# Patient Record
Sex: Female | Born: 1940 | Race: White | Hispanic: No | Smoking: Never smoker
Health system: Southern US, Community
[De-identification: ages and names within clinical notes are randomized; demographics above are authoritative.]

## PROBLEM LIST (undated history)

## (undated) DIAGNOSIS — M129 Arthropathy, unspecified: Secondary | ICD-10-CM

## (undated) DIAGNOSIS — H9190 Unspecified hearing loss, unspecified ear: Secondary | ICD-10-CM

## (undated) DIAGNOSIS — M810 Age-related osteoporosis without current pathological fracture: Secondary | ICD-10-CM

## (undated) DIAGNOSIS — I10 Essential (primary) hypertension: Secondary | ICD-10-CM

## (undated) DIAGNOSIS — F0280 Dementia in other diseases classified elsewhere without behavioral disturbance: Secondary | ICD-10-CM

## (undated) DIAGNOSIS — F32A Depression, unspecified: Secondary | ICD-10-CM

## (undated) DIAGNOSIS — J45909 Unspecified asthma, uncomplicated: Secondary | ICD-10-CM

## (undated) DIAGNOSIS — G309 Alzheimer's disease, unspecified: Secondary | ICD-10-CM

## (undated) DIAGNOSIS — Z973 Presence of spectacles and contact lenses: Secondary | ICD-10-CM

## (undated) DIAGNOSIS — E079 Disorder of thyroid, unspecified: Secondary | ICD-10-CM

## (undated) DIAGNOSIS — I2510 Atherosclerotic heart disease of native coronary artery without angina pectoris: Secondary | ICD-10-CM

## (undated) DIAGNOSIS — E119 Type 2 diabetes mellitus without complications: Secondary | ICD-10-CM

## (undated) DIAGNOSIS — I639 Cerebral infarction, unspecified: Secondary | ICD-10-CM

## (undated) DIAGNOSIS — J449 Chronic obstructive pulmonary disease, unspecified: Secondary | ICD-10-CM

## (undated) DIAGNOSIS — C801 Malignant (primary) neoplasm, unspecified: Secondary | ICD-10-CM

## (undated) HISTORY — PX: WRIST SURGERY: SHX841

## (undated) HISTORY — PX: KIDNEY SURGERY: SHX687

## (undated) HISTORY — PX: HX BACK SURGERY: SHX140

## (undated) HISTORY — PX: HX HYSTERECTOMY: SHX81

## (undated) HISTORY — PX: HX CORONARY ARTERY BYPASS GRAFT: SHX141

## (undated) HISTORY — PX: HX GALL BLADDER SURGERY/CHOLE: SHX55

## (undated) HISTORY — PX: LIVER RESECTION: SHX1977

## (undated) HISTORY — PX: HX CATARACT REMOVAL: SHX102

---

## 1999-05-21 ENCOUNTER — Other Ambulatory Visit (HOSPITAL_COMMUNITY): Payer: Self-pay

## 2002-04-27 ENCOUNTER — Ambulatory Visit (INDEPENDENT_AMBULATORY_CARE_PROVIDER_SITE_OTHER): Payer: Self-pay | Admitting: Rheumatology

## 2002-04-27 DIAGNOSIS — M810 Age-related osteoporosis without current pathological fracture: Secondary | ICD-10-CM | POA: Insufficient documentation

## 2005-08-18 ENCOUNTER — Emergency Department (HOSPITAL_COMMUNITY): Admission: EM | Admit: 2005-08-18 | Discharge: 2005-08-18 | Payer: Self-pay | Admitting: Emergency Medicine

## 2015-08-05 ENCOUNTER — Encounter: Payer: Self-pay | Admitting: *Deleted

## 2015-08-05 ENCOUNTER — Emergency Department
Admission: EM | Admit: 2015-08-05 | Discharge: 2015-08-05 | Disposition: A | Discharge status: Home / Self Care | Payer: Medicare Other | Attending: Emergency Medicine | Admitting: Emergency Medicine

## 2015-08-05 ENCOUNTER — Emergency Department: Payer: Medicare Other

## 2015-08-05 DIAGNOSIS — S20211A Contusion of right front wall of thorax, initial encounter: Secondary | ICD-10-CM | POA: Diagnosis not present

## 2015-08-05 DIAGNOSIS — Y998 Other external cause status: Secondary | ICD-10-CM | POA: Diagnosis not present

## 2015-08-05 DIAGNOSIS — Y9289 Other specified places as the place of occurrence of the external cause: Secondary | ICD-10-CM | POA: Insufficient documentation

## 2015-08-05 DIAGNOSIS — E1165 Type 2 diabetes mellitus with hyperglycemia: Secondary | ICD-10-CM | POA: Insufficient documentation

## 2015-08-05 DIAGNOSIS — Z8661 Personal history of infections of the central nervous system: Secondary | ICD-10-CM | POA: Insufficient documentation

## 2015-08-05 DIAGNOSIS — W01198A Fall on same level from slipping, tripping and stumbling with subsequent striking against other object, initial encounter: Secondary | ICD-10-CM | POA: Insufficient documentation

## 2015-08-05 DIAGNOSIS — R739 Hyperglycemia, unspecified: Secondary | ICD-10-CM

## 2015-08-05 DIAGNOSIS — Y9389 Activity, other specified: Secondary | ICD-10-CM | POA: Insufficient documentation

## 2015-08-05 DIAGNOSIS — S29001A Unspecified injury of muscle and tendon of front wall of thorax, initial encounter: Secondary | ICD-10-CM | POA: Diagnosis present

## 2015-08-05 LAB — COMPREHENSIVE METABOLIC PANEL
ALBUMIN: 3.6 g/dL (ref 3.5–5.0)
ALK PHOS: 77 U/L (ref 38–126)
ALT: 13 U/L — AB (ref 14–54)
AST: 16 U/L (ref 15–41)
Anion gap: 6 (ref 5–15)
BUN: 18 mg/dL (ref 6–20)
CALCIUM: 8.9 mg/dL (ref 8.9–10.3)
CO2: 27 mmol/L (ref 22–32)
CREATININE: 0.89 mg/dL (ref 0.44–1.00)
Chloride: 103 mmol/L (ref 101–111)
GFR calc non Af Amer: >60 mL/min (ref >60)
GLUCOSE: 503 mg/dL — AB (ref 65–99)
Potassium: 4.6 mmol/L (ref 3.5–5.1)
SODIUM: 136 mmol/L (ref 135–145)
Total Bilirubin: 0.4 mg/dL (ref 0.3–1.2)
Total Protein: 6.6 g/dL (ref 6.5–8.1)

## 2015-08-05 LAB — CBC
HEMATOCRIT: 35 % (ref 35.0–47.0)
HEMOGLOBIN: 11.4 g/dL — AB (ref 12.0–16.0)
MCH: 29.4 pg (ref 26.0–34.0)
MCHC: 32.7 g/dL (ref 32.0–36.0)
MCV: 89.9 fL (ref 80.0–100.0)
Platelets: 256 10*3/uL (ref 150–440)
RBC: 3.89 MIL/uL (ref 3.80–5.20)
RDW: 16.3 % — ABNORMAL HIGH (ref 11.5–14.5)
WBC: 4.1 10*3/uL (ref 3.6–11.0)

## 2015-08-05 LAB — TROPONIN I: Troponin I: <0.03 ng/mL (ref <0.031)

## 2015-08-05 MED ORDER — SODIUM CHLORIDE 0.9 % IV BOLUS (SEPSIS)
1000.0000 mL | Freq: Once | INTRAVENOUS | Status: AC
  Administered 2015-08-05: 1000 mL via INTRAVENOUS

## 2015-08-05 NOTE — ED Provider Notes (Signed)
[facility] Emergency Department Provider Note  ____________________________________________  Time seen: 7:30 PM  I have reviewed the triage vital signs and the nursing notes.  History by:  Patient along with her daughter.  HISTORY  Chief Complaint Shortness of Breath and Chest Pain     HPI Jenna Joseph is a 74 y.o. female who presents to the emergency department with complaint of some shortness of breath and discomfort in her right chest when she coughs or sneezes.  The patient reports that her symptoms began earlier today. Her daughter helps clarify that there was some discomfort yesterday as well. The patient tripped recently, 3 or 4 days ago, over an item and landed on her right side onto hard floor. She hit her head as well as her torso. She denies any loss of consciousness. She did not develop a headache. She has not had any acute changes, other than the current discomfort in right chest, in the 4 days since this happened.  The patient is primarily complaining of pain in the right lower chest. This is tender on palpation. Her daughter has blood to the area as well. There is a very small bruise in one area. The patient is on Eliquis due to a DVT after a prolonged hospitalization. The hospitalization was due to encephalopathy secondary to mosquito bite this past summer.  The patient has diabetes. She reports her sugar was high this evening and she gave herself her insulin dose around 5:30.  Lab results, which are a back, shows a glucose of 503.   Past medical history: Encephalopathy this past summer DVT during prolonged hospitalization  There are no active problems to display for this patient.   Surgical history: Cholecystectomy  No current outpatient prescriptions on file.  Allergies Ceclor; Macrodantin; and Sulfa antibiotics  No family history on file.  Social History Social History  Substance Use Topics  . Smoking status: Never Smoker   .  Smokeless tobacco: None  . Alcohol Use: No    Review of Systems  Constitutional: Negative for fever/chills. ENT: Negative for congestion. Cardiovascular: Negative for chest pain. Chest wall: Right-sided chest wall pain. See history of present illness Respiratory: Mild shortness of breath with chest discomfort on right. See history of present illness Gastrointestinal: Negative for abdominal pain, vomiting and diarrhea. Genitourinary: Negative for dysuria. Musculoskeletal: No back pain. Skin: Negative for rash. Neurological: Negative for headache or focal weakness. History of encephalopathy this past summer.   10-point ROS otherwise negative.  ____________________________________________   PHYSICAL EXAM:  VITAL SIGNS: ED Triage Vitals  Enc Vitals Group     BP 08/05/15 1812 124/42 mmHg     Pulse Rate 08/05/15 1812 74     Resp 08/05/15 1812 22     Temp 08/05/15 1812 98.6 F (37 C)     Temp Source 08/05/15 1812 Oral     SpO2 08/05/15 1812 99 %     Weight 08/05/15 1812 122 lb (55.339 kg)     Height 08/05/15 1812 5\' 4"  (1.626 m)     Head Cir --      Peak Flow --      Pain Score 08/05/15 1812 7     Pain Loc --      Pain Edu? --      Excl. in GC? --     Constitutional: Alert and oriented. Well appearing and in no distress. Communicative, but at times slightly tangential. ENT   Head: Normocephalic and atraumatic.   Nose: No congestion/rhinnorhea.  Mouth: No erythema, no swelling   Cardiovascular: Normal rate, regular rhythm, no murmur noted Chest wall: Notable point tenderness in the right lower ribs, in the lateral area. She also has some discomfort in the right anterior chest on palpation. Respiratory:  Normal respiratory effort, no tachypnea.    Breath sounds are clear and equal bilaterally.  Gastrointestinal: Soft, no distention. Nontender Back: No muscle spasm, no tenderness, no CVA tenderness. Musculoskeletal: No deformity noted. Nontender with normal  range of motion in all extremities.  No noted edema. Neurologic:  Communicative. Normal appearing spontaneous movement in all 4 extremities. No gross focal neurologic deficits are appreciated.  Skin:  Skin is warm, dry. No rash noted. Psychiatric: Mood and affect are normal. Speech and behavior are normal.  ____________________________________________    LABS (pertinent positives/negatives)  Labs Reviewed  CBC - Abnormal; Notable for the following:    Hemoglobin 11.4 (*)    RDW 16.3 (*)    All other components within normal limits  COMPREHENSIVE METABOLIC PANEL - Abnormal; Notable for the following:    Glucose, Bld 503 (*)    ALT 13 (*)    All other components within normal limits  TROPONIN I     ____________________________________________   EKG  ED ECG REPORT I, Sanjuan Sawa W, the attending physician, personally viewed and interpreted this ECG.   Date: 08/05/2015  EKG Time: 1826   Rate: 78  Rhythm:  Normal sinus rhythm  Axis: Normal  Intervals: Normal  ST&T Change: None noted   ____________________________________________    RADIOLOGY  Chest x-ray: FINDINGS: Median sternotomy. Heart size is normal. Negative for heart failure. Lungs are clear without infiltrate or effusion. No displaced rib fracture.  IMPRESSION: No active cardiopulmonary disease.   ____________________________________________  ____________________________________________   INITIAL IMPRESSION / ASSESSMENT AND PLAN / ED COURSE  Pertinent labs & imaging results that were available during my care of the patient were reviewed by me and considered in my medical decision making (see chart for details).  Pleasant alert 74 year old female in no acute distress. She complains of some discomfort when she coughs or sneezes. She also is tenderness on palpation of her right chest. I suspect this is due to the fall that occurred 4 days ago. Her chest x-ray is unremarkable with no rib fractures  noted and no pulmonary lesions noted. Her option saturation level and vital signs are excellent. Her lungs are clear to auscultation bilaterally. She is in no acute distress.  She does have an elevated glucose at 503. She gave herself her insulin at home prior to arrival. Her sugars apparently go up and down fairly rapidly. I do not think it's appropriate for rest to chase this further, except to give her 1 L of normal saline to help dilute down the sugar. I counseled her to continue with her insulin and to call her primary physician for further guidance in the next 1-2 days, depending on office hours.  The daughter with the patient is very pleasant, knowledgeable, and appreciative of the care provided and they both, the daughter and patient, agree with her current plan. We did discuss the pros and cons of getting a CT scan of her head. Given the fact that she had no loss of consciousness and no symptoms, 4 days later, I do not see an indication for a CT scan that would change the clinical outcome. I've also counseled the patient further on her use of Eliquis. I've asked her to follow with her regular doctor to determine  when is inappropriate stopping point given that the treatment was for a DVT that was experienced during a prolonged hospitalization.  ____________________________________________   FINAL CLINICAL IMPRESSION(S) / ED DIAGNOSES  Final diagnoses:  Chest wall contusion, right, initial encounter  Hyperglycemia      Darien Ramus, MD 08/05/15 2008

## 2015-08-05 NOTE — ED Notes (Addendum)
Pt ambulatory to triage.  Pt has sob and chest pain.  Sx for 1 day.  No n/v/d. No diaphoresis.  Pt fell 4 days ago after tripping over a bag onto the floor. No loc.  Pt has a bruise to right side of face. Pt is on a blood thinner.  Pt also reports pain in right ribs .  No bruising   States it hurts to take a deep breath.  Pt alert.  Speech clear.

## 2015-08-05 NOTE — Discharge Instructions (Signed)
Chest Contusion A chest contusion is a deep bruise on your chest area. Contusions are the result of an injury that caused bleeding under the skin. A chest contusion may involve bruising of the skin, muscles, or ribs. The contusion may turn blue, purple, or yellow. Minor injuries will give you a painless contusion, but more severe contusions may stay painful and swollen for a few weeks. CAUSES  A contusion is usually caused by a blow, trauma, or direct force to an area of the body. SYMPTOMS   Swelling and redness of the injured area.  Discoloration of the injured area.  Tenderness and soreness of the injured area.  Pain. DIAGNOSIS  The diagnosis can be made by taking a history and performing a physical exam. An X-ray, CT scan, or MRI may be needed to determine if there were any associated injuries, such as broken bones (fractures) or internal injuries. TREATMENT  Often, the best treatment for a chest contusion is resting, icing, and applying cold compresses to the injured area. Deep breathing exercises may be recommended to reduce the risk of pneumonia. Over-the-counter medicines may also be recommended for pain control. HOME CARE INSTRUCTIONS   Put ice on the injured area.  Put ice in a plastic bag.  Place a towel between your skin and the bag.  Leave the ice on for 15-20 minutes, 03-04 times a day.  Only take over-the-counter or prescription medicines as directed by your caregiver. Your caregiver may recommend avoiding anti-inflammatory medicines (aspirin, ibuprofen, and naproxen) for 48 hours because these medicines may increase bruising.  Rest the injured area.  Perform deep-breathing exercises as directed by your caregiver.  Stop smoking if you smoke.  Do not lift objects over 5 pounds (2.3 kg) for 3 days or longer if recommended by your caregiver. SEEK IMMEDIATE MEDICAL CARE IF:   You have increased bruising or swelling.  You have pain that is getting worse.  You have  difficulty breathing.  You have dizziness, weakness, or fainting.  You have blood in your urine or stool.  You cough up or vomit blood.  Your swelling or pain is not relieved with medicines. MAKE SURE YOU:   Understand these instructions.  Will watch your condition.  Will get help right away if you are not doing well or get worse.   This information is not intended to replace advice given to you by your health care provider. Make sure you discuss any questions you have with your health care provider.   Document Released: 04/22/2001 Document Revised: 04/21/2012 Document Reviewed: 01/19/2012 Elsevier Interactive Patient Education 2016 [employer]  

## 2015-08-05 NOTE — ED Notes (Signed)
Per dr Carollee Massedkaminski, no ct scan at this time.

## 2017-04-14 ENCOUNTER — Ambulatory Visit (HOSPITAL_COMMUNITY): Payer: Self-pay | Admitting: Gastroenterology

## 2017-07-17 IMAGING — CR DG CHEST 2V
2 series · 2 of 2 positions shown · non-contrast
Comparison: None.

CLINICAL DATA: Fall 07/23/2015.  Chest pain

EXAM:
CHEST  2 VIEW

[chest pa]
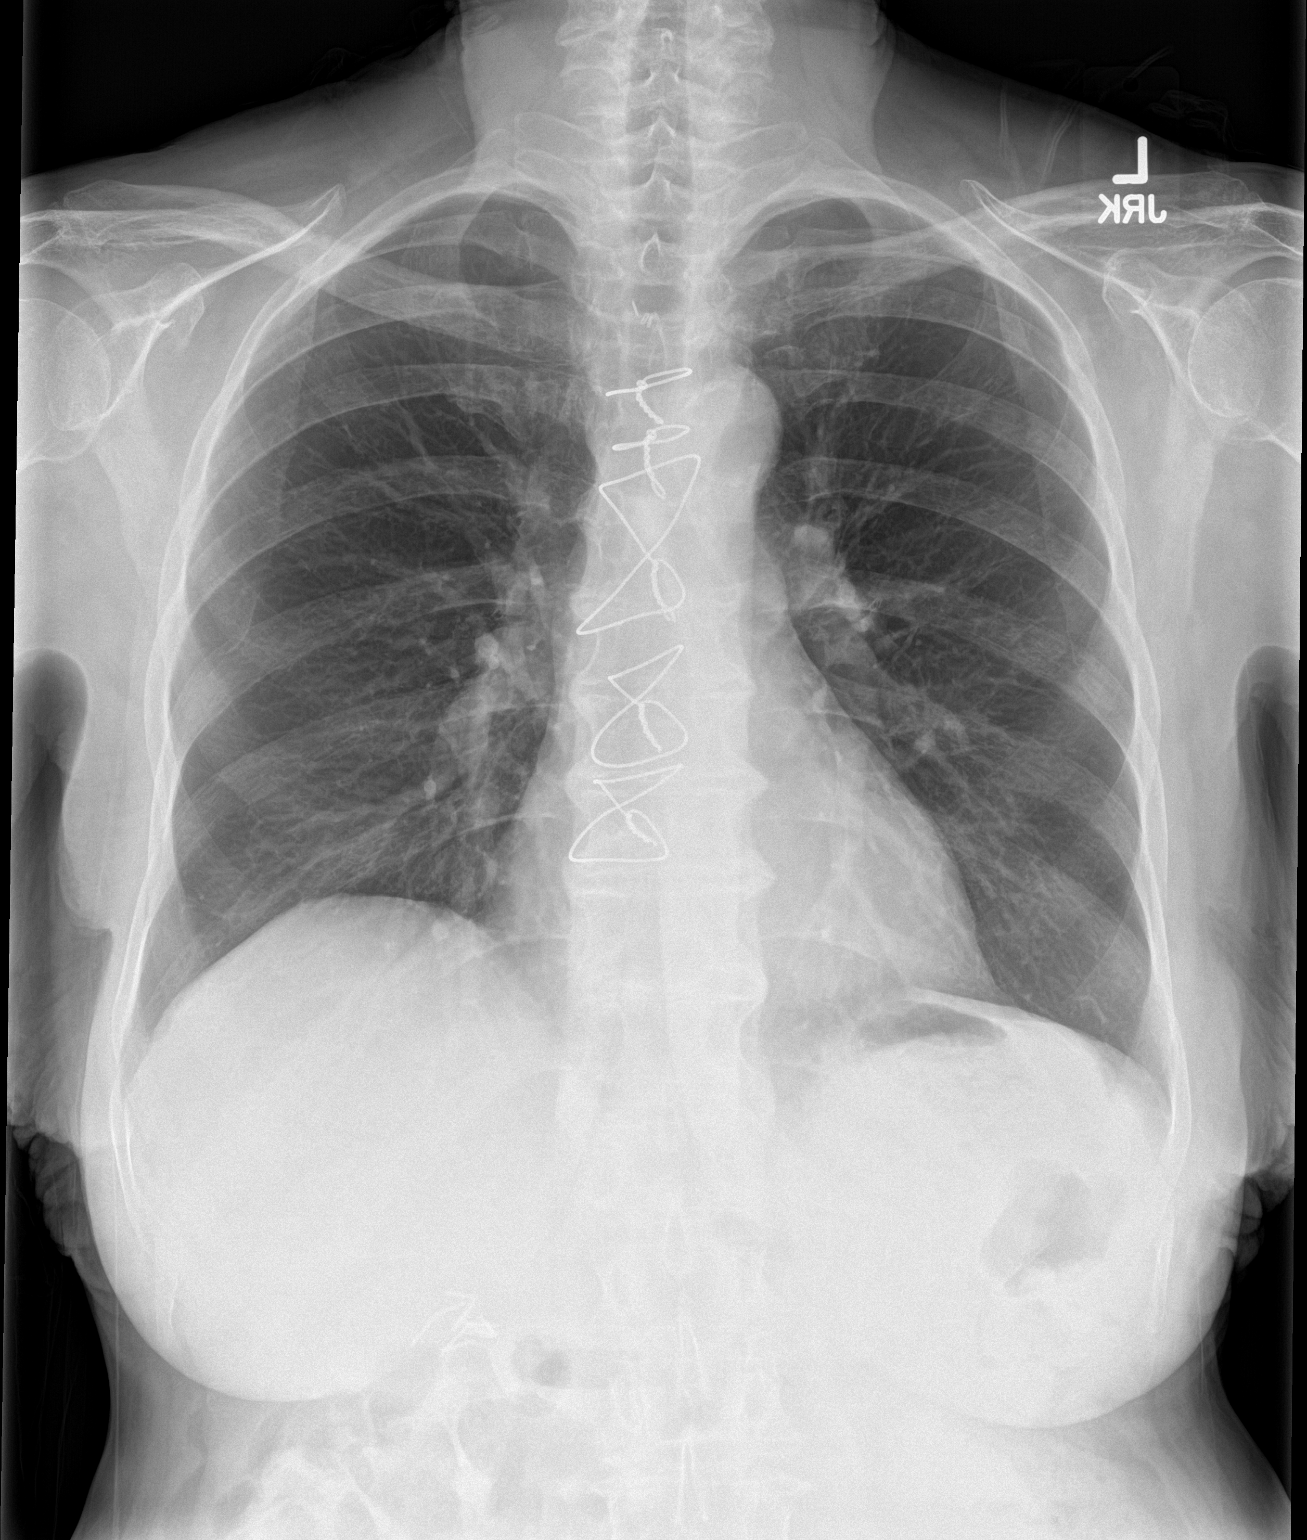

[chest lat]
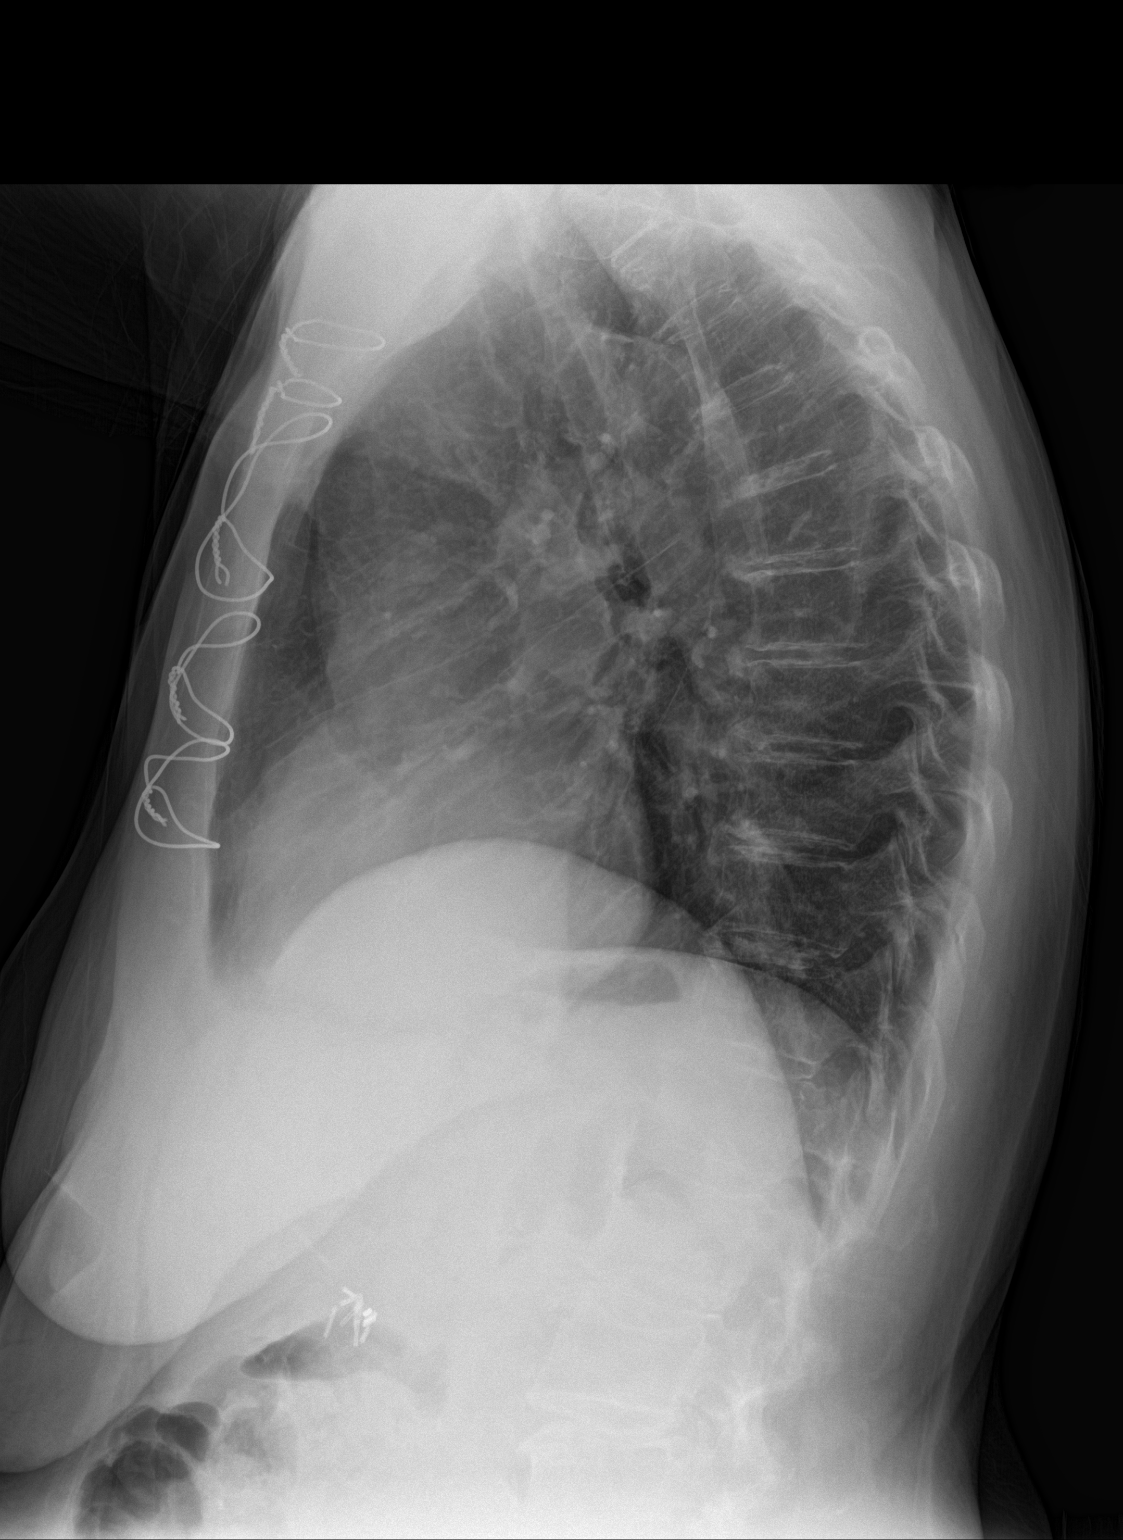

[2 of 2 positions shown; findings below may reference images not displayed]

FINDINGS: Median sternotomy. Heart size is normal. Negative for heart failure.
Lungs are clear without infiltrate or effusion. No displaced rib
fracture.
IMPRESSION: No active cardiopulmonary disease.

## 2020-10-31 DIAGNOSIS — E039 Hypothyroidism, unspecified: Secondary | ICD-10-CM | POA: Insufficient documentation

## 2020-10-31 DIAGNOSIS — E559 Vitamin D deficiency, unspecified: Secondary | ICD-10-CM | POA: Insufficient documentation

## 2020-10-31 DIAGNOSIS — E119 Type 2 diabetes mellitus without complications: Secondary | ICD-10-CM | POA: Insufficient documentation

## 2020-10-31 DIAGNOSIS — E1165 Type 2 diabetes mellitus with hyperglycemia: Secondary | ICD-10-CM | POA: Insufficient documentation

## 2021-10-29 ENCOUNTER — Encounter (HOSPITAL_BASED_OUTPATIENT_CLINIC_OR_DEPARTMENT_OTHER): Payer: Self-pay

## 2021-10-29 ENCOUNTER — Emergency Department (EMERGENCY_DEPARTMENT_HOSPITAL): Payer: Medicare Other

## 2021-10-29 ENCOUNTER — Emergency Department
Admission: EM | Admit: 2021-10-29 | Discharge: 2021-10-29 | Disposition: A | Discharge status: Home / Self Care | Payer: Medicare Other | Attending: Emergency Medicine | Admitting: Emergency Medicine

## 2021-10-29 ENCOUNTER — Other Ambulatory Visit: Payer: Self-pay

## 2021-10-29 DIAGNOSIS — M461 Sacroiliitis, not elsewhere classified: Secondary | ICD-10-CM | POA: Insufficient documentation

## 2021-10-29 DIAGNOSIS — M160 Bilateral primary osteoarthritis of hip: Secondary | ICD-10-CM

## 2021-10-29 DIAGNOSIS — M7918 Myalgia, other site: Secondary | ICD-10-CM

## 2021-10-29 DIAGNOSIS — M25552 Pain in left hip: Secondary | ICD-10-CM

## 2021-10-29 DIAGNOSIS — M8580 Other specified disorders of bone density and structure, unspecified site: Secondary | ICD-10-CM | POA: Insufficient documentation

## 2021-10-29 DIAGNOSIS — M169 Osteoarthritis of hip, unspecified: Secondary | ICD-10-CM

## 2021-10-29 HISTORY — DX: Alzheimer's disease, unspecified (CMS HCC): G30.9

## 2021-10-29 HISTORY — DX: Cerebral infarction, unspecified: I63.9

## 2021-10-29 HISTORY — DX: Dementia in other diseases classified elsewhere, unspecified severity, without behavioral disturbance, psychotic disturbance, mood disturbance, and anxiety: F02.80

## 2021-10-29 HISTORY — DX: Unspecified hearing loss, unspecified ear: H91.90

## 2021-10-29 HISTORY — DX: Atherosclerotic heart disease of native coronary artery without angina pectoris: I25.10

## 2021-10-29 HISTORY — DX: Type 2 diabetes mellitus without complications: E11.9

## 2021-10-29 LAB — URINALYSIS, MACRO/MICRO
BILIRUBIN: NEGATIVE mg/dL
BLOOD: NEGATIVE mg/dL
GLUCOSE: NEGATIVE mg/dL
KETONES: NEGATIVE mg/dL
LEUKOCYTES: NEGATIVE WBCs/uL
NITRITE: NEGATIVE
PH: 7.5 (ref 4.6–8.0)
PROTEIN: NEGATIVE mg/dL
SPECIFIC GRAVITY: 1.01 (ref 1.003–1.035)
UROBILINOGEN: 0.2 mg/dL (ref 0.2–1.0)

## 2021-10-29 MED ORDER — LIDOCAINE 4 % TOPICAL PATCH
1.0000 | MEDICATED_PATCH | CUTANEOUS | Status: DC
Start: 2021-10-29 — End: 2021-10-29
  Administered 2021-10-29: 1 via TRANSDERMAL

## 2021-10-29 MED ORDER — NAPROXEN 500 MG TABLET
500.0000 mg | ORAL_TABLET | Freq: Two times a day (BID) | ORAL | 0 refills | Status: DC
Start: 2021-10-29 — End: 2021-12-16

## 2021-10-29 MED ORDER — MORPHINE 4 MG/ML INJECTION WRAPPER
INJECTION | INTRAMUSCULAR | Status: AC
Start: 2021-10-29 — End: 2021-10-29
  Filled 2021-10-29: qty 1

## 2021-10-29 MED ORDER — LIDOCAINE 4 % TOPICAL PATCH
MEDICATED_PATCH | CUTANEOUS | Status: AC
Start: 2021-10-29 — End: 2021-10-29
  Filled 2021-10-29: qty 1

## 2021-10-29 MED ORDER — PREDNISONE 50 MG TABLET
50.0000 mg | ORAL_TABLET | Freq: Every day | ORAL | 0 refills | Status: DC
Start: 2021-10-29 — End: 2021-11-27

## 2021-10-29 MED ORDER — MORPHINE 4 MG/ML INJECTION WRAPPER
4.0000 mg | INJECTION | INTRAMUSCULAR | Status: AC
Start: 2021-10-29 — End: 2021-10-29
  Administered 2021-10-29: 4 mg via INTRAMUSCULAR

## 2021-10-29 NOTE — ED Nurses Note (Signed)
Pt reports and points to left buttocks when describing pain. 10/10. States she did not fall. Reports she had x-ray at [facility] a "few" days ago and it showed arthritis.

## 2021-10-29 NOTE — ED Attending Note (Signed)
[place] emergency department         HISTORY OF PRESENT ILLNESS     Date:  10/29/2021  Patient's Name:  Jordan Buckley  Date of Birth:  09-14-1940    Patient is a 81 year old presenting with left buttock left hip pain onset over the last week.  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  Patient denies any recent fall or injury.  Patient with severe pain involving the left buttock left hip area radiating down her left leg.  No fever no chills.  There has been no nausea or vomiting no dysuria          Review of Systems     Review of Systems   Constitutional: Negative.    HENT: Negative.    Eyes: Negative.    Respiratory: Negative.    Cardiovascular: Negative.    Musculoskeletal: Positive for back pain.   Skin: Negative.    Neurological: Negative.    Hematological: Negative.    Psychiatric/Behavioral: Negative.        Previous History     Past Medical History:  Past Medical History:   Diagnosis Date   . Alzheimer's dementia (CMS [place])    . Coronary artery disease    . CVA (cerebrovascular accident) (CMS [place])    . Diabetes mellitus, type 2 (CMS HCC)    . Hard of hearing        Past Surgical History:  No past surgical history on file.    Social History:     Social History     Substance and Sexual Activity   Drug Use Not on file       Family History:  No family history on file.    Medication History:  Current Outpatient Medications   Medication Sig   . naproxen (NAPROSYN) 500 mg Oral Tablet Take 1 Tablet (500 mg total) by mouth Twice daily with food   . predniSONE (DELTASONE) 50 mg Oral Tablet Take 1 Tablet (50 mg total) by mouth Once a day for 5 days       Allergies:  Allergies   Allergen Reactions   . Cefaclor Rash   . Nitrofurantoin Rash   . Quinine Rash   . Sulfa (Sulfonamides) Rash       Physical Exam     Vitals:    BP 139/89   Pulse 63   Temp 36.6 C (97.9 F)   Resp 17   Ht 1.626 m ('5\' 4"'$ )   Wt 61.2 kg (135 lb)   SpO2 93%   BMI 23.17  kg/m           Physical Exam  Vitals and nursing note reviewed.   Constitutional:       General: She is not in acute distress.     Appearance: She is well-developed.   HENT:      Head: Normocephalic and atraumatic.   Eyes:      Conjunctiva/sclera: Conjunctivae normal.   Cardiovascular:      Rate and Rhythm: Normal rate and regular rhythm.      Heart sounds: No murmur heard.  Pulmonary:      Effort: Pulmonary effort is normal. No respiratory distress.      Breath sounds: Normal breath sounds.   Abdominal:      Palpations: Abdomen is soft.      Tenderness: There is no abdominal tenderness.   Musculoskeletal:         General:  No swelling. Normal range of motion.      Cervical back: Neck supple.      Comments: Tenderness left buttock with full range of motion left hip   Skin:     General: Skin is warm and dry.      Capillary Refill: Capillary refill takes less than 2 seconds.   Neurological:      Mental Status: She is alert.   Psychiatric:         Mood and Affect: Mood normal.         Diagnostic Studies/Treatment     Medications:  Medications Administered in the ED   lidocaine 4% patch (1 Patch Transdermal Patch Applied 10/29/21 0429)   morphine 4 mg/mL injection (4 mg IntraMUSCULAR Given 10/29/21 0430)       New Prescriptions    NAPROXEN (NAPROSYN) 500 MG ORAL TABLET    Take 1 Tablet (500 mg total) by mouth Twice daily with food    PREDNISONE (DELTASONE) 50 MG ORAL TABLET    Take 1 Tablet (50 mg total) by mouth Once a day for 5 days       Labs:    Results for orders placed or performed during the hospital encounter of 10/29/21 (from the past 12 hour(s))   URINALYSIS, MACRO/MICRO   Result Value Ref Range    COLOR Light Yellow Yellow, Colorless, Light Yellow, Dark Yellow    APPEARANCE Clear Clear    SPECIFIC GRAVITY 1.010 1.003 - 1.035    PH 7.5 4.6 - 8.0    LEUKOCYTES Negative Negative WBCs/uL    NITRITE Negative Negative    PROTEIN Negative Negative mg/dL    GLUCOSE Negative Negative mg/dL    KETONES Negative Negative  mg/dL    BILIRUBIN Negative Negative mg/dL    BLOOD Negative Negative mg/dL    UROBILINOGEN 0.2 0.2 - 1.0 mg/dL        Radiology:  CT PELVIS WO IV CONTRAST  No fracture dislocation degenerative changes bilateral hips  CT PELVIS WO IV CONTRAST   Final Result          ECG:  NONE            Differential diagnosis  Left hip bursitis, contusion left hip, degenerative arthritis pelvis    Course/Disposition/Plan     Course:     patient pain much improved after IM morphine.  Patient will be discharged home she will need follow-up with orthopedics if discomfort continues    Disposition:    Discharged    Condition at Disposition:      Stable  Follow up:   Lerry Paterson, [address]  [place] [id number]  [phone]    Schedule an appointment as soon as possible for a visit in 2 days  If symptoms worsen      Clinical Impression:     Clinical Impression   Sacroiliitis (CMS [place]) (Primary)   Degenerative arthritis of hip         Winfred Burn, MD

## 2021-10-29 NOTE — ED Nurses Note (Signed)
Pt ambulated to restroom with assistance, unsteady gait noted, pt reports increased left buttock/hip pain with ambulation

## 2021-10-29 NOTE — ED Nurses Note (Signed)
Rx called into [facility] per pt wrong pharmacy choose daughter informed also pt wears O2 at 2 prn, her sat was good enough on room air, to go home without O2.

## 2021-10-29 NOTE — ED Triage Notes (Signed)
Per EMS, patient c/o left hip pain for the past 2 days. Patient denies injury. Patient reports that she was unable to bear weight on the leg this morning.

## 2021-11-12 ENCOUNTER — Other Ambulatory Visit (HOSPITAL_COMMUNITY): Payer: Self-pay | Admitting: NURSE PRACTITIONER

## 2021-11-12 DIAGNOSIS — M545 Low back pain, unspecified: Secondary | ICD-10-CM

## 2021-11-19 ENCOUNTER — Inpatient Hospital Stay
Admission: RE | Admit: 2021-11-19 | Discharge: 2021-11-19 | Disposition: A | Discharge status: Home / Self Care | Payer: Medicare Other | Source: Ambulatory Visit | Attending: NURSE PRACTITIONER | Admitting: NURSE PRACTITIONER

## 2021-11-19 ENCOUNTER — Other Ambulatory Visit: Payer: Self-pay

## 2021-11-19 DIAGNOSIS — M545 Low back pain, unspecified: Secondary | ICD-10-CM | POA: Insufficient documentation

## 2021-11-21 ENCOUNTER — Other Ambulatory Visit (HOSPITAL_COMMUNITY): Payer: Self-pay | Admitting: NURSE PRACTITIONER

## 2021-11-21 DIAGNOSIS — S22080A Wedge compression fracture of T11-T12 vertebra, initial encounter for closed fracture: Secondary | ICD-10-CM

## 2021-11-26 ENCOUNTER — Other Ambulatory Visit (HOSPITAL_COMMUNITY): Payer: Self-pay | Admitting: NURSE PRACTITIONER

## 2021-11-26 DIAGNOSIS — M8008XA Age-related osteoporosis with current pathological fracture, vertebra(e), initial encounter for fracture: Secondary | ICD-10-CM

## 2021-11-26 DIAGNOSIS — S22080A Wedge compression fracture of T11-T12 vertebra, initial encounter for closed fracture: Secondary | ICD-10-CM

## 2021-11-27 ENCOUNTER — Other Ambulatory Visit: Payer: Self-pay

## 2021-11-27 ENCOUNTER — Encounter (HOSPITAL_COMMUNITY): Payer: Self-pay

## 2021-11-27 ENCOUNTER — Inpatient Hospital Stay
Admission: RE | Admit: 2021-11-27 | Discharge: 2021-11-27 | Disposition: A | Discharge status: Home / Self Care | Payer: Medicare Other | Source: Ambulatory Visit | Attending: NURSE PRACTITIONER | Admitting: NURSE PRACTITIONER

## 2021-11-27 DIAGNOSIS — M8008XA Age-related osteoporosis with current pathological fracture, vertebra(e), initial encounter for fracture: Secondary | ICD-10-CM | POA: Insufficient documentation

## 2021-11-27 DIAGNOSIS — S22080A Wedge compression fracture of T11-T12 vertebra, initial encounter for closed fracture: Secondary | ICD-10-CM | POA: Insufficient documentation

## 2021-11-27 HISTORY — PX: FIXATION KYPHOPLASTY: SHX860

## 2021-11-27 HISTORY — DX: Age-related osteoporosis without current pathological fracture: M81.0

## 2021-11-27 MED ORDER — LEVOFLOXACIN 500 MG/100 ML IN 5 % DEXTROSE INTRAVENOUS PIGGYBACK
INJECTION | INTRAVENOUS | Status: AC
Start: 2021-11-27 — End: 2021-11-27
  Filled 2021-11-27: qty 100

## 2021-11-27 MED ORDER — IOHEXOL 300 MG IODINE/ML INTRAVENOUS SOLUTION
50.0000 mL | INTRAVENOUS | Status: AC
Start: 2021-11-27 — End: 2021-11-27
  Administered 2021-11-27: 50 mL via INTRAMUSCULAR

## 2021-11-27 MED ORDER — HYDROCODONE 7.5 MG-ACETAMINOPHEN 325 MG TABLET
1.0000 | ORAL_TABLET | Freq: Four times a day (QID) | ORAL | Status: DC | PRN
Start: 2021-11-27 — End: 2021-11-28
  Administered 2021-11-27: 1 via ORAL
  Filled 2021-11-27: qty 1

## 2021-11-27 MED ORDER — MIDAZOLAM 5 MG/ML INJECTION WRAPPER
Freq: Once | INTRAMUSCULAR | Status: AC | PRN
Start: 2021-11-27 — End: 2021-11-27
  Administered 2021-11-27 (×2): 1 mg via INTRAVENOUS

## 2021-11-27 MED ORDER — MIDAZOLAM 5 MG/ML INJECTION WRAPPER
INTRAMUSCULAR | Status: AC
Start: 2021-11-27 — End: 2021-11-27
  Filled 2021-11-27: qty 2

## 2021-11-27 MED ORDER — LIDOCAINE (PF) 20 MG/ML (2 %) INJECTION SOLUTION
INTRAMUSCULAR | Status: AC
Start: 2021-11-27 — End: 2021-11-27
  Filled 2021-11-27: qty 10

## 2021-11-27 MED ORDER — FENTANYL (PF) 50 MCG/ML INJECTION WRAPPER
INJECTION | Freq: Once | INTRAMUSCULAR | Status: AC | PRN
Start: 2021-11-27 — End: 2021-11-27
  Administered 2021-11-27 (×4): 20 ug via INTRAVENOUS

## 2021-11-27 MED ORDER — LEVOFLOXACIN 500 MG/100 ML IN 5 % DEXTROSE INTRAVENOUS PIGGYBACK
500.0000 mg | INJECTION | INTRAVENOUS | Status: DC
Start: 2021-11-27 — End: 2021-11-28
  Administered 2021-11-27: 500 mg via INTRAVENOUS

## 2021-11-27 MED ORDER — FENTANYL (PF) 50 MCG/ML INJECTION SOLUTION
INTRAMUSCULAR | Status: AC
Start: 2021-11-27 — End: 2021-11-27
  Filled 2021-11-27: qty 4

## 2021-11-27 MED ORDER — DEXTROSE 5 % IN WATER (D5W) INTRAVENOUS SOLUTION
INTRAVENOUS | Status: DC
Start: 2021-11-27 — End: 2021-11-28

## 2021-11-27 MED ORDER — SODIUM CHLORIDE 0.9 % INTRAVENOUS SOLUTION
INTRAVENOUS | Status: DC
Start: 2021-11-27 — End: 2021-11-27

## 2021-11-27 MED ORDER — LIDOCAINE HCL 20 MG/ML (2 %) INJECTION SOLUTION
Freq: Once | INTRAMUSCULAR | Status: AC | PRN
Start: 2021-11-27 — End: 2021-11-27
  Administered 2021-11-27: 10 mL via INTRADERMAL

## 2021-11-27 MED ORDER — MIDAZOLAM 5 MG/ML INJECTION WRAPPER
Freq: Once | INTRAMUSCULAR | Status: AC | PRN
Start: 2021-11-27 — End: 2021-11-27
  Administered 2021-11-27: 1 mg via NASAL

## 2021-11-27 NOTE — Nurses Notes (Signed)
1400 Dr Harolyn Rutherford came to the floor. The pt got up with her walker and  ambulated down the hallway. She ambulated without any difficulty. He stated that she could be discharged to home and that she can resume all of her home medicine. States she has no pain in her back. States all of her pain is in her right shoulder. States nothing helps it. States she is supposed to have surgery.

## 2021-11-27 NOTE — Brief Op Note (Signed)
11/27/21      Radiologist: Dr. 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

## 2021-11-27 NOTE — OR PostOp (Signed)
1135 Denies any numbness or tingilng. Feet cool to touch. Pt and daughters state that she is always cold. Warm blankets placed on pt. Strong pedal pulses palpated. States her pain is in her shoulder. States she has a torn rotator cuff and her pain is from it. States nothing helps.Radial pulse palpated.  1345 When questioned states the pain pill did not help the shoulder pain at all.

## 2021-11-27 NOTE — Discharge Instructions (Signed)
Follow up with your medical dr    If any questions or concerns call your medical Dr    Resume home medicines as before surgery    Watch for signs of infection, temp greater than 100.4, any foul smell, any unusual color drainage    Do not do any heavy lifting    Do not bend

## 2021-11-28 LAB — SURGICAL PATHOLOGY SPECIMEN

## 2021-12-03 ENCOUNTER — Other Ambulatory Visit: Payer: Self-pay

## 2021-12-03 ENCOUNTER — Emergency Department
Admission: EM | Admit: 2021-12-03 | Discharge: 2021-12-03 | Disposition: A | Discharge status: Home / Self Care | Payer: Medicare Other | Attending: Physician Assistant | Admitting: Physician Assistant

## 2021-12-03 ENCOUNTER — Emergency Department (HOSPITAL_COMMUNITY): Payer: Medicare Other

## 2021-12-03 ENCOUNTER — Encounter (HOSPITAL_COMMUNITY): Payer: Self-pay

## 2021-12-03 DIAGNOSIS — R1084 Generalized abdominal pain: Secondary | ICD-10-CM | POA: Insufficient documentation

## 2021-12-03 DIAGNOSIS — J449 Chronic obstructive pulmonary disease, unspecified: Secondary | ICD-10-CM | POA: Insufficient documentation

## 2021-12-03 DIAGNOSIS — Z8673 Personal history of transient ischemic attack (TIA), and cerebral infarction without residual deficits: Secondary | ICD-10-CM | POA: Insufficient documentation

## 2021-12-03 DIAGNOSIS — K529 Noninfective gastroenteritis and colitis, unspecified: Secondary | ICD-10-CM | POA: Insufficient documentation

## 2021-12-03 DIAGNOSIS — Z9981 Dependence on supplemental oxygen: Secondary | ICD-10-CM | POA: Insufficient documentation

## 2021-12-03 DIAGNOSIS — G309 Alzheimer's disease, unspecified: Secondary | ICD-10-CM | POA: Insufficient documentation

## 2021-12-03 DIAGNOSIS — R110 Nausea: Secondary | ICD-10-CM | POA: Insufficient documentation

## 2021-12-03 DIAGNOSIS — E119 Type 2 diabetes mellitus without complications: Secondary | ICD-10-CM | POA: Insufficient documentation

## 2021-12-03 DIAGNOSIS — R109 Unspecified abdominal pain: Secondary | ICD-10-CM

## 2021-12-03 DIAGNOSIS — F0280 Dementia in other diseases classified elsewhere without behavioral disturbance: Secondary | ICD-10-CM | POA: Insufficient documentation

## 2021-12-03 DIAGNOSIS — I729 Aneurysm of unspecified site: Secondary | ICD-10-CM | POA: Insufficient documentation

## 2021-12-03 HISTORY — DX: Presence of spectacles and contact lenses: Z97.3

## 2021-12-03 HISTORY — DX: Malignant (primary) neoplasm, unspecified: C80.1

## 2021-12-03 HISTORY — DX: Disorder of thyroid, unspecified: E07.9

## 2021-12-03 HISTORY — DX: Essential (primary) hypertension: I10

## 2021-12-03 HISTORY — DX: Arthropathy, unspecified: M12.9

## 2021-12-03 LAB — COMPREHENSIVE METABOLIC PANEL, NON-FASTING
ALBUMIN/GLOBULIN RATIO: 1.4 (ref 0.8–1.4)
ALBUMIN: 3.8 g/dL (ref 3.5–5.7)
ALKALINE PHOSPHATASE: 56 U/L (ref 34–104)
ALT (SGPT): 7 U/L (ref 7–52)
ANION GAP: 7 mmol/L — ABNORMAL LOW (ref 10–20)
AST (SGOT): 11 U/L — ABNORMAL LOW (ref 13–39)
BILIRUBIN TOTAL: 0.5 mg/dL (ref 0.3–1.2)
BUN/CREA RATIO: 25 — ABNORMAL HIGH (ref 6–22)
BUN: 22 mg/dL (ref 7–25)
CALCIUM, CORRECTED: 9.3 mg/dL (ref 8.9–10.8)
CALCIUM: 9.1 mg/dL (ref 8.6–10.3)
CHLORIDE: 100 mmol/L (ref 98–107)
CO2 TOTAL: 29 mmol/L (ref 21–31)
CREATININE: 0.87 mg/dL (ref 0.60–1.30)
ESTIMATED GFR: 67 mL/min/{1.73_m2} (ref >59)
GLOBULIN: 2.8 — ABNORMAL LOW (ref 2.9–5.4)
GLUCOSE: 263 mg/dL — ABNORMAL HIGH (ref 74–109)
OSMOLALITY, CALCULATED: 285 mOsm/kg (ref 270–290)
POTASSIUM: 4.2 mmol/L (ref 3.5–5.1)
PROTEIN TOTAL: 6.6 g/dL (ref 6.4–8.9)
SODIUM: 136 mmol/L (ref 136–145)

## 2021-12-03 LAB — URINALYSIS, MACROSCOPIC
BILIRUBIN: NEGATIVE mg/dL
BLOOD: NEGATIVE mg/dL
GLUCOSE: 500 mg/dL — AB
KETONES: NEGATIVE mg/dL
LEUKOCYTES: NEGATIVE WBCs/uL
NITRITE: NEGATIVE
PH: 7.5 (ref 5.0–9.0)
PROTEIN: NEGATIVE mg/dL
SPECIFIC GRAVITY: 1.013 (ref 1.002–1.030)
UROBILINOGEN: NORMAL mg/dL

## 2021-12-03 LAB — CBC WITH DIFF
BASOPHIL #: 0.1 10*3/uL (ref 0.00–0.30)
BASOPHIL %: 1 % (ref 0–3)
EOSINOPHIL #: 0.2 10*3/uL (ref 0.00–0.80)
EOSINOPHIL %: 3 % (ref 0–7)
HCT: 33.5 % — ABNORMAL LOW (ref 37.0–47.0)
HGB: 11.4 g/dL — ABNORMAL LOW (ref 12.5–16.0)
LYMPHOCYTE #: 1.1 10*3/uL (ref 1.10–5.00)
LYMPHOCYTE %: 18 % — ABNORMAL LOW (ref 25–45)
MCH: 30.8 pg (ref 27.0–32.0)
MCHC: 34.1 g/dL (ref 32.0–36.0)
MCV: 90.5 fL (ref 78.0–99.0)
MONOCYTE #: 0.3 10*3/uL (ref 0.00–1.30)
MONOCYTE %: 5 % (ref 0–12)
MPV: 6.4 fL — ABNORMAL LOW (ref 7.4–10.4)
NEUTROPHIL #: 4.3 10*3/uL (ref 1.80–8.40)
NEUTROPHIL %: 73 % (ref 40–76)
PLATELETS: 363 10*3/uL (ref 140–440)
RBC: 3.71 10*6/uL — ABNORMAL LOW (ref 4.20–5.40)
RDW: 13.9 % (ref 11.6–14.8)
WBC: 5.8 10*3/uL (ref 4.0–10.5)
WBCS UNCORRECTED: 5.8 10*3/uL

## 2021-12-03 LAB — URINALYSIS, MICROSCOPIC
RBCS: 5 /hpf — ABNORMAL HIGH (ref <4)
SQUAMOUS EPITHELIAL: 1 /hpf (ref <28)
WBCS: 1 /hpf (ref <6)

## 2021-12-03 LAB — C-REACTIVE PROTEIN (CRP): C-REACTIVE PROTEIN (CRP): 0.5 mg/dL (ref 0.1–0.5)

## 2021-12-03 LAB — LACTIC ACID LEVEL W/ REFLEX FOR LEVEL >2.0: LACTIC ACID: 1.1 mmol/L (ref 0.5–2.2)

## 2021-12-03 MED ORDER — ONDANSETRON 4 MG DISINTEGRATING TABLET
4.0000 mg | ORAL_TABLET | ORAL | Status: AC
Start: 2021-12-03 — End: 2021-12-03
  Administered 2021-12-03: 4 mg via ORAL

## 2021-12-03 MED ORDER — KETOROLAC 30 MG/ML (1 ML) INJECTION SOLUTION
INTRAMUSCULAR | Status: AC
Start: 2021-12-03 — End: 2021-12-03
  Filled 2021-12-03: qty 1

## 2021-12-03 MED ORDER — ONDANSETRON 4 MG DISINTEGRATING TABLET
ORAL_TABLET | ORAL | Status: AC
Start: 2021-12-03 — End: 2021-12-03
  Filled 2021-12-03: qty 1

## 2021-12-03 MED ORDER — ONDANSETRON 4 MG DISINTEGRATING TABLET
4.0000 mg | ORAL_TABLET | Freq: Three times a day (TID) | ORAL | 0 refills | Status: AC | PRN
Start: 2021-12-03 — End: ?

## 2021-12-03 MED ORDER — AMOXICILLIN 875 MG-POTASSIUM CLAVULANATE 125 MG TABLET
1.0000 | ORAL_TABLET | Freq: Two times a day (BID) | ORAL | 0 refills | Status: DC
Start: 2021-12-03 — End: 2021-12-16

## 2021-12-03 MED ORDER — KETOROLAC 30 MG/ML (1 ML) INJECTION SOLUTION
15.0000 mg | INTRAMUSCULAR | Status: AC
Start: 2021-12-03 — End: 2021-12-03
  Administered 2021-12-03: 15 mg via INTRAMUSCULAR

## 2021-12-03 NOTE — ED APP Handoff Note (Signed)
[facility]  Emergency Department  Course Note    Care/report received from [facility] at  06:10.  Per report:  Jordan Buckley is a 81 y.o. female who had concerns including Abdominal Pain, Back Pain, and Urinary Pain.  Patient presented to the emergency room for evaluation of abdominal pain radiating to the back.     Patient is an 81 year old female past medical history of CVA Alzheimer's type 2 diabetes mellitus presents to the ED with complaints of lower abdominal pain.  Jordan Buckley is accompanied by daughter who contributes to the history.  States he has been having nausea and diarrhea since Saturday eating at the [employer].  Jordan Buckley denies fevers or chills Jordan Buckley does endorse dysuria.  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      Pending labs/imaging/consults:   Results for orders placed or performed during the hospital encounter of 12/03/21 (from the past 12 hour(s))   COMPREHENSIVE METABOLIC PANEL, NON-FASTING   Result Value Ref Range    SODIUM 136 136 - 145 mmol/L    POTASSIUM 4.2 3.5 - 5.1 mmol/L    CHLORIDE 100 98 - 107 mmol/L    CO2 TOTAL 29 21 - 31 mmol/L    ANION GAP 7 (L) 10 - 20 mmol/L    BUN 22 7 - 25 mg/dL    CREATININE 0.87 0.60 - 1.30 mg/dL    BUN/CREA RATIO 25 (H) 6 - 22    ESTIMATED GFR 67 >59 mL/min/1.56m2    ALBUMIN 3.8 3.5 - 5.7 g/dL    CALCIUM 9.1 8.6 - 10.3 mg/dL    GLUCOSE 263 (H) 74 - 109 mg/dL    ALKALINE PHOSPHATASE 56 34 - 104 U/L    ALT (SGPT) 7 7 - 52 U/L    AST (SGOT) 11 (L) 13 - 39 U/L    BILIRUBIN TOTAL 0.5 0.3 - 1.2 mg/dL    PROTEIN TOTAL 6.6 6.4 - 8.9 g/dL    ALBUMIN/GLOBULIN RATIO 1.4 0.8 - 1.4    OSMOLALITY, CALCULATED 285 270 - 290 mOsm/kg    CALCIUM, CORRECTED 9.3 8.9 - 10.8 mg/dL    GLOBULIN 2.8 (L) 2.9 - 5.4   C-REACTIVE PROTEIN (CRP)   Result Value Ref Range    C-REACTIVE PROTEIN (CRP) 0.5 0.1 - 0.5 mg/dL   LACTIC ACID LEVEL W/ REFLEX FOR LEVEL >2.0   Result Value Ref Range    LACTIC ACID 1.1 0.5 - 2.2  mmol/L   CBC WITH DIFF   Result Value Ref Range    WBCS UNCORRECTED 5.8 x10^3/uL    WBC 5.8 4.0 - 10.5 x10^3/uL    RBC 3.71 (L) 4.20 - 5.40 x10^6/uL    HGB 11.4 (L) 12.5 - 16.0 g/dL    HCT 33.5 (L) 37.0 - 47.0 %    MCV 90.5 78.0 - 99.0 fL    MCH 30.8 27.0 - 32.0 pg    MCHC 34.1 32.0 - 36.0 g/dL    RDW 13.9 11.6 - 14.8 %    PLATELETS 363 140 - 440 x10^3/uL    MPV 6.4 (L) 7.4 - 10.4 fL    NEUTROPHIL % 73 40 - 76 %    LYMPHOCYTE % 18 (L) 25 - 45 %    MONOCYTE % 5 0 - 12 %    EOSINOPHIL % 3 0 - 7 %    BASOPHIL % 1 0 - 3 %    NEUTROPHIL # 4.30 1.80 -  8.40 x10^3/uL    LYMPHOCYTE # 1.10 1.10 - 5.00 x10^3/uL    MONOCYTE # 0.30 0.00 - 1.30 x10^3/uL    EOSINOPHIL # 0.20 0.00 - 0.80 x10^3/uL    BASOPHIL # 0.10 0.00 - 0.30 x10^3/uL   URINALYSIS, MACROSCOPIC   Result Value Ref Range    COLOR Yellow Colorless, Light Yellow, Yellow    APPEARANCE Clear Clear    SPECIFIC GRAVITY 1.013 1.002 - 1.030    PH 7.5 5.0 - 9.0    LEUKOCYTES Negative Negative, 100  WBCs/uL    NITRITE Negative Negative    PROTEIN Negative Negative, 10 , 20  mg/dL    GLUCOSE 500 (A) Negative, 30  mg/dL    KETONES Negative Negative, Trace mg/dL    BILIRUBIN Negative Negative, 0.5 mg/dL    BLOOD Negative Negative mg/dL    UROBILINOGEN Normal Normal mg/dL   URINALYSIS, MICROSCOPIC   Result Value Ref Range    BUDDING YEAST Rare (A) (none) /hpf    RBCS 5 (H) <4 /hpf    WBCS 1 <6 /hpf    SQUAMOUS EPITHELIAL 1 <28 /hpf    CT of the abdomen/pelvis without contrast shows no acute findings.  Plan:  Will discharge home due to no leukocytosis, vital signs are stable, Jordan Buckley is afebrile, and CT shows no acute findings.  Patient does have urinary related signs and symptoms per the daughter at bedside.  Patient does complain of generalized abdominal pain.  Her bowel sounds are present in all 4 quadrants.  Jordan Buckley does have some diffuse tenderness noted.  Medications were ordered for discomfort.  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  Will discharge patient home due to no significant/urgent findings requiring admission, CT has no acute findings.  Jordan Buckley has no leukocytosis.  Patient does complain of urinary tract signs and symptoms, we will place her on Augmentin as well as Zofran.  Patient to continue her home pain medication management.  Jordan Buckley appears to have taken Augmentin in the past and tolerated without difficulty.  Jordan Buckley did respond well to ketorolac and Zofran in the emergency room and states her signs and symptoms have improved.  Patient to be discharged with daughter.  Daughter was instructed on CT results as well as aneurysm and need to monitor those outpatient through her primary care provider.  Jordan Buckley was instructed to return to the ER for worsening of pain, vomiting, fever.  Jordan Buckley did verbalize understanding.  I am  Course:   ED Course as of 12/03/21 0904   Tue Dec 03, 2021   0614 CT ABDOMEN PELVIS WO IV CONTRAST     Following the history, physical exam, and ED workup, the patient was deemed stable and suitable for discharge. The patient/caregiver was advised to return to the ED for any new or worsening symptoms. Discharge medications, and follow-up instructions were discussed with the patient/caregiver in detail, who verbalizes understanding. The patient/caregiver is in agreement and is comfortable with the plan of care.    Disposition: Discharged         Current Discharge Medication List      START taking these medications.      Details   amoxicillin-pot clavulanate 875-125 mg Tablet  Commonly known as: AUGMENTIN   1 Tablet, Oral, 2 TIMES DAILY  Qty: 20 Tablet  Refills: 0     ondansetron 4 mg Tablet,  Rapid Dissolve  Commonly known as: ZOFRAN ODT   4 mg, Oral, EVERY 8 HOURS PRN  Qty: 12 Tablet  Refills: 0        CONTINUE these medications - NO CHANGES were made during your visit.      Details   apixaban 2.5 mg Tablet  Commonly known as: ELIQUIS   5 mg, Oral, 2 TIMES  DAILY  Refills: 0     atorvastatin 20 mg Tablet  Commonly known as: LIPITOR   20 mg, Oral, EVERY EVENING  Refills: 0     Basaglar KwikPen U-100 Insulin 100 unit/mL Insulin Pen  Generic drug: insulin glargine   No dose, route, or frequency recorded.  Refills: 0     ergocalciferol (vitamin D2) 1,250 mcg (50,000 unit) Capsule  Commonly known as: DRISDOL   Vitamin D2 1,250 mcg (50,000 unit) capsule   Take by oral route.  Refills: 0     FeroSuL 325 mg (65 mg iron) Tablet  Generic drug: ferrous sulfate   325 mg, Oral, 2 TIMES DAILY WITH FOOD  Refills: 0     HumaLOG KwikPen Insulin 200 unit/mL (3 mL) Insulin Pen  Generic drug: insulin lispro   10 Units, DAILY  Refills: 0     levothyroxine 75 mcg Tablet  Commonly known as: SYNTHROID   75 mcg, Oral, EVERY MORNING  Refills: 0     magnesium oxide 400 mg Tablet  Commonly known as: MAG-OX   400 mg, Oral, 2 TIMES DAILY  Refills: 0     MetFORMIN 1,000 mg Tablet  Commonly known as: GLUCOPHAGE   1,000 mg, Oral, 2 TIMES DAILY WITH FOOD  Refills: 0     naproxen 500 mg Tablet  Commonly known as: NAPROSYN   500 mg, Oral, 2 TIMES DAILY WITH FOOD  Qty: 30 Tablet  Refills: 0     NovoLOG FlexPen U-100 Insulin 100 unit/mL (3 mL) Insulin Pen  Generic drug: insulin aspart U-100   No dose, route, or frequency recorded.  Refills: 0     pramipexole 0.125 mg Tablet  Commonly known as: MIRAPEX   0.125 mg, Oral, 3 TIMES DAILY  Refills: 0     traMADoL 50 mg Tablet  Commonly known as: ULTRAM   Oral, EVERY 6 HOURS PRN  Refills: 0          Follow up:   [facility]ina Shih [facility] [address]  [place] [address]  [phone]            Clinical Impression   Gastroenteritis (Primary)   Generalized abdominal discomfort         Benard Rink, APRN  12/03/2021, 06:57

## 2021-12-03 NOTE — Discharge Instructions (Signed)
FOLLOW UP WITH THE PRIMARY CARE PROVIDER AS SOON AS POSSIBLE BUT NO LATER THAN 3 DAYS NOW.  IF NO PRIMARY CARE PROVIDER EXISTS, THEN THE PATIENT IS INSTRUCTED TO ESTABLISH CARE WITH A PRIMARY CARE PROVIDER. FOLLOW-UP WITH ANY SPECIALIST PROVIDER AS INDICATED AS SOON AS POSSIBLE BUT NO LATER THAN 3 DAYS.  NOTIFY THE PRIMARY CARE PROVIDER THAT YOU WERE IN THE EMERGENCY DEPARTMENT WITHIN 24 HOURS OF DISCHARGE TO FOLLOW-UP ON YOUR RESULTS AND/OR TREATMENTS.  RETURN TO THE EMERGENCY DEPARTMENT IMMEDIATELY IF NEEDED, NO BETTER, WORSE, NEW SYMPTOMS ARISE, OR YOU CANNOT FOLLOW-UP WITH YOUR PRIMARY CARE PROVIDER IN THE PRESCRIBED TIMEFRAME.

## 2021-12-03 NOTE — ED Provider Notes (Signed)
[facility]  ED Primary Provider Note  History of Present Illness   Chief Complaint   Patient presents with   . Abdominal Pain   . Back Pain   . Urinary Pain     Jordan Buckley is a 81 y.o. female who had concerns including Abdominal Pain, Back Pain, and Urinary Pain.  Arrival: The patient arrived by Ambulance    Patient is an 81 year old female past medical history of CVA Alzheimer's type 2 diabetes mellitus presents to the ED with complaints of lower abdominal pain.  She is accompanied by daughter who contributes to the history.  States he has been having nausea and diarrhea since Saturday eating at the [employer].  She denies fevers or chills she does endorse dysuria.        Review of Systems   Pertinent positive and negative ROS as per HPI.  Historical Data   History Reviewed This Encounter: Medical History  Surgical History  Family History  Social History      Physical Exam   ED Triage Vitals [12/03/21 0337]   BP (Non-Invasive) (!) 182/75   Heart Rate 76   Respiratory Rate 18   Temperature 37.1 C (98.8 F)   SpO2 94 %   Weight 61.2 kg (135 lb)   Height 1.575 m (_0 )     Physical Exam  Constitutional:       Appearance: Normal appearance.   HENT:      Head: Normocephalic.      Mouth/Throat:      Mouth: Mucous membranes are moist.   Eyes:      Extraocular Movements: Extraocular movements intact.      Pupils: Pupils are equal, round, and reactive to light.   Cardiovascular:      Rate and Rhythm: Normal rate and regular rhythm.      Pulses: Normal pulses.      Heart sounds: Normal heart sounds.   Abdominal:      General: Bowel sounds are normal.      Comments: Mild right lower quadrant tenderness palpation no CVA tenderness normal bowel sounds no rebound or guarding   Neurological:      Mental Status: She is alert.       Patient Data     Labs Ordered/Reviewed   COMPREHENSIVE METABOLIC PANEL, NON-FASTING - Abnormal; Notable for the following components:        Result Value    ANION GAP 7 (*)     BUN/CREA RATIO 25 (*)     GLUCOSE 263 (*)     AST (SGOT) 11 (*)     GLOBULIN 2.8 (*)     All other components within normal limits    Narrative:     Estimated Glomerular Filtration Rate (eGFR) is calculated using the CKD-EPI (2021) equation, intended for patients 45 years of age and older. If gender is not documented or "unknown", there will be no eGFR calculation.   CBC WITH DIFF - Abnormal; Notable for the following components:    RBC 3.71 (*)     HGB 11.4 (*)     HCT 33.5 (*)     MPV 6.4 (*)     LYMPHOCYTE % 18 (*)     All other components within normal limits   URINALYSIS, MACROSCOPIC - Abnormal; Notable for the following components:    GLUCOSE 500 (*)     All other components within normal limits   URINALYSIS, MICROSCOPIC - Abnormal; Notable for the  following components:    BUDDING YEAST Rare (*)     RBCS 5 (*)     All other components within normal limits   C-REACTIVE PROTEIN (CRP) - Normal   LACTIC ACID LEVEL W/ REFLEX FOR LEVEL >2.0 - Normal   CBC/DIFF    Narrative:     The following orders were created for panel order CBC/DIFF.  Procedure                               Abnormality         Status                     ---------                               -----------         ------                     CBC WITH DIFF[[id number]]                Abnormal            Final result                 Please view results for these tests on the individual orders.   URINALYSIS, MACROSCOPIC AND MICROSCOPIC W/CULTURE REFLEX    Narrative:     The following orders were created for panel order URINALYSIS, MACROSCOPIC AND MICROSCOPIC W/CULTURE REFLEX [PRN ONLY].  Procedure                               Abnormality         Status                     ---------                               -----------         ------                     URINALYSIS, MACROSCOPIC[[id number]]      Abnormal            Final result               URINALYSIS, MICROSCOPIC[[id number]]      Abnormal            Final result                  Please view results for these tests on the individual orders.     CT ABDOMEN PELVIS WO IV CONTRAST   Final Result by Edi, [facility] In (04/25 0734)   1.NO ACUTE FINDINGS AT THE ABDOMEN OR PELVIS ON NONCONTRAST CT.          One or more dose reduction techniques were used (e.g., Automated exposure control, adjustment of the mA and/or kV according to patient size, use of iterative reconstruction technique).         Radiologist location ID: [email or web address]        Medical Decision Making  Patient 82 year old female presented the ED with complaints of suprapubic discomfort  and nausea and diarrhea.  Cbc metabolic panel unremarkable urinalysis contains glucose but no signs of urinary tract infection.  CT scan without contrast of abdomen pelvis pending.  Patient signed out    Amount and/or Complexity of Data Reviewed  Labs: ordered.  Radiology: ordered.               Medications Administered in the ED   ketorolac (TORADOL) 30 mg/mL injection (15 mg IntraMUSCULAR Given 12/03/21 0735)   ondansetron (ZOFRAN ODT) rapid dissolve tablet (4 mg Oral Given 12/03/21 0737)     Clinical Impression   Gastroenteritis (Primary)   Generalized abdominal discomfort       Disposition: Discharged

## 2021-12-03 NOTE — ED Triage Notes (Signed)
EMS called for abd pain radiating into the back. Upon EMS arrival pt c/o abd pain radiating into the back. Pain with urination. Recent back sx. Zofran '4mg'$  ODT. BS 232.

## 2021-12-03 NOTE — ED Nurses Note (Signed)
Pt into ER via EMS with c/o RLQ pain. Tender upon touch. Pt is alert w/ confusion. Daughter w/ pt states Alzheimer's. Hx of back surgery, kidney cancer and frequent UTI's. Pt able to stand w/ minimal assistance and use bedside toilet for urine specimen. C/O (R) rotator cuff tear w/ pain upon movement. Recent back surgery.

## 2021-12-03 NOTE — ED Nurses Note (Signed)
Patient discharged home with family. AVS reviewed with patient. A written copy of the AVS and discharge instructions were given to the patient. Questions sufficiently answered as needed. Patient encouraged to follow up with PCP as indicated. In the event of an emergency, patient instructed to call 911 or go to the nearest emergency room.

## 2021-12-08 LAB — ADULT ROUTINE BLOOD CULTURE, SET OF 2 BOTTLES (BACTERIA AND YEAST)
BLOOD CULTURE, ROUTINE: NO GROWTH
BLOOD CULTURE, ROUTINE: NO GROWTH

## 2021-12-16 ENCOUNTER — Encounter (HOSPITAL_COMMUNITY): Payer: Self-pay

## 2021-12-16 ENCOUNTER — Inpatient Hospital Stay
Admission: EM | Admit: 2021-12-16 | Discharge: 2021-12-20 | DRG: 543 | Disposition: A | Discharge status: Skilled Nursing Facility | Payer: Medicare Other | Attending: Internal Medicine | Admitting: Internal Medicine

## 2021-12-16 ENCOUNTER — Other Ambulatory Visit: Payer: Self-pay

## 2021-12-16 ENCOUNTER — Emergency Department (HOSPITAL_COMMUNITY): Payer: Medicare Other

## 2021-12-16 ENCOUNTER — Emergency Department (EMERGENCY_DEPARTMENT_HOSPITAL): Payer: Medicare Other

## 2021-12-16 DIAGNOSIS — Z7901 Long term (current) use of anticoagulants: Secondary | ICD-10-CM

## 2021-12-16 DIAGNOSIS — E871 Hypo-osmolality and hyponatremia: Secondary | ICD-10-CM | POA: Diagnosis not present

## 2021-12-16 DIAGNOSIS — Z951 Presence of aortocoronary bypass graft: Secondary | ICD-10-CM

## 2021-12-16 DIAGNOSIS — M545 Low back pain, unspecified: Secondary | ICD-10-CM

## 2021-12-16 DIAGNOSIS — M8008XA Age-related osteoporosis with current pathological fracture, vertebra(e), initial encounter for fracture: Secondary | ICD-10-CM

## 2021-12-16 DIAGNOSIS — M810 Age-related osteoporosis without current pathological fracture: Secondary | ICD-10-CM | POA: Diagnosis present

## 2021-12-16 DIAGNOSIS — Z7984 Long term (current) use of oral hypoglycemic drugs: Secondary | ICD-10-CM

## 2021-12-16 DIAGNOSIS — I10 Essential (primary) hypertension: Secondary | ICD-10-CM | POA: Diagnosis present

## 2021-12-16 DIAGNOSIS — Z794 Long term (current) use of insulin: Secondary | ICD-10-CM

## 2021-12-16 DIAGNOSIS — M4856XA Collapsed vertebra, not elsewhere classified, lumbar region, initial encounter for fracture: Principal | ICD-10-CM | POA: Diagnosis present

## 2021-12-16 DIAGNOSIS — I2510 Atherosclerotic heart disease of native coronary artery without angina pectoris: Secondary | ICD-10-CM | POA: Diagnosis present

## 2021-12-16 DIAGNOSIS — M549 Dorsalgia, unspecified: Secondary | ICD-10-CM | POA: Diagnosis present

## 2021-12-16 DIAGNOSIS — Z8673 Personal history of transient ischemic attack (TIA), and cerebral infarction without residual deficits: Secondary | ICD-10-CM

## 2021-12-16 DIAGNOSIS — R079 Chest pain, unspecified: Secondary | ICD-10-CM

## 2021-12-16 DIAGNOSIS — E11649 Type 2 diabetes mellitus with hypoglycemia without coma: Secondary | ICD-10-CM | POA: Diagnosis present

## 2021-12-16 DIAGNOSIS — Z79899 Other long term (current) drug therapy: Secondary | ICD-10-CM

## 2021-12-16 DIAGNOSIS — S32010A Wedge compression fracture of first lumbar vertebra, initial encounter for closed fracture: Secondary | ICD-10-CM | POA: Diagnosis present

## 2021-12-16 DIAGNOSIS — Z86718 Personal history of other venous thrombosis and embolism: Secondary | ICD-10-CM

## 2021-12-16 DIAGNOSIS — E079 Disorder of thyroid, unspecified: Secondary | ICD-10-CM | POA: Diagnosis present

## 2021-12-16 DIAGNOSIS — Z79890 Hormone replacement therapy (postmenopausal): Secondary | ICD-10-CM

## 2021-12-16 DIAGNOSIS — F0390 Unspecified dementia without behavioral disturbance: Secondary | ICD-10-CM

## 2021-12-16 DIAGNOSIS — E785 Hyperlipidemia, unspecified: Secondary | ICD-10-CM | POA: Diagnosis present

## 2021-12-16 DIAGNOSIS — G309 Alzheimer's disease, unspecified: Secondary | ICD-10-CM | POA: Diagnosis present

## 2021-12-16 DIAGNOSIS — E119 Type 2 diabetes mellitus without complications: Secondary | ICD-10-CM | POA: Diagnosis present

## 2021-12-16 DIAGNOSIS — R531 Weakness: Secondary | ICD-10-CM

## 2021-12-16 DIAGNOSIS — F0280 Dementia in other diseases classified elsewhere without behavioral disturbance: Secondary | ICD-10-CM | POA: Diagnosis present

## 2021-12-16 LAB — COMPREHENSIVE METABOLIC PANEL, NON-FASTING
ALBUMIN/GLOBULIN RATIO: 1.4 (ref 0.8–1.4)
ALBUMIN: 3.9 g/dL (ref 3.5–5.7)
ALKALINE PHOSPHATASE: 83 U/L (ref 34–104)
ALT (SGPT): <7 U/L — ABNORMAL LOW (ref 7–52)
ANION GAP: 5 mmol/L — ABNORMAL LOW (ref 10–20)
AST (SGOT): 11 U/L — ABNORMAL LOW (ref 13–39)
BILIRUBIN TOTAL: 0.7 mg/dL (ref 0.3–1.2)
BUN/CREA RATIO: 17 (ref 6–22)
BUN: 13 mg/dL (ref 7–25)
CALCIUM, CORRECTED: 9.1 mg/dL (ref 8.9–10.8)
CALCIUM: 9 mg/dL (ref 8.6–10.3)
CHLORIDE: 93 mmol/L — ABNORMAL LOW (ref 98–107)
CO2 TOTAL: 31 mmol/L (ref 21–31)
CREATININE: 0.76 mg/dL (ref 0.60–1.30)
ESTIMATED GFR: 79 mL/min/{1.73_m2} (ref >59)
GLOBULIN: 2.7 — ABNORMAL LOW (ref 2.9–5.4)
GLUCOSE: 377 mg/dL — ABNORMAL HIGH (ref 74–109)
OSMOLALITY, CALCULATED: 275 mOsm/kg (ref 270–290)
POTASSIUM: 4.7 mmol/L (ref 3.5–5.1)
PROTEIN TOTAL: 6.6 g/dL (ref 6.4–8.9)
SODIUM: 129 mmol/L — ABNORMAL LOW (ref 136–145)

## 2021-12-16 LAB — ECG 12 LEAD
Atrial Rate: 74 {beats}/min
Calculated P Axis: -20 degrees
Calculated R Axis: 76 degrees
Calculated T Axis: 92 degrees
PR Interval: 156 ms
QRS Duration: 82 ms
QT Interval: 368 ms
QTC Calculation: 408 ms
Ventricular rate: 74 {beats}/min

## 2021-12-16 LAB — CBC WITH DIFF
BASOPHIL #: 0 10*3/uL (ref 0.00–0.30)
BASOPHIL %: 1 % (ref 0–3)
EOSINOPHIL #: 0 10*3/uL (ref 0.00–0.80)
EOSINOPHIL %: 1 % (ref 0–7)
HCT: 38.8 % (ref 37.0–47.0)
HGB: 13.2 g/dL (ref 12.5–16.0)
LYMPHOCYTE #: 0.9 10*3/uL — ABNORMAL LOW (ref 1.10–5.00)
LYMPHOCYTE %: 16 % — ABNORMAL LOW (ref 25–45)
MCH: 30.4 pg (ref 27.0–32.0)
MCHC: 33.9 g/dL (ref 32.0–36.0)
MCV: 89.5 fL (ref 78.0–99.0)
MONOCYTE #: 0.4 10*3/uL (ref 0.00–1.30)
MONOCYTE %: 6 % (ref 0–12)
MPV: 6.6 fL — ABNORMAL LOW (ref 7.4–10.4)
NEUTROPHIL #: 4.7 10*3/uL (ref 1.80–8.40)
NEUTROPHIL %: 77 % — ABNORMAL HIGH (ref 40–76)
PLATELETS: 310 10*3/uL (ref 140–440)
RBC: 4.33 10*6/uL (ref 4.20–5.40)
RDW: 13.7 % (ref 11.6–14.8)
WBC: 6 10*3/uL (ref 4.0–10.5)
WBCS UNCORRECTED: 6 10*3/uL

## 2021-12-16 LAB — PT/INR
INR: 1.01 (ref <=5.00)
PROTHROMBIN TIME: 11.7 seconds (ref 9.8–12.7)

## 2021-12-16 LAB — TROPONIN-I
TROPONIN I: 8 ng/L (ref <15)
TROPONIN I: 7 ng/L (ref <15)

## 2021-12-16 LAB — POC BLOOD GLUCOSE (RESULTS)
GLUCOSE, POC: 499 mg/dl (ref 50–500)
GLUCOSE, POC: 513 mg/dl (ref 50–500)
GLUCOSE, POC: 519 mg/dl (ref 50–500)

## 2021-12-16 LAB — MAGNESIUM: MAGNESIUM: 1.5 mg/dL — ABNORMAL LOW (ref 1.9–2.7)

## 2021-12-16 LAB — B-TYPE NATRIURETIC PEPTIDE: BNP: 96 pg/mL (ref 5–100)

## 2021-12-16 LAB — PTT (PARTIAL THROMBOPLASTIN TIME): APTT: 34.1 seconds (ref 26.0–36.0)

## 2021-12-16 MED ORDER — MAGNESIUM OXIDE 400 MG (241.3 MG MAGNESIUM) TABLET
400.0000 mg | ORAL_TABLET | Freq: Two times a day (BID) | ORAL | Status: DC
Start: 2021-12-17 — End: 2021-12-20
  Administered 2021-12-17 – 2021-12-20 (×8): 400 mg via ORAL
  Filled 2021-12-16 (×7): qty 1

## 2021-12-16 MED ORDER — INSULIN U-100 REGULAR HUMAN 100 UNIT/ML INJECTION SOLUTION
INTRAMUSCULAR | Status: AC
Start: 2021-12-16 — End: 2021-12-16
  Filled 2021-12-16: qty 30

## 2021-12-16 MED ORDER — INSULIN REGULAR HUMAN 100 UNIT/ML INJECTION SSIP
0.0000 [IU] | INJECTION | Freq: Four times a day (QID) | SUBCUTANEOUS | Status: DC | PRN
Start: 2021-12-16 — End: 2021-12-20
  Administered 2021-12-17 (×2): 12 [IU] via SUBCUTANEOUS
  Administered 2021-12-18: 6 [IU] via SUBCUTANEOUS
  Administered 2021-12-18: 12 [IU] via SUBCUTANEOUS
  Administered 2021-12-19: 6 [IU] via SUBCUTANEOUS
  Administered 2021-12-19 (×2): 3 [IU] via SUBCUTANEOUS
  Administered 2021-12-19: 12 [IU] via SUBCUTANEOUS
  Administered 2021-12-19: 3 [IU] via SUBCUTANEOUS
  Administered 2021-12-20: 12 [IU] via SUBCUTANEOUS
  Administered 2021-12-20: 8 [IU] via SUBCUTANEOUS
  Filled 2021-12-16: qty 36
  Filled 2021-12-16: qty 24
  Filled 2021-12-16: qty 9
  Filled 2021-12-16 (×3): qty 36
  Filled 2021-12-16: qty 18
  Filled 2021-12-16 (×2): qty 9
  Filled 2021-12-16: qty 18
  Filled 2021-12-16: qty 36

## 2021-12-16 MED ORDER — DEXTROSE 50 % IN WATER (D50W) INTRAVENOUS SYRINGE
12.5000 g | INJECTION | INTRAVENOUS | Status: DC | PRN
Start: 2021-12-16 — End: 2021-12-20

## 2021-12-16 MED ORDER — TRAMADOL 50 MG TABLET
100.0000 mg | ORAL_TABLET | ORAL | Status: AC
Start: 2021-12-16 — End: 2021-12-16
  Administered 2021-12-16: 100 mg via ORAL

## 2021-12-16 MED ORDER — MAGNESIUM SULFATE 1 GRAM/100 ML IN DEXTROSE 5 % INTRAVENOUS PIGGYBACK
INJECTION | INTRAVENOUS | Status: AC
Start: 2021-12-16 — End: 2021-12-16
  Filled 2021-12-16: qty 100

## 2021-12-16 MED ORDER — DIPHENHYDRAMINE 25 MG CAPSULE
25.0000 mg | ORAL_CAPSULE | Freq: Every evening | ORAL | Status: DC | PRN
Start: 2021-12-16 — End: 2021-12-20

## 2021-12-16 MED ORDER — LEVOTHYROXINE 50 MCG TABLET
75.0000 ug | ORAL_TABLET | Freq: Every morning | ORAL | Status: DC
Start: 2021-12-17 — End: 2021-12-20
  Administered 2021-12-17 – 2021-12-20 (×4): 75 ug via ORAL
  Filled 2021-12-16 (×5): qty 2

## 2021-12-16 MED ORDER — GLUCAGON 1 MG/ML SOLUTION FOR INJECTION
1.0000 mg | INTRAMUSCULAR | Status: DC | PRN
Start: 2021-12-16 — End: 2021-12-20

## 2021-12-16 MED ORDER — FERROUS SULFATE 324 MG (65 MG IRON) TABLET,DELAYED RELEASE
324.0000 mg | DELAYED_RELEASE_TABLET | Freq: Every morning | ORAL | Status: DC
Start: 2021-12-17 — End: 2021-12-20
  Administered 2021-12-17 – 2021-12-20 (×4): 324 mg via ORAL
  Filled 2021-12-16 (×5): qty 1

## 2021-12-16 MED ORDER — ACETAMINOPHEN 325 MG TABLET
650.0000 mg | ORAL_TABLET | ORAL | Status: DC | PRN
Start: 2021-12-16 — End: 2021-12-20

## 2021-12-16 MED ORDER — HYDROCODONE 5 MG-ACETAMINOPHEN 325 MG TABLET
1.0000 | ORAL_TABLET | Freq: Four times a day (QID) | ORAL | Status: DC | PRN
Start: 2021-12-16 — End: 2021-12-18
  Administered 2021-12-17 (×3): 1 via ORAL
  Filled 2021-12-16 (×3): qty 1

## 2021-12-16 MED ORDER — MAGNESIUM SULFATE 1 GRAM/100 ML IN DEXTROSE 5 % INTRAVENOUS PIGGYBACK
1.0000 g | INJECTION | INTRAVENOUS | Status: AC
Start: 2021-12-16 — End: 2021-12-16
  Administered 2021-12-16: 1 g via INTRAVENOUS
  Administered 2021-12-16: 0 g via INTRAVENOUS

## 2021-12-16 MED ORDER — SODIUM CHLORIDE 0.9 % IV BOLUS
1000.0000 mL | INJECTION | Status: AC
Start: 2021-12-16 — End: 2021-12-16
  Administered 2021-12-16: 1000 mL via INTRAVENOUS
  Administered 2021-12-16: 0 mL via INTRAVENOUS

## 2021-12-16 MED ORDER — KETOROLAC 30 MG/ML (1 ML) INJECTION SOLUTION
INTRAMUSCULAR | Status: AC
Start: 2021-12-16 — End: 2021-12-16
  Filled 2021-12-16: qty 1

## 2021-12-16 MED ORDER — TRAMADOL 50 MG TABLET
ORAL_TABLET | ORAL | Status: AC
Start: 2021-12-16 — End: 2021-12-16
  Filled 2021-12-16: qty 2

## 2021-12-16 MED ORDER — IPRATROPIUM 0.5 MG-ALBUTEROL 3 MG (2.5 MG BASE)/3 ML NEBULIZATION SOLN
3.0000 mL | INHALATION_SOLUTION | RESPIRATORY_TRACT | Status: DC | PRN
Start: 2021-12-16 — End: 2021-12-20

## 2021-12-16 MED ORDER — INSULIN REGULAR HUMAN 100 UNIT/ML INJECTION - CHARGE BY DOSE
10.0000 [IU] | Freq: Once | INTRAMUSCULAR | Status: AC
Start: 2021-12-16 — End: 2021-12-16
  Administered 2021-12-16: 10 [IU] via SUBCUTANEOUS

## 2021-12-16 MED ORDER — PRAMIPEXOLE 0.25 MG TABLET
0.1250 mg | ORAL_TABLET | Freq: Three times a day (TID) | ORAL | Status: DC
Start: 2021-12-17 — End: 2021-12-20
  Administered 2021-12-17 – 2021-12-19 (×10): 0.125 mg via ORAL
  Administered 2021-12-20: 0 mg via ORAL
  Administered 2021-12-20: 0.125 mg via ORAL
  Filled 2021-12-16 (×2): qty 1
  Filled 2021-12-16 (×2): qty 0.5
  Filled 2021-12-16 (×2): qty 1
  Filled 2021-12-16: qty 0.5
  Filled 2021-12-16: qty 1
  Filled 2021-12-16 (×2): qty 0.5
  Filled 2021-12-16: qty 1
  Filled 2021-12-16: qty 0.5

## 2021-12-16 MED ORDER — KETOROLAC 30 MG/ML (1 ML) INJECTION SOLUTION
15.0000 mg | INTRAMUSCULAR | Status: AC
Start: 2021-12-16 — End: 2021-12-16
  Administered 2021-12-16: 15 mg via INTRAVENOUS

## 2021-12-16 MED ORDER — APIXABAN 5 MG TABLET
5.0000 mg | ORAL_TABLET | Freq: Two times a day (BID) | ORAL | Status: DC
Start: 2021-12-17 — End: 2021-12-20
  Administered 2021-12-17 – 2021-12-20 (×8): 5 mg via ORAL
  Filled 2021-12-16 (×7): qty 1

## 2021-12-16 MED ORDER — ATORVASTATIN 10 MG TABLET
20.0000 mg | ORAL_TABLET | Freq: Every evening | ORAL | Status: DC
Start: 2021-12-17 — End: 2021-12-20
  Administered 2021-12-17 – 2021-12-19 (×4): 20 mg via ORAL
  Filled 2021-12-16 (×3): qty 2

## 2021-12-16 MED ORDER — DEXTROSE 50 % IN WATER (D50W) INTRAVENOUS SYRINGE
25.0000 g | INJECTION | INTRAVENOUS | Status: DC | PRN
Start: 2021-12-16 — End: 2021-12-20
  Filled 2021-12-16: qty 50

## 2021-12-16 MED ORDER — INSULIN REGULAR HUMAN 100 UNIT/ML INJECTION - CHARGE BY DOSE
20.0000 [IU] | INTRAMUSCULAR | Status: AC
Start: 2021-12-17 — End: 2021-12-16
  Administered 2021-12-16: 20 [IU] via SUBCUTANEOUS
  Administered 2021-12-18: 0 [IU] via SUBCUTANEOUS

## 2021-12-16 MED ORDER — ONDANSETRON 4 MG DISINTEGRATING TABLET
4.0000 mg | ORAL_TABLET | Freq: Three times a day (TID) | ORAL | Status: DC | PRN
Start: 2021-12-16 — End: 2021-12-20
  Administered 2021-12-17: 4 mg via ORAL
  Filled 2021-12-16: qty 1

## 2021-12-16 MED ORDER — INSULIN U-100 REGULAR HUMAN 100 UNIT/ML INJECTION SOLUTION
INTRAMUSCULAR | Status: AC
Start: 2021-12-16 — End: 2021-12-16
  Filled 2021-12-16: qty 60

## 2021-12-16 NOTE — ED Triage Notes (Signed)
Complaining of pain to anterior chest that radiates to left arm and left leg.  Chest pain began this am.  Daughter reports increased pain all over this weekend.  20 gauge left wrist, BS 455, 2 lpm nc, ns kvo

## 2021-12-16 NOTE — ED Provider Notes (Signed)
[facility]  ED Primary Provider Note  History of Present Illness   Chief Complaint   Patient presents with   . Chest Pain      Jordan Buckley is a 81 y.o. female who had concerns including Chest Pain .  Arrival: The patient arrived by Ambulance    Patient 81 year old female presents the emergency room with chest pain, weakness, and low back pain that radiates into bilateral hips causing difficulty with ambulation for 3 days.  Patient accompanied by daughter who states patient had kyphoplasty 2 weeks prior to arrival for her low back pain with immediate relief after compression fracture.  Patient since has developed severe localized low back pain to lumbar spine that radiates to bilateral hips with decreased ambulation.  Patient denies radicular symptoms.  Daughter states she is worried that patient has injured her lower back as well.  Patient also complaining of substernal chest pain that she describes as pressure x3 days.  Patient denies radiation or medication prior to arrival.  Patient has a history coronary artery disease, diabetes type 2, hyperlipidemia, osteoporosis and Alzheimer's disease.        Review of Systems   Pertinent positive and negative ROS as per HPI.  Historical Data   History Reviewed This Encounter: Medical History  Surgical History  Family History  Social History      Physical Exam   ED Triage Vitals [12/16/21 1326]   BP (Non-Invasive) (!) 167/90   Heart Rate 86   Respiratory Rate 18   Temperature 36.3 C (97.4 F)   SpO2 97 %   Weight 63.5 kg (140 lb)   Height 1.575 m ('5\' 2"' )     Physical Exam  Vitals and nursing note reviewed.   Constitutional:       General: She is not in acute distress.     Appearance: She is well-developed.   HENT:      Head: Normocephalic and atraumatic.   Eyes:      Conjunctiva/sclera: Conjunctivae normal.   Cardiovascular:      Rate and Rhythm: Normal rate and regular rhythm.      Heart sounds: Normal heart sounds. No murmur  heard.  Pulmonary:      Effort: Pulmonary effort is normal. No respiratory distress.      Breath sounds: Normal breath sounds.   Abdominal:      Palpations: Abdomen is soft.      Tenderness: There is no abdominal tenderness.   Musculoskeletal:         General: No swelling.      Cervical back: Neck supple.      Thoracic back: Tenderness present.      Lumbar back: Tenderness present.        Back:       Right lower leg: No edema.      Left lower leg: No edema.   Skin:     General: Skin is warm and dry.      Capillary Refill: Capillary refill takes less than 2 seconds.   Neurological:      Mental Status: She is alert.   Psychiatric:         Mood and Affect: Mood normal.       Patient Data     Labs Ordered/Reviewed   COMPREHENSIVE METABOLIC PANEL, NON-FASTING - Abnormal; Notable for the following components:       Result Value    SODIUM 129 (*)     CHLORIDE 93 (*)  ANION GAP 5 (*)     GLUCOSE 377 (*)     ALT (SGPT) <7 (*)     AST (SGOT) 11 (*)     GLOBULIN 2.7 (*)     All other components within normal limits    Narrative:     Estimated Glomerular Filtration Rate (eGFR) is calculated using the CKD-EPI (2021) equation, intended for patients 45 years of age and older. If gender is not documented or "unknown", there will be no eGFR calculation.   MAGNESIUM - Abnormal; Notable for the following components:    MAGNESIUM 1.5 (*)     All other components within normal limits   CBC WITH DIFF - Abnormal; Notable for the following components:    MPV 6.6 (*)     NEUTROPHIL % 77 (*)     LYMPHOCYTE % 16 (*)     LYMPHOCYTE # 0.90 (*)     All other components within normal limits   TROPONIN-I - Normal   TROPONIN-I - Normal   B-TYPE NATRIURETIC PEPTIDE - Normal    Narrative:                                 Class 1: 101-250 pg/mL                              Class 2: 251-550 pg/mL                              Class 3: 551-900 pg/mL                              Class 4: >901 pg/mL     The [employer] has developed a  four-stage functional classification system for CHF that is based on a subjective interpretation of the severity of a patient's clinical signs and symptoms.    Class 1 - Patients have no limitations on physical activity and have no symptoms with ordinary physical activity.    Class 2 - Patients have a slight limitation of physical activity and have symptoms with ordinary physical activity.    Class 3 - Patients have a marked limitation of physical activity and have symptoms with less than ordinary physical activity, but not at rest.    Class 4 - Patients are unable to perform any physical activity without discomfort.   PT/INR - Normal    Narrative:     INR OF 2.0-3.0  RECOMMENDED FOR: PROPHYLAXIS/TREATMENT OF VENEOUS THROMBOSIS, PULMONARY EMBOLISM, PREVENTION OF SYSTEMIC EMBOLISM FROM ATRIAL FIBRILATION, MYOCARDIAL INFARCTION.    INR OF 2.5-3.5  RECOMMENDED FOR MECHANICAL PROSTHETIC HEART VALVES, RECURRENT SYSTEMIC EMBOLISM, RECURRENT MYOCARDIAL INFARCTION.     PTT (PARTIAL THROMBOPLASTIN TIME) - Normal   POC BLOOD GLUCOSE (RESULTS) - Normal   CBC/DIFF    Narrative:     The following orders were created for panel order CBC/DIFF.  Procedure                               Abnormality         Status                     ---------                               -----------         ------  CBC WITH DIFF[[id number]]                Abnormal            Final result                 Please view results for these tests on the individual orders.   URINALYSIS, MACROSCOPIC AND MICROSCOPIC W/CULTURE REFLEX    Narrative:     The following orders were created for panel order URINALYSIS, MACROSCOPIC AND MICROSCOPIC W/CULTURE REFLEX [PRN ONLY].  Procedure                               Abnormality         Status                     ---------                               -----------         ------                     URINALYSIS, MACROSCOPIC[[id number]]                                                     URINALYSIS,  MICROSCOPIC[[id number]]                                                       Please view results for these tests on the individual orders.   URINALYSIS, MACROSCOPIC   URINALYSIS, MICROSCOPIC     CT LUMBAR SPINE WO IV CONTRAST   Final Result by Edi, Radresults In (05/08 1648)   There are T12 and L4 compression fractures as described on the MRI scan from 11/19/2021.   Marland Kitchen No other new fracture is identified. Evaluation is limited by artifact from hardware. Please note that the patient has transitional anatomy with sacralization of L5. The numbering on this exam is the same as on the MRI scan from 11/19/2021.      One or more dose reduction techniques were used (e.g., Automated exposure control, adjustment of the mA and/or kV according to patient size, use of iterative reconstruction technique).         Radiologist location ID: [id number]         CT THORACIC SPINE WO IV CONTRAST   Final Result by Edi, Radresults In (05/08 1636)   No acute fracture or traumatic malalignment      Nonacute T12 compression deformity status post, kyphoplasty      Age-indeterminate T9-10 central disc protrusion with at least mild central canal narrowing.         One or more dose reduction techniques were used (e.g., Automated exposure control, adjustment of the mA and/or kV according to patient size, use of iterative reconstruction technique).         Radiologist location ID: [id number]         XR CHEST PA AND LATERAL   Final Result by Edi, Radresults In (05/08 1423)   Stable  postsurgical borderline cardiomegaly with suggestion of mild central vascular congestion when compared to 04/25/2020.         Radiologist location ID: [place] Decision Making        Medical Decision Making  Patient 81 year old female presents the emergency room with chest pain, weakness, and low back pain that radiates into bilateral hips causing difficulty with ambulation for 3 days.  Patient accompanied by daughter who states patient had  kyphoplasty 2 weeks prior to arrival for her low back pain with immediate relief after compression fracture.  Patient since has developed severe localized low back pain to lumbar spine that radiates to bilateral hips with decreased ambulation.  Patient denies radicular symptoms.  Daughter states she is worried that patient has injured her lower back as well.  Patient also complaining of substernal chest pain that she describes as pressure x3 days.  Patient denies radiation or medication prior to arrival.  Patient has a history coronary artery disease, diabetes type 2, hyperlipidemia, osteoporosis and Alzheimer's disease.  See physical examination.  Upon review of CT scan, labs, EKG and chest x-ray no acute abnormalities noted.  Upon re-evaluation with patient patient still in pain after Toradol and tramadol shot.  Discussed with patient and family that patient feels weak and is unable to control pain at home.  Hospitalist paged for admission for observation due to weakness and pain control.  Patient    Amount and/or Complexity of Data Reviewed  Labs: ordered.  Radiology: ordered.  ECG/medicine tests: ordered.      Risk  Prescription drug management.  Decision regarding hospitalization.        ED Course as of 12/16/21 2157   Mon Dec 16, 2021   1952 BP (Non-Invasive)(!): 137/103         Medications Administered in the ED   ketorolac (TORADOL) 30 mg/mL injection (15 mg Intravenous Given 12/16/21 1438)   NS bolus infusion 1,000 mL (0 mL Intravenous Stopped 12/16/21 1715)   magnesium sulfate 1 G in D5W 100 mL premix IVPB (0 g Intravenous Stopped 12/16/21 1645)   traMADol (ULTRAM) tablet (has no administration in time range)     Clinical Impression   Weakness (Primary)   Low back pain   Alzheimer disease (CMS HCC)       Disposition: Admitted

## 2021-12-16 NOTE — H&P (Addendum)
[facility]  History and Physical    Date of Service:  12/16/21   Jordan, Buckley, 81 y.o. female  Encounter Start Date:  12/16/2021  Inpatient Admission Date: 12/16/2021  Date of Birth:  Aug 03, 1941  PCP: Jordan Paterson, PA-C    Chief Complaint:  Chest pain/back pain    HPI: Jordan Buckley is a 81 y.o., White female who presents chest pain and mid back pain.  Patient also complains of increasing weakness and some low back pain that radiates into bilateral hips calling difficulty with ambulation for the last 3 days.  Patient did have kyphoplasty last month her lumbar area.  Patient states that she had immediate relief after the kyphoplasty 2 weeks ago.  Since then she is developed some localized low back pain to the lumbar spine and radiates into the hips with some decreased ambulation.  Denies any radicular symptoms.  Patient had negative troches in the ER.  Denies any radiation of the chest pain.  Patient does have history of coronary artery disease, type 2 diabetes, hyperlipidemia and according to the patient's severe osteoporosis and Alzheimer disease.  Patient is agreeable to be admitted to be treated.  Will get physical therapy to work with patient.  P.r.n. pain medications been ordered.    Past Medical History:    Past Medical History:   Diagnosis Date   . Alzheimer's dementia (CMS [place])    . Arthropathy    . Cancer (CMS [place])    . Coronary artery disease    . CVA (cerebrovascular accident) (CMS [place])    . Diabetes mellitus, type 2 (CMS [facility])    . Hard of hearing    . HTN (hypertension)    . Osteoporosis    . Thyroid disease    . Wears glasses              Medications Prior to Admission     Prescriptions    apixaban (ELIQUIS) 2.5 mg Oral Tablet    Take 2 Tablets (5 mg total) by mouth Twice daily    atorvastatin (LIPITOR) 20 mg Oral Tablet    Take 1 Tablet (20 mg total) by mouth Every evening    BASAGLAR KWIKPEN U-100 INSULIN 100 unit/mL (3 mL) Subcutaneous injection (KwikPen)    ergocalciferol, vitamin  D2, (DRISDOL) 1,250 mcg (50,000 unit) Oral Capsule    Vitamin D2 1,250 mcg (50,000 unit) capsule   Take by oral route.    ferrous sulfate (FEROSUL) 325 mg (65 mg iron) Oral Tablet    Take 1 Tablet (325 mg total) by mouth Twice daily with food    insulin lispro (HUMALOG KWIKPEN INSULIN) 200 unit/mL (3 mL) Subcutaneous Insulin Pen    10 Units Once a day    levothyroxine (SYNTHROID) 75 mcg Oral Tablet    Take 1 Tablet (75 mcg total) by mouth Every morning    magnesium oxide (MAG-OX) 400 mg Oral Tablet    Take 1 Tablet (400 mg total) by mouth Twice daily    MetFORMIN (GLUCOPHAGE) 1,000 mg Oral Tablet    Take 1 Tablet (1,000 mg total) by mouth Twice daily with food    NOVOLOG FLEXPEN U-100 INSULIN 100 unit/mL (3 mL) Subcutaneous Insulin Pen    ondansetron (ZOFRAN ODT) 4 mg Oral Tablet, Rapid Dissolve    Take 1 Tablet (4 mg total) by mouth Every 8 hours as needed for Nausea/Vomiting    pramipexole (MIRAPEX) 0.125 mg Oral Tablet    Take 1 Tablet (0.125 mg total) by  mouth Three times a day    traMADoL (ULTRAM) 50 mg Oral Tablet    Take by mouth Every 6 hours as needed for Pain        Allergies   Allergen Reactions   . Cefaclor Rash   . Nitrofurantoin Rash   . Quinine Rash   . Sulfa (Sulfonamides) Rash   . Ciprofloxacin    . Dulaglutide    . Fluconazole    . Nitrofurantoin Macrocrystal    . Quinidine-Quinine Analogues (Cinchona Alkaloids)        Past Surgical History:  Past Surgical History:   Procedure Laterality Date   . FIXATION KYPHOPLASTY Right 11/27/2021   . HX BACK SURGERY     . HX CATARACT REMOVAL     . HX CESAREAN SECTION     . HX CHOLECYSTECTOMY     . HX CORONARY ARTERY BYPASS GRAFT     . HX HYSTERECTOMY     . KIDNEY SURGERY Left     cancerous tumor removed   . LIVER RESECTION     . WRIST SURGERY Left            Family History:  Family Medical History:    None            Social History:  Social History     Tobacco Use   . Smoking status: Never   . Smokeless tobacco: Never   Vaping Use   . Vaping Use: Never used    Substance Use Topics   . Alcohol use: Never   . Drug use: Never        Review of Systems:  General: No fever or chills. No weight changes. Positive fatigue, weakness.   HEENT: No headaches, dizziness, changes in vision, changes in hearing, or difficulty swallowing.    Skin:  No rashes, erythema or bruises.   Cardiac: No chest pain, palpitations, or arrhythmia.    Respiratory: No shortness of breath, cough, or wheezing.  GI: No nausea or vomiting. No abdominal pain.   Urinary: No dysuria, hematuria, or change in frequency.    Vascular: No edema.     Musculoskeletal:  Also weakness, difficulty ambulating  Neurologic: No loss of sensation, numbness or tingling.   Endocrine: No heat or cold intolerance or polydipsia.   Psychiatric: No insomnia, depression or anxiety.  Examination:  BP (!) 143/57   Pulse 83   Temp 36.3 C (97.4 F)   Resp 17   Ht 1.575 m (_0 )   Wt 63.5 kg (140 lb)   SpO2 100%   BMI 25.61 kg/m       Filed Vitals:    12/16/21 1830 12/16/21 1900 12/16/21 2000 12/16/21 2100   BP: (!) 151/87 (!) 151/95 (!) 156/61 (!) 143/57   Pulse: 79 73 77 83   Resp: _1 Temp:       SpO2: 100% 100% 100% 100%      General: Patient is alert and oriented to person, place, and time. No acute distress. Communicates appropriately.   Head: Normocephalic and atraumatic.    Eyes: Pupils equally round and react to light and accommodate. Extraocular movements intact.  Conjunctiva normal. Sclerae are normal.    Nose: Nasal passages clear. Mucosa moist.    Throat: Moist oral mucosa. No erythema or exudate of the pharynx. Clear oropharynx.    Neck: Supple. No cervical lymphadenopathy or supraclavicular nodes detected. Trachea midline   Heart: Regular rate and rhythm. S1 &  S2 present. No S3 or S4. No rubs, gallops, or murmurs appreciated.  Radial and dorsalis pedis pulses +2/4 bilaterally.  Brisk capillary refill.    Lungs: Clear to auscultation bilaterally with no wheezes or rales. Equal chest excursion.  No  conversational dyspnea. No respiratory distress noted.   Abdomen: Soft, nontender, nondistended belly. Bowel sounds are present in all four quadrants. No rigidity.  No guarding.  No ascites.   Extremities: No edema, cyanosis, or clubbing. Grossly moves all extremities.    Skin: Warm and dry without lesions. No ecchymosis noted.    Neurologic: Cranial nerves II through XII are grossly intact. Sensation to light touch is intact. Strength 5/5 in upper extremities and lower extremities bilaterally.    Genitourinary:  No urinary incontinence or Foley catheter   Psychiatric: Judgment and insight are intact. Mood and affect are appropriate for the situation.       Labs:    Results for orders placed or performed during the hospital encounter of 12/16/21 (from the past 24 hour(s))   ECG 12 LEAD   Result Value Ref Range    Ventricular rate 74 BPM    Atrial Rate 74 BPM    PR Interval 156 ms    QRS Duration 82 ms    QT Interval 368 ms    QTC Calculation 408 ms    Calculated P Axis -20 degrees    Calculated R Axis 76 degrees    Calculated T Axis 92 degrees   COMPREHENSIVE METABOLIC PANEL, NON-FASTING   Result Value Ref Range    SODIUM 129 (L) 136 - 145 mmol/L    POTASSIUM 4.7 3.5 - 5.1 mmol/L    CHLORIDE 93 (L) 98 - 107 mmol/L    CO2 TOTAL 31 21 - 31 mmol/L    ANION GAP 5 (L) 10 - 20 mmol/L    BUN 13 7 - 25 mg/dL    CREATININE 0.76 0.60 - 1.30 mg/dL    BUN/CREA RATIO 17 6 - 22    ESTIMATED GFR 79 >59 mL/min/1.24m2    ALBUMIN 3.9 3.5 - 5.7 g/dL    CALCIUM 9.0 8.6 - 10.3 mg/dL    GLUCOSE 377 (H) 74 - 109 mg/dL    ALKALINE PHOSPHATASE 83 34 - 104 U/L    ALT (SGPT) <7 (L) 7 - 52 U/L    AST (SGOT) 11 (L) 13 - 39 U/L    BILIRUBIN TOTAL 0.7 0.3 - 1.2 mg/dL    PROTEIN TOTAL 6.6 6.4 - 8.9 g/dL    ALBUMIN/GLOBULIN RATIO 1.4 0.8 - 1.4    OSMOLALITY, CALCULATED 275 270 - 290 mOsm/kg    CALCIUM, CORRECTED 9.1 8.9 - 10.8 mg/dL    GLOBULIN 2.7 (L) 2.9 - 5.4   TROPONIN-I NOW   Result Value Ref Range    TROPONIN I 8 <15 ng/L   B-TYPE  NATRIURETIC PEPTIDE   Result Value Ref Range    BNP 96 5 - 100 pg/mL   MAGNESIUM   Result Value Ref Range    MAGNESIUM 1.5 (L) 1.9 - 2.7 mg/dL   PT/INR   Result Value Ref Range    PROTHROMBIN TIME 11.7 9.8 - 12.7 seconds    INR 1.01 <=5.00   PTT (PARTIAL THROMBOPLASTIN TIME)   Result Value Ref Range    APTT 34.1 26.0 - 36.0 seconds   CBC WITH DIFF   Result Value Ref Range    WBCS UNCORRECTED 6.0 x10^3/uL    WBC 6.0 4.0 - 10.5 x10^3/uL  RBC 4.33 4.20 - 5.40 x10^6/uL    HGB 13.2 12.5 - 16.0 g/dL    HCT 38.8 37.0 - 47.0 %    MCV 89.5 78.0 - 99.0 fL    MCH 30.4 27.0 - 32.0 pg    MCHC 33.9 32.0 - 36.0 g/dL    RDW 13.7 11.6 - 14.8 %    PLATELETS 310 140 - 440 x10^3/uL    MPV 6.6 (L) 7.4 - 10.4 fL    NEUTROPHIL % 77 (H) 40 - 76 %    LYMPHOCYTE % 16 (L) 25 - 45 %    MONOCYTE % 6 0 - 12 %    EOSINOPHIL % 1 0 - 7 %    BASOPHIL % 1 0 - 3 %    NEUTROPHIL # 4.70 1.80 - 8.40 x10^3/uL    LYMPHOCYTE # 0.90 (L) 1.10 - 5.00 x10^3/uL    MONOCYTE # 0.40 0.00 - 1.30 x10^3/uL    EOSINOPHIL # 0.00 0.00 - 0.80 x10^3/uL    BASOPHIL # 0.00 0.00 - 0.30 x10^3/uL   TROPONIN-I IN ONE HOUR   Result Value Ref Range    TROPONIN I 7 <15 ng/L   POC BLOOD GLUCOSE (RESULTS)   Result Value Ref Range    GLUCOSE, POC 499 50 - 500 mg/dl   POC BLOOD GLUCOSE (RESULTS)   Result Value Ref Range    GLUCOSE, POC 513 (HH) 50 - 500 mg/dl   POC BLOOD GLUCOSE (RESULTS)   Result Value Ref Range    GLUCOSE, POC 519 (HH) 50 - 500 mg/dl       Imaging Studies:   ECG 12 LEAD  Normal sinus rhythm with sinus arrhythmia  Low voltage QRS  Septal infarct , age undetermined  Abnormal ECG  No previous ECGs available  Confirmed by Anderson Malta ([id number]) on 12/16/2021 6:24:51 PM  CT LUMBAR SPINE WO IV CONTRAST  Narrative: Adalynne Gavia    RADIOLOGIST: Berton Bon, MD    CT LUMBAR SPINE WO IV CONTRAST performed on 12/16/2021 4:17 PM    CLINICAL HISTORY: post surgical pain.  back surg april now severe pains back hips ? new fx    TECHNIQUE:  Lumbar spine CT without contrast    COMPARISON: MRI  scan 11/19/2021  # of known CTs in the past 12 months: 10   # of known Cardiac Nuclear Medicine Studies in the past 12 months: 0    FINDINGS:  Vertebrae:  There is less than 50% compression of the super endplate of L4 similar to the prior scan. There is up to 50% compression of the T12 vertebra with cement present.. There is artifact related to posterior hardware at the L3-L5 levels.     Alignment:  There is grade 1 L4-5 anterolisthesis    T11-T12:: There is broad-based left paracentral disc herniation resulting in some moderate to severe left recess stenosis  T12-L1:: There is broad-based central disc herniation resulting in moderate to severe bilateral recess stenosis and mild-to-moderate central stenosis  L1-L2:: Broad-based central disc herniation with moderate to severe bilateral recess stenosis and mild-to-moderate central stenosis  L2-L3:: Evaluation of this level is limited by significant artifact from hardware. No obvious stenosis  L3-L4:: Evaluation of this level is limited by artifact from hardware. No obvious stenosis  L4-L5:: There is some bilateral recess stenosis related to annular bulge and spurring. This is moderate to severe on the right and mild-to-moderate on the left  L5-S1: Negative for stenosis.    Sacrum:  Visualized  upper sacrum and SI joints are unremarkable.    There are posttreatment changes to the left kidney. There are vascular calcifications.  Impression: There are T12 and L4 compression fractures as described on the MRI scan from 11/19/2021.  Marland Kitchen No other new fracture is identified. Evaluation is limited by artifact from hardware. Please note that the patient has transitional anatomy with sacralization of L5. The numbering on this exam is the same as on the MRI scan from 11/19/2021.    One or more dose reduction techniques were used (e.g., Automated exposure control, adjustment of the mA and/or kV according to patient size, use of iterative reconstruction technique).    Radiologist  location ID: [id number]  CT THORACIC SPINE WO IV CONTRAST  Narrative: Alea Christenson    RADIOLOGIST: Ileene Hutchinson    CT THORACIC SPINE WO IV CONTRAST performed on 12/16/2021 4:15 PM    CLINICAL HISTORY: post surgery pain.  back surg april now severe pains back hips ? new fx    TECHNIQUE:  Thoracic spine CT without contrast.    COMPARISON: Thoracic spine radiographs dated 11/07/2018  # of known CTs in the past 12 months: 9   # of known Cardiac Nuclear Medicine Studies in the past 12 months: 0    FINDINGS:  Alignment: Normal.    Bones: There is increased sclerosis of the T3 superior endplate without frank depression. There is a nonacute mild compression deformity at T12 status post vertebroplasty.    At T9-T10 there is a central disc protrusion resulting in at least mild central canal stenosis.    Soft Tissues:   Unremarkable paraspinal tissues.    Other: Visualized portions of the lungs are unremarkable.  Impression: No acute fracture or traumatic malalignment    Nonacute T12 compression deformity status post, kyphoplasty    Age-indeterminate T9-10 central disc protrusion with at least mild central canal narrowing.    One or more dose reduction techniques were used (e.g., Automated exposure control, adjustment of the mA and/or kV according to patient size, use of iterative reconstruction technique).    Radiologist location ID: [id number]  XR CHEST PA AND LATERAL  Narrative: Kindal Mucci    RADIOLOGIST: Karna Dupes    XR CHEST PA AND LATERAL performed on 12/16/2021 2:17 PM    CLINICAL HISTORY: Chest Pain.  chest pain today     TECHNIQUE: Frontal and lateral views of the chest.    COMPARISON:  04/25/2020    FINDINGS:  Lower lung volumes are present on today's study and may in part be related to phase of inspiration.     The cardiac silhouette remains borderline enlarged with postsurgical changes related to sternotomy again seen.  Trachea remains near midline. Mild central vascular congestion is suggested but may  be exaggerated by lower lung volumes on today's study with crowding.  No fluid in the fissures is seen. No airspace consolidation, pleural effusion or pneumothorax is identified.   Impression: Stable postsurgical borderline cardiomegaly with suggestion of mild central vascular congestion when compared to 04/25/2020.    Radiologist location ID: [id number]       Assessment/Plan:   Active Hospital Problems    Diagnosis   . Primary Problem: Chest pain   . Back arthralgia   . Weakness   . Osteoporosis     Patient to be placed on telemetry.  Will get physical therapy to work patient.  Patient placed on hospitalist sliding scale for diabetes.  Repeat labs ordered for the a.m.Marland Kitchen  Patient may benefit  from MRI of the back if pain does not reduce.  Daughter states she is having issues taking care of her at home.  This may be reason to bring case management on board for possible placement.  Patient be followed by hospitalist.    DVT/PE Prophylaxis:  Eliquis    Marlaine Hind, FNP-BC  This note was partially generated using MModal Fluency Direct system, and there may be some incorrect words, spellings, and punctuation that were not noted in checking the note before saving.

## 2021-12-17 ENCOUNTER — Inpatient Hospital Stay (HOSPITAL_COMMUNITY): Payer: Medicare Other

## 2021-12-17 ENCOUNTER — Encounter (HOSPITAL_COMMUNITY): Payer: Self-pay | Admitting: Internal Medicine

## 2021-12-17 DIAGNOSIS — M549 Dorsalgia, unspecified: Secondary | ICD-10-CM

## 2021-12-17 DIAGNOSIS — Z7901 Long term (current) use of anticoagulants: Secondary | ICD-10-CM | POA: Diagnosis present

## 2021-12-17 DIAGNOSIS — Z48811 Encounter for surgical aftercare following surgery on the nervous system: Secondary | ICD-10-CM

## 2021-12-17 DIAGNOSIS — Z86718 Personal history of other venous thrombosis and embolism: Secondary | ICD-10-CM

## 2021-12-17 DIAGNOSIS — F0390 Unspecified dementia without behavioral disturbance: Secondary | ICD-10-CM

## 2021-12-17 LAB — CBC
HCT: 36.1 % — ABNORMAL LOW (ref 37.0–47.0)
HGB: 12.3 g/dL — ABNORMAL LOW (ref 12.5–16.0)
MCH: 30.5 pg (ref 27.0–32.0)
MCHC: 34 g/dL (ref 32.0–36.0)
MCV: 89.7 fL (ref 78.0–99.0)
MPV: 6.5 fL — ABNORMAL LOW (ref 7.4–10.4)
PLATELETS: 286 10*3/uL (ref 140–440)
RBC: 4.02 10*6/uL — ABNORMAL LOW (ref 4.20–5.40)
RDW: 13.6 % (ref 11.6–14.8)
WBC: 7.7 10*3/uL (ref 4.0–10.5)
WBCS UNCORRECTED: 7.7 10*3/uL

## 2021-12-17 LAB — COMPREHENSIVE METABOLIC PANEL, NON-FASTING
ALBUMIN/GLOBULIN RATIO: 1.5 — ABNORMAL HIGH (ref 0.8–1.4)
ALBUMIN: 3.7 g/dL (ref 3.5–5.7)
ALKALINE PHOSPHATASE: 92 U/L (ref 34–104)
ALT (SGPT): 7 U/L (ref 7–52)
ANION GAP: 5 mmol/L — ABNORMAL LOW (ref 10–20)
AST (SGOT): 14 U/L (ref 13–39)
BILIRUBIN TOTAL: 0.5 mg/dL (ref 0.3–1.2)
BUN/CREA RATIO: 23 — ABNORMAL HIGH (ref 6–22)
BUN: 17 mg/dL (ref 7–25)
CALCIUM, CORRECTED: 9 mg/dL (ref 8.9–10.8)
CALCIUM: 8.7 mg/dL (ref 8.6–10.3)
CHLORIDE: 95 mmol/L — ABNORMAL LOW (ref 98–107)
CO2 TOTAL: 30 mmol/L (ref 21–31)
CREATININE: 0.73 mg/dL (ref 0.60–1.30)
ESTIMATED GFR: 83 mL/min/{1.73_m2} (ref >59)
GLOBULIN: 2.5 — ABNORMAL LOW (ref 2.9–5.4)
GLUCOSE: 221 mg/dL — ABNORMAL HIGH (ref 74–109)
OSMOLALITY, CALCULATED: 269 mOsm/kg — ABNORMAL LOW (ref 270–290)
POTASSIUM: 4.8 mmol/L (ref 3.5–5.1)
PROTEIN TOTAL: 6.2 g/dL — ABNORMAL LOW (ref 6.4–8.9)
SODIUM: 130 mmol/L — ABNORMAL LOW (ref 136–145)

## 2021-12-17 LAB — MAGNESIUM
MAGNESIUM: 1.5 mg/dL — ABNORMAL LOW (ref 1.9–2.7)
MAGNESIUM: 1.9 mg/dL (ref 1.9–2.7)

## 2021-12-17 LAB — POC BLOOD GLUCOSE (RESULTS)
GLUCOSE, POC: 204 mg/dl (ref 50–500)
GLUCOSE, POC: 243 mg/dl (ref 50–500)
GLUCOSE, POC: 284 mg/dl (ref 50–500)
GLUCOSE, POC: 315 mg/dl (ref 50–500)
GLUCOSE, POC: 410 mg/dl (ref 50–500)

## 2021-12-17 LAB — TROPONIN-I: TROPONIN I: 8 ng/L (ref <15)

## 2021-12-17 LAB — BLUE TOP TUBE

## 2021-12-17 MED ORDER — INSULIN GLARGINE 100 UNITS/ML SUBQ - CHARGE BY DOSE
5.0000 [IU] | SUBCUTANEOUS | Status: DC
Start: 2021-12-19 — End: 2021-12-17

## 2021-12-17 MED ORDER — ATORVASTATIN 10 MG TABLET
ORAL_TABLET | ORAL | Status: AC
Start: 2021-12-17 — End: 2021-12-17
  Filled 2021-12-17: qty 2

## 2021-12-17 MED ORDER — DOCUSATE SODIUM 100 MG CAPSULE
100.0000 mg | ORAL_CAPSULE | Freq: Two times a day (BID) | ORAL | Status: DC
Start: 2021-12-17 — End: 2021-12-20
  Administered 2021-12-17 – 2021-12-20 (×7): 100 mg via ORAL
  Filled 2021-12-17 (×7): qty 1

## 2021-12-17 MED ORDER — MAGNESIUM SULFATE 1 GRAM/100 ML IN DEXTROSE 5 % INTRAVENOUS PIGGYBACK
1.0000 g | INJECTION | Freq: Once | INTRAVENOUS | Status: DC
Start: 2021-12-17 — End: 2021-12-17

## 2021-12-17 MED ORDER — INSULIN GLARGINE 100 UNITS/ML SUBQ - CHARGE BY DOSE
5.0000 [IU] | SUBCUTANEOUS | Status: AC
Start: 2021-12-18 — End: 2021-12-17
  Administered 2021-12-17: 5 [IU] via SUBCUTANEOUS

## 2021-12-17 MED ORDER — APIXABAN 5 MG TABLET
ORAL_TABLET | ORAL | Status: AC
Start: 2021-12-17 — End: 2021-12-17
  Filled 2021-12-17: qty 1

## 2021-12-17 MED ORDER — MAGNESIUM OXIDE 400 MG (241.3 MG MAGNESIUM) TABLET
ORAL_TABLET | ORAL | Status: AC
Start: 2021-12-17 — End: 2021-12-17
  Filled 2021-12-17: qty 1

## 2021-12-17 MED ORDER — MORPHINE 2 MG/ML INJECTION WRAPPER
1.0000 mg | INJECTION | Freq: Once | INTRAMUSCULAR | Status: AC
Start: 2021-12-17 — End: 2021-12-17
  Administered 2021-12-17: 1 mg via INTRAVENOUS
  Filled 2021-12-17: qty 1

## 2021-12-17 NOTE — ED Nurses Note (Signed)
REPORT CALLED TO MELISSA, RN ON 3 EAST AT THIS TIME.

## 2021-12-17 NOTE — PT Evaluation (Signed)
[facility]  [place], [address]  [phone]  [phone]  Rehabilitation Services  Physical Therapy Inpatient Initial Evaluation    Patient Name: Jordan Buckley  Date of Birth: May 01, 1941  Height: Height: 157.5 cm ('5\' 2"'$ )  Weight: Weight: 63.5 kg (140 lb)  Room/Bed: 329/A  Payor: MEDICARE / Plan: MEDICARE PART A AND B / Product Type: Medicare /       PMH:  Past Medical History:   Diagnosis Date    Alzheimer's dementia (CMS [place])     Arthropathy     Cancer (CMS [place])     Coronary artery disease     CVA (cerebrovascular accident) (CMS [place])     Diabetes mellitus, type 2 (CMS [facility])     Hard of hearing     HTN (hypertension)     Osteoporosis     Thyroid disease     Wears glasses            Assessment:      (P) Patient participated in PT evaluation and tolerated fair but had an increase in back and hip pain with mobility, transfers and movement. She will need pain better managed before she is able to fully participate in therapy, however, it is important that she participate to prevent loss of abilities. Will progress as tolerated and begin ambulation when she can tolerate.    Discharge Needs:    Equipment Recommendation: (P) none anticipated      The patient presents with mobility limitations due to impaired functional activity tolerance and severe pain in back and hips  that significantly impair/prevent patient's ability to participate in mobility-related activities of daily living (MRADLs) including  ambulation and transfers in order to safely complete, toileting, bathing, safely entering/exiting the home. This functional mobility deficit can be sufficiently resolved with the use of a (P) none anticipated  in order to decrease the risk of falls, morbidity, and mortality in performance of these MRADLs.  Patient is able to safely use this assistive device.    Discharge Disposition: (P) TBD (possibility of placement or return to home with assist of  daughter)    [place]   Based on current diagnosis, functional performance prior to admission, and current functional performance, this patient requires continued PT services in (P) TBD (possibility of placement or return to home with assist of daughter) in order to achieve significant functional improvements in these deficit areas: (P) arousal, attention, and cognition, gait, locomotion, and balance, other (see comments) (significant pain).        Plan:   Current Intervention: (P) bed mobility training, gait training, patient/family education, transfer training  To provide physical therapy services (P) other (see comments) (1-2x daily at least one day weekly)  for duration of (P) until discharge.    The risks/benefits of therapy have been discussed with the patient/caregiver and he/she is in agreement with the established plan of care.       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[place])     Arthropathy     Cancer (CMS [place])     Coronary artery disease     CVA (cerebrovascular accident) (CMS [place])     Diabetes mellitus, type 2 (CMS HCC)     Hard of hearing     HTN (hypertension)     Osteoporosis     Thyroid disease  Wears glasses             Past Surgical History:   Procedure Laterality Date    FIXATION KYPHOPLASTY Right 11/27/2021    HX BACK SURGERY      HX CATARACT REMOVAL      HX CESAREAN SECTION      HX CHOLECYSTECTOMY      HX CORONARY ARTERY BYPASS GRAFT      HX HYSTERECTOMY      KIDNEY SURGERY Left     cancerous tumor removed    LIVER RESECTION      WRIST SURGERY Left         12/17/21 1005   Rehab Session   Document Type evaluation   Total PT Minutes: 25   Patient Effort good   Symptoms Noted During/After Treatment increased pain   Symptoms Noted Comment in right hip area   General Information   Patient Profile Reviewed yes   Pertinent History of Current Functional Problem Patient came to the ED on 12/16/21 qith chest pain  and mid/lower back pain which extends into hips and affects her ability to walk x3 days. She had a kyphoplasty of a lumbar vertebra 1 month ago. Has hx of severe osteoporosis and dementia. She was admitted with chest pain, back arthralgia, osteoporosis and weakness. Order for PT evaluation was received.   Medical Lines PIV Line;Telemetry   Respiratory Status nasal cannula   Existing Precautions/Restrictions hard of hearing;fall precautions;other (see comments)  (significant back pain into hips.)   Mutuality/Individual Preferences   Anxieties, Fears or Concerns Pain and feeling that she can not stand any more pain   Individualized Care Needs Pain Management, Physical Therapy   Living Environment   Lives With alone   Living Arrangements house   Living Environment Comment Lives alone in a single level home with ramp. Her daughter has been staying with her for the last week since her increased pain and physical decline.  Normally the daughter checks in multiple times throughout the day to assist with meal preparation or assist with higher level activities but does not stay all the time.   Functional Level Prior   Ambulation 1 - assistive equipment   Transferring 1 - assistive equipment   Toileting 1 - assistive equipment   Bathing 3 - assistive equipment and person   Dressing 0 - independent   Eating 0 - independent   Communication 0 - understands/communicates without difficulty   Prior Functional Level Comment Prior to decline, she was independent walking with rollator walker and completed own toileting in the bathroom, dressing and could heat up food in the microwave that daughter had provided. She used BSC at night for safety. Since decline she has needed assistance with all activities and has been using the [facility] all the time.   Pre Treatment Status   Pre Treatment Patient Status Patient supine in bed   Support Present Pre Treatment  Family present  (daughter)   Communication Pre Treatment  Nurse   Communication Pre  Treatment Comment Cleared patient to participate in PT evaluation   Cognitive Assessment/Interventions   Behavior/Mood Observations confused;other (see comments);cooperative  (drowsy)   Orientation Status oriented to;person;place   Attention distractible  (due to pain)   Follows Commands follows one step commands   Vital Signs   Pre-Treatment Heart Rate (beats/min) 76   Post-treatment Heart Rate (beats/min) 80   Pre SpO2 (%) 95   O2 Delivery Pre Treatment supplemental O2   Post SpO2 (%) 96  O2 Delivery Post Treatment supplemental O2   Vitals Comment O2 at 2 L/min via NC   Pain Assessment   Pre/Posttreatment Pain Comment Unable to rate pain on numerical scale but from signs of pain from verbal expressions, facial grimace and patient crying, therapist rates it a 10/10 on Face Scale at it's worse. It did fluctuate throughout evaluation but was worse when sitting at EOB.   RUE Assessment   RUE Assessment WFL- Within Functional Limits   LUE Assessment   LUE Assessment WFL- Within Functional Limits   RLE Assessment   RLE Assessment WFL- Within Functional Limits   LLE Assessment   LLE Assessment WFL- Within Functional Limits   Trunk Assessment   Trunk Assessment WFL-Within Functional Limits   Bed Mobility Assessment/Treatment   Bed Mobility, Assistive Device bed rails   Supine-Sit [place] minimum assist (75% patient effort)   Sit to Supine, [place] minimum assist (75% patient effort)   Roll Left [place] independent   Roll Right Independence independent   Scoot/Bridge Independence independent   Transfer Assessment/Treatment   Sit-Stand [place] contact guard assist   Stand-Sit [place] contact guard assist   Sit-Stand-Sit, Assist Device walker, front wheeled   Bed-Chair [place] minimum assist (75% patient effort)   Chair-Bed Independence minimum assist (75% patient effort)   Bed-Chair-Bed Assist Device walker, front wheeled   Toilet Transfer Independence contact guard assist  (from [facility])   Toilet  Transfer Assist Device walker, front wheeled   Gait Assessment/Treatment   Independence  not tested   Comment Patient was in too great of pain after transferring and using BSC, that she was unable to ambulate. She has the physical capability, however, she is limited by pain.   Post Treatment Status   Post Treatment Patient Status Patient supine in bed   Support Present Post Treatment  None   Communication Post Treatement Nurse   Communication Post Treatment Comment Updated nurse on patient response to evaluation and high level of pain she experienced with movement.   Physical Therapy Clinical Impression   Assessment Patient participated in PT evaluation and tolerated fair but had an increase in back and hip pain with mobility, transfers and movement. She will need pain better managed before she is able to fully participate in therapy, however, it is important that she participate to prevent loss of abilities. Will progress as tolerated and begin ambulation when she can tolerate.   Criteria for Skilled Therapeutic meets criteria   Impairments Found (describe specific impairments) arousal, attention, and cognition;gait, locomotion, and balance;other (see comments)  (significant pain)   Rehab Potential good  (with pain control and management)   Therapy Frequency other (see comments)  (1-2x daily at least one day weekly)   Predicted Duration of Therapy Intervention (days/wks) until discharge   Anticipated Equipment Needs at Discharge (PT) none anticipated   Anticipated Discharge Disposition TBD  (possibility of placement or return to home with assist of daughter)   Evaluation Complexity Justification   Patient History: Co-morbidity/factors that impact Plan of Care 1-2 that impact Plan of Care   Examination Components 1-2 Exam elements addressed   Presentation Evolving: Symptoms, complaints, characteristics of condition changing &/or cognitive deficits present   Clinical Decision Making Moderate complexity   Evaluation  Complexity Moderate complexity   Care Plan Goals   PT Rehab Goals Bed Mobility Goal;Gait Training Goal;Transfer Training Goal   Bed Mobility Goal   Bed Mobility Goal, Date Established 12/17/21   Bed Mobility Goal, Time to Achieve by discharge  Bed Mobility Goal, Activity Type all bed mobility activities   Bed Mobility Goal, Independence Level supervision required   Bed Mobility Goal, Assistive Device   (no AD)   Gait Training  Goal, Distance to Achieve   Gait Training  Goal, Date Established 12/17/21   Gait Training  Goal, Time to Achieve by discharge   Gait Training  Goal, Independence Level stand-by assistance   Gait Training  Goal, Assist Device walker, rolling   Gait Training  Goal, Distance to Achieve 150 feet   Transfer Training Goal   Transfer Training Goal, Date Established 12/17/21   Transfer Training Goal, Time to Achieve by discharge   Transfer Training Goal, Activity Type sit-to-stand/stand-to-sit;bed-to-chair/chair-to-bed;toilet   Transfer Training Goal, [place] Level stand-by assistance   Transfer Training Goal, Assist Device walker, rolling   Planned Therapy Interventions, PT Eval   Planned Therapy Interventions (PT) bed mobility training;gait training;patient/family education;transfer training               INTERVENTION MINUTES: EVALUATION 15 minutes and THERAPEUTIC ACTIVITY 10 minutes    EVALUATION COMPLEXITY : CLINICAL DECISION MAKING OF MODERATE COMPLEXITY AS INDICATED BY PMHX, PHYSICAL THERAPY ASSESSMENT OF MUSCULOSKELETAL AND NEUROLOGICAL SYSTEMS AND ACTIVITY LIMITATIONS. CLINICAL PRESENTATION HAS CHANGING CHARACTERISTICS.    Therapist:     Geronimo Boot, PT  12/17/2021, 18:17

## 2021-12-17 NOTE — Progress Notes (Signed)
[facility]  IP PROGRESS NOTE      Jordan Buckley, Jordan Buckley  Date of Admission:  12/16/2021  Date of Birth:  [birth year]  Date of Service:  12/17/2021    Hospital Day:  LOS: 1 day       Subjective:   Pt seen and examined. She appears to be resting comfortably. She awakens to stimuli but is confused due to hx of dementia and is thus a poor historian. Daughter (primary caretaker) is at bedside and has no new concerns. States pain has been adequately controlled and pt has not endorsed cp. No overnight events per nursing.  Vital Signs:  Temp (24hrs) Max:36.8 C (98.3 F)      Temperature: 36.3 C (97.3 F)  BP (Non-Invasive): (!) 147/87  MAP (Non-Invasive): 96 mmHG  Heart Rate: 74  Respiratory Rate: 18  SpO2: 91 %    Current Medications:  acetaminophen (TYLENOL) tablet, 650 mg, Oral, Q4H PRN  apixaban (ELIQUIS) tablet, 5 mg, Oral, 2x/day  atorvastatin (LIPITOR) tablet, 20 mg, Oral, QPM  dextrose 50% (0.5 g/mL) injection - syringe, 25 g, Intravenous, Q15 Min PRN   Or  dextrose 50% (0.5 g/mL) injection - syringe, 12.5 g, Intravenous, Q15 Min PRN   Or  glucagon (GLUCAGEN DIAGNOSTIC KIT) injection 1 mg, 1 mg, Subcutaneous, Q15 Min PRN   Or  glucagon (GLUCAGEN DIAGNOSTIC KIT) injection 1 mg, 1 mg, IntraMUSCULAR, Q15 Min PRN  diphenhydrAMINE (BENADRYL) capsule, 25 mg, Oral, HS PRN  docusate sodium (COLACE) capsule, 100 mg, Oral, 2x/day  ferrous sulfate 324 mg (65 mg elemental IRON) tablet, 324 mg, Oral, Daily before Breakfast  HYDROcodone-acetaminophen (NORCO) 5-325 mg per tablet, 1 Tablet, Oral, Q6H PRN  ipratropium-albuterol 0.5 mg-3 mg(2.5 mg base)/3 mL Solution for Nebulization, 3 mL, Nebulization, Q4H PRN  levothyroxine (SYNTHROID) tablet, 75 mcg, Oral, QAM  magnesium oxide (MAG-OX) 460m (2457melemental magnesium) tablet, 400 mg, Oral, 2x/day  ondansetron (ZOFRAN ODT) rapid dissolve tablet, 4 mg, Oral, Q8H PRN  pramipexole (MIRAPEX) tablet, 0.125 mg, Oral, 3x/day  SSIP insulin R human (HUMULIN R) 100 units/mL  injection, 0-12 Units, Subcutaneous, 4x/day PRN        Current Orders:  Active Orders   Lab    CBC     Frequency: ONE TIME     Number of Occurrences: 1 Occurrences    CBC     Frequency: ONE TIME     Number of Occurrences: 1 Occurrences    COMPREHENSIVE METABOLIC PANEL, NON-FASTING     Frequency: ONE TIME     Number of Occurrences: 1 Occurrences    COMPREHENSIVE METABOLIC PANEL, NON-FASTING     Frequency: ONE TIME     Number of Occurrences: 1 Occurrences    MAGNESIUM     Frequency: ONE TIME     Number of Occurrences: 1 Occurrences    MAGNESIUM     Frequency: ONE TIME     Number of Occurrences: 1 Occurrences    MAGNESIUM     Frequency: ONE TIME     Number of Occurrences: 1 Occurrences    URINALYSIS, MACROSCOPIC     Frequency: Once     Number of Occurrences: 1 Occurrences    URINALYSIS, MACROSCOPIC AND MICROSCOPIC W/CULTURE REFLEX [PRN ONLY]     Frequency: ONE TIME     Number of Occurrences: 1 Occurrences    URINALYSIS, MICROSCOPIC     Frequency: Once     Number of Occurrences: 1 Occurrences   Diet    DIET CARDIAC (2G NA,  LOWFAT, LOW CHOL) Do you want to initiate MNT Protocol? Yes; Calorie amount: CC 2000     Frequency: All Meals     Number of Occurrences: 1 Occurrences   Nursing    ACTIVITY Activity: OOB WITH ASSISTANCE     Frequency: UNTIL DISCONTINUED     Number of Occurrences: Until Specified    ACTIVITY Activity: UP IN CHAIR     Frequency: UNTIL DISCONTINUED     Number of Occurrences: Until Specified    APIXABAN NURSING ORDER     Frequency: UNTIL DISCONTINUED     Number of Occurrences: Until Specified     Order Comments: Nursing Instructions:    1.  Print apixaban (Eliquis) guide using the link in this order.   2.  Nurse to provide apixaban patient guide to all patients and be sure that all new starts have received education by the trained anticoagulation educator.  If additional questions, contact pharmacy.           APPLY SEQUENTIAL COMPRESSION DEVICE     Frequency: ONE TIME     Number of Occurrences: 1  Occurrences    CONTINUOUS CARDIAC MONITORING (ED USE ONLY)     Frequency: ONE TIME     Number of Occurrences: 1 Occurrences    EXTERNAL CATHETER     Frequency: PRN     Number of Occurrences: Until Specified    INTAKE AND OUTPUT QSHIFT     Frequency: QSHIFT     Number of Occurrences: Until Specified    MAINTAIN SEQUENTIAL COMPRESSION DEVICE     Frequency: CONTINUOUS X 72 HRS     Number of Occurrences: 60 Hours    NOTIFY MD     Frequency: PRN     Number of Occurrences: Until Specified    Notify MD Vital Signs     Frequency: PRN     Number of Occurrences: Until Specified    PT IS MEDIUM RISK FOR VENOUS THROMBOEMBOLISM     Frequency: CONTINUOUS     Number of Occurrences: Until Specified    PULSE OXIMETRY Q4H     Frequency: Q4H     Number of Occurrences: Until Specified    TELEMETRY MONITORING - Continuous     Frequency: CONTINUOUS     Number of Occurrences: Until Specified    VITAL SIGNS Q4H     Frequency: Q4H     Number of Occurrences: Until Specified    WAS PATIENT ON APIXABAN PRIOR TO ADMISSION?     Frequency: UNTIL DISCONTINUED     Number of Occurrences: Until Specified   Code Status    FULL CODE     Frequency: CONTINUOUS     Number of Occurrences: Until Specified   Consult    IP CONSULT TO CARE MANAGEMENT     Frequency: ONE TIME     Number of Occurrences: 1 Occurrences   PT    PT EVALUATE AND TREAT     Frequency: Per Therapist Discretion     Number of Occurrences: 1 Occurrences     Order Comments: If patient's O2 level drops, PT may increase O2 until sats are > 93%.       Scheduling Instructions:               Point of Care Testing    PERFORM POC WHOLE BLOOD GLUCOSE     Frequency: TID AC & HS     Number of Occurrences: Until Specified   ECHO    TRANSTHORACIC ECHOCARDIOGRAM -  ADULT     Frequency: ONE TIME     Number of Occurrences: 1 Occurrences     Scheduling Instructions:               Medications    acetaminophen (TYLENOL) tablet     Frequency: Q4H PRN     Dose: 650 mg     Route: Oral    apixaban (ELIQUIS) tablet      Frequency: 2x/day     Dose: 5 mg     Route: Oral    atorvastatin (LIPITOR) tablet     Frequency: QPM     Dose: 20 mg     Route: Oral    dextrose 50% (0.5 g/mL) injection - syringe     Linked Order: Or     Frequency: Q15 Min PRN     Dose: 25 g     Route: Intravenous    dextrose 50% (0.5 g/mL) injection - syringe     Linked Order: Or     Frequency: Q15 Min PRN     Dose: 12.5 g     Route: Intravenous    diphenhydrAMINE (BENADRYL) capsule     Frequency: HS PRN     Dose: 25 mg     Route: Oral    docusate sodium (COLACE) capsule     Frequency: 2x/day     Dose: 100 mg     Route: Oral    ferrous sulfate 324 mg (65 mg elemental IRON) tablet     Frequency: Daily before Breakfast     Dose: 324 mg     Route: Oral    glucagon (GLUCAGEN DIAGNOSTIC KIT) injection 1 mg     Linked Order: Or     Frequency: Q15 Min PRN     Dose: 1 mg     Route: Subcutaneous    glucagon (GLUCAGEN DIAGNOSTIC KIT) injection 1 mg     Linked Order: Or     Frequency: Q15 Min PRN     Dose: 1 mg     Route: IntraMUSCULAR    HYDROcodone-acetaminophen (NORCO) 5-325 mg per tablet     Frequency: Q6H PRN     Dose: 1 Tablet     Route: Oral    ipratropium-albuterol 0.5 mg-3 mg(2.5 mg base)/3 mL Solution for Nebulization     Frequency: Q4H PRN     Dose: 3 mL     Route: Nebulization    levothyroxine (SYNTHROID) tablet     Frequency: QAM     Dose: 75 mcg     Route: Oral    magnesium oxide (MAG-OX) 460m (2474melemental magnesium) tablet     Frequency: 2x/day     Dose: 400 mg     Route: Oral    ondansetron (ZOFRAN ODT) rapid dissolve tablet     Frequency: Q8H PRN     Dose: 4 mg     Route: Oral    pramipexole (MIRAPEX) tablet     Frequency: 3x/day     Dose: 0.125 mg     Route: Oral    SSIP insulin R human (HUMULIN R) 100 units/mL injection     Frequency: 4x/day PRN     Dose: 0-12 Units     Route: Subcutaneous        Review of Systems:  ROS unavailable due to dementia     Today's Physical Exam:  Physical Exam  Constitutional:       General: She is not in acute  distress.  Appearance: She is not ill-appearing.   Cardiovascular:      Rate and Rhythm: Normal rate and regular rhythm.   Pulmonary:      Effort: Pulmonary effort is normal. No respiratory distress.      Breath sounds: Normal breath sounds. No wheezing.   Abdominal:      General: Abdomen is flat. Bowel sounds are normal. There is no distension.      Palpations: Abdomen is soft.      Tenderness: There is no abdominal tenderness.   Musculoskeletal:         General: No swelling.   Skin:     General: Skin is warm and dry.   Neurological:      General: No focal deficit present.      Mental Status: She is alert. She is disoriented.              I/O:  I/O last 24 hours:      Intake/Output Summary (Last 24 hours) at 12/17/2021 1200  Last data filed at 12/17/2021 0600  Gross per 24 hour   Intake 1540 ml   Output --   Net 1540 ml     I/O current shift:  No intake/output data recorded.      Labs  Please indicate ordered or reviewed)  Reviewed: I have reviewed all lab results.  Lab Results Today:    Results for orders placed or performed during the hospital encounter of 12/16/21 (from the past 24 hour(s))   ECG 12 LEAD   Result Value Ref Range    Ventricular rate 74 BPM    Atrial Rate 74 BPM    PR Interval 156 ms    QRS Duration 82 ms    QT Interval 368 ms    QTC Calculation 408 ms    Calculated P Axis -20 degrees    Calculated R Axis 76 degrees    Calculated T Axis 92 degrees   COMPREHENSIVE METABOLIC PANEL, NON-FASTING   Result Value Ref Range    SODIUM 129 (L) 136 - 145 mmol/L    POTASSIUM 4.7 3.5 - 5.1 mmol/L    CHLORIDE 93 (L) 98 - 107 mmol/L    CO2 TOTAL 31 21 - 31 mmol/L    ANION GAP 5 (L) 10 - 20 mmol/L    BUN 13 7 - 25 mg/dL    CREATININE 0.76 0.60 - 1.30 mg/dL    BUN/CREA RATIO 17 6 - 22    ESTIMATED GFR 79 >59 mL/min/1.52m2    ALBUMIN 3.9 3.5 - 5.7 g/dL    CALCIUM 9.0 8.6 - 10.3 mg/dL    GLUCOSE 377 (H) 74 - 109 mg/dL    ALKALINE PHOSPHATASE 83 34 - 104 U/L    ALT (SGPT) <7 (L) 7 - 52 U/L    AST (SGOT) 11 (L) 13 -  39 U/L    BILIRUBIN TOTAL 0.7 0.3 - 1.2 mg/dL    PROTEIN TOTAL 6.6 6.4 - 8.9 g/dL    ALBUMIN/GLOBULIN RATIO 1.4 0.8 - 1.4    OSMOLALITY, CALCULATED 275 270 - 290 mOsm/kg    CALCIUM, CORRECTED 9.1 8.9 - 10.8 mg/dL    GLOBULIN 2.7 (L) 2.9 - 5.4   TROPONIN-I NOW   Result Value Ref Range    TROPONIN I 8 <15 ng/L   B-TYPE NATRIURETIC PEPTIDE   Result Value Ref Range    BNP 96 5 - 100 pg/mL   MAGNESIUM   Result Value Ref Range    MAGNESIUM  1.5 (L) 1.9 - 2.7 mg/dL   PT/INR   Result Value Ref Range    PROTHROMBIN TIME 11.7 9.8 - 12.7 seconds    INR 1.01 <=5.00   PTT (PARTIAL THROMBOPLASTIN TIME)   Result Value Ref Range    APTT 34.1 26.0 - 36.0 seconds   CBC WITH DIFF   Result Value Ref Range    WBCS UNCORRECTED 6.0 x10^3/uL    WBC 6.0 4.0 - 10.5 x10^3/uL    RBC 4.33 4.20 - 5.40 x10^6/uL    HGB 13.2 12.5 - 16.0 g/dL    HCT 38.8 37.0 - 47.0 %    MCV 89.5 78.0 - 99.0 fL    MCH 30.4 27.0 - 32.0 pg    MCHC 33.9 32.0 - 36.0 g/dL    RDW 13.7 11.6 - 14.8 %    PLATELETS 310 140 - 440 x10^3/uL    MPV 6.6 (L) 7.4 - 10.4 fL    NEUTROPHIL % 77 (H) 40 - 76 %    LYMPHOCYTE % 16 (L) 25 - 45 %    MONOCYTE % 6 0 - 12 %    EOSINOPHIL % 1 0 - 7 %    BASOPHIL % 1 0 - 3 %    NEUTROPHIL # 4.70 1.80 - 8.40 x10^3/uL    LYMPHOCYTE # 0.90 (L) 1.10 - 5.00 x10^3/uL    MONOCYTE # 0.40 0.00 - 1.30 x10^3/uL    EOSINOPHIL # 0.00 0.00 - 0.80 x10^3/uL    BASOPHIL # 0.00 0.00 - 0.30 x10^3/uL   TROPONIN-I IN ONE HOUR   Result Value Ref Range    TROPONIN I 7 <15 ng/L   MAGNESIUM   Result Value Ref Range    MAGNESIUM 1.5 (L) 1.9 - 2.7 mg/dL   POC BLOOD GLUCOSE (RESULTS)   Result Value Ref Range    GLUCOSE, POC 499 50 - 500 mg/dl   POC BLOOD GLUCOSE (RESULTS)   Result Value Ref Range    GLUCOSE, POC 513 (HH) 50 - 500 mg/dl   POC BLOOD GLUCOSE (RESULTS)   Result Value Ref Range    GLUCOSE, POC 519 (HH) 50 - 500 mg/dl   POC BLOOD GLUCOSE (RESULTS)   Result Value Ref Range    GLUCOSE, POC 243 50 - 500 mg/dl   COMPREHENSIVE METABOLIC PANEL, NON-FASTING   Result  Value Ref Range    SODIUM 130 (L) 136 - 145 mmol/L    POTASSIUM 4.8 3.5 - 5.1 mmol/L    CHLORIDE 95 (L) 98 - 107 mmol/L    CO2 TOTAL 30 21 - 31 mmol/L    ANION GAP 5 (L) 10 - 20 mmol/L    BUN 17 7 - 25 mg/dL    CREATININE 0.73 0.60 - 1.30 mg/dL    BUN/CREA RATIO 23 (H) 6 - 22    ESTIMATED GFR 83 >59 mL/min/1.36m2    ALBUMIN 3.7 3.5 - 5.7 g/dL    CALCIUM 8.7 8.6 - 10.3 mg/dL    GLUCOSE 221 (H) 74 - 109 mg/dL    ALKALINE PHOSPHATASE 92 34 - 104 U/L    ALT (SGPT) 7 7 - 52 U/L    AST (SGOT) 14 13 - 39 U/L    BILIRUBIN TOTAL 0.5 0.3 - 1.2 mg/dL    PROTEIN TOTAL 6.2 (L) 6.4 - 8.9 g/dL    ALBUMIN/GLOBULIN RATIO 1.5 (H) 0.8 - 1.4    OSMOLALITY, CALCULATED 269 (L) 270 - 290 mOsm/kg    CALCIUM, CORRECTED 9.0 8.9 -  10.8 mg/dL    GLOBULIN 2.5 (L) 2.9 - 5.4   TROPONIN-I   Result Value Ref Range    TROPONIN I 8 <15 ng/L   CBC   Result Value Ref Range    WBCS UNCORRECTED 7.7 x10^3/uL    WBC 7.7 4.0 - 10.5 x10^3/uL    RBC 4.02 (L) 4.20 - 5.40 x10^6/uL    HGB 12.3 (L) 12.5 - 16.0 g/dL    HCT 36.1 (L) 37.0 - 47.0 %    MCV 89.7 78.0 - 99.0 fL    MCH 30.5 27.0 - 32.0 pg    MCHC 34.0 32.0 - 36.0 g/dL    RDW 13.6 11.6 - 14.8 %    PLATELETS 286 140 - 440 x10^3/uL    MPV 6.5 (L) 7.4 - 10.4 fL   BLUE TOP TUBE   Result Value Ref Range    RAINBOW/EXTRA TUBE AUTO RESULT Yes    POC BLOOD GLUCOSE (RESULTS)   Result Value Ref Range    GLUCOSE, POC 284 50 - 500 mg/dl   POC BLOOD GLUCOSE (RESULTS)   Result Value Ref Range    GLUCOSE, POC 204 50 - 500 mg/dl           Problem List:  Active Hospital Problems   (*Primary Problem)    Diagnosis   . *Chest pain   . Dementia (CMS [place])   . Anticoagulant long-term use   . Back arthralgia   . Weakness   . Osteoporosis       Assessment/ Plan:   1) back pain-pt is s/p kyphoplasty of t12 about 2 weeks ago which helped initially but pain with radiculopathy into bilateral posterior legs has returned per daughter. Pt has had gait failure as a result and falls have not been witnessed by family but are suspected. Ct  scan shows t12 compression fx s/p kyphoplasty and L2 compression fx, not acute. Will obtain lumbar spine mri for further evaluation. Pt will need pt consult and snf placement at [place] as daughter is no longer able to care for her. Pt appears to be tolerating Norco for pain relief.     2) dementia-chronic-daughter states has gotten worse since compression fx. Pt was living at home alone with daughter next door until recently. Daughter cannot manage pt at home any longer and requests snf placement. Case management c/s placed.    3)cp-apparently resolved. Trop neg x 3. 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 Will order echo, however, further ischemic workup will be deferred to outpt unless echo is abnormal or cp is recurrent. Daughter agreeable to this plan.    4) long-term anticoagulation-hx DVT/pe    5)gait failure-pt eval pending. Family cannot completely exclude hx of falls as pt was living alone until recently. Will also obtain head ct for complete evaluation given pt is on Eliquis. snf pending at [place] catheter placed to avoid skin breakdown while pt has limited mobility.    Further recommendations depending on clinical course/test results. Care plan discussed in detail with pt's daughter at bedside until all questions were answered.   ?  The Hospitalist personally evaluated and examined the patient in conjunction with the MLP and agree with the assessments, treatment plan and disposition of the patient as recorded by the [facility].       Lovenia Kim, PA-C    DVT/PE Prophylaxis: Apixaban      Plan of care discussed as documented above.  CT head pending.    [place], [place] HOSPITALIST

## 2021-12-18 DIAGNOSIS — Z794 Long term (current) use of insulin: Secondary | ICD-10-CM

## 2021-12-18 DIAGNOSIS — M2550 Pain in unspecified joint: Secondary | ICD-10-CM

## 2021-12-18 LAB — COMPREHENSIVE METABOLIC PANEL, NON-FASTING
ALBUMIN/GLOBULIN RATIO: 1.3 (ref 0.8–1.4)
ALBUMIN: 3.8 g/dL (ref 3.5–5.7)
ALKALINE PHOSPHATASE: 86 U/L (ref 34–104)
ALT (SGPT): 8 U/L (ref 7–52)
ANION GAP: 5 mmol/L — ABNORMAL LOW (ref 10–20)
AST (SGOT): 14 U/L (ref 13–39)
BILIRUBIN TOTAL: 0.8 mg/dL (ref 0.3–1.2)
BUN/CREA RATIO: 23 — ABNORMAL HIGH (ref 6–22)
BUN: 17 mg/dL (ref 7–25)
CALCIUM, CORRECTED: 9.6 mg/dL (ref 8.9–10.8)
CALCIUM: 9.4 mg/dL (ref 8.6–10.3)
CHLORIDE: 93 mmol/L — ABNORMAL LOW (ref 98–107)
CO2 TOTAL: 32 mmol/L — ABNORMAL HIGH (ref 21–31)
CREATININE: 0.73 mg/dL (ref 0.60–1.30)
ESTIMATED GFR: 83 mL/min/{1.73_m2} (ref >59)
GLOBULIN: 2.9 (ref 2.9–5.4)
GLUCOSE: 135 mg/dL — ABNORMAL HIGH (ref 74–109)
OSMOLALITY, CALCULATED: 264 mOsm/kg — ABNORMAL LOW (ref 270–290)
POTASSIUM: 4.6 mmol/L (ref 3.5–5.1)
PROTEIN TOTAL: 6.7 g/dL (ref 6.4–8.9)
SODIUM: 130 mmol/L — ABNORMAL LOW (ref 136–145)

## 2021-12-18 LAB — CBC
HCT: 39.5 % (ref 37.0–47.0)
HGB: 13.3 g/dL (ref 12.5–16.0)
MCH: 30.1 pg (ref 27.0–32.0)
MCHC: 33.6 g/dL (ref 32.0–36.0)
MCV: 89.7 fL (ref 78.0–99.0)
MPV: 6.8 fL — ABNORMAL LOW (ref 7.4–10.4)
PLATELETS: 281 10*3/uL (ref 140–440)
RBC: 4.41 10*6/uL (ref 4.20–5.40)
RDW: 13.8 % (ref 11.6–14.8)
WBC: 6.5 10*3/uL (ref 4.0–10.5)
WBCS UNCORRECTED: 6.5 10*3/uL

## 2021-12-18 LAB — POC BLOOD GLUCOSE (RESULTS)
GLUCOSE, POC: 85 mg/dl (ref 50–500)
GLUCOSE, POC: 124 mg/dl (ref 50–500)
GLUCOSE, POC: 154 mg/dl (ref 50–500)
GLUCOSE, POC: 398 mg/dl (ref 50–500)

## 2021-12-18 LAB — MAGNESIUM: MAGNESIUM: 1.9 mg/dL (ref 1.9–2.7)

## 2021-12-18 MED ORDER — HYDROCODONE 5 MG-ACETAMINOPHEN 325 MG TABLET
1.0000 | ORAL_TABLET | ORAL | Status: DC | PRN
Start: 2021-12-18 — End: 2021-12-20
  Administered 2021-12-18 – 2021-12-20 (×13): 1 via ORAL
  Filled 2021-12-18 (×13): qty 1

## 2021-12-18 NOTE — Care Plan (Signed)
Problem: Adult Inpatient Plan of Care  Goal: Patient-Specific Goal (Individualized)  Outcome: Ongoing (see interventions/notes)  Flowsheets (Taken 12/18/2021 0327)  Plan of Care Reviewed With: patient     Problem: Fall Injury Risk  Goal: Absence of Fall and Fall-Related Injury  Outcome: Ongoing (see interventions/notes)     Problem: Skin Injury Risk Increased  Goal: Skin Health and Integrity  Outcome: Ongoing (see interventions/notes)     Problem: Adult Inpatient Plan of Care  Goal: Absence of Hospital-Acquired Illness or Injury  Intervention: Prevent Skin Injury  Recent Flowsheet Documentation  Taken 12/18/2021 0200 by Aneta Mins, RN  Body Position:   turned q 2 hours   supine, head elevated  Taken 12/18/2021 0000 by Aneta Mins, RN  Body Position:   turned q 2 hours   side lying, left  Taken 12/17/2021 2200 by Aneta Mins, RN  Body Position:   turned q 2 hours   supine, head elevated  Taken 12/17/2021 2000 by Aneta Mins, RN  Body Position:   turned q 2 hours   side lying, right   low-fowlers (15-30 degrees)  Pt c/o right hip pain and is receiving Norco with relief-frequency of pain medication changed by provider this shift due to breakthrough pain.  Repositioned frequently for comfort. Purewick in place to protect skin integrity.

## 2021-12-18 NOTE — Care Management Notes (Signed)
Jordan Buckley Initial Evaluation    Patient Name: Jordan Buckley  Date of Birth: [birth year]  Sex: female  Date/Time of Admission: 12/16/2021  1:28 PM  Room/Bed: 329/A  Payor: Jordan Buckley / Plan: Jordan Buckley PART A AND B / Product Type: Jordan Buckley /   Primary Care Providers:  Jordan Kehr, PA-C (General)    Pharmacy Info:   [id number] - [place], [place] - [place] Jordan Buckley    [address] Jordan Buckley [address]    Phone: [phone] Fax: [phone]    Hours: Not open 24 hours          Emergency Contact Info:   Extended Emergency Contact Information  Primary Emergency Contact: Jordan Buckley  Mobile Phone: [phone]  Relation: Daughter  Interpreter needed? No    History:   Jordan Buckley is a 81 y.o., female, admitted     Height/Weight: 157.5 cm ('5\' 2"'$ ) / 63.5 kg (140 lb)     LOS: 2 days   Admitting Diagnosis: Chest pain [R07.9]    Assessment:      12/18/21 1820   Assessment Details   Assessment Type Admission   Date of Care Buckley Update 12/18/21   Readmission   Is this a readmission? No   Insurance Information/Type   Insurance type Jordan Buckley   Employment/Financial   Financial Concerns none   Living Environment   Select an age group to open "lives with" row.  Adult   Lives With alone   Living Arrangements house   Able to Return to Prior Arrangements no   Custody and Legal Status   Do you have a court appointed guardian/conservator? No   Are you an emancipated minor? No   Custody Issues? No   Paternity Affidavit Requested? No   Care Buckley Plan   Discharge Planning Status initial meeting   Discharge plan discussed with: Patient;MPOA   Facility or [place]   Discharge Needs Assessment   Outpatient/Agency/Support Group Needs skilled nursing facility   Equipment Currently Used at Home Jordan, rolling;commode   Equipment Needed After Discharge none   Discharge Facility/Level of Care Needs SNF Placement  (Jordan Buckley certified)(code 3)   Transportation Available family or friend will provide   Referral Information   Admission Type inpatient   Address Verified verified-no changes   Arrived From home or self-care   ADVANCE DIRECTIVES   Does the Patient have an Advance Directive? Yes, Patient Does Have Advance Directive for Healthcare Treatment   Type of Advance Directive Completed Medical Power of Attorney   Copy of Advance Directives in Chart? 2   Name of MPOA or Jordan Buckley   Phone Number of Jordan Buckley or Healthcare Surrogate [phone]   Patient Requests Assistance in Having Advance Directive Notarized. N/A         Discharge Plan:  SNF Placement (Jordan Buckley certified) (code 3)  CM SPOKE WITH PT AND DAUGHTER/MPOA AT BEDSIDE. PT HAD BEEN LIVING AT HOME AND FAIRLY INDEPENDENT WITH DAUGHTER ASSISTING WITH MEALS AND OTHER ADLS. PT HAS BEEN WEAKER AND IS UNABLE TO STAY AT HOME ALONE AND DAUGHTER IS NOT AB;E TO PROPERLY CARE FOR HER. PT STATES THAT She WILL GO TO SNF FOR REHAB. PT IS HOPEFUL THAT SHE'LL BE ABLE TO RETURN HOME BUT DAUGHTER THINKS IT'S UNLIKELY DUE TO DEMENTIA AND OTHER HEALTH ISSUES. DAUGHTER STATES THAT SHE'LL BRING A COPY OF MPOA AND SIGNED PAS. FAMILY WOULD PREFER  [facility] BUT FAMILY UNDERSTANDS THAT THEY MAY NOT HAVE A BED WHEN PT IS MEDICALLY READY FOR DISCHARGE. DAUGHTER STATES THAT She AND HER SISTER HAVE BEEN SPEAKING WITH Jordan Buckley [employer] REGARDING MEDICAID APPLICATION. CM GAVE SIGNED PAS TO Jordan Buckley, CM.     The patient will continue to be evaluated for developing discharge needs.     Case Manager: Jordan Buckley, Jordan Buckley  Phone: [phone]

## 2021-12-18 NOTE — Nurses Notes (Signed)
Informed Christain [place] FNP of pt's blood sugar of 85, that patient received a snack of cheese and graham crackers with Lantus administration earlier in shift, and that patient drank a full cup of orange juice prior to this notification.

## 2021-12-18 NOTE — Nurses Notes (Signed)
Informed E Smith FNP of pt's elevated blood sugar of 410. Order received to cover with ssc of 12 units of regular insulin and recheck one hour after coverage.

## 2021-12-18 NOTE — Progress Notes (Signed)
[facility]  IP PROGRESS NOTE      Parveen, Freehling  Date of Admission:  12/16/2021  Date of Birth:  10/11/1940  Date of Service:  12/18/2021    Hospital Day:  LOS: 2 days       Subjective:   Pt seen and examined. She is sleeping and appears comfortable during rounds. She is easily rearousable and has no new complaints. She has dementia and is a questionable historian, however, she answers appropriately this am and is thought to be reliable today. No cp or sob. No gi/gu complaints. Only eating bites from her tray. Glucose has been elevated overnight per nursing, otherwise, no major overnight events reported.   Vital Signs:  Temp (24hrs) Max:36.8 C (98.3 F)      Temperature: 36.7 C (98.1 F)  BP (Non-Invasive): (!) 155/88  MAP (Non-Invasive): 96 mmHG  Heart Rate: 79  Respiratory Rate: 17  SpO2: 93 %    Current Medications:  acetaminophen (TYLENOL) tablet, 650 mg, Oral, Q4H PRN  apixaban (ELIQUIS) tablet, 5 mg, Oral, 2x/day  atorvastatin (LIPITOR) tablet, 20 mg, Oral, QPM  dextrose 50% (0.5 g/mL) injection - syringe, 25 g, Intravenous, Q15 Min PRN   Or  dextrose 50% (0.5 g/mL) injection - syringe, 12.5 g, Intravenous, Q15 Min PRN   Or  glucagon (GLUCAGEN DIAGNOSTIC KIT) injection 1 mg, 1 mg, Subcutaneous, Q15 Min PRN   Or  glucagon (GLUCAGEN DIAGNOSTIC KIT) injection 1 mg, 1 mg, IntraMUSCULAR, Q15 Min PRN  diphenhydrAMINE (BENADRYL) capsule, 25 mg, Oral, HS PRN  docusate sodium (COLACE) capsule, 100 mg, Oral, 2x/day  ferrous sulfate 324 mg (65 mg elemental IRON) tablet, 324 mg, Oral, Daily before Breakfast  HYDROcodone-acetaminophen (NORCO) 5-325 mg per tablet, 1 Tablet, Oral, Q4H PRN  ipratropium-albuterol 0.5 mg-3 mg(2.5 mg base)/3 mL Solution for Nebulization, 3 mL, Nebulization, Q4H PRN  levothyroxine (SYNTHROID) tablet, 75 mcg, Oral, QAM  magnesium oxide (MAG-OX) 420m (2476melemental magnesium) tablet, 400 mg, Oral, 2x/day  ondansetron (ZOFRAN ODT) rapid dissolve tablet, 4 mg, Oral, Q8H  PRN  pramipexole (MIRAPEX) tablet, 0.125 mg, Oral, 3x/day  SSIP insulin R human (HUMULIN R) 100 units/mL injection, 0-12 Units, Subcutaneous, 4x/day PRN        Current Orders:  Active Orders   Lab    CBC     Frequency: ONE TIME     Number of Occurrences: 1 Occurrences    COMPREHENSIVE METABOLIC PANEL, NON-FASTING     Frequency: ONE TIME     Number of Occurrences: 1 Occurrences    MAGNESIUM     Frequency: ONE TIME     Number of Occurrences: 1 Occurrences    URINALYSIS, MACROSCOPIC     Frequency: Once     Number of Occurrences: 1 Occurrences    URINALYSIS, MACROSCOPIC AND MICROSCOPIC W/CULTURE REFLEX [PRN ONLY]     Frequency: ONE TIME     Number of Occurrences: 1 Occurrences    URINALYSIS, MICROSCOPIC     Frequency: Once     Number of Occurrences: 1 Occurrences   Diet    DIET CARDIAC (2G NA, LOWFAT, LOW CHOL) Do you want to initiate MNT Protocol? Yes; Calorie amount: CC 2000     Frequency: All Meals     Number of Occurrences: 1 Occurrences   Nursing    ACTIVITY Activity: OOB WITH ASSISTANCE     Frequency: UNTIL DISCONTINUED     Number of Occurrences: Until Specified    ACTIVITY Activity: UP IN CHAIR  Frequency: UNTIL DISCONTINUED     Number of Occurrences: Until Specified    APIXABAN NURSING ORDER     Frequency: UNTIL DISCONTINUED     Number of Occurrences: Until Specified     Order Comments: Nursing Instructions:    1.  Print apixaban (Eliquis) guide using the link in this order.   2.  Nurse to provide apixaban patient guide to all patients and be sure that all new starts have received education by the trained anticoagulation educator.  If additional questions, contact pharmacy.           APPLY SEQUENTIAL COMPRESSION DEVICE     Frequency: ONE TIME     Number of Occurrences: 1 Occurrences    CONTINUOUS CARDIAC MONITORING (ED USE ONLY)     Frequency: ONE TIME     Number of Occurrences: 1 Occurrences    EXTERNAL CATHETER     Frequency: PRN     Number of Occurrences: Until Specified    INTAKE AND OUTPUT QSHIFT      Frequency: QSHIFT     Number of Occurrences: Until Specified    MAINTAIN SEQUENTIAL COMPRESSION DEVICE     Frequency: CONTINUOUS X 72 HRS     Number of Occurrences: 57 Hours    NOTIFY MD     Frequency: PRN     Number of Occurrences: Until Specified    Notify MD Vital Signs     Frequency: PRN     Number of Occurrences: Until Specified    PT IS MEDIUM RISK FOR VENOUS THROMBOEMBOLISM     Frequency: CONTINUOUS     Number of Occurrences: Until Specified    PULSE OXIMETRY Q4H     Frequency: Q4H     Number of Occurrences: Until Specified    TELEMETRY MONITORING - Continuous     Frequency: CONTINUOUS     Number of Occurrences: Until Specified    VITAL SIGNS Q4H     Frequency: Q4H     Number of Occurrences: Until Specified    WAS PATIENT ON APIXABAN PRIOR TO ADMISSION?     Frequency: UNTIL DISCONTINUED     Number of Occurrences: Until Specified   Code Status    FULL CODE     Frequency: CONTINUOUS     Number of Occurrences: Until Specified   Consult    IP CONSULT TO CARE MANAGEMENT     Frequency: ONE TIME     Number of Occurrences: 1 Occurrences   PT    PT EVALUATE AND TREAT     Frequency: Per Therapist Discretion     Number of Occurrences: 1 Occurrences     Order Comments: If patient's O2 level drops, PT may increase O2 until sats are > 93%.       Scheduling Instructions:               Point of Care Testing    PERFORM POC WHOLE BLOOD GLUCOSE     Frequency: TID AC & HS     Number of Occurrences: Until Specified   Medications    acetaminophen (TYLENOL) tablet     Frequency: Q4H PRN     Dose: 650 mg     Route: Oral    apixaban (ELIQUIS) tablet     Frequency: 2x/day     Dose: 5 mg     Route: Oral    atorvastatin (LIPITOR) tablet     Frequency: QPM     Dose: 20 mg     Route: Oral  dextrose 50% (0.5 g/mL) injection - syringe     Linked Order: Or     Frequency: Q15 Min PRN     Dose: 25 g     Route: Intravenous    dextrose 50% (0.5 g/mL) injection - syringe     Linked Order: Or     Frequency: Q15 Min PRN     Dose: 12.5 g      Route: Intravenous    diphenhydrAMINE (BENADRYL) capsule     Frequency: HS PRN     Dose: 25 mg     Route: Oral    docusate sodium (COLACE) capsule     Frequency: 2x/day     Dose: 100 mg     Route: Oral    ferrous sulfate 324 mg (65 mg elemental IRON) tablet     Frequency: Daily before Breakfast     Dose: 324 mg     Route: Oral    glucagon (GLUCAGEN DIAGNOSTIC KIT) injection 1 mg     Linked Order: Or     Frequency: Q15 Min PRN     Dose: 1 mg     Route: Subcutaneous    glucagon (GLUCAGEN DIAGNOSTIC KIT) injection 1 mg     Linked Order: Or     Frequency: Q15 Min PRN     Dose: 1 mg     Route: IntraMUSCULAR    HYDROcodone-acetaminophen (NORCO) 5-325 mg per tablet     Frequency: Q4H PRN     Dose: 1 Tablet     Route: Oral    ipratropium-albuterol 0.5 mg-3 mg(2.5 mg base)/3 mL Solution for Nebulization     Frequency: Q4H PRN     Dose: 3 mL     Route: Nebulization    levothyroxine (SYNTHROID) tablet     Frequency: QAM     Dose: 75 mcg     Route: Oral    magnesium oxide (MAG-OX) 425m (2449melemental magnesium) tablet     Frequency: 2x/day     Dose: 400 mg     Route: Oral    ondansetron (ZOFRAN ODT) rapid dissolve tablet     Frequency: Q8H PRN     Dose: 4 mg     Route: Oral    pramipexole (MIRAPEX) tablet     Frequency: 3x/day     Dose: 0.125 mg     Route: Oral    SSIP insulin R human (HUMULIN R) 100 units/mL injection     Frequency: 4x/day PRN     Dose: 0-12 Units     Route: Subcutaneous        Review of Systems:  Focused review of system was completed. Refer to the HPI for ROS details.     Today's Physical Exam:  Physical Exam  Constitutional:       General: She is not in acute distress.     Appearance: She is not ill-appearing.   Cardiovascular:      Rate and Rhythm: Regular rhythm. Tachycardia present.   Pulmonary:      Effort: Pulmonary effort is normal. No respiratory distress.      Breath sounds: Normal breath sounds. No wheezing or rales.   Abdominal:      General: Abdomen is flat. Bowel sounds are normal. There is  no distension.      Palpations: Abdomen is soft.      Tenderness: There is no abdominal tenderness.   Musculoskeletal:         General: No swelling.   Skin:  General: Skin is warm and dry.   Neurological:      General: No focal deficit present.      Mental Status: She is alert and oriented to person, place, and time.              I/O:  I/O last 24 hours:  No intake or output data in the 24 hours ending 12/18/21 1120  I/O current shift:  No intake/output data recorded.      Labs  Please indicate ordered or reviewed)  Reviewed: I have reviewed all lab results.  Lab Results Today:    Results for orders placed or performed during the hospital encounter of 12/16/21 (from the past 24 hour(s))   POC BLOOD GLUCOSE (RESULTS)   Result Value Ref Range    GLUCOSE, POC 204 50 - 500 mg/dl   POC BLOOD GLUCOSE (RESULTS)   Result Value Ref Range    GLUCOSE, POC 410 50 - 500 mg/dl   POC BLOOD GLUCOSE (RESULTS)   Result Value Ref Range    GLUCOSE, POC 315 50 - 500 mg/dl   POC BLOOD GLUCOSE (RESULTS)   Result Value Ref Range    GLUCOSE, POC 85 50 - 500 mg/dl   POC BLOOD GLUCOSE (RESULTS)   Result Value Ref Range    GLUCOSE, POC 124 50 - 500 mg/dl   COMPREHENSIVE METABOLIC PANEL, NON-FASTING   Result Value Ref Range    SODIUM 130 (L) 136 - 145 mmol/L    POTASSIUM 4.6 3.5 - 5.1 mmol/L    CHLORIDE 93 (L) 98 - 107 mmol/L    CO2 TOTAL 32 (H) 21 - 31 mmol/L    ANION GAP 5 (L) 10 - 20 mmol/L    BUN 17 7 - 25 mg/dL    CREATININE 0.73 0.60 - 1.30 mg/dL    BUN/CREA RATIO 23 (H) 6 - 22    ESTIMATED GFR 83 >59 mL/min/1.59m2    ALBUMIN 3.8 3.5 - 5.7 g/dL    CALCIUM 9.4 8.6 - 10.3 mg/dL    GLUCOSE 135 (H) 74 - 109 mg/dL    ALKALINE PHOSPHATASE 86 34 - 104 U/L    ALT (SGPT) 8 7 - 52 U/L    AST (SGOT) 14 13 - 39 U/L    BILIRUBIN TOTAL 0.8 0.3 - 1.2 mg/dL    PROTEIN TOTAL 6.7 6.4 - 8.9 g/dL    ALBUMIN/GLOBULIN RATIO 1.3 0.8 - 1.4    OSMOLALITY, CALCULATED 264 (L) 270 - 290 mOsm/kg    CALCIUM, CORRECTED 9.6 8.9 - 10.8 mg/dL    GLOBULIN 2.9 2.9 - 5.4    MAGNESIUM   Result Value Ref Range    MAGNESIUM 1.9 1.9 - 2.7 mg/dL   CBC   Result Value Ref Range    WBCS UNCORRECTED 6.5 x10^3/uL    WBC 6.5 4.0 - 10.5 x10^3/uL    RBC 4.41 4.20 - 5.40 x10^6/uL    HGB 13.3 12.5 - 16.0 g/dL    HCT 39.5 37.0 - 47.0 %    MCV 89.7 78.0 - 99.0 fL    MCH 30.1 27.0 - 32.0 pg    MCHC 33.6 32.0 - 36.0 g/dL    RDW 13.8 11.6 - 14.8 %    PLATELETS 281 140 - 440 x10^3/uL    MPV 6.8 (L) 7.4 - 10.4 fL   POC BLOOD GLUCOSE (RESULTS)   Result Value Ref Range    GLUCOSE, POC 154 50 - 500 mg/dl  Problem List:  Active Hospital Problems   (*Primary Problem)    Diagnosis   . *Chest pain   . Dementia (CMS [place])   . Anticoagulant long-term use   . Back arthralgia   . Weakness   . Osteoporosis       Assessment/ Plan:   1) cp-resolved. Echo shows preserved ef 30-94% with diastolic dysfunction and apical hypokinesis. Will defer ischemic workup to outpt unless cp is recurrent.   2) back arthralgia- known recent t12 compression fx s/p kyphoplasty. Mri shows new L1 fracture compared to previous study. Will discuss with IR. Continue pain control and physical therapy. snf at DC pending.  3)dementia-chronic-appears stable today. Head ct was neg for acute findings  4)diabetes-resume metformin and conservative ssi coverage as family reports pt has had episodic hypoglycemia  Further recommendations depending on clinical course/test results. The Hospitalist personally evaluated and examined the patient in conjunction with the MLP and agree with the assessments, treatment plan and disposition of the patient as recorded by the [facility].    Lovenia Kim, PA-C    DVT/PE Prophylaxis: Apixaban    Patient seen and evaluated.  Plan of care discussed as documented above.    [place], [place] HOSPITALIST

## 2021-12-18 NOTE — Nurses Notes (Signed)
Informed E Smith FNP of pt's c/o right hip pain and last dose of pain medication/ordered frequency.

## 2021-12-18 NOTE — Nurses Notes (Signed)
Patients blood sugar at 1630 noted to be 124.

## 2021-12-18 NOTE — Nurses Notes (Signed)
Informed Jordan Smith FNP of pt's blood sugar of 315.

## 2021-12-18 NOTE — Care Management Notes (Signed)
[facility] PAS APPLICATION  RECORD ID #:  [id number]    LEVEL 1 APPROVED  LEVEL 2 NOT REQUIRED

## 2021-12-19 DIAGNOSIS — Z7984 Long term (current) use of oral hypoglycemic drugs: Secondary | ICD-10-CM

## 2021-12-19 LAB — COMPREHENSIVE METABOLIC PANEL, NON-FASTING
ALBUMIN/GLOBULIN RATIO: 1.4 (ref 0.8–1.4)
ALBUMIN: 3.6 g/dL (ref 3.5–5.7)
ALKALINE PHOSPHATASE: 80 U/L (ref 34–104)
ALT (SGPT): 8 U/L (ref 7–52)
ANION GAP: 7 mmol/L — ABNORMAL LOW (ref 10–20)
AST (SGOT): 13 U/L (ref 13–39)
BILIRUBIN TOTAL: 0.9 mg/dL (ref 0.3–1.2)
BUN/CREA RATIO: 23 — ABNORMAL HIGH (ref 6–22)
BUN: 16 mg/dL (ref 7–25)
CALCIUM, CORRECTED: 9.5 mg/dL (ref 8.9–10.8)
CALCIUM: 9.1 mg/dL (ref 8.6–10.3)
CHLORIDE: 90 mmol/L — ABNORMAL LOW (ref 98–107)
CO2 TOTAL: 30 mmol/L (ref 21–31)
CREATININE: 0.69 mg/dL (ref 0.60–1.30)
ESTIMATED GFR: 87 mL/min/{1.73_m2} (ref >59)
GLOBULIN: 2.6 — ABNORMAL LOW (ref 2.9–5.4)
GLUCOSE: 307 mg/dL — ABNORMAL HIGH (ref 74–109)
OSMOLALITY, CALCULATED: 268 mOsm/kg — ABNORMAL LOW (ref 270–290)
POTASSIUM: 4.8 mmol/L (ref 3.5–5.1)
PROTEIN TOTAL: 6.2 g/dL — ABNORMAL LOW (ref 6.4–8.9)
SODIUM: 127 mmol/L — ABNORMAL LOW (ref 136–145)

## 2021-12-19 LAB — CBC
HCT: 36.5 % — ABNORMAL LOW (ref 37.0–47.0)
HGB: 12.4 g/dL — ABNORMAL LOW (ref 12.5–16.0)
MCH: 30.6 pg (ref 27.0–32.0)
MCHC: 34.1 g/dL (ref 32.0–36.0)
MCV: 89.9 fL (ref 78.0–99.0)
MPV: 6.7 fL — ABNORMAL LOW (ref 7.4–10.4)
PLATELETS: 260 10*3/uL (ref 140–440)
RBC: 4.06 10*6/uL — ABNORMAL LOW (ref 4.20–5.40)
RDW: 13.8 % (ref 11.6–14.8)
WBC: 5.6 10*3/uL (ref 4.0–10.5)
WBCS UNCORRECTED: 5.6 10*3/uL

## 2021-12-19 LAB — POC BLOOD GLUCOSE (RESULTS)
GLUCOSE, POC: 401 mg/dl (ref 50–500)
GLUCOSE, POC: 481 mg/dl (ref 50–500)
GLUCOSE, POC: 431 mg/dl (ref 50–500)

## 2021-12-19 LAB — MAGNESIUM: MAGNESIUM: 1.9 mg/dL (ref 1.9–2.7)

## 2021-12-19 MED ORDER — METFORMIN 500 MG TABLET
1000.0000 mg | ORAL_TABLET | Freq: Two times a day (BID) | ORAL | Status: DC
Start: 2021-12-19 — End: 2021-12-20
  Administered 2021-12-19 – 2021-12-20 (×3): 1000 mg via ORAL
  Filled 2021-12-19 (×3): qty 2

## 2021-12-19 NOTE — Care Management Notes (Signed)
[place] Management Note    Patient Name: Jordan Buckley  Date of Birth: [birth year]  Sex: female  Date/Time of Admission: 12/16/2021  1:28 PM  Room/Bed: 329/A  Payor: MEDICARE / Plan: MEDICARE PART A AND B / Product Type: Medicare /    LOS: 3 days   Primary Care Providers:  Lerry Paterson, PA-C, PA-C (General)    Admitting Diagnosis:  Chest pain [R07.9]    Assessment:       Discharge Plan:  SNF Placement (Medicare certified) (code 3)  CM SENT [place] CM LEFT VM FOR 1ST CHOICE [facility].    The patient will continue to be evaluated for developing discharge needs.     Case Manager: Lindell Noe, [place]  Phone: [phone]

## 2021-12-19 NOTE — PT Treatment (Signed)
[facility]  [place], [address]  [phone]  [phone]  Rehabilitation Department  Physical Therapy Daily Inpatient Note    Date: 12/19/2021  Patient's Name: Jordan Buckley  Date of Birth: 1941/06/29  Height: Height: 157.5 cm ('5\' 2"'$ )  Weight: Weight: 63.5 kg (140 lb)      Plan: Will continue under current POC.         Subjective/Objective/Assessment:  Flowsheet    12/19/21 1505   Rehab Session   Document Type therapy progress note (daily note)   Total PT Minutes: 24   Patient Effort poor   General Information   Patient Profile Reviewed yes   Medical Lines Telemetry   Respiratory Status nasal cannula  (2 L nc)   Pre Treatment Status   Pre Treatment Patient Status Nurse approved session;Patient supine in bed   Cognitive Assessment/Interventions   Behavior/Mood Observations confused;anxious   Orientation Status person   Attention mild impairment   Follows Commands follows one step commands   Vital Signs   Pre-Treatment Heart Rate (beats/min) 74   Post-treatment Heart Rate (beats/min) 89   Pre SpO2 (%) 93   O2 Delivery Pre Treatment supplemental O2   Post SpO2 (%) 97   O2 Delivery Post Treatment supplemental O2   Bed Mobility Assessment/Treatment   Bed Mobility, Assistive Device draw sheet   Supine-Sit [place] moderate assist (50% patient effort)   Sit to Supine, Independence moderate assist (50% patient effort)   Transfer Assessment/Treatment   Sit-Stand [place] moderate assist (50% patient effort)   Stand-Sit [place] moderate assist (50% patient effort)   Sit-Stand-Sit, Assist Device walker, front wheeled   Gait Assessment/Treatment   Total Distance Ambulated 20   Independence  moderate assist (50% patient effort)   Assistive Device  walker, front wheeled   Distance in Feet 10 ft x 2   Balance Skill Training   Sitting Balance: Static fair balance   Sitting, Dynamic (Balance) fair balance   Therapeutic Exercise/Activity   Comment supine pt  performed B LE therx x 10 reps x 2 sets with mod assist exs to include SLR HS SAQ and AP   Post Treatment Status   Post Treatment Patient Status Patient supine in bed;Call light within reach;Telephone within reach;Sitter select activated   Physical Therapy Clinical Impression   Assessment pt very confused difficult getting to follow commands at times making slow progress toward goals will need rehab following acute care stay not safe to be alone if not rehab pt goes home will need direct 24/7 supervision                 Intervention minutes: THERAPEUTIC EXERCISE 12 minutes and GAIT TRAINING [place], PTA  12/19/2021, 15:46

## 2021-12-19 NOTE — Care Plan (Signed)
Problem: Adult Inpatient Plan of Care  Goal: Plan of Care Review  12/19/2021 0235 by Sheron Nightingale, RN  Outcome: Ongoing (see interventions/notes)  12/19/2021 0231 by Sheron Nightingale, RN  Outcome: Ongoing (see interventions/notes)  Goal: Patient-Specific Goal (Individualized)  12/19/2021 0235 by Sheron Nightingale, RN  Outcome: Ongoing (see interventions/notes)  12/19/2021 0231 by Sheron Nightingale, RN  Outcome: Ongoing (see interventions/notes)  Goal: Absence of Hospital-Acquired Illness or Injury  12/19/2021 0235 by Sheron Nightingale, RN  Outcome: Ongoing (see interventions/notes)  12/19/2021 0231 by Sheron Nightingale, RN  Outcome: Ongoing (see interventions/notes)  Goal: Optimal Comfort and Wellbeing  12/19/2021 0235 by Sheron Nightingale, RN  Outcome: Ongoing (see interventions/notes)  12/19/2021 0231 by Sheron Nightingale, RN  Outcome: Ongoing (see interventions/notes)  Goal: Rounds/Family Conference  12/19/2021 0235 by Sheron Nightingale, RN  Outcome: Ongoing (see interventions/notes)  12/19/2021 0231 by Sheron Nightingale, RN  Outcome: Ongoing (see interventions/notes)     Problem: Health Knowledge, Opportunity to Enhance (Adult,Obstetrics,Pediatric)  Goal: Knowledgeable about Health Subject/Topic  12/19/2021 0235 by Sheron Nightingale, RN  Outcome: Ongoing (see interventions/notes)  12/19/2021 0231 by Sheron Nightingale, RN  Outcome: Ongoing (see interventions/notes)     Problem: Fall Injury Risk  Goal: Absence of Fall and Fall-Related Injury  12/19/2021 0235 by Sheron Nightingale, RN  Outcome: Ongoing (see interventions/notes)  12/19/2021 0231 by Sheron Nightingale, RN  Outcome: Ongoing (see interventions/notes)     Problem: Skin Injury Risk Increased  Goal: Skin Health and Integrity  12/19/2021 0235 by Sheron Nightingale, RN  Outcome: Ongoing (see interventions/notes)  12/19/2021 0231 by Sheron Nightingale, RN  Outcome: Ongoing (see interventions/notes)       Pt receiving prn pain medication for complaints of back/hip pain. Pt has history of dementia,  confused at times. Pt has purewick catheter in place. Pt is diabetic, accuchecks ordered. Fall and skin precautions in place. Discharge planning ongoing.

## 2021-12-19 NOTE — Progress Notes (Signed)
[facility]  IP PROGRESS NOTE      Jordan Buckley, Jordan Buckley  Date of Admission:  12/16/2021  Date of Birth:  November 01, 1940  Date of Service:  12/19/2021    Hospital Day:  LOS: 3 days       Subjective:   Pt seen and examined. Appears to be asleep and resting comfortably on room air this am. Pt is easily awakened and immediately endorses low back pain. Pt appears more confused today compared to yesterday, but has a hx of dementia. Appears that she ate around 50% of breakfast. No major overnight events reported by nursing staff.   Vital Signs:  Temp (24hrs) Max:36.7 C (98.1 F)      Temperature: 36.7 C (98.1 F)  BP (Non-Invasive): (!) 146/84  MAP (Non-Invasive): 103 mmHG  Heart Rate: 97  Respiratory Rate: 20  SpO2: 96 %    Current Medications:  acetaminophen (TYLENOL) tablet, 650 mg, Oral, Q4H PRN  apixaban (ELIQUIS) tablet, 5 mg, Oral, 2x/day  atorvastatin (LIPITOR) tablet, 20 mg, Oral, QPM  dextrose 50% (0.5 g/mL) injection - syringe, 25 g, Intravenous, Q15 Min PRN   Or  dextrose 50% (0.5 g/mL) injection - syringe, 12.5 g, Intravenous, Q15 Min PRN   Or  glucagon (GLUCAGEN DIAGNOSTIC KIT) injection 1 mg, 1 mg, Subcutaneous, Q15 Min PRN   Or  glucagon (GLUCAGEN DIAGNOSTIC KIT) injection 1 mg, 1 mg, IntraMUSCULAR, Q15 Min PRN  diphenhydrAMINE (BENADRYL) capsule, 25 mg, Oral, HS PRN  docusate sodium (COLACE) capsule, 100 mg, Oral, 2x/day  ferrous sulfate 324 mg (65 mg elemental IRON) tablet, 324 mg, Oral, Daily before Breakfast  HYDROcodone-acetaminophen (NORCO) 5-325 mg per tablet, 1 Tablet, Oral, Q4H PRN  ipratropium-albuterol 0.5 mg-3 mg(2.5 mg base)/3 mL Solution for Nebulization, 3 mL, Nebulization, Q4H PRN  levothyroxine (SYNTHROID) tablet, 75 mcg, Oral, QAM  magnesium oxide (MAG-OX) 462m (2435melemental magnesium) tablet, 400 mg, Oral, 2x/day  ondansetron (ZOFRAN ODT) rapid dissolve tablet, 4 mg, Oral, Q8H PRN  pramipexole (MIRAPEX) tablet, 0.125 mg, Oral, 3x/day  SSIP insulin R human (HUMULIN R) 100  units/mL injection, 0-12 Units, Subcutaneous, 4x/day PRN        Current Orders:  Active Orders   Lab    CBC     Frequency: ONE TIME     Number of Occurrences: 1 Occurrences    CBC     Frequency: ONE TIME     Number of Occurrences: 1 Occurrences    CBC     Frequency: ONE TIME     Number of Occurrences: 1 Occurrences    COMPREHENSIVE METABOLIC PANEL, NON-FASTING     Frequency: ONE TIME     Number of Occurrences: 1 Occurrences    COMPREHENSIVE METABOLIC PANEL, NON-FASTING     Frequency: ONE TIME     Number of Occurrences: 1 Occurrences    COMPREHENSIVE METABOLIC PANEL, NON-FASTING     Frequency: ONE TIME     Number of Occurrences: 1 Occurrences    MAGNESIUM     Frequency: ONE TIME     Number of Occurrences: 1 Occurrences    MAGNESIUM     Frequency: ONE TIME     Number of Occurrences: 1 Occurrences    MAGNESIUM     Frequency: ONE TIME     Number of Occurrences: 1 Occurrences    URINALYSIS, MACROSCOPIC     Frequency: Once     Number of Occurrences: 1 Occurrences    URINALYSIS, MACROSCOPIC AND MICROSCOPIC W/CULTURE REFLEX [PRN ONLY]  Frequency: ONE TIME     Number of Occurrences: 1 Occurrences    URINALYSIS, MICROSCOPIC     Frequency: Once     Number of Occurrences: 1 Occurrences   Diet    DIET CARDIAC (2G NA, LOWFAT, LOW CHOL) Do you want to initiate MNT Protocol? Yes; Calorie amount: CC 2000     Frequency: All Meals     Number of Occurrences: 1 Occurrences   Nursing    ACTIVITY Activity: OOB WITH ASSISTANCE     Frequency: UNTIL DISCONTINUED     Number of Occurrences: Until Specified    ACTIVITY Activity: UP IN CHAIR     Frequency: UNTIL DISCONTINUED     Number of Occurrences: Until Specified    APIXABAN NURSING ORDER     Frequency: UNTIL DISCONTINUED     Number of Occurrences: Until Specified     Order Comments: Nursing Instructions:    1.  Print apixaban (Eliquis) guide using the link in this order.   2.  Nurse to provide apixaban patient guide to all patients and be sure that all new starts have received education  by the trained anticoagulation educator.  If additional questions, contact pharmacy.           APPLY SEQUENTIAL COMPRESSION DEVICE     Frequency: ONE TIME     Number of Occurrences: 1 Occurrences    CONTINUOUS CARDIAC MONITORING (ED USE ONLY)     Frequency: ONE TIME     Number of Occurrences: 1 Occurrences    EXTERNAL CATHETER     Frequency: PRN     Number of Occurrences: Until Specified    INTAKE AND OUTPUT QSHIFT     Frequency: QSHIFT     Number of Occurrences: Until Specified    MAINTAIN SEQUENTIAL COMPRESSION DEVICE     Frequency: CONTINUOUS X 72 HRS     Number of Occurrences: 55 Hours    NOTIFY MD     Frequency: PRN     Number of Occurrences: Until Specified    Notify MD Vital Signs     Frequency: PRN     Number of Occurrences: Until Specified    PT IS MEDIUM RISK FOR VENOUS THROMBOEMBOLISM     Frequency: CONTINUOUS     Number of Occurrences: Until Specified    PULSE OXIMETRY Q4H     Frequency: Q4H     Number of Occurrences: Until Specified    TELEMETRY MONITORING - Continuous     Frequency: CONTINUOUS     Number of Occurrences: Until Specified    VITAL SIGNS Q4H     Frequency: Q4H     Number of Occurrences: Until Specified    WAS PATIENT ON APIXABAN PRIOR TO ADMISSION?     Frequency: UNTIL DISCONTINUED     Number of Occurrences: Until Specified   Code Status    FULL CODE     Frequency: CONTINUOUS     Number of Occurrences: Until Specified   Consult    IP CONSULT TO CARE MANAGEMENT     Frequency: ONE TIME     Number of Occurrences: 1 Occurrences   PT    PT EVALUATE AND TREAT     Frequency: Per Therapist Discretion     Number of Occurrences: 1 Occurrences     Order Comments: If patient's O2 level drops, PT may increase O2 until sats are > 93%.       Scheduling Instructions:  Point of Care Testing    PERFORM POC WHOLE BLOOD GLUCOSE     Frequency: TID AC & HS     Number of Occurrences: Until Specified   Medications    acetaminophen (TYLENOL) tablet     Frequency: Q4H PRN     Dose: 650 mg     Route:  Oral    apixaban (ELIQUIS) tablet     Frequency: 2x/day     Dose: 5 mg     Route: Oral    atorvastatin (LIPITOR) tablet     Frequency: QPM     Dose: 20 mg     Route: Oral    dextrose 50% (0.5 g/mL) injection - syringe     Linked Order: Or     Frequency: Q15 Min PRN     Dose: 25 g     Route: Intravenous    dextrose 50% (0.5 g/mL) injection - syringe     Linked Order: Or     Frequency: Q15 Min PRN     Dose: 12.5 g     Route: Intravenous    diphenhydrAMINE (BENADRYL) capsule     Frequency: HS PRN     Dose: 25 mg     Route: Oral    docusate sodium (COLACE) capsule     Frequency: 2x/day     Dose: 100 mg     Route: Oral    ferrous sulfate 324 mg (65 mg elemental IRON) tablet     Frequency: Daily before Breakfast     Dose: 324 mg     Route: Oral    glucagon (GLUCAGEN DIAGNOSTIC KIT) injection 1 mg     Linked Order: Or     Frequency: Q15 Min PRN     Dose: 1 mg     Route: Subcutaneous    glucagon (GLUCAGEN DIAGNOSTIC KIT) injection 1 mg     Linked Order: Or     Frequency: Q15 Min PRN     Dose: 1 mg     Route: IntraMUSCULAR    HYDROcodone-acetaminophen (NORCO) 5-325 mg per tablet     Frequency: Q4H PRN     Dose: 1 Tablet     Route: Oral    ipratropium-albuterol 0.5 mg-3 mg(2.5 mg base)/3 mL Solution for Nebulization     Frequency: Q4H PRN     Dose: 3 mL     Route: Nebulization    levothyroxine (SYNTHROID) tablet     Frequency: QAM     Dose: 75 mcg     Route: Oral    magnesium oxide (MAG-OX) 461m (2419melemental magnesium) tablet     Frequency: 2x/day     Dose: 400 mg     Route: Oral    ondansetron (ZOFRAN ODT) rapid dissolve tablet     Frequency: Q8H PRN     Dose: 4 mg     Route: Oral    pramipexole (MIRAPEX) tablet     Frequency: 3x/day     Dose: 0.125 mg     Route: Oral    SSIP insulin R human (HUMULIN R) 100 units/mL injection     Frequency: 4x/day PRN     Dose: 0-12 Units     Route: Subcutaneous        Review of Systems:  Not available today due to confusion    Today's Physical Exam:  Physical Exam  Constitutional:        General: She is not in acute distress.  Cardiovascular:      Rate  and Rhythm: Normal rate and regular rhythm.   Pulmonary:      Effort: Pulmonary effort is normal. No respiratory distress.      Breath sounds: Normal breath sounds. No wheezing or rales.   Abdominal:      General: Abdomen is flat. Bowel sounds are normal. There is no distension.      Palpations: Abdomen is soft.      Tenderness: There is no abdominal tenderness.   Musculoskeletal:         General: No swelling.   Skin:     General: Skin is warm and dry.   Neurological:      General: No focal deficit present.      Mental Status: She is alert. She is disoriented.              I/O:  I/O last 24 hours:      Intake/Output Summary (Last 24 hours) at 12/19/2021 1221  Last data filed at 12/19/2021 0526  Gross per 24 hour   Intake 360 ml   Output --   Net 360 ml     I/O current shift:  No intake/output data recorded.      Labs  Please indicate ordered or reviewed)  Reviewed: I have reviewed all lab results.  Lab Results Today:    Results for orders placed or performed during the hospital encounter of 12/16/21 (from the past 24 hour(s))   POC BLOOD GLUCOSE (RESULTS)   Result Value Ref Range    GLUCOSE, POC 398 50 - 500 mg/dl   COMPREHENSIVE METABOLIC PANEL, NON-FASTING   Result Value Ref Range    SODIUM 127 (L) 136 - 145 mmol/L    POTASSIUM 4.8 3.5 - 5.1 mmol/L    CHLORIDE 90 (L) 98 - 107 mmol/L    CO2 TOTAL 30 21 - 31 mmol/L    ANION GAP 7 (L) 10 - 20 mmol/L    BUN 16 7 - 25 mg/dL    CREATININE 0.69 0.60 - 1.30 mg/dL    BUN/CREA RATIO 23 (H) 6 - 22    ESTIMATED GFR 87 >59 mL/min/1.88m2    ALBUMIN 3.6 3.5 - 5.7 g/dL    CALCIUM 9.1 8.6 - 10.3 mg/dL    GLUCOSE 307 (H) 74 - 109 mg/dL    ALKALINE PHOSPHATASE 80 34 - 104 U/L    ALT (SGPT) 8 7 - 52 U/L    AST (SGOT) 13 13 - 39 U/L    BILIRUBIN TOTAL 0.9 0.3 - 1.2 mg/dL    PROTEIN TOTAL 6.2 (L) 6.4 - 8.9 g/dL    ALBUMIN/GLOBULIN RATIO 1.4 0.8 - 1.4    OSMOLALITY, CALCULATED 268 (L) 270 - 290 mOsm/kg    CALCIUM,  CORRECTED 9.5 8.9 - 10.8 mg/dL    GLOBULIN 2.6 (L) 2.9 - 5.4   MAGNESIUM   Result Value Ref Range    MAGNESIUM 1.9 1.9 - 2.7 mg/dL   CBC   Result Value Ref Range    WBCS UNCORRECTED 5.6 x10^3/uL    WBC 5.6 4.0 - 10.5 x10^3/uL    RBC 4.06 (L) 4.20 - 5.40 x10^6/uL    HGB 12.4 (L) 12.5 - 16.0 g/dL    HCT 36.5 (L) 37.0 - 47.0 %    MCV 89.9 78.0 - 99.0 fL    MCH 30.6 27.0 - 32.0 pg    MCHC 34.1 32.0 - 36.0 g/dL    RDW 13.8 11.6 - 14.8 %    PLATELETS 260 140 - 440  x10^3/uL    MPV 6.7 (L) 7.4 - 10.4 fL           Problem List:  Active Hospital Problems   (*Primary Problem)    Diagnosis   . *Chest pain   . Dementia (CMS [place])   . Anticoagulant long-term use   . Back arthralgia   . Weakness   . Osteoporosis       Assessment/ Plan:   1) back pain-new L1 compression fx on mri. Spoke with Dr. Harolyn Rutherford who thinks she may benefit from kyphoplasty as she had good results from procedure done on T12 recently. Will see if this can be done as inpatient as her pain appears to be a barrier to full participation in physical therapy. Otherwise, will need to arrange as outpt.    2)cp-appears resolved. Echo reviewed. Will defer further workup at this time unless new problems arise.    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 Continue metformin and conservative ssi coverage.    4)dementia-chronic    Pt pas was submitted yesterday and snf placement is pending. Further recommendations depending on clinical course. The Hospitalist personally evaluated and examined the patient in conjunction with the MLP and agree with the assessments, treatment plan and disposition of the patient as recorded by the [facility].    Lovenia Kim, PA-C    DVT/PE Prophylaxis: Apixaban      Plan of care discussed with the daughter at bedside.  Further management discussed as documented above.    [place], [place] HOSPITALIST

## 2021-12-19 NOTE — Care Management Notes (Signed)
[facility] Management Note    Patient Name: Jordan Buckley  Date of Birth: 18-Dec-1940  Sex: female  Date/Time of Admission: 12/16/2021  1:28 PM  Room/Bed: 329/A  Payor: MEDICARE / Plan: MEDICARE PART A AND B / Product Type: Medicare /    LOS: 3 days   Primary Care Providers:  Lerry Paterson, PA-C, PA-C (General)    Admitting Diagnosis:  Chest pain [R07.9]    Assessment:       Discharge Plan:  SNF Placement (Medicare certified) (code 3)  NISSA, STATES [facility] CAN'T TAKE THE PT UNLESS HER MEDICAID IS ALREADY APPROVED. CM EXPLAINED THAT THE FAMILY IS WORKING ON THE APPLICATION AND THE OLDER DAUGHTER IS BRINGING PAPERWORK IN THIS WEEKEND TO WORK ON THE MEDICAID APPLICATION. NISSA STATES THAT IF THE PT NEEDS PLACED BEFORE HER MEDICAID IS APPROVED THEN CM SHOULD LOOK FOR A DIFFERENT FACILITY. TOM, [employer], STATES THAT He CAN OFFER THE PATIENT A BED TOMORROW AT [facility] BUT THAT THE FAMILY NEEDS TO GET THE MEDICAID COMPLETED ASAP. CM SPOKE WITH CHRISTY REGARDING MEDICAID APPLICATION. CHRISTY SAID IF THE FAMILY BRINGS HER THE LONG APPLICATION AND 3 MONTHS OF BANK STATEMENTS THAT She WILL MAKE HER CASE A PRIORITY TOMORROW. CM SPOKE WITH THE DAUGHTER WHO ASKED IF PT CAN TRANSFER TO [facility] LATER, CM STATES THAT She CAN CERTAINLY ASK TO DO THAT IF THE PT IS STILL WANTING TO MOVE BY THEN. CM STRESSED THAT THE MEDICAID APP NEEDS COMPLETED ASAP AND ASKED IF She CAN BRING THE APPLICATION AND 3 MONTHS OF BANK STATEMENTS TO [place] IN THE MORNING AND GIVE THE INFO TO [place]. DAUGHTER STATES THAT SHE'LL WORK ON THE APPLICATION THIS EVENING AND PLAN TO DO THAT TOMORROW. CM NOTIFIED TOM THAT [facility] TOMORROW IS GOOD AND PT IS WORKING ON MEDICAID APPLICATION AND HOPES TO BRING CHRISTY INFO TOMORROW. CM NOTIFIED HOSPITALIST AND CHARGE NURSE OF PLACEMENT TOMORROW AT [place].     The patient will continue to be evaluated for developing discharge needs.     Case Manager: Lindell Noe, [place]  Phone:  [phone]

## 2021-12-20 DIAGNOSIS — M6281 Muscle weakness (generalized): Secondary | ICD-10-CM | POA: Insufficient documentation

## 2021-12-20 DIAGNOSIS — F0390 Unspecified dementia without behavioral disturbance: Secondary | ICD-10-CM | POA: Insufficient documentation

## 2021-12-20 DIAGNOSIS — E119 Type 2 diabetes mellitus without complications: Secondary | ICD-10-CM | POA: Diagnosis present

## 2021-12-20 DIAGNOSIS — R1311 Dysphagia, oral phase: Secondary | ICD-10-CM | POA: Insufficient documentation

## 2021-12-20 DIAGNOSIS — E039 Hypothyroidism, unspecified: Secondary | ICD-10-CM | POA: Insufficient documentation

## 2021-12-20 DIAGNOSIS — S32010A Wedge compression fracture of first lumbar vertebra, initial encounter for closed fracture: Secondary | ICD-10-CM | POA: Diagnosis present

## 2021-12-20 DIAGNOSIS — M810 Age-related osteoporosis without current pathological fracture: Secondary | ICD-10-CM | POA: Insufficient documentation

## 2021-12-20 DIAGNOSIS — E11649 Type 2 diabetes mellitus with hypoglycemia without coma: Secondary | ICD-10-CM

## 2021-12-20 DIAGNOSIS — S32010D Wedge compression fracture of first lumbar vertebra, subsequent encounter for fracture with routine healing: Secondary | ICD-10-CM | POA: Insufficient documentation

## 2021-12-20 DIAGNOSIS — I2510 Atherosclerotic heart disease of native coronary artery without angina pectoris: Secondary | ICD-10-CM | POA: Insufficient documentation

## 2021-12-20 DIAGNOSIS — G309 Alzheimer's disease, unspecified: Secondary | ICD-10-CM | POA: Insufficient documentation

## 2021-12-20 DIAGNOSIS — M5451 Vertebrogenic low back pain: Secondary | ICD-10-CM | POA: Insufficient documentation

## 2021-12-20 DIAGNOSIS — E785 Hyperlipidemia, unspecified: Secondary | ICD-10-CM | POA: Insufficient documentation

## 2021-12-20 LAB — COMPREHENSIVE METABOLIC PANEL, NON-FASTING
ALBUMIN/GLOBULIN RATIO: 1.6 — ABNORMAL HIGH (ref 0.8–1.4)
ALBUMIN: 3.7 g/dL (ref 3.5–5.7)
ALKALINE PHOSPHATASE: 88 U/L (ref 34–104)
ALT (SGPT): 8 U/L (ref 7–52)
ANION GAP: 8 mmol/L — ABNORMAL LOW (ref 10–20)
AST (SGOT): 12 U/L — ABNORMAL LOW (ref 13–39)
BILIRUBIN TOTAL: 0.9 mg/dL (ref 0.3–1.2)
BUN/CREA RATIO: 26 — ABNORMAL HIGH (ref 6–22)
BUN: 19 mg/dL (ref 7–25)
CALCIUM, CORRECTED: 9.4 mg/dL (ref 8.9–10.8)
CALCIUM: 9.1 mg/dL (ref 8.6–10.3)
CHLORIDE: 91 mmol/L — ABNORMAL LOW (ref 98–107)
CO2 TOTAL: 31 mmol/L (ref 21–31)
CREATININE: 0.73 mg/dL (ref 0.60–1.30)
ESTIMATED GFR: 83 mL/min/{1.73_m2} (ref >59)
GLOBULIN: 2.3 — ABNORMAL LOW (ref 2.9–5.4)
GLUCOSE: 340 mg/dL — ABNORMAL HIGH (ref 74–109)
OSMOLALITY, CALCULATED: 277 mOsm/kg (ref 270–290)
POTASSIUM: 4.7 mmol/L (ref 3.5–5.1)
PROTEIN TOTAL: 6 g/dL — ABNORMAL LOW (ref 6.4–8.9)
SODIUM: 130 mmol/L — ABNORMAL LOW (ref 136–145)

## 2021-12-20 LAB — CBC
HCT: 36 % — ABNORMAL LOW (ref 37.0–47.0)
HGB: 12.3 g/dL — ABNORMAL LOW (ref 12.5–16.0)
MCH: 30.6 pg (ref 27.0–32.0)
MCHC: 34.3 g/dL (ref 32.0–36.0)
MCV: 89.3 fL (ref 78.0–99.0)
MPV: 6.8 fL — ABNORMAL LOW (ref 7.4–10.4)
PLATELETS: 246 10*3/uL (ref 140–440)
RBC: 4.03 10*6/uL — ABNORMAL LOW (ref 4.20–5.40)
RDW: 14 % (ref 11.6–14.8)
WBC: 4.9 10*3/uL (ref 4.0–10.5)
WBCS UNCORRECTED: 4.9 10*3/uL

## 2021-12-20 LAB — POC BLOOD GLUCOSE (RESULTS)
GLUCOSE, POC: 230 mg/dl (ref 50–500)
GLUCOSE, POC: 77 mg/dl (ref 50–500)
GLUCOSE, POC: 223 mg/dl (ref 50–500)

## 2021-12-20 LAB — MAGNESIUM: MAGNESIUM: 1.7 mg/dL — ABNORMAL LOW (ref 1.9–2.7)

## 2021-12-20 MED ORDER — DOCUSATE SODIUM 100 MG CAPSULE
100.0000 mg | ORAL_CAPSULE | Freq: Every day | ORAL | 0 refills | Status: AC
Start: 2021-12-20 — End: ?

## 2021-12-20 MED ORDER — HYDROCODONE 5 MG-ACETAMINOPHEN 325 MG TABLET
1.0000 | ORAL_TABLET | ORAL | 0 refills | Status: DC | PRN
Start: 2021-12-20 — End: 2022-01-08

## 2021-12-20 MED ORDER — INSULIN REGULAR HUMAN 100 UNIT/ML INJECTION SSIP
0.0000 [IU] | INJECTION | Freq: Four times a day (QID) | SUBCUTANEOUS | 0 refills | Status: DC | PRN
Start: 2021-12-20 — End: 2022-01-08

## 2021-12-20 NOTE — Nurses Notes (Signed)
Patient is being discharged to [facility] in [place]. She is being transported via squad. Patient and family all aware of the transport expresses understanding.

## 2021-12-20 NOTE — PT Treatment (Signed)
[facility]  [place], [address]  [phone]  [phone]  Rehabilitation Department  Physical Therapy Daily Inpatient Note    Date: 12/20/2021  Patient's Name: Jordan Buckley  Date of Birth: January 12, 1941  Height: Height: 157.5 cm ('5\' 2"'$ )  Weight: Weight: 63.5 kg (140 lb)      Plan: Will continue under current POC.         Subjective/Objective/Assessment:     12/20/21 0938   Rehab Session   Document Type therapy progress note (daily note)   Total PT Minutes: 13   Patient Effort poor   Symptoms Noted During/After Treatment increased pain   Symptoms Noted Comment low back into LLE   General Information   Patient Profile Reviewed yes   Respiratory Status nasal cannula   Pre Treatment Status   Pre Treatment Patient Status Nurse approved session;Patient supine in bed   Cognitive Assessment/Interventions   Behavior/Mood Observations anxious;restless   Orientation Status oriented to;person;place   Vital Signs   Pre SpO2 (%) 94   O2 Delivery Pre Treatment supplemental O2   Post SpO2 (%) 96   Pain Assessment   Pain Intervention  PRN Medication   Pre/Posttreatment Pain Comment 10/10   Bed Mobility Assessment/Treatment   Bed Mobility, Assistive Device draw sheet   Supine-Sit [place] minimum assist (75% patient effort)   Sit to Supine, [place] contact guard assist   Roll Left [place] independent   Roll Right Independence independent   Scoot/Bridge Independence independent   Transfer Assessment/Treatment   Sit-Stand [place] moderate assist (50% patient effort);2 person assist required   Stand-Sit [place] moderate assist (50% patient effort)   Sit-Stand-Sit, Assist Device walker, front wheeled   Post Treatment Status   Post Treatment Patient Status Patient supine in bed   Physical Therapy Clinical Impression   Assessment Pt expressed much pain when trying to stand and stated she "just couldn't do it." Encouragement provided but pt declined  trying. Offered sitting in recliner chair and pt refused. She was left with needs in reach and chair alarmed                 Intervention minutes: THERAPEUTIC ACTIVITY 13 minutes    THERAPIST  Brena Windsor, PTA  12/20/2021, 16:28

## 2021-12-20 NOTE — Care Management Notes (Signed)
[facility] Management Note    Patient Name: Jordan Buckley  Date of Birth: 1940/09/28  Sex: female  Date/Time of Admission: 12/16/2021  1:28 PM  Room/Bed: 329/A  Payor: MEDICARE / Plan: MEDICARE PART A AND B / Product Type: Medicare /    LOS: 4 days   Primary Care Providers:  Lerry Paterson, PA-C, PA-C (General)    Admitting Diagnosis:  Chest pain [R07.9]    Assessment:       Discharge Plan:  SNF Placement (Medicare certified) (code 3)  TOM, [employer], STATES [facility] BED [place]. CM TOLD TOM THAT DR WANTS PT'S BLOOD SUGAR RESULTS 2 HOURS AFTER LUNCH BEFORE PT CAN D/C. CM UPDATED TOM REGARDING SOME BEHAVIORS FROM PT, TOM CHECKED AND SAID [facility] IS STILL ABLE TO ACCEPT THE PT. CM SPOKE WITH THE PT'S DAUGHTER AMANDA WHO SAID She PROVIDED MEDICAID  PAPERWORK TO CHRISTY, [employer], THIS MORNING AND NEEDS TO GIVE HER A COUPLE MORE ITEMS. LATER, CM LET TOM KNOW THAT D/C SUMMARY IS AVAILABLE.     The patient will continue to be evaluated for developing discharge needs.     Case Manager: Lindell Noe, [place]  Phone: [phone]

## 2021-12-20 NOTE — Discharge Summary (Addendum)
[facility]  DISCHARGE SUMMARY    PATIENT NAME:  Jordan Buckley, Jordan Buckley  MRN:  [id number]  DOB:  January 26, 1941    ENCOUNTER DATE:  12/16/2021  INPATIENT ADMISSION DATE: 12/16/2021  DISCHARGE DATE:  12/20/2021    ATTENDING PHYSICIAN: Norman Herrlich, MD  SERVICE: [facility] HOSPITALIST 3  PRIMARY CARE PHYSICIAN: Lerry Paterson, PA-C       No lay caregiver identified.      PRIMARY DISCHARGE DIAGNOSIS: Chest pain  Active Hospital Problems    Diagnosis Date Noted   . Principal Problem: Chest pain [R07.9] 12/16/2021   . Closed compression fracture of L1 vertebra (CMS [place]) [S32.010A] 12/20/2021   . Diabetes mellitus, type 2 (CMS [place]) [E11.9] 12/20/2021   . Dementia (CMS [place]) [F03.90] 12/17/2021   . Anticoagulant long-term use [Z79.01] 12/17/2021   . Back arthralgia [M54.9] 12/16/2021   . Weakness [R53.1] 12/16/2021   . Osteoporosis [M81.0] 04/27/2002         Hyponatremia, clinically insignificant      Resolved Hospital Problems   No resolved problems to display.     Active Non-Hospital Problems    Diagnosis Date Noted   . Osteoarthrosis 04/27/2002           Current Discharge Medication List      START taking these medications.      Details   docusate sodium 100 mg Capsule  Commonly known as: COLACE   100 mg, Oral, DAILY  Qty: 1 Capsule  Refills: 0     HYDROcodone-acetaminophen 5-325 mg Tablet  Commonly known as: NORCO   1 Tablet, Oral, EVERY 4 HOURS PRN  Qty: 10 Tablet  Refills: 0     insulin R human 100 units/mL Injectable  Commonly known as: HUMULIN R   0-12 Units, Subcutaneous, 4 TIMES DAILY PRN, CAUTION:  "High Alert" Medication.  Qty: 1 Each  Refills: 0        CONTINUE these medications - NO CHANGES were made during your visit.      Details   apixaban 2.5 mg Tablet  Commonly known as: ELIQUIS   5 mg, Oral, 2 TIMES DAILY  Refills: 0     atorvastatin 20 mg Tablet  Commonly known as: LIPITOR   20 mg, Oral, EVERY EVENING  Refills: 0     ergocalciferol (vitamin D2) 1,250 mcg (50,000 unit) Capsule  Commonly known as: DRISDOL   Vitamin  D2 1,250 mcg (50,000 unit) capsule   Take by oral route.  Refills: 0     ferrous sulfate 325 mg (65 mg iron) Tablet  Commonly known as: FEOSOL   325 mg, Oral, 2 TIMES DAILY WITH FOOD  Refills: 0     levothyroxine 75 mcg Tablet  Commonly known as: SYNTHROID   75 mcg, Oral, EVERY MORNING  Refills: 0     magnesium oxide 400 mg Tablet  Commonly known as: MAG-OX   400 mg, Oral, 2 TIMES DAILY  Refills: 0     MetFORMIN 1,000 mg Tablet  Commonly known as: GLUCOPHAGE   1,000 mg, Oral, 2 TIMES DAILY WITH FOOD  Refills: 0     ondansetron 4 mg Tablet, Rapid Dissolve  Commonly known as: ZOFRAN ODT   4 mg, Oral, EVERY 8 HOURS PRN  Qty: 12 Tablet  Refills: 0     pramipexole 0.125 mg Tablet  Commonly known as: MIRAPEX   0.125 mg, Oral, 3 TIMES DAILY  Refills: 0        STOP taking these medications.  Basaglar KwikPen U-100 Insulin 100 unit/mL Insulin Pen  Generic drug: insulin glargine     HumaLOG KwikPen Insulin 200 unit/mL (3 mL) Insulin Pen  Generic drug: insulin lispro     NovoLOG FlexPen U-100 Insulin 100 unit/mL (3 mL) Insulin Pen  Generic drug: insulin aspart U-100     traMADoL 50 mg Tablet  Commonly known as: ULTRAM          Discharge med list refreshed?  YES     Allergies   Allergen Reactions   . Cefaclor Rash   . Nitrofurantoin Rash   . Quinine Rash   . Sulfa (Sulfonamides) Rash   . Ciprofloxacin    . Dulaglutide    . Fluconazole    . Nitrofurantoin Macrocrystal    . Quinidine-Quinine Analogues ([place] Alkaloids)      HOSPITAL PROCEDURE(S):   No orders of the defined types were placed in this encounter.      [place] COURSE   This patient is an 81 year old female with a history of osteoporosis who presented to the hospital on 12/16/2021 complaining of chest pain and mid back pain associated with increasing weakness and date failure.  She had a kyphoplasty several weeks ago of T12, after which her daughter states that she did well until 3 days prior to admission when she began to have  worsening pain and difficulty moving around.  She had some complaints of chest pain on admission, however, her troponin was only minimally elevated and her chest pain resolved.  She was admitted to the hospitalist service and underwent an MRI of the lumbosacral spine which showed a new L1 compression fracture.  Interventional Radiology is willing to do a kyphoplasty on L1 since she had good results with T12, however, we were informed by case management this would have to be done on an outpatient basis.  Her pain meds were adjusted and she participated with physical therapy was able to walk about 20 ft.  She has been accepted at [facility] for physical therapy.  She has a history of dementia which her daughter states has gotten worse since her mobility has decreased, however, on this admission she has been calm and cooperative.  She does have intermittent confusion which is to be expected.  She had no recurrence of chest pain and we did an echo which showed a preserved EF.  Treatment options for chest pain were discussed with her daughter and she was agreeable to outpatient eval of chest pain if symptoms are recurrent.  Patient has diabetes and takes insulin and metformin at home, H A1c is pending, however, her glucose levels have been in the 300s at times.  We have resumed her home metformin and recommend sliding scale coverage at discharge.  Family states that she tends to be hypoglycemic with aggressive insulin so we will not resume her home insulin regimen at this time but it may be resumed in the outpatient setting if her PCP agrees.  She will be discharged to [facility] today with outpatient arrangements for kyphoplasty of L1 and follow-up with her PCP in 1-2 weeks.The Hospitalist personally evaluated and examined the patient in conjunction with the MLP and agree with the assessments, treatment plan and disposition of the patient as recorded by the [facility].    Physical exam:   Alert, confused, no acute distress  Heart  regular rate and rhythm  Lungs clear to auscultation bilaterally, normal effort  Abdomen soft and active x4, no guarding or rebound tenderness  No leg edema  Skin warm and dry  No focal deficit      LINES/DRAINS/WOUNDS AT DISCHARGE:   Patient Lines/Drains/Airways Status     Active Line / Dialysis Catheter / Dialysis Graft / Drain / Airway / Wound     None                DISCHARGE DISPOSITION:  Skilled Nursing Unit  DISCHARGE INSTRUCTIONS:  Post-Discharge Follow Up Appointments     Thursday Aug 28, 2022    Return Patient Visit with Pia Mau, APRN,NP-C at 10:30 AM    Cardiology [place], [facility], [place] [address]  [phone]        No discharge procedures on file.       Sherol Dade, PA-C    Copies sent to Care Team       Relationship Specialty Notifications Start End    Lerry Paterson, [place] PCP - General PHYSICIAN ASSISTANT  10/29/21     Phone: [phone] Fax: [phone]         [place] [id number]        ADDENDUM: pt was hypoglycemic earlier in the day prior to DC and recovered with po intake. Glucose was checked 2 hours later and was in 200's. Pt has a known hx of labile glucose levels per family. Will proceed with DC and recommended continuation of conservative ssi coverage with metformin at snf. Nursing updated on DC plan prior to DC. The Hospitalist personally evaluated and examined the patient in conjunction with the MLP and agree with the assessments, treatment plan and disposition of the patient as recorded by the [facility].  Referring providers can utilize [email or web address] to access their referred [place] patient's information.       Discharge and follow-up plan discussed as documented above.       The Hospitalist personally evaluated and examined the patient in conjunction with the MLP and agree with the assessments, treatment plan and  disposition of the patient as recorded by the [facility].    Time spent, 35 minutes    [place], [place] HOSPITALIST

## 2021-12-20 NOTE — Care Plan (Signed)
Problem: Adult Inpatient Plan of Care  Goal: Plan of Care Review  Outcome: Ongoing (see interventions/notes)  Goal: Patient-Specific Goal (Individualized)  Outcome: Ongoing (see interventions/notes)  Goal: Absence of Hospital-Acquired Illness or Injury  Outcome: Ongoing (see interventions/notes)  Goal: Optimal Comfort and Wellbeing  Outcome: Ongoing (see interventions/notes)  Goal: Rounds/Family Conference  Outcome: Ongoing (see interventions/notes)     Problem: Health Knowledge, Opportunity to Enhance (Adult,Obstetrics,Pediatric)  Goal: Knowledgeable about Health Subject/Topic  Description: Patient will demonstrate the desired outcomes by discharge/transition of care.  Outcome: Ongoing (see interventions/notes)     Problem: Fall Injury Risk  Goal: Absence of Fall and Fall-Related Injury  Outcome: Ongoing (see interventions/notes)     Problem: Skin Injury Risk Increased  Goal: Skin Health and Integrity  Outcome: Ongoing (see interventions/notes)    Patient denying CP.  BS fluctuating between 100's-400s.  Only receiving coverage if above 200 and gets 3 Units. Fall and skin precautions in place.  Plan is to D/C to [facility] tomorrow.

## 2021-12-21 DIAGNOSIS — R278 Other lack of coordination: Secondary | ICD-10-CM | POA: Insufficient documentation

## 2021-12-21 DIAGNOSIS — R293 Abnormal posture: Secondary | ICD-10-CM | POA: Insufficient documentation

## 2021-12-21 DIAGNOSIS — R2689 Other abnormalities of gait and mobility: Secondary | ICD-10-CM | POA: Insufficient documentation

## 2021-12-21 LAB — HGA1C (HEMOGLOBIN A1C WITH EST AVG GLUCOSE): HEMOGLOBIN A1C: 9.4 % — ABNORMAL HIGH (ref 4.0–6.0)

## 2021-12-23 ENCOUNTER — Other Ambulatory Visit: Payer: Self-pay

## 2021-12-23 ENCOUNTER — Inpatient Hospital Stay
Admission: EM | Admit: 2021-12-23 | Discharge: 2022-01-08 | DRG: 637 | Disposition: A | Discharge status: Skilled Nursing Facility | Payer: Medicare Other | Source: Ambulatory Visit | Attending: Internal Medicine | Admitting: Internal Medicine

## 2021-12-23 ENCOUNTER — Encounter (HOSPITAL_COMMUNITY): Payer: Self-pay | Admitting: Family

## 2021-12-23 ENCOUNTER — Emergency Department (HOSPITAL_COMMUNITY): Payer: Medicare Other

## 2021-12-23 ENCOUNTER — Other Ambulatory Visit (HOSPITAL_COMMUNITY): Payer: Medicare Other

## 2021-12-23 DIAGNOSIS — E039 Hypothyroidism, unspecified: Secondary | ICD-10-CM

## 2021-12-23 DIAGNOSIS — Z8673 Personal history of transient ischemic attack (TIA), and cerebral infarction without residual deficits: Secondary | ICD-10-CM

## 2021-12-23 DIAGNOSIS — G9341 Metabolic encephalopathy: Secondary | ICD-10-CM | POA: Diagnosis present

## 2021-12-23 DIAGNOSIS — S32019A Unspecified fracture of first lumbar vertebra, initial encounter for closed fracture: Secondary | ICD-10-CM | POA: Diagnosis present

## 2021-12-23 DIAGNOSIS — E119 Type 2 diabetes mellitus without complications: Secondary | ICD-10-CM

## 2021-12-23 DIAGNOSIS — E11649 Type 2 diabetes mellitus with hypoglycemia without coma: Secondary | ICD-10-CM | POA: Diagnosis not present

## 2021-12-23 DIAGNOSIS — E1110 Type 2 diabetes mellitus with ketoacidosis without coma: Principal | ICD-10-CM | POA: Diagnosis present

## 2021-12-23 DIAGNOSIS — Z7984 Long term (current) use of oral hypoglycemic drugs: Secondary | ICD-10-CM

## 2021-12-23 DIAGNOSIS — E871 Hypo-osmolality and hyponatremia: Secondary | ICD-10-CM

## 2021-12-23 DIAGNOSIS — Z515 Encounter for palliative care: Secondary | ICD-10-CM

## 2021-12-23 DIAGNOSIS — E875 Hyperkalemia: Secondary | ICD-10-CM | POA: Diagnosis present

## 2021-12-23 DIAGNOSIS — Z7901 Long term (current) use of anticoagulants: Secondary | ICD-10-CM

## 2021-12-23 DIAGNOSIS — I10 Essential (primary) hypertension: Secondary | ICD-10-CM | POA: Diagnosis present

## 2021-12-23 DIAGNOSIS — Z79890 Hormone replacement therapy (postmenopausal): Secondary | ICD-10-CM

## 2021-12-23 DIAGNOSIS — Z741 Need for assistance with personal care: Secondary | ICD-10-CM | POA: Insufficient documentation

## 2021-12-23 DIAGNOSIS — Z20822 Contact with and (suspected) exposure to covid-19: Secondary | ICD-10-CM | POA: Diagnosis not present

## 2021-12-23 DIAGNOSIS — Z66 Do not resuscitate: Secondary | ICD-10-CM | POA: Diagnosis not present

## 2021-12-23 DIAGNOSIS — S22089A Unspecified fracture of T11-T12 vertebra, initial encounter for closed fracture: Secondary | ICD-10-CM | POA: Diagnosis present

## 2021-12-23 DIAGNOSIS — Z79899 Other long term (current) drug therapy: Secondary | ICD-10-CM

## 2021-12-23 DIAGNOSIS — H9190 Unspecified hearing loss, unspecified ear: Secondary | ICD-10-CM

## 2021-12-23 DIAGNOSIS — Z951 Presence of aortocoronary bypass graft: Secondary | ICD-10-CM

## 2021-12-23 DIAGNOSIS — R4182 Altered mental status, unspecified: Secondary | ICD-10-CM

## 2021-12-23 DIAGNOSIS — N179 Acute kidney failure, unspecified: Secondary | ICD-10-CM | POA: Diagnosis present

## 2021-12-23 DIAGNOSIS — R29810 Facial weakness: Secondary | ICD-10-CM

## 2021-12-23 DIAGNOSIS — D649 Anemia, unspecified: Secondary | ICD-10-CM | POA: Insufficient documentation

## 2021-12-23 DIAGNOSIS — F0280 Dementia in other diseases classified elsewhere without behavioral disturbance: Secondary | ICD-10-CM | POA: Diagnosis present

## 2021-12-23 DIAGNOSIS — Z794 Long term (current) use of insulin: Secondary | ICD-10-CM

## 2021-12-23 DIAGNOSIS — I2510 Atherosclerotic heart disease of native coronary artery without angina pectoris: Secondary | ICD-10-CM

## 2021-12-23 DIAGNOSIS — G309 Alzheimer's disease, unspecified: Secondary | ICD-10-CM | POA: Diagnosis present

## 2021-12-23 DIAGNOSIS — M810 Age-related osteoporosis without current pathological fracture: Secondary | ICD-10-CM | POA: Diagnosis present

## 2021-12-23 LAB — CBC WITH DIFF
BASOPHIL #: 0 10*3/uL (ref 0.00–0.30)
BASOPHIL #: 0 10*3/uL (ref 0.00–0.30)
BASOPHIL %: 1 % (ref 0–3)
BASOPHIL %: 0 % (ref 0–3)
EOSINOPHIL #: 0.1 10*3/uL (ref 0.00–0.80)
EOSINOPHIL #: 0 10*3/uL (ref 0.00–0.80)
EOSINOPHIL %: 1 % (ref 0–7)
EOSINOPHIL %: 0 % (ref 0–7)
HCT: 37.6 % (ref 37.0–47.0)
HCT: 40.4 % (ref 37.0–47.0)
HGB: 12.6 g/dL (ref 12.5–16.0)
HGB: 12.2 g/dL — ABNORMAL LOW (ref 12.5–16.0)
LYMPHOCYTE #: 1.1 10*3/uL (ref 1.10–5.00)
LYMPHOCYTE #: 0.2 10*3/uL — ABNORMAL LOW (ref 1.10–5.00)
LYMPHOCYTE %: 12 % — ABNORMAL LOW (ref 25–45)
LYMPHOCYTE %: 2 % — ABNORMAL LOW (ref 25–45)
MCH: 30.4 pg (ref 27.0–32.0)
MCH: 30.4 pg (ref 27.0–32.0)
MCHC: 33.6 g/dL (ref 32.0–36.0)
MCHC: 30.1 g/dL — ABNORMAL LOW (ref 32.0–36.0)
MCV: 90.4 fL (ref 78.0–99.0)
MCV: 101.1 fL — ABNORMAL HIGH (ref 78.0–99.0)
MONOCYTE #: 0.6 10*3/uL (ref 0.00–1.30)
MONOCYTE #: 0.2 10*3/uL (ref 0.00–1.30)
MONOCYTE %: 6 % (ref 0–12)
MONOCYTE %: 2 % (ref 0–12)
MPV: 7.3 fL — ABNORMAL LOW (ref 7.4–10.4)
MPV: 7 fL — ABNORMAL LOW (ref 7.4–10.4)
NEUTROPHIL #: 7.4 10*3/uL (ref 1.80–8.40)
NEUTROPHIL #: 10.6 10*3/uL — ABNORMAL HIGH (ref 1.80–8.40)
NEUTROPHIL %: 80 % — ABNORMAL HIGH (ref 40–76)
NEUTROPHIL %: 96 % — ABNORMAL HIGH (ref 40–76)
PLATELET COMMENT: NORMAL
PLATELETS: 276 10*3/uL (ref 140–440)
PLATELETS: 326 10*3/uL (ref 140–440)
RBC: 4.15 10*6/uL — ABNORMAL LOW (ref 4.20–5.40)
RBC: 3.99 10*6/uL — ABNORMAL LOW (ref 4.20–5.40)
RDW: 14.4 % (ref 11.6–14.8)
RDW: 15.2 % — ABNORMAL HIGH (ref 11.6–14.8)
WBC: 9.3 10*3/uL (ref 4.0–10.5)
WBC: 11 10*3/uL — ABNORMAL HIGH (ref 4.0–10.5)
WBCS UNCORRECTED: 9.3 10*3/uL
WBCS UNCORRECTED: 11 10*3/uL

## 2021-12-23 LAB — COMPREHENSIVE METABOLIC PANEL, NON-FASTING
ALBUMIN/GLOBULIN RATIO: 1.2 (ref 0.8–1.4)
ALBUMIN/GLOBULIN RATIO: 1.3 (ref 0.8–1.4)
ALBUMIN: 3.7 g/dL (ref 3.5–5.7)
ALBUMIN: 4 g/dL (ref 3.5–5.7)
ALKALINE PHOSPHATASE: 96 U/L (ref 34–104)
ALKALINE PHOSPHATASE: 118 U/L — ABNORMAL HIGH (ref 34–104)
ALT (SGPT): 11 U/L (ref 7–52)
ALT (SGPT): 11 U/L (ref 7–52)
ANION GAP: 13 mmol/L (ref 10–20)
ANION GAP: 37 mmol/L — ABNORMAL HIGH (ref 10–20)
AST (SGOT): 14 U/L (ref 13–39)
AST (SGOT): 11 U/L — ABNORMAL LOW (ref 13–39)
BILIRUBIN TOTAL: 1 mg/dL (ref 0.3–1.2)
BILIRUBIN TOTAL: 0.5 mg/dL (ref 0.3–1.2)
BUN/CREA RATIO: 40 — ABNORMAL HIGH (ref 6–22)
BUN/CREA RATIO: 33 — ABNORMAL HIGH (ref 6–22)
BUN: 45 mg/dL — ABNORMAL HIGH (ref 7–25)
BUN: 54 mg/dL — ABNORMAL HIGH (ref 7–25)
CALCIUM, CORRECTED: 10 mg/dL (ref 8.9–10.8)
CALCIUM, CORRECTED: 9.7 mg/dL (ref 8.9–10.8)
CALCIUM: 9.7 mg/dL (ref 8.6–10.3)
CALCIUM: 9.7 mg/dL (ref 8.6–10.3)
CHLORIDE: 92 mmol/L — ABNORMAL LOW (ref 98–107)
CHLORIDE: 86 mmol/L — ABNORMAL LOW (ref 98–107)
CO2 TOTAL: 24 mmol/L (ref 21–31)
CO2 TOTAL: 6 mmol/L — CL (ref 21–31)
CREATININE: 1.13 mg/dL (ref 0.60–1.30)
CREATININE: 1.66 mg/dL — ABNORMAL HIGH (ref 0.60–1.30)
ESTIMATED GFR: 49 mL/min/{1.73_m2} — ABNORMAL LOW (ref >59)
ESTIMATED GFR: 31 mL/min/{1.73_m2} — ABNORMAL LOW (ref >59)
GLOBULIN: 3.1 (ref 2.9–5.4)
GLOBULIN: 3.1 (ref 2.9–5.4)
GLUCOSE: 348 mg/dL — ABNORMAL HIGH (ref 74–109)
GLUCOSE: 886 mg/dL (ref 74–109)
OSMOLALITY, CALCULATED: 285 mOsm/kg (ref 270–290)
OSMOLALITY, CALCULATED: 318 mOsm/kg — ABNORMAL HIGH (ref 270–290)
POTASSIUM: 5.2 mmol/L — ABNORMAL HIGH (ref 3.5–5.1)
POTASSIUM: 6.2 mmol/L (ref 3.5–5.1)
PROTEIN TOTAL: 6.8 g/dL (ref 6.4–8.9)
PROTEIN TOTAL: 7.1 g/dL (ref 6.4–8.9)
SODIUM: 129 mmol/L — ABNORMAL LOW (ref 136–145)
SODIUM: 129 mmol/L — ABNORMAL LOW (ref 136–145)

## 2021-12-23 LAB — BASIC METABOLIC PANEL
ANION GAP: 35 mmol/L — ABNORMAL HIGH (ref 10–20)
BUN/CREA RATIO: 31 — ABNORMAL HIGH (ref 6–22)
BUN: 54 mg/dL — ABNORMAL HIGH (ref 7–25)
CALCIUM: 9.2 mg/dL (ref 8.6–10.3)
CHLORIDE: 91 mmol/L — ABNORMAL LOW (ref 98–107)
CO2 TOTAL: 6 mmol/L — CL (ref 21–31)
CREATININE: 1.72 mg/dL — ABNORMAL HIGH (ref 0.60–1.30)
ESTIMATED GFR: 30 mL/min/{1.73_m2} — ABNORMAL LOW (ref >59)
GLUCOSE: 840 mg/dL (ref 74–109)
OSMOLALITY, CALCULATED: 321 mOsm/kg — ABNORMAL HIGH (ref 270–290)
POTASSIUM: 5.7 mmol/L — ABNORMAL HIGH (ref 3.5–5.1)
SODIUM: 132 mmol/L — ABNORMAL LOW (ref 136–145)

## 2021-12-23 LAB — POC BLOOD GLUCOSE (RESULTS)
GLUCOSE, POC: >600 mg/dl (ref 50–500)
GLUCOSE, POC: >600 mg/dl (ref 50–500)
GLUCOSE, POC: >600 mg/dl (ref 50–500)

## 2021-12-23 LAB — URINALYSIS, MACROSCOPIC
BILIRUBIN: NEGATIVE mg/dL
GLUCOSE: >1000 mg/dL — AB
KETONES: 100 mg/dL — AB
LEUKOCYTES: NEGATIVE WBCs/uL
NITRITE: NEGATIVE
PH: 5 (ref 5.0–9.0)
PROTEIN: NEGATIVE mg/dL
SPECIFIC GRAVITY: 1.02 (ref 1.002–1.030)
UROBILINOGEN: NORMAL mg/dL

## 2021-12-23 LAB — URINALYSIS, MICROSCOPIC
RBCS: <1 /hpf (ref <4)
WBCS: 2 /hpf (ref <6)

## 2021-12-23 LAB — ECG 12 LEAD
Calculated P Axis: 100 degrees
Calculated T Axis: -26 degrees
PR Interval: 142 ms
QRS Duration: 96 ms
QTC Calculation: 449 ms

## 2021-12-23 LAB — VENOUS BLOOD GAS/LACTATE
BASE EXCESS: -23.4 mmol/L — ABNORMAL LOW (ref <=3.0)
BASE EXCESS: -23.9 mmol/L — ABNORMAL LOW (ref <=3.0)
BICARBONATE (VENOUS): 8.6 mmol/L — ABNORMAL LOW (ref 22.0–26.0)
BICARBONATE (VENOUS): 8.8 mmol/L — ABNORMAL LOW (ref 22.0–26.0)
LACTATE: 3 mmol/L (ref <=4.0)
LACTATE: 3.4 mmol/L (ref <=4.0)
PCO2 (VENOUS): 25 mm/Hg — ABNORMAL LOW (ref 41–51)
PCO2 (VENOUS): 30 mm/Hg — ABNORMAL LOW (ref 41–51)
PH (VENOUS): 7.01 — CL (ref 7.31–7.41)
PH (VENOUS): 7.04 — CL (ref 7.31–7.41)
PO2 (VENOUS): 55 mm/Hg — ABNORMAL HIGH (ref 35–50)
PO2 (VENOUS): 74 mm/Hg — ABNORMAL HIGH (ref 35–50)

## 2021-12-23 LAB — LACTIC ACID TIMED: LACTIC ACID: 2.5 mmol/L — ABNORMAL HIGH (ref 0.5–2.2)

## 2021-12-23 LAB — LIPID PANEL
CHOL/HDL RATIO: 1.9
CHOLESTEROL: 160 mg/dL (ref <200)
HDL CHOL: 86 mg/dL (ref 23–92)
LDL CALC: 58 mg/dL (ref 0–100)
TRIGLYCERIDES: 80 mg/dL (ref <=150)
VLDL CALC: 16 mg/dL (ref 0–50)

## 2021-12-23 LAB — PT/INR
INR: 1.1 (ref <=5.00)
PROTHROMBIN TIME: 12.7 seconds (ref 9.8–12.7)

## 2021-12-23 LAB — MAGNESIUM
MAGNESIUM: 2.1 mg/dL (ref 1.9–2.7)
MAGNESIUM: 2.5 mg/dL (ref 1.9–2.7)

## 2021-12-23 LAB — TROPONIN-I: TROPONIN I: 18 ng/L — ABNORMAL HIGH (ref <15)

## 2021-12-23 LAB — PREALBUMIN: PREALBUMIN: 13.2 mg/dL — ABNORMAL LOW (ref 17.0–34.0)

## 2021-12-23 LAB — PTT (PARTIAL THROMBOPLASTIN TIME): APTT: 28.1 seconds (ref 26.0–36.0)

## 2021-12-23 LAB — BETA-HYDROXYBUTYRATE, PLASMA OR SERUM: BETA-HYDROXYBUTYRATE: >8 mmol/L — ABNORMAL HIGH (ref 0.02–0.29)

## 2021-12-23 LAB — PHOSPHORUS: PHOSPHORUS: 7.4 mg/dL — ABNORMAL HIGH (ref 3.7–7.2)

## 2021-12-23 LAB — THYROID STIMULATING HORMONE (SENSITIVE TSH): TSH: 1.043 u[IU]/mL (ref 0.450–5.330)

## 2021-12-23 LAB — HGA1C (HEMOGLOBIN A1C WITH EST AVG GLUCOSE): HEMOGLOBIN A1C: 9.6 % — ABNORMAL HIGH (ref 4.0–6.0)

## 2021-12-23 MED ORDER — ETHYL ALCOHOL 62 % (NOZIN NASAL SANITIZER) NASAL SOLUTION - BULK BOTTLE
1.0000 | Freq: Two times a day (BID) | NASAL | Status: DC
Start: 2021-12-24 — End: 2022-01-08
  Administered 2021-12-24 – 2021-12-27 (×9): 1 via NASAL
  Administered 2021-12-28: 0 via NASAL
  Administered 2021-12-28 – 2022-01-08 (×22): 1 via NASAL

## 2021-12-23 MED ORDER — MICONAZOLE NITRATE 2 % VAGINAL CREAM
1.0000 | TOPICAL_CREAM | Freq: Two times a day (BID) | VAGINAL | Status: DC
Start: 2021-12-24 — End: 2022-01-08
  Administered 2021-12-24 – 2021-12-28 (×10): 1 via TOPICAL
  Administered 2021-12-28: 0 via TOPICAL
  Administered 2021-12-29 – 2022-01-08 (×21): 1 via TOPICAL
  Filled 2021-12-23 (×5): qty 45

## 2021-12-23 MED ORDER — SODIUM CHLORIDE 0.9 % INTRAVENOUS SOLUTION
INTRAVENOUS | Status: DC
Start: 2021-12-23 — End: 2021-12-24
  Administered 2021-12-24: 0 mL via INTRAVENOUS

## 2021-12-23 MED ORDER — SODIUM CHLORIDE 0.9 % INTRAVENOUS SOLUTION
0.1000 [IU]/kg/h | INTRAVENOUS | Status: DC
Start: 2021-12-23 — End: 2021-12-24
  Administered 2021-12-23: 15 [IU]/kg/h via INTRAVENOUS
  Administered 2021-12-24: 0.016 [IU]/kg/h via INTRAVENOUS
  Administered 2021-12-24: 0.031 [IU]/kg/h via INTRAVENOUS
  Administered 2021-12-24: 0.047 [IU]/kg/h via INTRAVENOUS
  Administered 2021-12-24: 0.016 [IU]/kg/h via INTRAVENOUS
  Administered 2021-12-24 (×2): 0 [IU]/kg/h via INTRAVENOUS
  Administered 2021-12-24: 0.016 [IU]/kg/h via INTRAVENOUS
  Administered 2021-12-24: 0.047 [IU]/kg/h via INTRAVENOUS
  Filled 2021-12-23 (×3): qty 100

## 2021-12-23 MED ORDER — SODIUM CHLORIDE 0.9 % IV BOLUS
1000.0000 mL | INJECTION | Status: AC
Start: 2021-12-23 — End: 2021-12-23
  Administered 2021-12-23: 1000 mL via INTRAVENOUS

## 2021-12-23 MED ORDER — HEPARIN (PORCINE) 5,000 UNIT/ML INJECTION SOLUTION
5000.0000 [IU] | Freq: Three times a day (TID) | INTRAMUSCULAR | Status: DC
Start: 2021-12-24 — End: 2021-12-29
  Administered 2021-12-24 – 2021-12-28 (×15): 5000 [IU] via SUBCUTANEOUS
  Administered 2021-12-28: 0 [IU] via SUBCUTANEOUS
  Administered 2021-12-29 (×3): 5000 [IU] via SUBCUTANEOUS
  Filled 2021-12-23 (×19): qty 1

## 2021-12-23 MED ORDER — SODIUM CHLORIDE 0.9 % INTRAVENOUS SOLUTION
INTRAVENOUS | Status: DC
Start: 2021-12-24 — End: 2021-12-24
  Administered 2021-12-24: 0 mL via INTRAVENOUS

## 2021-12-23 MED ORDER — DEXTROSE 50 % IN WATER (D50W) INTRAVENOUS SYRINGE
12.5000 g | INJECTION | INTRAVENOUS | Status: DC | PRN
Start: 2021-12-23 — End: 2021-12-24

## 2021-12-23 NOTE — ED Nurses Note (Signed)
TO CT @ THIS TIME VIA EMS.

## 2021-12-23 NOTE — ED Triage Notes (Signed)
LAST KNOWN WELL 1800, EMS REPORTS RIGHT SIDE FACIAL DROOP, ALTERED MENTAL STATUS.     22G LAC.

## 2021-12-23 NOTE — H&P (Signed)
[place]    HOSPITALIST H&P    Jordan Buckley 81 y.o. female ED07/ED07   Date of Service: 12/23/2021    Date of Admission:  12/23/2021   PCP: Lerry Paterson, PA-C Code Status:Prior       Chief Complaint:  Altered mental status    HPI:   Jordan Buckley is an 81 year old female who presented to the ER on 12/23/21 from nursing home with reports of right facial droop, right arm weakness, and unintelligible speech.  Last known normal was at 1800 pm.  At baseline, she is alert and awake, able to converse.  She does have dementia.  Labs upon arrival to ER were as follows:  WBC 11.0, Hgb 12.2, sodium 129, potassium 6.2, CO2 6, BUN 54, creatinine 1.66, glucose 886, anion gap 37, trop 18, B-hydroxybutyrate > 8.  ABG showing ph 7.04, pCO2 25, pO2 74, HCO3 8.8, BE -23.9.  CT head negative.  UA showing red, glucose > 1000, 100 ketones, small blood.  The patient was seen examined for hospitalist admission while in ER.  Patient opening, only verbalization was unintelligible.  Insulin drip at 15 units/hour.  Oxygen saturation 100% on 4 L, so oxygen cut down to 3.  Review of systems limited due to patient's mental status.      ED medications:   Medications Administered in the ED   insulin regular human 100 Units in NS 100 mL infusion (has no administration in time range)   dextrose 50% (0.5 g/mL) injection - syringe (has no administration in time range)   NS premix infusion (has no administration in time range)   NS bolus infusion 1,000 mL (1,000 mL Intravenous New Bag/New Syringe 12/23/21 2047)         PMHx:    Past Medical History:   Diagnosis Date   . Alzheimer's dementia (CMS [place])    . Arthropathy    . Cancer (CMS [place])    . Coronary artery disease    . CVA (cerebrovascular accident) (CMS [place])    . Diabetes mellitus, type 2 (CMS [facility])    . Hard of hearing    . HTN (hypertension)    . Osteoporosis    . Thyroid disease    . Wears glasses         PSHx:   Past Surgical History:   Procedure  Laterality Date   . FIXATION KYPHOPLASTY Right 11/27/2021   . HX BACK SURGERY     . HX CATARACT REMOVAL     . HX CESAREAN SECTION     . HX CHOLECYSTECTOMY     . HX CORONARY ARTERY BYPASS GRAFT     . HX HYSTERECTOMY     . KIDNEY SURGERY Left     cancerous tumor removed   . LIVER RESECTION     . WRIST SURGERY Left           Allergies:    Allergies   Allergen Reactions   . Cefaclor Rash   . Nitrofurantoin Rash   . Quinine Rash   . Sulfa (Sulfonamides) Rash   . Ciprofloxacin    . Dulaglutide    . Fluconazole    . Nitrofurantoin Macrocrystal    . Quinidine-Quinine Analogues (Cinchona Alkaloids)     Social History  Social History     Tobacco Use   . Smoking status: Never   . Smokeless tobacco: Never   Vaping Use   . Vaping Use: Never used  Substance Use Topics   . Alcohol use: Never   . Drug use: Never       Family History  Family Medical History:     Problem Relation (Age of Onset)    No Known Problems Mother, Father             Home Meds:      Prior to Admission medications    Medication Sig Start Date End Date Taking? Authorizing Provider   apixaban (ELIQUIS) 2.5 mg Oral Tablet Take 2 Tablets (5 mg total) by mouth Twice daily    Provider, Historical   atorvastatin (LIPITOR) 20 mg Oral Tablet Take 1 Tablet (20 mg total) by mouth Every evening    Provider, Historical   docusate sodium (COLACE) 100 mg Oral Capsule Take 1 Capsule (100 mg total) by mouth Once a day 12/20/21   Sherol Dade, PA-C   ergocalciferol, vitamin D2, (DRISDOL) 1,250 mcg (50,000 unit) Oral Capsule Vitamin D2 1,250 mcg (50,000 unit) capsule   Take by oral route.    Provider, Historical   ferrous sulfate (FEOSOL) 325 mg (65 mg iron) Oral Tablet Take 1 Tablet (325 mg total) by mouth Twice daily with food    Provider, Historical   HYDROcodone-acetaminophen (NORCO) 5-325 mg Oral Tablet Take 1 Tablet by mouth Every 4 hours as needed 12/20/21   Sherol Dade, PA-C   insulin R human (HUMULIN R) 100 units/mL Subcutaneous Injectable Inject 0-12 Units  under the skin Four times a day as needed for Other CAUTION:  "High Alert" Medication. 12/20/21   Sherol Dade, PA-C   levothyroxine (SYNTHROID) 75 mcg Oral Tablet Take 1 Tablet (75 mcg total) by mouth Every morning    Provider, Historical   magnesium oxide (MAG-OX) 400 mg Oral Tablet Take 1 Tablet (400 mg total) by mouth Twice daily    Provider, Historical   MetFORMIN (GLUCOPHAGE) 1,000 mg Oral Tablet Take 1 Tablet (1,000 mg total) by mouth Twice daily with food    Provider, Historical   ondansetron (ZOFRAN ODT) 4 mg Oral Tablet, Rapid Dissolve Take 1 Tablet (4 mg total) by mouth Every 8 hours as needed for Nausea/Vomiting 12/03/21   Benard Rink, APRN   pramipexole (MIRAPEX) 0.125 mg Oral Tablet Take 1 Tablet (0.125 mg total) by mouth Three times a day    Provider, Historical   amLODIPine (NORVASC) 5 mg Oral Tablet Take 1 Tablet (5 mg total) by mouth Once a day  12/03/21  Provider, Historical   amoxicillin-pot clavulanate (AUGMENTIN) 875-125 mg Oral Tablet Take 1 Tablet by mouth Twice daily for 10 days 12/03/21 12/16/21  Benard Rink, APRN   BASAGLAR KWIKPEN U-100 INSULIN 100 unit/mL (3 mL) Subcutaneous injection (KwikPen)  09/10/21 12/20/21  Provider, Historical   carvediloL (COREG) 3.125 mg Oral Tablet Take 1 Tablet (3.125 mg total) by mouth Twice daily with food  12/03/21  Provider, Historical   insulin lispro (HUMALOG KWIKPEN INSULIN) 200 unit/mL (3 mL) Subcutaneous Insulin Pen 10 Units Once a day 12/17/17 12/20/21  Provider, Historical   loratadine (CLARITIN) 10 mg Oral Tablet Take 1 Tablet (10 mg total) by mouth Once a day  12/03/21  Provider, Historical   naproxen (NAPROSYN) 500 mg Oral Tablet Take 1 Tablet (500 mg total) by mouth Twice daily with food 10/29/21 12/16/21  Elder, Gildardo Cranker, MD   NOVOLOG FLEXPEN U-100 INSULIN 100 unit/mL (3 mL) Subcutaneous Insulin Pen  09/10/21 12/20/21  Provider, Historical   predniSONE (DELTASONE) 50 mg Oral Tablet Take  1 Tablet (50 mg total) by mouth Once a day for 5 days  10/29/21 11/27/21  Winfred Burn, MD   sertraline (ZOLOFT) 100 mg Oral Tablet Take 1 Tablet (100 mg total) by mouth Once a day  12/03/21  Provider, Historical   traMADoL (ULTRAM) 50 mg Oral Tablet Take by mouth Every 6 hours as needed for Pain  12/20/21  Provider, Historical          ROS:   Limited due to altered mental status.  Nonverbal.      Results for orders placed or performed during the hospital encounter of 12/23/21 (from the past 24 hour(s))   ECG 12 LEAD   Result Value Ref Range    Ventricular rate 117 BPM    Atrial Rate 117 BPM    PR Interval 142 ms    QRS Duration 96 ms    QT Interval 322 ms    QTC Calculation 449 ms    Calculated P Axis 100 degrees    Calculated R Axis 91 degrees    Calculated T Axis -26 degrees   COMPREHENSIVE METABOLIC PANEL, NON-FASTING   Result Value Ref Range    SODIUM 129 (L) 136 - 145 mmol/L    POTASSIUM 6.2 (HH) 3.5 - 5.1 mmol/L    CHLORIDE 86 (L) 98 - 107 mmol/L    CO2 TOTAL 6 (LL) 21 - 31 mmol/L    ANION GAP 37 (H) 10 - 20 mmol/L    BUN 54 (H) 7 - 25 mg/dL    CREATININE 1.66 (H) 0.60 - 1.30 mg/dL    BUN/CREA RATIO 33 (H) 6 - 22    ESTIMATED GFR 31 (L) >59 mL/min/1.63m2    ALBUMIN 4.0 3.5 - 5.7 g/dL    CALCIUM 9.7 8.6 - 10.3 mg/dL    GLUCOSE 886 (HH) 74 - 109 mg/dL    ALKALINE PHOSPHATASE 118 (H) 34 - 104 U/L    ALT (SGPT) 11 7 - 52 U/L    AST (SGOT) 11 (L) 13 - 39 U/L    BILIRUBIN TOTAL 0.5 0.3 - 1.2 mg/dL    PROTEIN TOTAL 7.1 6.4 - 8.9 g/dL    ALBUMIN/GLOBULIN RATIO 1.3 0.8 - 1.4    OSMOLALITY, CALCULATED 318 (H) 270 - 290 mOsm/kg    CALCIUM, CORRECTED 9.7 8.9 - 10.8 mg/dL    GLOBULIN 3.1 2.9 - 5.4   PT/INR   Result Value Ref Range    PROTHROMBIN TIME 12.7 9.8 - 12.7 seconds    INR 1.10 <=5.00   PTT (PARTIAL THROMBOPLASTIN TIME)   Result Value Ref Range    APTT 28.1 26.0 - 36.0 seconds   TROPONIN-I   Result Value Ref Range    TROPONIN I 18 (H) <15 ng/L   CBC WITH DIFF   Result Value Ref Range    WBCS UNCORRECTED 11.0 x10^3/uL    WBC 11.0 (H) 4.0 - 10.5 x10^3/uL    RBC 3.99 (L)  4.20 - 5.40 x10^6/uL    HGB 12.2 (L) 12.5 - 16.0 g/dL    HCT 40.4 37.0 - 47.0 %    MCV 101.1 (H) 78.0 - 99.0 fL    MCH 30.4 27.0 - 32.0 pg    MCHC 30.1 (L) 32.0 - 36.0 g/dL    RDW 15.2 (H) 11.6 - 14.8 %    PLATELETS 326 140 - 440 x10^3/uL    MPV 7.0 (L) 7.4 - 10.4 fL    NEUTROPHIL % 96 (H) 40 - 76 %  LYMPHOCYTE % 2 (L) 25 - 45 %    MONOCYTE % 2 0 - 12 %    EOSINOPHIL % 0 0 - 7 %    BASOPHIL % 0 0 - 3 %    NEUTROPHIL # 10.60 (H) 1.80 - 8.40 x10^3/uL    LYMPHOCYTE # 0.20 (L) 1.10 - 5.00 x10^3/uL    MONOCYTE # 0.20 0.00 - 1.30 x10^3/uL    EOSINOPHIL # 0.00 0.00 - 0.80 x10^3/uL    BASOPHIL # 0.00 0.00 - 0.30 x10^3/uL    RBC COMMENT slight aniso     PLATELET COMMENT plt appear normal    BETA-HYDROXYBUTYRATE, PLASMA OR SERUM   Result Value Ref Range    BETA-HYDROXYBUTYRATE >8.00 (H) 0.02 - 0.29 mmol/L   MAGNESIUM - STAT   Result Value Ref Range    MAGNESIUM 2.5 1.9 - 2.7 mg/dL   PHOSPHORUS - STAT   Result Value Ref Range    PHOSPHORUS 7.4 (H) 3.7 - 7.2 mg/dL   VENOUS BLOOD GAS/LACTATE   Result Value Ref Range    PH (VENOUS) 7.04 (LL) 7.31 - 7.41    PCO2 (VENOUS) 25 (L) 41 - 51 mm/Hg    PO2 (VENOUS) 74 (H) 35 - 50 mm/Hg    BICARBONATE (VENOUS) 8.8 (L) 22.0 - 26.0 mmol/L    BASE EXCESS -23.9 (L) -3.0 - 3.0 mmol/L    LACTATE 3.0 <=4.0 mmol/L   POC BLOOD GLUCOSE (RESULTS)   Result Value Ref Range    GLUCOSE, POC >600 (HH) 50 - 500 mg/dl   POC BLOOD GLUCOSE (RESULTS)   Result Value Ref Range    GLUCOSE, POC >600 (HH) 50 - 500 mg/dl   URINALYSIS, MACROSCOPIC   Result Value Ref Range    COLOR Red (A) Colorless, Light Yellow, Yellow    APPEARANCE Clear Clear    SPECIFIC GRAVITY 1.020 1.002 - 1.030    PH 5.0 5.0 - 9.0    LEUKOCYTES Negative Negative, 100  WBCs/uL    NITRITE Negative Negative    PROTEIN Negative Negative, 10 , 20  mg/dL    GLUCOSE >1000 (A) Negative, 30  mg/dL    KETONES 100 (A) Negative, Trace mg/dL    BILIRUBIN Negative Negative, 0.5 mg/dL    BLOOD Small (A) Negative, Trace mg/dL    UROBILINOGEN Normal Normal  mg/dL   URINALYSIS, MICROSCOPIC   Result Value Ref Range    MUCOUS Rare (A) (none) /hpf    BUDDING YEAST Rare (A) (none) /hpf    RBCS <1 <4 /hpf    WBCS 2 <6 /hpf   LACTIC ACID TIMED   Result Value Ref Range    LACTIC ACID 2.5 (H) 0.5 - 2.2 mmol/L   BASIC METABOLIC PANEL   Result Value Ref Range    SODIUM 132 (L) 136 - 145 mmol/L    POTASSIUM 5.7 (H) 3.5 - 5.1 mmol/L    CHLORIDE 91 (L) 98 - 107 mmol/L    CO2 TOTAL 6 (LL) 21 - 31 mmol/L    ANION GAP 35 (H) 10 - 20 mmol/L    CALCIUM 9.2 8.6 - 10.3 mg/dL    GLUCOSE 840 (HH) 74 - 109 mg/dL    BUN 54 (H) 7 - 25 mg/dL    CREATININE 1.72 (H) 0.60 - 1.30 mg/dL    BUN/CREA RATIO 31 (H) 6 - 22    ESTIMATED GFR 30 (L) >59 mL/min/1.40m2    OSMOLALITY, CALCULATED 321 (H) 270 - 290 mOsm/kg   POC BLOOD  GLUCOSE (RESULTS)   Result Value Ref Range    GLUCOSE, POC >600 (HH) 50 - 500 mg/dl   VENOUS BLOOD GAS/LACTATE   Result Value Ref Range    PH (VENOUS) 7.01 (LL) 7.31 - 7.41    PCO2 (VENOUS) 30 (L) 41 - 51 mm/Hg    PO2 (VENOUS) 55 (H) 35 - 50 mm/Hg    BICARBONATE (VENOUS) 8.6 (L) 22.0 - 26.0 mmol/L    BASE EXCESS -23.4 (L) -3.0 - 3.0 mmol/L    LACTATE 3.4 <=4.0 mmol/L   Results for orders placed or performed in visit on 12/23/21 (from the past 24 hour(s))   COMPREHENSIVE METABOLIC PANEL, NON-FASTING   Result Value Ref Range    SODIUM 129 (L) 136 - 145 mmol/L    POTASSIUM 5.2 (H) 3.5 - 5.1 mmol/L    CHLORIDE 92 (L) 98 - 107 mmol/L    CO2 TOTAL 24 21 - 31 mmol/L    ANION GAP 13 10 - 20 mmol/L    BUN 45 (H) 7 - 25 mg/dL    CREATININE 1.13 0.60 - 1.30 mg/dL    BUN/CREA RATIO 40 (H) 6 - 22    ESTIMATED GFR 49 (L) >59 mL/min/1.72m2    ALBUMIN 3.7 3.5 - 5.7 g/dL    CALCIUM 9.7 8.6 - 10.3 mg/dL    GLUCOSE 348 (H) 74 - 109 mg/dL    ALKALINE PHOSPHATASE 96 34 - 104 U/L    ALT (SGPT) 11 7 - 52 U/L    AST (SGOT) 14 13 - 39 U/L    BILIRUBIN TOTAL 1.0 0.3 - 1.2 mg/dL    PROTEIN TOTAL 6.8 6.4 - 8.9 g/dL    ALBUMIN/GLOBULIN RATIO 1.2 0.8 - 1.4    OSMOLALITY, CALCULATED 285 270 - 290 mOsm/kg     CALCIUM, CORRECTED 10.0 8.9 - 10.8 mg/dL    GLOBULIN 3.1 2.9 - 5.4   LIPID PANEL   Result Value Ref Range    CHOLESTEROL 160 <200 mg/dL    TRIGLYCERIDES 80 <=150 mg/dL    HDL CHOL 86 23 - 92 mg/dL    LDL CALC 58 0 - 100 mg/dL    VLDL CALC 16 0 - 50 mg/dL    CHOL/HDL RATIO 1.9    HGA1C (HEMOGLOBIN A1C WITH EST AVG GLUCOSE)   Result Value Ref Range    HEMOGLOBIN A1C 9.6 (H) 4.0 - 6.0 %   MAGNESIUM   Result Value Ref Range    MAGNESIUM 2.1 1.9 - 2.7 mg/dL   PREALBUMIN   Result Value Ref Range    PREALBUMIN 13.2 (L) 17.0 - 34.0 mg/dL   THYROID STIMULATING HORMONE (SENSITIVE TSH)   Result Value Ref Range    TSH 1.043 0.450 - 5.330 uIU/mL   CBC WITH DIFF   Result Value Ref Range    WBCS UNCORRECTED 9.3 x10^3/uL    WBC 9.3 4.0 - 10.5 x10^3/uL    RBC 4.15 (L) 4.20 - 5.40 x10^6/uL    HGB 12.6 12.5 - 16.0 g/dL    HCT 37.6 37.0 - 47.0 %    MCV 90.4 78.0 - 99.0 fL    MCH 30.4 27.0 - 32.0 pg    MCHC 33.6 32.0 - 36.0 g/dL    RDW 14.4 11.6 - 14.8 %    PLATELETS 276 140 - 440 x10^3/uL    MPV 7.3 (L) 7.4 - 10.4 fL    NEUTROPHIL % 80 (H) 40 - 76 %    LYMPHOCYTE % 12 (L) 25 -  45 %    MONOCYTE % 6 0 - 12 %    EOSINOPHIL % 1 0 - 7 %    BASOPHIL % 1 0 - 3 %    NEUTROPHIL # 7.40 1.80 - 8.40 x10^3/uL    LYMPHOCYTE # 1.10 1.10 - 5.00 x10^3/uL    MONOCYTE # 0.60 0.00 - 1.30 x10^3/uL    EOSINOPHIL # 0.10 0.00 - 0.80 x10^3/uL    BASOPHIL # 0.00 0.00 - 0.30 x10^3/uL          Physical:  Filed Vitals:    12/23/21 2045 12/23/21 2130 12/23/21 2230 12/23/21 2315   BP: (!) 112/29 (!) 103/45 (!) 88/54 (!) 81/36   Pulse: (!) 115 (!) 116 (!) 117 (!) 112   Resp: '17 18 19 17   '$ Temp:       SpO2:    99%      General: Patient is alert, lethargic, does not follow commands, moves all extremities, only verbalization is unintelligible.  No acute distress.   Head: Normocephalic and atraumatic.    Eyes: Pupils equally round and react to light and accommodate. Extraocular movements intact.  Conjunctiva normal. Sclerae are normal.    Nose: Nasal passages clear.  Mucosa moist.    Throat: Moist oral mucosa. No erythema or exudate of the pharynx. Clear oropharynx.    Neck: Supple. No cervical lymphadenopathy or supraclavicular nodes detected. Trachea midline   Heart: Regular rate and rhythm. S1 & S2 present. No S3 or S4. No rubs, gallops, or murmurs appreciated.  Radial and dorsalis pedis pulses +2/4 bilaterally.  Brisk capillary refill.    Lungs: Clear to auscultation bilaterally with no wheezes or rales. Equal chest excursion.  No conversational dyspnea. No respiratory distress noted.   Abdomen: Soft, nontender, nondistended belly. Bowel sounds are present in all four quadrants. No rigidity.  No guarding.  No ascites.   Extremities: No edema, cyanosis, or clubbing. Grossly moves all extremities.    Skin: Warm and dry without lesions. No ecchymosis noted.    Neurologic:  Limited exam due to altered mental status and lack of cooperation   Genitourinary:  Wearing diaper, incontinence, Foley ordered.  Psychiatric:  Limited exam.            Assessments:  Active Hospital Problems   (*Primary Problem)    Diagnosis   . *DKA (diabetic ketoacidosis) (CMS [facility])   . Hyperkalemia   . Hyponatremia   . AKI (acute kidney injury) (CMS [facility])   . Acute metabolic encephalopathy   . DKA, type 2 (CMS [place])     1.  DKA   The patient will be admitted to CCU with insulin drip per protocol.  She will have hourly Accu-Cheks.  She will have BMPLs q2 hours.  The patient will be kept NPO at this time.    2.  Hyperkalemia   Potassium was 6.2 on arrival to ER, but now down to 5.7.  BNP 0 ordered q.2 hours.    3.  Hyponatremia   Due to elevated blood sugar.  Normal saline infusing at 125 mL/hour.    4.  AKI   IV fluids continue at 125 mL/hour.  Labs will continued to be monitored.    5.  Acute metabolic encephalopathy   Treatment of underlying cause of hyperglycemia.      6.  DVT prophylaxis   Heparin      Plan:  Patient will be admitted to ICU/CCU for the above problems.  Further orders will depend  upon  clinical course.  The Hospitalist personally evaluated and examined the patient in conjunction with the MLP and agree with the assessments, treatment plan and disposition of the patient as recorded by the [facility].      Code status: Prior  DVT prophylaxis:  Heparin  Diet: DIET NPO - NOW    Disposition:  The patient is currently acutely ill requiring treatment in CCU for inpatient admission. Patient will be closely evaluated monitor and remove be adjusted accordingly.  Estimated length of stay greater than 48 hr to obtain full medical treatment.      Evette Doffing, FNP-BC    [place] HOSPITALIST

## 2021-12-23 NOTE — ED Provider Notes (Signed)
Emergency Medicine    Name: Jordan Buckley  Age and Gender: 81 y.o. female  Date of Birth: [birth year]  MRN: [id number]  PCP: Lerry Paterson, PA-C    CC:  Chief Complaint   Patient presents with   . Stroke Alert       HPI:  Jordan Buckley is a 81 y.o. White female with history of right facial droop, right arm weakness and unintelligible speech.  Last known normal 1800.  She has Alzheimer's disease but per medics is normally alert and converses.  She had no known trauma.  She is on Eliquis.    Recently moved to nursing home.    She was noted by EMS to be hyperglycemic.    She began to move her right arm on arrival but still isn't speaking.          Below pertinent information reviewed with patient:  Past Medical History:   Diagnosis Date   . Alzheimer's dementia (CMS [place])    . Arthropathy    . Cancer (CMS [place])    . Coronary artery disease    . CVA (cerebrovascular accident) (CMS [place])    . Diabetes mellitus, type 2 (CMS [facility])    . Hard of hearing    . HTN (hypertension)    . Osteoporosis    . Thyroid disease    . Wears glasses            Allergies   Allergen Reactions   . Cefaclor Rash   . Nitrofurantoin Rash   . Quinine Rash   . Sulfa (Sulfonamides) Rash   . Ciprofloxacin    . Dulaglutide    . Fluconazole    . Nitrofurantoin Macrocrystal    . Quinidine-Quinine Analogues ([place] Alkaloids)        Past Surgical History:   Procedure Laterality Date   . FIXATION KYPHOPLASTY Right 11/27/2021   . HX BACK SURGERY     . HX CATARACT REMOVAL     . HX CESAREAN SECTION     . HX CHOLECYSTECTOMY     . HX CORONARY ARTERY BYPASS GRAFT     . HX HYSTERECTOMY     . KIDNEY SURGERY Left     cancerous tumor removed   . LIVER RESECTION     . WRIST SURGERY Left            Social History        Objective:    ED Triage Vitals   BP (Non-Invasive) 12/23/21 1945 103/65   Heart Rate 12/23/21 1945 (!) 113   Respiratory Rate 12/23/21 1945 20   Temperature 12/23/21 1945 35.3 C (95.6 F)   SpO2 12/23/21 1945 92 %   Weight 12/23/21 2106 63.5  kg (140 lb)   Height --      Filed Vitals:    12/23/21 1945 12/23/21 2015 12/23/21 2030 12/23/21 2045   BP: 103/65 (!) 116/37 111/82 (!) 112/29   Pulse: (!) 113 (!) 113 (!) 117 (!) 115   Resp: '20 20 20 17   '$ Temp: 35.3 C (95.6 F)      SpO2: 92%  98%        Nursing notes and vital signs reviewed.    Constitutional - No acute distress.  Alert and Active.  HEENT - Normocephalic. Atraumatic. PERRL. EOMI. Conjunctiva clear. Oropharynx with no erythema, lesions, or exudates. Moist mucous membranes.   Neck - Trachea midline. No stridor. No hoarseness.  Cardiac - Regular rate and rhythm. No  murmurs, rubs, or gallops.  Respiratory - Clear to auscultation bilaterally. No rales, wheezes or rhonchi.  Abdomen - Non-tender, soft, non-distended. No rebound or guarding.   Musculoskeletal - Good AROM. No muscle or joint tenderness appreciated. No clubbing, cyanosis or edema.  Skin - Warm and dry, without any rashes or other lesions.  Neuro - Cranial nerves II-XII are grossly intact.  Moving all extremities symmetrically.    Any pertinent labs and imaging obtained during this encounter reviewed below in MDM.    MDM/ED Course:    MDM      EKG shows a sinus tachycardia, rate of 117. Normal PR and QRS.  Inferior T wave inversions.  No STEMI>    Orders Placed This Encounter   . CT BRAIN WO IV CONTRAST   . XR AP MOBILE CHEST   . CBC/DIFF   . COMPREHENSIVE METABOLIC PANEL, NON-FASTING   . PT/INR   . PTT (PARTIAL THROMBOPLASTIN TIME)   . TROPONIN-I   . URINALYSIS, MACROSCOPIC AND MICROSCOPIC W/CULTURE REFLEX   . CBC WITH DIFF   . URINALYSIS, MACROSCOPIC   . URINALYSIS, MICROSCOPIC   . VENOUS BLOOD GAS/LACTATE   . LACTIC ACID TIMED   . BETA-HYDROXYBUTYRATE, PLASMA OR SERUM   . MAGNESIUM - STAT   . PHOSPHORUS - STAT   . BASIC METABOLIC PANEL   . VENOUS BLOOD GAS/LACTATE   . PHOSPHORUS - TO BE DRAWN 7 HOURS AFTER INITIATION OF INSULIN INFUSION   . ECG 12 LEAD   . PERFORM POC WHOLE BLOOD GLUCOSE   . POCT WHOLE BLOOD GLUCOSE EVERY HOUR   .  INSERT & MAINTAIN PERIPHERAL IV ACCESS   . 2nd Site - INSERT & MAINTAIN PERIPHERAL IV ACCESS   . NS bolus infusion 1,000 mL   . insulin regular human 100 Units in NS 100 mL infusion   . dextrose 50% (0.5 g/mL) injection - syringe   . NS premix infusion       There was no indication for administration of aspirin in this patient today.    Any procedures:  Procedures    Impression:   Clinical Impression   DKA (diabetic ketoacidosis) (CMS HCC) (Primary)       Disposition: Admitted          Portions of this note may have been dictated using voice recognition software.     Cruzita Lederer, MD  [facility] ED    -----------------------  Results for orders placed or performed during the hospital encounter of 12/23/21 (from the past 12 hour(s))   COMPREHENSIVE METABOLIC PANEL, NON-FASTING   Result Value Ref Range    SODIUM 129 (L) 136 - 145 mmol/L    POTASSIUM 6.2 (HH) 3.5 - 5.1 mmol/L    CHLORIDE 86 (L) 98 - 107 mmol/L    CO2 TOTAL 6 (LL) 21 - 31 mmol/L    ANION GAP 37 (H) 10 - 20 mmol/L    BUN 54 (H) 7 - 25 mg/dL    CREATININE 1.66 (H) 0.60 - 1.30 mg/dL    BUN/CREA RATIO 33 (H) 6 - 22    ESTIMATED GFR 31 (L) >59 mL/min/1.24m2    ALBUMIN 4.0 3.5 - 5.7 g/dL    CALCIUM 9.7 8.6 - 10.3 mg/dL    GLUCOSE 886 (HH) 74 - 109 mg/dL    ALKALINE PHOSPHATASE 118 (H) 34 - 104 U/L    ALT (SGPT) 11 7 - 52 U/L    AST (SGOT) 11 (L) 13 - 39 U/L  BILIRUBIN TOTAL 0.5 0.3 - 1.2 mg/dL    PROTEIN TOTAL 7.1 6.4 - 8.9 g/dL    ALBUMIN/GLOBULIN RATIO 1.3 0.8 - 1.4    OSMOLALITY, CALCULATED 318 (H) 270 - 290 mOsm/kg    CALCIUM, CORRECTED 9.7 8.9 - 10.8 mg/dL    GLOBULIN 3.1 2.9 - 5.4   PT/INR   Result Value Ref Range    PROTHROMBIN TIME 12.7 9.8 - 12.7 seconds    INR 1.10 <=5.00   PTT (PARTIAL THROMBOPLASTIN TIME)   Result Value Ref Range    APTT 28.1 26.0 - 36.0 seconds   TROPONIN-I   Result Value Ref Range    TROPONIN I 18 (H) <15 ng/L   CBC WITH DIFF   Result Value Ref Range    WBCS UNCORRECTED 11.0 x10^3/uL    WBC 11.0 (H) 4.0 - 10.5  x10^3/uL    RBC 3.99 (L) 4.20 - 5.40 x10^6/uL    HGB 12.2 (L) 12.5 - 16.0 g/dL    HCT 40.4 37.0 - 47.0 %    MCV 101.1 (H) 78.0 - 99.0 fL    MCH 30.4 27.0 - 32.0 pg    MCHC 30.1 (L) 32.0 - 36.0 g/dL    RDW 15.2 (H) 11.6 - 14.8 %    PLATELETS 326 140 - 440 x10^3/uL    MPV 7.0 (L) 7.4 - 10.4 fL    NEUTROPHIL % 96 (H) 40 - 76 %    LYMPHOCYTE % 2 (L) 25 - 45 %    MONOCYTE % 2 0 - 12 %    EOSINOPHIL % 0 0 - 7 %    BASOPHIL % 0 0 - 3 %    NEUTROPHIL # 10.60 (H) 1.80 - 8.40 x10^3/uL    LYMPHOCYTE # 0.20 (L) 1.10 - 5.00 x10^3/uL    MONOCYTE # 0.20 0.00 - 1.30 x10^3/uL    EOSINOPHIL # 0.00 0.00 - 0.80 x10^3/uL    BASOPHIL # 0.00 0.00 - 0.30 x10^3/uL    RBC COMMENT slight aniso     PLATELET COMMENT plt appear normal    BETA-HYDROXYBUTYRATE, PLASMA OR SERUM   Result Value Ref Range    BETA-HYDROXYBUTYRATE >8.00 (H) 0.02 - 0.29 mmol/L   MAGNESIUM - STAT   Result Value Ref Range    MAGNESIUM 2.5 1.9 - 2.7 mg/dL   PHOSPHORUS - STAT   Result Value Ref Range    PHOSPHORUS 7.4 (H) 3.7 - 7.2 mg/dL   VENOUS BLOOD GAS/LACTATE   Result Value Ref Range    PH (VENOUS) 7.04 (LL) 7.31 - 7.41    PCO2 (VENOUS) 25 (L) 41 - 51 mm/Hg    PO2 (VENOUS) 74 (H) 35 - 50 mm/Hg    BICARBONATE (VENOUS) 8.8 (L) 22.0 - 26.0 mmol/L    BASE EXCESS -23.9 (L) -3.0 - 3.0 mmol/L    LACTATE 3.0 <=4.0 mmol/L   POC BLOOD GLUCOSE (RESULTS)   Result Value Ref Range    GLUCOSE, POC >600 (HH) 50 - 500 mg/dl   POC BLOOD GLUCOSE (RESULTS)   Result Value Ref Range    GLUCOSE, POC >600 (HH) 50 - 500 mg/dl     CT BRAIN WO IV CONTRAST   Final Result   CHRONIC CHANGES.  NO ACUTE FINDINGS.          One or more dose reduction techniques were used (e.g., Automated exposure control, adjustment of the mA and/or kV according to patient size, use of iterative reconstruction technique).  Radiologist location ID: [id number]         XR AP MOBILE CHEST    (Results Pending)

## 2021-12-24 LAB — BASIC METABOLIC PANEL
ANION GAP: 15 mmol/L (ref 10–20)
ANION GAP: 21 mmol/L — ABNORMAL HIGH (ref 10–20)
ANION GAP: 30 mmol/L — ABNORMAL HIGH (ref 10–20)
ANION GAP: 34 mmol/L — ABNORMAL HIGH (ref 10–20)
ANION GAP: 5 mmol/L — ABNORMAL LOW (ref 10–20)
ANION GAP: 8 mmol/L — ABNORMAL LOW (ref 10–20)
BUN/CREA RATIO: 29 — ABNORMAL HIGH (ref 6–22)
BUN/CREA RATIO: 30 — ABNORMAL HIGH (ref 6–22)
BUN/CREA RATIO: 31 — ABNORMAL HIGH (ref 6–22)
BUN/CREA RATIO: 34 — ABNORMAL HIGH (ref 6–22)
BUN/CREA RATIO: 37 — ABNORMAL HIGH (ref 6–22)
BUN/CREA RATIO: 38 — ABNORMAL HIGH (ref 6–22)
BUN: 39 mg/dL — ABNORMAL HIGH (ref 7–25)
BUN: 43 mg/dL — ABNORMAL HIGH (ref 7–25)
BUN: 51 mg/dL — ABNORMAL HIGH (ref 7–25)
BUN: 53 mg/dL — ABNORMAL HIGH (ref 7–25)
BUN: 55 mg/dL — ABNORMAL HIGH (ref 7–25)
BUN: 56 mg/dL — ABNORMAL HIGH (ref 7–25)
CALCIUM: 8.8 mg/dL (ref 8.6–10.3)
CALCIUM: 9.1 mg/dL (ref 8.6–10.3)
CALCIUM: 9.1 mg/dL (ref 8.6–10.3)
CALCIUM: 9.2 mg/dL (ref 8.6–10.3)
CALCIUM: 9.3 mg/dL (ref 8.6–10.3)
CALCIUM: 9.3 mg/dL (ref 8.6–10.3)
CHLORIDE: 104 mmol/L (ref 98–107)
CHLORIDE: 108 mmol/L — ABNORMAL HIGH (ref 98–107)
CHLORIDE: 108 mmol/L — ABNORMAL HIGH (ref 98–107)
CHLORIDE: 110 mmol/L — ABNORMAL HIGH (ref 98–107)
CHLORIDE: 94 mmol/L — ABNORMAL LOW (ref 98–107)
CHLORIDE: 97 mmol/L — ABNORMAL LOW (ref 98–107)
CO2 TOTAL: 10 mmol/L — ABNORMAL LOW (ref 21–31)
CO2 TOTAL: 12 mmol/L — ABNORMAL LOW (ref 21–31)
CO2 TOTAL: 15 mmol/L — ABNORMAL LOW (ref 21–31)
CO2 TOTAL: 21 mmol/L (ref 21–31)
CO2 TOTAL: 25 mmol/L (ref 21–31)
CO2 TOTAL: 7 mmol/L — CL (ref 21–31)
CREATININE: 1.04 mg/dL (ref 0.60–1.30)
CREATININE: 1.16 mg/dL (ref 0.60–1.30)
CREATININE: 1.5 mg/dL — ABNORMAL HIGH (ref 0.60–1.30)
CREATININE: 1.72 mg/dL — ABNORMAL HIGH (ref 0.60–1.30)
CREATININE: 1.82 mg/dL — ABNORMAL HIGH (ref 0.60–1.30)
CREATININE: 1.91 mg/dL — ABNORMAL HIGH (ref 0.60–1.30)
ESTIMATED GFR: 26 mL/min/{1.73_m2} — ABNORMAL LOW (ref >59)
ESTIMATED GFR: 28 mL/min/{1.73_m2} — ABNORMAL LOW (ref >59)
ESTIMATED GFR: 30 mL/min/{1.73_m2} — ABNORMAL LOW (ref >59)
ESTIMATED GFR: 35 mL/min/{1.73_m2} — ABNORMAL LOW (ref >59)
ESTIMATED GFR: 47 mL/min/{1.73_m2} — ABNORMAL LOW (ref >59)
ESTIMATED GFR: 54 mL/min/{1.73_m2} — ABNORMAL LOW (ref >59)
GLUCOSE: 105 mg/dL (ref 74–109)
GLUCOSE: 129 mg/dL — ABNORMAL HIGH (ref 74–109)
GLUCOSE: 314 mg/dL — ABNORMAL HIGH (ref 74–109)
GLUCOSE: 394 mg/dL — ABNORMAL HIGH (ref 74–109)
GLUCOSE: 580 mg/dL (ref 74–109)
GLUCOSE: 726 mg/dL (ref 74–109)
OSMOLALITY, CALCULATED: 290 mOsm/kg (ref 270–290)
OSMOLALITY, CALCULATED: 292 mOsm/kg — ABNORMAL HIGH (ref 270–290)
OSMOLALITY, CALCULATED: 300 mOsm/kg — ABNORMAL HIGH (ref 270–290)
OSMOLALITY, CALCULATED: 300 mOsm/kg — ABNORMAL HIGH (ref 270–290)
OSMOLALITY, CALCULATED: 316 mOsm/kg — ABNORMAL HIGH (ref 270–290)
OSMOLALITY, CALCULATED: 321 mOsm/kg — ABNORMAL HIGH (ref 270–290)
POTASSIUM: 4.2 mmol/L (ref 3.5–5.1)
POTASSIUM: 4.2 mmol/L (ref 3.5–5.1)
POTASSIUM: 4.2 mmol/L (ref 3.5–5.1)
POTASSIUM: 4.3 mmol/L (ref 3.5–5.1)
POTASSIUM: 4.4 mmol/L (ref 3.5–5.1)
POTASSIUM: 4.8 mmol/L (ref 3.5–5.1)
SODIUM: 135 mmol/L — ABNORMAL LOW (ref 136–145)
SODIUM: 137 mmol/L (ref 136–145)
SODIUM: 137 mmol/L (ref 136–145)
SODIUM: 137 mmol/L (ref 136–145)
SODIUM: 138 mmol/L (ref 136–145)
SODIUM: 140 mmol/L (ref 136–145)

## 2021-12-24 LAB — MAGNESIUM: MAGNESIUM: 2.4 mg/dL (ref 1.9–2.7)

## 2021-12-24 LAB — COMPREHENSIVE METABOLIC PANEL, NON-FASTING
ALBUMIN/GLOBULIN RATIO: 1.3 (ref 0.8–1.4)
ALBUMIN: 3.6 g/dL (ref 3.5–5.7)
ALKALINE PHOSPHATASE: 89 U/L (ref 34–104)
ALT (SGPT): 8 U/L (ref 7–52)
ANION GAP: 18 mmol/L (ref 10–20)
AST (SGOT): 11 U/L — ABNORMAL LOW (ref 13–39)
BILIRUBIN TOTAL: 0.5 mg/dL (ref 0.3–1.2)
BUN/CREA RATIO: 33 — ABNORMAL HIGH (ref 6–22)
BUN: 53 mg/dL — ABNORMAL HIGH (ref 7–25)
CALCIUM, CORRECTED: 9.7 mg/dL (ref 8.9–10.8)
CALCIUM: 9.3 mg/dL (ref 8.6–10.3)
CHLORIDE: 106 mmol/L (ref 98–107)
CO2 TOTAL: 15 mmol/L — ABNORMAL LOW (ref 21–31)
CREATININE: 1.61 mg/dL — ABNORMAL HIGH (ref 0.60–1.30)
ESTIMATED GFR: 32 mL/min/{1.73_m2} — ABNORMAL LOW (ref >59)
GLOBULIN: 2.7 — ABNORMAL LOW (ref 2.9–5.4)
GLUCOSE: 196 mg/dL — ABNORMAL HIGH (ref 74–109)
OSMOLALITY, CALCULATED: 298 mOsm/kg — ABNORMAL HIGH (ref 270–290)
POTASSIUM: 4.4 mmol/L (ref 3.5–5.1)
PROTEIN TOTAL: 6.3 g/dL — ABNORMAL LOW (ref 6.4–8.9)
SODIUM: 139 mmol/L (ref 136–145)

## 2021-12-24 LAB — VENOUS BLOOD GAS/LACTATE
BASE EXCESS: -14.3 mmol/L — ABNORMAL LOW (ref <=3.0)
BASE EXCESS: -21.2 mmol/L — ABNORMAL LOW (ref <=3.0)
BICARBONATE (VENOUS): 9.7 mmol/L — ABNORMAL LOW (ref 22.0–26.0)
LACTATE: 4.2 mmol/L (ref <=4.0)
LACTATE: 4.8 mmol/L (ref <=4.0)
PCO2 (VENOUS): 32 mm/Hg — ABNORMAL LOW (ref 41–51)
PCO2 (VENOUS): 38 mm/Hg — ABNORMAL LOW (ref 41–51)
PH (VENOUS): 7.02 — CL (ref 7.31–7.41)
PH (VENOUS): 7.23 — CL (ref 7.31–7.41)
PO2 (VENOUS): 200 mm/Hg — ABNORMAL HIGH (ref 35–50)
PO2 (VENOUS): 54 mm/Hg — ABNORMAL HIGH (ref 35–50)

## 2021-12-24 LAB — CBC
HCT: 34.9 % — ABNORMAL LOW (ref 37.0–47.0)
HGB: 11.7 g/dL — ABNORMAL LOW (ref 12.5–16.0)
MCH: 31.1 pg (ref 27.0–32.0)
MCHC: 33.5 g/dL (ref 32.0–36.0)
MCV: 93 fL (ref 78.0–99.0)
MPV: 6.5 fL — ABNORMAL LOW (ref 7.4–10.4)
PLATELETS: 279 10*3/uL (ref 140–440)
RBC: 3.76 10*6/uL — ABNORMAL LOW (ref 4.20–5.40)
RDW: 14.6 % (ref 11.6–14.8)
WBC: 10.4 10*3/uL (ref 4.0–10.5)
WBCS UNCORRECTED: 10.4 10*3/uL

## 2021-12-24 LAB — POC BLOOD GLUCOSE (RESULTS)
GLUCOSE, POC: 104 mg/dl (ref 50–500)
GLUCOSE, POC: 112 mg/dl (ref 50–500)
GLUCOSE, POC: 115 mg/dl (ref 50–500)
GLUCOSE, POC: 117 mg/dl (ref 50–500)
GLUCOSE, POC: 122 mg/dl (ref 50–500)
GLUCOSE, POC: 126 mg/dl (ref 50–500)
GLUCOSE, POC: 162 mg/dl (ref 50–500)
GLUCOSE, POC: 166 mg/dl (ref 50–500)
GLUCOSE, POC: 167 mg/dl (ref 50–500)
GLUCOSE, POC: 175 mg/dl (ref 50–500)
GLUCOSE, POC: 179 mg/dl (ref 50–500)
GLUCOSE, POC: 312 mg/dl (ref 50–500)
GLUCOSE, POC: 320 mg/dl (ref 50–500)
GLUCOSE, POC: 387 mg/dl (ref 50–500)
GLUCOSE, POC: 387 mg/dl (ref 50–500)
GLUCOSE, POC: 504 mg/dl (ref 50–500)
GLUCOSE, POC: 51 mg/dl (ref 50–500)
GLUCOSE, POC: 67 mg/dl (ref 50–500)
GLUCOSE, POC: 89 mg/dl (ref 50–500)
GLUCOSE, POC: >600 mg/dl (ref 50–500)
GLUCOSE, POC: >600 mg/dl (ref 50–500)

## 2021-12-24 LAB — PHOSPHORUS: PHOSPHORUS: 3.3 mg/dL — ABNORMAL LOW (ref 3.7–7.2)

## 2021-12-24 LAB — C. DIFFICILE PCR
C. DIFFICILE TOXIN GENE, PCR: NEGATIVE
PRESUMPTIVE 027/NAP1/BI: NEGATIVE

## 2021-12-24 LAB — LACTIC ACID TIMED
LACTIC ACID: 4.2 mmol/L (ref 0.5–2.2)
LACTIC ACID: 3.7 mmol/L — ABNORMAL HIGH (ref 0.5–2.2)
LACTIC ACID: 3.6 mmol/L — ABNORMAL HIGH (ref 0.5–2.2)

## 2021-12-24 MED ORDER — DEXTROSE 5 % AND 0.9 % SODIUM CHLORIDE INTRAVENOUS SOLUTION
INTRAVENOUS | Status: DC
Start: 2021-12-24 — End: 2021-12-25
  Administered 2021-12-25: 0 mL via INTRAVENOUS

## 2021-12-24 MED ORDER — DEXTROSE 50 % IN WATER (D50W) INTRAVENOUS SYRINGE
INJECTION | INTRAVENOUS | Status: AC
Start: 2021-12-24 — End: 2021-12-24
  Administered 2021-12-24: 50 mL via INTRAVENOUS
  Filled 2021-12-24: qty 50

## 2021-12-24 MED ORDER — DEXTROSE 5 % AND 0.9 % SODIUM CHLORIDE INTRAVENOUS SOLUTION
INTRAVENOUS | Status: DC
Start: 2021-12-24 — End: 2021-12-24
  Administered 2021-12-24: 0 mL via INTRAVENOUS

## 2021-12-24 MED ORDER — ACETAMINOPHEN 325 MG TABLET
650.0000 mg | ORAL_TABLET | ORAL | Status: DC | PRN
Start: 2021-12-24 — End: 2022-01-08
  Administered 2021-12-24 – 2022-01-02 (×7): 650 mg via ORAL
  Filled 2021-12-24 (×7): qty 2

## 2021-12-24 MED ORDER — DEXTROSE 50 % IN WATER (D50W) INTRAVENOUS SYRINGE
25.0000 g | INJECTION | Freq: Once | INTRAVENOUS | Status: AC
Start: 2021-12-24 — End: 2021-12-24

## 2021-12-24 MED ORDER — HYDROXYZINE PAMOATE 25 MG CAPSULE
25.0000 mg | ORAL_CAPSULE | Freq: Four times a day (QID) | ORAL | Status: DC | PRN
Start: 2021-12-24 — End: 2021-12-24

## 2021-12-24 MED ORDER — INSULIN GLARGINE 100 UNITS/ML SUBQ - CHARGE BY DOSE
10.0000 [IU] | Freq: Every evening | SUBCUTANEOUS | Status: DC
Start: 2021-12-24 — End: 2021-12-25
  Administered 2021-12-24: 10 [IU] via SUBCUTANEOUS
  Filled 2021-12-24: qty 10

## 2021-12-24 NOTE — Progress Notes (Signed)
[facility]  IP PROGRESS NOTE      Date of Admission:  12/23/2021  Hospital Day:  LOS: 1 day       Subjective:   Patient remains confused and lethargic, arouses to my voice but does not track or converse.  Her gap open back up and she had to be placed back on the insulin drip this morning.    Vitals:  Temp (24hrs) Max:36.5 C (97.7 F)      Temperature: 36.2 C (97.1 F)  BP (Non-Invasive): (!) 115/44  MAP (Non-Invasive): (!) 59 mmHG  Heart Rate: 83  Respiratory Rate: 17  SpO2: (!) 79 %    I/O:  I/O last 24 hours:      Intake/Output Summary (Last 24 hours) at 12/24/2021 1928  Last data filed at 12/24/2021 1657  Gross per 24 hour   Intake 3120.83 ml   Output 400 ml   Net 2720.83 ml       PE:  Gen: NAD, lethargic  Skin: warm, dry  HENT: NC/AT, PERRL, normal conjunctivae, moist oral mucosa  Neck: normal circumference, no JVD, no adenopathy  Chest: NL breathing, clear BSs bilaterally  Cor: S1/S2, RRR, no murmurs  Abd: normal bowel sounds, soft, NT  GU: foley in place, clear yellow urine  Ext: no LE edema, 2+ radial pulses  Neuro: confused, not following commands      Current Meds:  acetaminophen (TYLENOL) tablet, 650 mg, Oral, Q4H PRN  alcohol 62 % (NOZIN NASAL SANITIZER) nasal solution, 1 Each, Each Nostril, 2x/day  D5W NS premix infusion, , Intravenous, Continuous  dextrose 50% (0.5 g/mL) injection - syringe, 12.5 g, Intravenous, Q1H PRN  heparin 5,000 unit/mL injection, 5,000 Units, Subcutaneous, Q8HRS  insulin regular human 100 Units in NS 100 mL infusion, 0.1 Units/kg/hr, Intravenous, Continuous  miconazole (MONISTAT) 2% vaginal cream, 1 Applicator, Topical, 2x/day  NS premix infusion, , Intravenous, Continuous  NS premix infusion, , Intravenous, Continuous        Labs:  Reviewed:   Results for orders placed or performed during the hospital encounter of 12/23/21 (from the past 24 hour(s))   C. DIFFICILE PCR    Specimen: Stool   Result Value Ref Range    C. DIFFICILE TOXIN GENE, PCR Negative  Negative    PRESUMPTIVE 027/NAP1/BI Negative Not Detected, Negative   CBC/DIFF    Narrative    The following orders were created for panel order CBC/DIFF.  Procedure                               Abnormality         Status                     ---------                               -----------         ------                     CBC WITH DIFF[[id number]]                Abnormal            Final result                 Please view results for these tests on the individual orders.  COMPREHENSIVE METABOLIC PANEL, NON-FASTING   Result Value Ref Range    SODIUM 129 (L) 136 - 145 mmol/L    POTASSIUM 6.2 (HH) 3.5 - 5.1 mmol/L    CHLORIDE 86 (L) 98 - 107 mmol/L    CO2 TOTAL 6 (LL) 21 - 31 mmol/L    ANION GAP 37 (H) 10 - 20 mmol/L    BUN 54 (H) 7 - 25 mg/dL    CREATININE 1.66 (H) 0.60 - 1.30 mg/dL    BUN/CREA RATIO 33 (H) 6 - 22    ESTIMATED GFR 31 (L) >59 mL/min/1.43m2    ALBUMIN 4.0 3.5 - 5.7 g/dL    CALCIUM 9.7 8.6 - 10.3 mg/dL    GLUCOSE 886 (HH) 74 - 109 mg/dL    ALKALINE PHOSPHATASE 118 (H) 34 - 104 U/L    ALT (SGPT) 11 7 - 52 U/L    AST (SGOT) 11 (L) 13 - 39 U/L    BILIRUBIN TOTAL 0.5 0.3 - 1.2 mg/dL    PROTEIN TOTAL 7.1 6.4 - 8.9 g/dL    ALBUMIN/GLOBULIN RATIO 1.3 0.8 - 1.4    OSMOLALITY, CALCULATED 318 (H) 270 - 290 mOsm/kg    CALCIUM, CORRECTED 9.7 8.9 - 10.8 mg/dL    GLOBULIN 3.1 2.9 - 5.4    Narrative    Estimated Glomerular Filtration Rate (eGFR) is calculated using the CKD-EPI (2021) equation, intended for patients 168years of age and older. If gender is not documented or "unknown", there will be no eGFR calculation.   PT/INR   Result Value Ref Range    PROTHROMBIN TIME 12.7 9.8 - 12.7 seconds    INR 1.10 <=5.00    Narrative    INR OF 2.0-3.0  RECOMMENDED FOR: PROPHYLAXIS/TREATMENT OF VENEOUS THROMBOSIS, PULMONARY EMBOLISM, PREVENTION OF SYSTEMIC EMBOLISM FROM ATRIAL FIBRILATION, MYOCARDIAL INFARCTION.    INR OF 2.5-3.5  RECOMMENDED FOR MECHANICAL PROSTHETIC HEART VALVES, RECURRENT SYSTEMIC EMBOLISM, RECURRENT  MYOCARDIAL INFARCTION.     PTT (PARTIAL THROMBOPLASTIN TIME)   Result Value Ref Range    APTT 28.1 26.0 - 36.0 seconds   TROPONIN-I   Result Value Ref Range    TROPONIN I 18 (H) <15 ng/L   URINALYSIS, MACROSCOPIC AND MICROSCOPIC W/CULTURE REFLEX    Specimen: Urine, Site not specified    Narrative    The following orders were created for panel order URINALYSIS, MACROSCOPIC AND MICROSCOPIC W/CULTURE REFLEX.  Procedure                               Abnormality         Status                     ---------                               -----------         ------                     URINALYSIS, MACROSCOPIC[[id number]]      Abnormal            Final result               URINALYSIS, MICROSCOPIC[[id number]]      Abnormal            Final result  Please view results for these tests on the individual orders.   CBC WITH DIFF   Result Value Ref Range    WBCS UNCORRECTED 11.0 x10^3/uL    WBC 11.0 (H) 4.0 - 10.5 x10^3/uL    RBC 3.99 (L) 4.20 - 5.40 x10^6/uL    HGB 12.2 (L) 12.5 - 16.0 g/dL    HCT 40.4 37.0 - 47.0 %    MCV 101.1 (H) 78.0 - 99.0 fL    MCH 30.4 27.0 - 32.0 pg    MCHC 30.1 (L) 32.0 - 36.0 g/dL    RDW 15.2 (H) 11.6 - 14.8 %    PLATELETS 326 140 - 440 x10^3/uL    MPV 7.0 (L) 7.4 - 10.4 fL    NEUTROPHIL % 96 (H) 40 - 76 %    LYMPHOCYTE % 2 (L) 25 - 45 %    MONOCYTE % 2 0 - 12 %    EOSINOPHIL % 0 0 - 7 %    BASOPHIL % 0 0 - 3 %    NEUTROPHIL # 10.60 (H) 1.80 - 8.40 x10^3/uL    LYMPHOCYTE # 0.20 (L) 1.10 - 5.00 x10^3/uL    MONOCYTE # 0.20 0.00 - 1.30 x10^3/uL    EOSINOPHIL # 0.00 0.00 - 0.80 x10^3/uL    BASOPHIL # 0.00 0.00 - 0.30 x10^3/uL    RBC COMMENT slight aniso     PLATELET COMMENT plt appear normal    URINALYSIS, MACROSCOPIC   Result Value Ref Range    COLOR Red (A) Colorless, Light Yellow, Yellow    APPEARANCE Clear Clear    SPECIFIC GRAVITY 1.020 1.002 - 1.030    PH 5.0 5.0 - 9.0    LEUKOCYTES Negative Negative, 100  WBCs/uL    NITRITE Negative Negative    PROTEIN Negative Negative, 10 , 20  mg/dL     GLUCOSE >1000 (A) Negative, 30  mg/dL    KETONES 100 (A) Negative, Trace mg/dL    BILIRUBIN Negative Negative, 0.5 mg/dL    BLOOD Small (A) Negative, Trace mg/dL    UROBILINOGEN Normal Normal mg/dL    Narrative    Substances that cause abnormal urine color may affect the readability of the urinalysis reagent strips. The color development on the reagant pad may be masked and could be interpreted as a false positive.     URINALYSIS, MICROSCOPIC   Result Value Ref Range    MUCOUS Rare (A) (none) /hpf    BUDDING YEAST Rare (A) (none) /hpf    RBCS <1 <4 /hpf    WBCS 2 <6 /hpf   VENOUS BLOOD GAS/LACTATE   Result Value Ref Range    PH (VENOUS) 7.04 (LL) 7.31 - 7.41    PCO2 (VENOUS) 25 (L) 41 - 51 mm/Hg    PO2 (VENOUS) 74 (H) 35 - 50 mm/Hg    BICARBONATE (VENOUS) 8.8 (L) 22.0 - 26.0 mmol/L    BASE EXCESS -23.9 (L) -3.0 - 3.0 mmol/L    LACTATE 3.0 <=4.0 mmol/L   LACTIC ACID TIMED   Result Value Ref Range    LACTIC ACID 2.5 (H) 0.5 - 2.2 mmol/L   BETA-HYDROXYBUTYRATE, PLASMA OR SERUM   Result Value Ref Range    BETA-HYDROXYBUTYRATE >8.00 (H) 0.02 - 0.29 mmol/L   MAGNESIUM - STAT   Result Value Ref Range    MAGNESIUM 2.5 1.9 - 2.7 mg/dL   PHOSPHORUS - STAT   Result Value Ref Range    PHOSPHORUS 7.4 (H) 3.7 - 7.2 mg/dL  BASIC METABOLIC PANEL   Result Value Ref Range    SODIUM 132 (L) 136 - 145 mmol/L    POTASSIUM 5.7 (H) 3.5 - 5.1 mmol/L    CHLORIDE 91 (L) 98 - 107 mmol/L    CO2 TOTAL 6 (LL) 21 - 31 mmol/L    ANION GAP 35 (H) 10 - 20 mmol/L    CALCIUM 9.2 8.6 - 10.3 mg/dL    GLUCOSE 840 (HH) 74 - 109 mg/dL    BUN 54 (H) 7 - 25 mg/dL    CREATININE 1.72 (H) 0.60 - 1.30 mg/dL    BUN/CREA RATIO 31 (H) 6 - 22    ESTIMATED GFR 30 (L) >59 mL/min/1.63m2    OSMOLALITY, CALCULATED 321 (H) 270 - 290 mOsm/kg    Narrative    Estimated Glomerular Filtration Rate (eGFR) is calculated using the CKD-EPI (2021) equation, intended for patients 142years of age and older. If gender is not documented or "unknown", there will be no eGFR  calculation.   BASIC METABOLIC PANEL   Result Value Ref Range    SODIUM 135 (L) 136 - 145 mmol/L    POTASSIUM 4.3 3.5 - 5.1 mmol/L    CHLORIDE 94 (L) 98 - 107 mmol/L    CO2 TOTAL 7 (LL) 21 - 31 mmol/L    ANION GAP 34 (H) 10 - 20 mmol/L    CALCIUM 9.2 8.6 - 10.3 mg/dL    GLUCOSE 726 (HH) 74 - 109 mg/dL    BUN 55 (H) 7 - 25 mg/dL    CREATININE 1.82 (H) 0.60 - 1.30 mg/dL    BUN/CREA RATIO 30 (H) 6 - 22    ESTIMATED GFR 28 (L) >59 mL/min/1.754m    OSMOLALITY, CALCULATED 321 (H) 270 - 290 mOsm/kg    Narrative    Estimated Glomerular Filtration Rate (eGFR) is calculated using the CKD-EPI (2021) equation, intended for patients 1832ears of age and older. If gender is not documented or "unknown", there will be no eGFR calculation.   VENOUS BLOOD GAS/LACTATE   Result Value Ref Range    PH (VENOUS) 7.01 (LL) 7.31 - 7.41    PCO2 (VENOUS) 30 (L) 41 - 51 mm/Hg    PO2 (VENOUS) 55 (H) 35 - 50 mm/Hg    BICARBONATE (VENOUS) 8.6 (L) 22.0 - 26.0 mmol/L    BASE EXCESS -23.4 (L) -3.0 - 3.0 mmol/L    LACTATE 3.4 <=4.0 mmol/L   BASIC METABOLIC PANEL   Result Value Ref Range    SODIUM 137 136 - 145 mmol/L    POTASSIUM 4.2 3.5 - 5.1 mmol/L    CHLORIDE 97 (L) 98 - 107 mmol/L    CO2 TOTAL 10 (L) 21 - 31 mmol/L    ANION GAP 30 (H) 10 - 20 mmol/L    CALCIUM 9.3 8.6 - 10.3 mg/dL    GLUCOSE 580 (HH) 74 - 109 mg/dL    BUN 56 (H) 7 - 25 mg/dL    CREATININE 1.91 (H) 0.60 - 1.30 mg/dL    BUN/CREA RATIO 29 (H) 6 - 22    ESTIMATED GFR 26 (L) >59 mL/min/1.7369m   OSMOLALITY, CALCULATED 316 (H) 270 - 290 mOsm/kg    Narrative    Estimated Glomerular Filtration Rate (eGFR) is calculated using the CKD-EPI (2021) equation, intended for patients 18 43ars of age and older. If gender is not documented or "unknown", there will be no eGFR calculation.   VENOUS BLOOD GAS/LACTATE   Result Value Ref Range  PH (VENOUS) 7.02 (LL) 7.31 - 7.41    PCO2 (VENOUS) 38 (L) 41 - 51 mm/Hg    PO2 (VENOUS) 54 (H) 35 - 50 mm/Hg    BICARBONATE (VENOUS) 9.7 (L) 22.0 - 26.0 mmol/L     BASE EXCESS -21.2 (L) -3.0 - 3.0 mmol/L    LACTATE 4.8 (HH) <=4.0 mmol/L   BASIC METABOLIC PANEL   Result Value Ref Range    SODIUM 137 136 - 145 mmol/L    POTASSIUM 4.2 3.5 - 5.1 mmol/L    CHLORIDE 104 98 - 107 mmol/L    CO2 TOTAL 12 (L) 21 - 31 mmol/L    ANION GAP 21 (H) 10 - 20 mmol/L    CALCIUM 9.3 8.6 - 10.3 mg/dL    GLUCOSE 314 (H) 74 - 109 mg/dL    BUN 53 (H) 7 - 25 mg/dL    CREATININE 1.72 (H) 0.60 - 1.30 mg/dL    BUN/CREA RATIO 31 (H) 6 - 22    ESTIMATED GFR 30 (L) >59 mL/min/1.77m2    OSMOLALITY, CALCULATED 300 (H) 270 - 290 mOsm/kg    Narrative    Estimated Glomerular Filtration Rate (eGFR) is calculated using the CKD-EPI (2021) equation, intended for patients 121years of age and older. If gender is not documented or "unknown", there will be no eGFR calculation.   VENOUS BLOOD GAS/LACTATE   Result Value Ref Range    PH (VENOUS) 7.23 (LL) 7.31 - 7.41    PCO2 (VENOUS) 32 (L) 41 - 51 mm/Hg    PO2 (VENOUS) 200 (H) 35 - 50 mm/Hg    BICARBONATE (VENOUS)      BASE EXCESS -14.3 (L) -3.0 - 3.0 mmol/L    LACTATE 4.2 (HH) <=4.0 mmol/L   PHOSPHORUS - TO BE DRAWN 7 HOURS AFTER INITIATION OF INSULIN INFUSION   Result Value Ref Range    PHOSPHORUS 3.3 (L) 3.7 - 7.2 mg/dL   CBC   Result Value Ref Range    WBCS UNCORRECTED 10.4 x10^3/uL    WBC 10.4 4.0 - 10.5 x10^3/uL    RBC 3.76 (L) 4.20 - 5.40 x10^6/uL    HGB 11.7 (L) 12.5 - 16.0 g/dL    HCT 34.9 (L) 37.0 - 47.0 %    MCV 93.0 78.0 - 99.0 fL    MCH 31.1 27.0 - 32.0 pg    MCHC 33.5 32.0 - 36.0 g/dL    RDW 14.6 11.6 - 14.8 %    PLATELETS 279 140 - 440 x10^3/uL    MPV 6.5 (L) 7.4 - 10.4 fL   COMPREHENSIVE METABOLIC PANEL, NON-FASTING   Result Value Ref Range    SODIUM 139 136 - 145 mmol/L    POTASSIUM 4.4 3.5 - 5.1 mmol/L    CHLORIDE 106 98 - 107 mmol/L    CO2 TOTAL 15 (L) 21 - 31 mmol/L    ANION GAP 18 10 - 20 mmol/L    BUN 53 (H) 7 - 25 mg/dL    CREATININE 1.61 (H) 0.60 - 1.30 mg/dL    BUN/CREA RATIO 33 (H) 6 - 22    ESTIMATED GFR 32 (L) >59 mL/min/1.767m    ALBUMIN  3.6 3.5 - 5.7 g/dL    CALCIUM 9.3 8.6 - 10.3 mg/dL    GLUCOSE 196 (H) 74 - 109 mg/dL    ALKALINE PHOSPHATASE 89 34 - 104 U/L    ALT (SGPT) 8 7 - 52 U/L    AST (SGOT) 11 (L) 13 - 39 U/L  BILIRUBIN TOTAL 0.5 0.3 - 1.2 mg/dL    PROTEIN TOTAL 6.3 (L) 6.4 - 8.9 g/dL    ALBUMIN/GLOBULIN RATIO 1.3 0.8 - 1.4    OSMOLALITY, CALCULATED 298 (H) 270 - 290 mOsm/kg    CALCIUM, CORRECTED 9.7 8.9 - 10.8 mg/dL    GLOBULIN 2.7 (L) 2.9 - 5.4    Narrative    Estimated Glomerular Filtration Rate (eGFR) is calculated using the CKD-EPI (2021) equation, intended for patients 60 years of age and older. If gender is not documented or "unknown", there will be no eGFR calculation.   MAGNESIUM   Result Value Ref Range    MAGNESIUM 2.4 1.9 - 2.7 mg/dL   LACTIC ACID TIMED   Result Value Ref Range    LACTIC ACID 4.2 (HH) 0.5 - 2.2 mmol/L   LACTIC ACID TIMED   Result Value Ref Range    LACTIC ACID 3.7 (H) 0.5 - 2.2 mmol/L   LACTIC ACID TIMED   Result Value Ref Range    LACTIC ACID 3.6 (H) 0.5 - 2.2 mmol/L   BASIC METABOLIC PANEL   Result Value Ref Range    SODIUM 138 136 - 145 mmol/L    POTASSIUM 4.8 3.5 - 5.1 mmol/L    CHLORIDE 108 (H) 98 - 107 mmol/L    CO2 TOTAL 15 (L) 21 - 31 mmol/L    ANION GAP 15 10 - 20 mmol/L    CALCIUM 9.1 8.6 - 10.3 mg/dL    GLUCOSE 105 74 - 109 mg/dL    BUN 51 (H) 7 - 25 mg/dL    CREATININE 1.50 (H) 0.60 - 1.30 mg/dL    BUN/CREA RATIO 34 (H) 6 - 22    ESTIMATED GFR 35 (L) >59 mL/min/1.64m2    OSMOLALITY, CALCULATED 290 270 - 290 mOsm/kg    Narrative    Estimated Glomerular Filtration Rate (eGFR) is calculated using the CKD-EPI (2021) equation, intended for patients 175years of age and older. If gender is not documented or "unknown", there will be no eGFR calculation.   POC BLOOD GLUCOSE (RESULTS)   Result Value Ref Range    GLUCOSE, POC >600 (HH) 50 - 500 mg/dl   POC BLOOD GLUCOSE (RESULTS)   Result Value Ref Range    GLUCOSE, POC >600 (HH) 50 - 500 mg/dl   POC BLOOD GLUCOSE (RESULTS)   Result Value Ref Range     GLUCOSE, POC >600 (HH) 50 - 500 mg/dl   POC BLOOD GLUCOSE (RESULTS)   Result Value Ref Range    GLUCOSE, POC >600 (HH) 50 - 500 mg/dl   POC BLOOD GLUCOSE (RESULTS)   Result Value Ref Range    GLUCOSE, POC >600 (HH) 50 - 500 mg/dl   POC BLOOD GLUCOSE (RESULTS)   Result Value Ref Range    GLUCOSE, POC 504 (HH) 50 - 500 mg/dl   POC BLOOD GLUCOSE (RESULTS)   Result Value Ref Range    GLUCOSE, POC 387 50 - 500 mg/dl   POC BLOOD GLUCOSE (RESULTS)   Result Value Ref Range    GLUCOSE, POC 320 50 - 500 mg/dl   POC BLOOD GLUCOSE (RESULTS)   Result Value Ref Range    GLUCOSE, POC 312 50 - 500 mg/dl   POC BLOOD GLUCOSE (RESULTS)   Result Value Ref Range    GLUCOSE, POC 179 50 - 500 mg/dl   POC BLOOD GLUCOSE (RESULTS)   Result Value Ref Range    GLUCOSE, POC 117 50 - 500 mg/dl  POC BLOOD GLUCOSE (RESULTS)   Result Value Ref Range    GLUCOSE, POC 122 50 - 500 mg/dl   POC BLOOD GLUCOSE (RESULTS)   Result Value Ref Range    GLUCOSE, POC 89 50 - 500 mg/dl   POC BLOOD GLUCOSE (RESULTS)   Result Value Ref Range    GLUCOSE, POC 104 50 - 500 mg/dl   POC BLOOD GLUCOSE (RESULTS)   Result Value Ref Range    GLUCOSE, POC 112 50 - 500 mg/dl   POC BLOOD GLUCOSE (RESULTS)   Result Value Ref Range    GLUCOSE, POC 166 50 - 500 mg/dl   POC BLOOD GLUCOSE (RESULTS)   Result Value Ref Range    GLUCOSE, POC 162 50 - 500 mg/dl   POC BLOOD GLUCOSE (RESULTS)   Result Value Ref Range    GLUCOSE, POC 175 50 - 500 mg/dl   POC BLOOD GLUCOSE (RESULTS)   Result Value Ref Range    GLUCOSE, POC 126 50 - 500 mg/dl   POC BLOOD GLUCOSE (RESULTS)   Result Value Ref Range    GLUCOSE, POC 115 50 - 500 mg/dl          Radiology:  Reviewed:   Results for orders placed or performed during the hospital encounter of 12/23/21 (from the past 24 hour(s))   CT BRAIN WO IV CONTRAST     Status: None    Narrative    Sowmya PEARL Trent    RADIOLOGIST: Ileene Hutchinson    CT BRAIN WO IV CONTRAST performed on 12/23/2021 7:59 PM    CLINICAL HISTORY: Suspected Stroke.  STROKE ALERT  CONFUSION    TECHNIQUE:  Head CT without intravenous contrast.    COMPARISON: CT brain dated 12/17/2021  # of known CTs in the past 12 months: 12   # of known Cardiac Nuclear Medicine Studies in the past 12 months: 0    FINDINGS:  There is no acute intracranial hemorrhage, mass effect, or evidence of large acute infarct.    Brain: Low density in the periventricular white matter suggests mild chronic small vessel ischemic changes.    CSF Spaces: Mild generalized cerebral atrophy     Sinuses/Mastoids:  Clear at visualized levels     Bones: Unremarkable        Impression    CHRONIC CHANGES.  NO ACUTE FINDINGS.       One or more dose reduction techniques were used (e.g., Automated exposure control, adjustment of the mA and/or kV according to patient size, use of iterative reconstruction technique).      Radiologist location ID: [id number]     XR AP MOBILE CHEST     Status: None    Narrative    Crysta PEARL Culotta    RADIOLOGIST: Ileene Hutchinson    XR AP MOBILE CHEST performed on 12/23/2021 10:15 PM    CLINICAL HISTORY: altered loc.  ams    TECHNIQUE: Frontal view of the chest.    COMPARISON:  Chest radiograph dated 12/16/2021    FINDINGS:    The heart size is normal.   There are chronic-appearing changes of both lungs.           Impression    NO ACUTE FINDINGS.      Radiologist location ID: [id number]             A/P:  1. DKA  -Gap reopened, back on insulin gtt, trend BMPs q 4h    2. AKI  -Cr 1.5; monitor for recovery  3. Acute encephalopathy  -In setting of DKA, still not resolved    4. DM2  -A1c 5/15: 9.6%    Heparin SQ    Full code

## 2021-12-24 NOTE — Nurses Notes (Signed)
PT ARRIVED TO CCU AT THIS TIME ON STRETCHER VIA ER RN AND ERT. PT RECEIVING O2 VIA NC AT 3L. INSULIN DRIP INFUSING AT 15 UNITS/HR. PT TRANSFERRED OVER TO UNIT BED BY RN X2 AND PCT X2. PT APPEARS CONFUSED. PT GIVEN CALL BELL AND SAFETY/ FALL PREVENTION MEASURES IMPLEMENTED.

## 2021-12-24 NOTE — Care Plan (Signed)
Problem: Adult Inpatient Plan of Care  Goal: Rounds/Family Conference  Flowsheets (Taken 12/24/2021 1030)  Participants:   advanced practice nurse   case manager   patient   physician   dietitian/nutrition services  Note: Continue insulin gtt. BMPs Q4H. Patient is NPO.

## 2021-12-24 NOTE — Care Plan (Signed)
PT ADMITTED TO CCU 7 WITH DX OF DKA. INSULIN DRIP INFUSING PER PROTOCOL, SEE EMAR. Q1 HR ACCU CHECKS IN PLACE. IV MAINTENANCE FLUIDS ALSO INFUSING. PT ON 3L NC TO MAINTAIN O2 SATURATION >93%. PT DAUGHTER BRINGING IN ADVANCED DIRECTIVE FORMS, AS WELL AS HOME MEDS TODAY. TRANSITION READINESS IS ONGOING.

## 2021-12-24 NOTE — Nurses Notes (Signed)
Pt accu check is 67. Pt has insulin drip at 1 unit/hr and D5NS at 125cc/hr. Stopped insulin and notified Angie Hutchinson NP in unit. Awaiting BMP results, cont D%NS at 125cc/hr. Will follow up with results.

## 2021-12-24 NOTE — Care Plan (Signed)
Nutrition  Risk - wt loss    Pt confused this morning.  NPO.  Insulin drip just discontinued.    Plan is to advance diet as labs improve per physician.    Gluc 196  Dx: DKA      RD to f/u

## 2021-12-25 ENCOUNTER — Other Ambulatory Visit: Payer: Self-pay

## 2021-12-25 ENCOUNTER — Inpatient Hospital Stay (HOSPITAL_COMMUNITY): Payer: Medicare Other

## 2021-12-25 DIAGNOSIS — R0902 Hypoxemia: Secondary | ICD-10-CM

## 2021-12-25 DIAGNOSIS — R4182 Altered mental status, unspecified: Secondary | ICD-10-CM

## 2021-12-25 DIAGNOSIS — R4781 Slurred speech: Secondary | ICD-10-CM

## 2021-12-25 DIAGNOSIS — Z794 Long term (current) use of insulin: Secondary | ICD-10-CM

## 2021-12-25 LAB — BASIC METABOLIC PANEL
ANION GAP: 11 mmol/L (ref 10–20)
ANION GAP: 5 mmol/L — ABNORMAL LOW (ref 10–20)
ANION GAP: 12 mmol/L (ref 10–20)
ANION GAP: 14 mmol/L (ref 10–20)
BUN/CREA RATIO: 32 — ABNORMAL HIGH (ref 6–22)
BUN/CREA RATIO: 36 — ABNORMAL HIGH (ref 6–22)
BUN/CREA RATIO: 39 — ABNORMAL HIGH (ref 6–22)
BUN/CREA RATIO: 35 — ABNORMAL HIGH (ref 6–22)
BUN: 37 mg/dL — ABNORMAL HIGH (ref 7–25)
BUN: 34 mg/dL — ABNORMAL HIGH (ref 7–25)
BUN: 33 mg/dL — ABNORMAL HIGH (ref 7–25)
BUN: 29 mg/dL — ABNORMAL HIGH (ref 7–25)
CALCIUM: 8.7 mg/dL (ref 8.6–10.3)
CALCIUM: 9.1 mg/dL (ref 8.6–10.3)
CALCIUM: 8.8 mg/dL (ref 8.6–10.3)
CALCIUM: 8.7 mg/dL (ref 8.6–10.3)
CHLORIDE: 109 mmol/L — ABNORMAL HIGH (ref 98–107)
CHLORIDE: 111 mmol/L — ABNORMAL HIGH (ref 98–107)
CHLORIDE: 107 mmol/L (ref 98–107)
CHLORIDE: 105 mmol/L (ref 98–107)
CO2 TOTAL: 17 mmol/L — ABNORMAL LOW (ref 21–31)
CO2 TOTAL: 24 mmol/L (ref 21–31)
CO2 TOTAL: 15 mmol/L — ABNORMAL LOW (ref 21–31)
CO2 TOTAL: 13 mmol/L — ABNORMAL LOW (ref 21–31)
CREATININE: 1.15 mg/dL (ref 0.60–1.30)
CREATININE: 0.94 mg/dL (ref 0.60–1.30)
CREATININE: 0.85 mg/dL (ref 0.60–1.30)
CREATININE: 0.82 mg/dL (ref 0.60–1.30)
ESTIMATED GFR: 48 mL/min/{1.73_m2} — ABNORMAL LOW (ref >59)
ESTIMATED GFR: 61 mL/min/{1.73_m2} (ref >59)
ESTIMATED GFR: 69 mL/min/{1.73_m2} (ref >59)
ESTIMATED GFR: 72 mL/min/{1.73_m2} (ref >59)
GLUCOSE: 329 mg/dL — ABNORMAL HIGH (ref 74–109)
GLUCOSE: 67 mg/dL — ABNORMAL LOW (ref 74–109)
GLUCOSE: 226 mg/dL — ABNORMAL HIGH (ref 74–109)
GLUCOSE: 95 mg/dL (ref 74–109)
OSMOLALITY, CALCULATED: 296 mOsm/kg — ABNORMAL HIGH (ref 270–290)
OSMOLALITY, CALCULATED: 285 mOsm/kg (ref 270–290)
OSMOLALITY, CALCULATED: 283 mOsm/kg (ref 270–290)
OSMOLALITY, CALCULATED: 270 mOsm/kg (ref 270–290)
POTASSIUM: 3.8 mmol/L (ref 3.5–5.1)
POTASSIUM: 3.3 mmol/L — ABNORMAL LOW (ref 3.5–5.1)
POTASSIUM: 4.4 mmol/L (ref 3.5–5.1)
POTASSIUM: 4.5 mmol/L (ref 3.5–5.1)
SODIUM: 137 mmol/L (ref 136–145)
SODIUM: 140 mmol/L (ref 136–145)
SODIUM: 134 mmol/L — ABNORMAL LOW (ref 136–145)
SODIUM: 132 mmol/L — ABNORMAL LOW (ref 136–145)

## 2021-12-25 LAB — COMPREHENSIVE METABOLIC PANEL, NON-FASTING
ALBUMIN/GLOBULIN RATIO: 1.3 (ref 0.8–1.4)
ALBUMIN: 3.1 g/dL — ABNORMAL LOW (ref 3.5–5.7)
ALKALINE PHOSPHATASE: 69 U/L (ref 34–104)
ALT (SGPT): 8 U/L (ref 7–52)
ANION GAP: 5 mmol/L — ABNORMAL LOW (ref 10–20)
AST (SGOT): 12 U/L — ABNORMAL LOW (ref 13–39)
BILIRUBIN TOTAL: 0.3 mg/dL (ref 0.3–1.2)
BUN/CREA RATIO: 36 — ABNORMAL HIGH (ref 6–22)
BUN: 34 mg/dL — ABNORMAL HIGH (ref 7–25)
CALCIUM, CORRECTED: 10 mg/dL (ref 8.9–10.8)
CALCIUM: 9.1 mg/dL (ref 8.6–10.3)
CHLORIDE: 111 mmol/L — ABNORMAL HIGH (ref 98–107)
CO2 TOTAL: 24 mmol/L (ref 21–31)
CREATININE: 0.94 mg/dL (ref 0.60–1.30)
ESTIMATED GFR: 61 mL/min/{1.73_m2} (ref >59)
GLOBULIN: 2.3 — ABNORMAL LOW (ref 2.9–5.4)
GLUCOSE: 67 mg/dL — ABNORMAL LOW (ref 74–109)
OSMOLALITY, CALCULATED: 285 mOsm/kg (ref 270–290)
POTASSIUM: 3.3 mmol/L — ABNORMAL LOW (ref 3.5–5.1)
PROTEIN TOTAL: 5.4 g/dL — ABNORMAL LOW (ref 6.4–8.9)
SODIUM: 140 mmol/L (ref 136–145)

## 2021-12-25 LAB — CBC
HCT: 30.2 % — ABNORMAL LOW (ref 37.0–47.0)
HGB: 10.3 g/dL — ABNORMAL LOW (ref 12.5–16.0)
MCH: 30.9 pg (ref 27.0–32.0)
MCHC: 34.1 g/dL (ref 32.0–36.0)
MCV: 90.4 fL (ref 78.0–99.0)
MPV: 6.7 fL — ABNORMAL LOW (ref 7.4–10.4)
PLATELETS: 249 10*3/uL (ref 140–440)
RBC: 3.34 10*6/uL — ABNORMAL LOW (ref 4.20–5.40)
RDW: 14.3 % (ref 11.6–14.8)
WBC: 5.7 10*3/uL (ref 4.0–10.5)
WBCS UNCORRECTED: 5.7 10*3/uL

## 2021-12-25 LAB — POC BLOOD GLUCOSE (RESULTS)
GLUCOSE, POC: 12 mg/dl — CL (ref 50–500)
GLUCOSE, POC: 21 mg/dl — CL (ref 50–500)
GLUCOSE, POC: 114 mg/dl (ref 50–500)
GLUCOSE, POC: 310 mg/dl (ref 50–500)
GLUCOSE, POC: 334 mg/dl (ref 50–500)
GLUCOSE, POC: 133 mg/dl (ref 50–500)
GLUCOSE, POC: 63 mg/dl (ref 50–500)
GLUCOSE, POC: 60 mg/dl (ref 50–500)
GLUCOSE, POC: 79 mg/dl (ref 50–500)
GLUCOSE, POC: 70 mg/dl (ref 50–500)

## 2021-12-25 LAB — ECG 12 LEAD
Atrial Rate: 117 {beats}/min
Calculated R Axis: 91 degrees
QT Interval: 322 ms
Ventricular rate: 117 {beats}/min

## 2021-12-25 LAB — MAGNESIUM: MAGNESIUM: 2.5 mg/dL (ref 1.9–2.7)

## 2021-12-25 MED ORDER — SODIUM CHLORIDE 0.9 % INTRAVENOUS SOLUTION
4.0000 [IU]/h | INTRAVENOUS | Status: DC
Start: 2021-12-25 — End: 2021-12-25
  Administered 2021-12-25: 0 [IU]/h via INTRAVENOUS
  Filled 2021-12-25: qty 100

## 2021-12-25 MED ORDER — GLUCAGON 1 MG/ML SOLUTION FOR INJECTION
1.0000 mg | INTRAMUSCULAR | Status: DC | PRN
Start: 2021-12-25 — End: 2022-01-08

## 2021-12-25 MED ORDER — LACTATED RINGERS INTRAVENOUS SOLUTION
INTRAVENOUS | Status: DC
Start: 2021-12-25 — End: 2021-12-25
  Administered 2021-12-25: 0 mL via INTRAVENOUS

## 2021-12-25 MED ORDER — SODIUM CHLORIDE 0.9 % INTRAVENOUS SOLUTION
2.0000 [IU]/h | INTRAVENOUS | Status: DC
Start: 2021-12-25 — End: 2021-12-26
  Administered 2021-12-25: 2 [IU]/h via INTRAVENOUS
  Administered 2021-12-26: 1 [IU]/h via INTRAVENOUS
  Administered 2021-12-26: 0 [IU]/h via INTRAVENOUS
  Filled 2021-12-25 (×2): qty 100

## 2021-12-25 MED ORDER — INSULIN REGULAR HUMAN 100 UNIT/ML INJECTION - CHARGE BY DOSE
6.0000 [IU] | Freq: Three times a day (TID) | INTRAMUSCULAR | Status: DC
Start: 2021-12-26 — End: 2021-12-27
  Administered 2021-12-26 (×2): 6 [IU] via SUBCUTANEOUS
  Administered 2021-12-26: 4 [IU] via SUBCUTANEOUS
  Administered 2021-12-27: 0 [IU] via SUBCUTANEOUS
  Filled 2021-12-25 (×2): qty 18
  Filled 2021-12-25: qty 12

## 2021-12-25 MED ORDER — DEXTROSE 50 % IN WATER (D50W) INTRAVENOUS SYRINGE
25.0000 g | INJECTION | Freq: Once | INTRAVENOUS | Status: AC
Start: 2021-12-25 — End: 2021-12-25
  Administered 2021-12-25: 50 mL via INTRAVENOUS

## 2021-12-25 MED ORDER — INSULIN REGULAR HUMAN 100 UNIT/ML INJECTION SSIP
0.0000 [IU] | INJECTION | Freq: Four times a day (QID) | SUBCUTANEOUS | Status: DC | PRN
Start: 2021-12-25 — End: 2021-12-25
  Administered 2021-12-25 (×3): 9 [IU] via SUBCUTANEOUS
  Filled 2021-12-25 (×3): qty 27

## 2021-12-25 MED ORDER — INSULIN REGULAR HUMAN 100 UNIT/ML INJECTION SSIP
0.0000 [IU] | INJECTION | SUBCUTANEOUS | Status: DC | PRN
Start: 2021-12-25 — End: 2021-12-29
  Administered 2021-12-28: 9 [IU] via SUBCUTANEOUS
  Administered 2021-12-28: 6 [IU] via SUBCUTANEOUS
  Administered 2021-12-29: 9 [IU] via SUBCUTANEOUS
  Filled 2021-12-25: qty 27
  Filled 2021-12-25: qty 18
  Filled 2021-12-25: qty 27

## 2021-12-25 MED ORDER — DEXTROSE 50 % IN WATER (D50W) INTRAVENOUS SYRINGE
12.5000 g | INJECTION | INTRAVENOUS | Status: DC | PRN
Start: 2021-12-25 — End: 2022-01-08
  Administered 2021-12-26: 25 mL via INTRAVENOUS
  Administered 2021-12-26: 18 mL via INTRAVENOUS
  Filled 2021-12-25 (×4): qty 50

## 2021-12-25 MED ORDER — INSULIN NPH ISOPHANE U-100 HUMAN 100 UNIT/ML SUBCUTANEOUS SUSP - CHARGE BY DOSE
8.0000 [IU] | Freq: Two times a day (BID) | SUBCUTANEOUS | Status: DC
Start: 2021-12-25 — End: 2021-12-26
  Administered 2021-12-25: 8 [IU] via SUBCUTANEOUS
  Filled 2021-12-25: qty 8

## 2021-12-25 MED ORDER — HYDROCODONE 5 MG-ACETAMINOPHEN 325 MG TABLET
1.0000 | ORAL_TABLET | Freq: Four times a day (QID) | ORAL | Status: DC | PRN
Start: 2021-12-25 — End: 2022-01-08
  Administered 2021-12-25 – 2022-01-08 (×15): 1 via ORAL
  Filled 2021-12-25 (×14): qty 1

## 2021-12-25 MED ORDER — DEXTROSE 50 % IN WATER (D50W) INTRAVENOUS SYRINGE
25.0000 g | INJECTION | INTRAVENOUS | Status: DC | PRN
Start: 2021-12-25 — End: 2022-01-08
  Administered 2021-12-25 – 2022-01-04 (×6): 50 mL via INTRAVENOUS
  Filled 2021-12-25 (×8): qty 50

## 2021-12-25 MED ORDER — SODIUM CHLORIDE 0.45 % INTRAVENOUS SOLUTION
INTRAVENOUS | Status: DC
Start: 2021-12-25 — End: 2021-12-25
  Administered 2021-12-25: 0 mL via INTRAVENOUS

## 2021-12-25 MED ORDER — DEXTROSE 10 % IN WATER (D10W) INTRAVENOUS SOLUTION
INTRAVENOUS | Status: DC
Start: 2021-12-25 — End: 2021-12-26
  Administered 2021-12-26: 0 mL via INTRAVENOUS
  Administered 2021-12-26: 100 mL via INTRAVENOUS

## 2021-12-25 MED ORDER — INSULIN REGULAR HUMAN 100 UNIT/ML INJECTION - CHARGE BY DOSE
12.0000 [IU] | Freq: Once | INTRAMUSCULAR | Status: AC
Start: 2021-12-25 — End: 2021-12-25
  Administered 2021-12-25: 12 [IU] via SUBCUTANEOUS
  Filled 2021-12-25: qty 36

## 2021-12-25 MED ORDER — AMLODIPINE 10 MG TABLET
10.0000 mg | ORAL_TABLET | Freq: Every day | ORAL | Status: DC
Start: 2021-12-25 — End: 2022-01-08
  Administered 2021-12-25 – 2021-12-26 (×2): 10 mg via ORAL
  Administered 2021-12-27 – 2021-12-28 (×2): 0 mg via ORAL
  Administered 2021-12-29 – 2021-12-30 (×2): 10 mg via ORAL
  Administered 2021-12-31: 0 mg via ORAL
  Administered 2022-01-01 – 2022-01-08 (×8): 10 mg via ORAL
  Filled 2021-12-25 (×13): qty 1

## 2021-12-25 MED ORDER — DEXTROSE 5 % AND 0.45 % SODIUM CHLORIDE INTRAVENOUS SOLUTION
INTRAVENOUS | Status: DC
Start: 2021-12-25 — End: 2021-12-26
  Administered 2021-12-25: 0 mL via INTRAVENOUS
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
## 2021-12-25 NOTE — Nurses Notes (Signed)
2050 Pt glucose is 60 on D5 1/2 at 125cc/hr. Order to start insulin at 4unit/hr. Notified Angie Hutchinson NP for new orders.  2110 New orders for 1amp D50IV, D10 at 100 cc/hr and start insulin 2unit/hr.

## 2021-12-25 NOTE — Progress Notes (Signed)
Jordan Buckley  IP PROGRESS NOTE      Date of Admission:  12/23/2021  Hospital Day:  LOS: 2 days       Subjective:   Patient is still lethargic but actually responsive today.  She is interacting some with her daughter.  She is off the insulin drip, is not eating yet. Received 10 units Glargine last night.    Vitals:  Temp (24hrs) Max:36.8 C (98.3 F)      Temperature: 36.8 C (98.3 F)  BP (Non-Invasive): (!) 154/90  MAP (Non-Invasive): 107 mmHG  Heart Rate: 75  Respiratory Rate: 17  SpO2: 95 %    I/O:  I/O last 24 hours:      Intake/Output Summary (Last 24 hours) at 12/25/2021 1636  Last data filed at 12/25/2021 1612  Gross per 24 hour   Intake 3470.83 ml   Output 700 ml   Net 2770.83 ml       PE:  Gen: NAD, lethargic  Skin: warm, dry  HENT: NC/AT, PERRL, normal conjunctivae, moist oral mucosa  Neck: normal circumference, no JVD, no adenopathy  Chest: NL breathing, clear BSs bilaterally  Cor: S1/S2, RRR, no murmurs  Abd: normal bowel sounds, soft, NT  GU: foley in place, clear yellow urine  Ext: no LE edema, 2+ radial pulses  Neuro: confused, awakens to voice in nods that she can hear me      Current Meds:  acetaminophen (TYLENOL) tablet, 650 mg, Oral, Q4H PRN  alcohol 62 % (NOZIN NASAL SANITIZER) nasal solution, 1 Each, Each Nostril, 2x/day  dextrose 50% (0.5 g/mL) injection - syringe, 25 g, Intravenous, Q15 Min PRN   Or  dextrose 50% (0.5 g/mL) injection - syringe, 12.5 g, Intravenous, Q15 Min PRN   Or  glucagon (GLUCAGEN DIAGNOSTIC KIT) injection 1 mg, 1 mg, Subcutaneous, Q15 Min PRN   Or  glucagon (GLUCAGEN DIAGNOSTIC KIT) injection 1 mg, 1 mg, IntraMUSCULAR, Q15 Min PRN  heparin 5,000 unit/mL injection, 5,000 Units, Subcutaneous, Q8HRS  insulin glargine 100 units/mL injection, 10 Units, Subcutaneous, NIGHTLY  LR premix infusion, , Intravenous, Continuous  miconazole (MONISTAT) 2% vaginal cream, 1 Applicator, Topical, 2x/day  SSIP insulin R human (HUMULIN R) 100 units/mL injection, 0-12 Units,  Subcutaneous, Q4H PRN        Labs:  Reviewed:   Results for orders placed or performed during the hospital encounter of 12/23/21 (from the past 24 hour(s))   BASIC METABOLIC PANEL   Result Value Ref Range    SODIUM 140 136 - 145 mmol/L    POTASSIUM 4.2 3.5 - 5.1 mmol/L    CHLORIDE 110 (H) 98 - 107 mmol/L    CO2 TOTAL 25 21 - 31 mmol/L    ANION GAP 5 (L) 10 - 20 mmol/L    CALCIUM 9.1 8.6 - 10.3 mg/dL    GLUCOSE 129 (H) 74 - 109 mg/dL    BUN 43 (H) 7 - 25 mg/dL    CREATININE 1.16 0.60 - 1.30 mg/dL    BUN/CREA RATIO 37 (H) 6 - 22    ESTIMATED GFR 47 (L) >59 mL/min/1.85m2    OSMOLALITY, CALCULATED 292 (H) 270 - 290 mOsm/kg    Narrative    Estimated Glomerular Filtration Rate (eGFR) is calculated using the CKD-EPI (2021) equation, intended for patients 158years of age and older. If gender is not documented or "unknown", there will be no eGFR calculation.   BASIC METABOLIC PANEL   Result Value Ref Range    SODIUM 137  136 - 145 mmol/L    POTASSIUM 4.4 3.5 - 5.1 mmol/L    CHLORIDE 108 (H) 98 - 107 mmol/L    CO2 TOTAL 21 21 - 31 mmol/L    ANION GAP 8 (L) 10 - 20 mmol/L    CALCIUM 8.8 8.6 - 10.3 mg/dL    GLUCOSE 394 (H) 74 - 109 mg/dL    BUN 39 (H) 7 - 25 mg/dL    CREATININE 1.04 0.60 - 1.30 mg/dL    BUN/CREA RATIO 38 (H) 6 - 22    ESTIMATED GFR 54 (L) >59 mL/min/1.33m2    OSMOLALITY, CALCULATED 300 (H) 270 - 290 mOsm/kg    Narrative    Estimated Glomerular Filtration Rate (eGFR) is calculated using the CKD-EPI (2021) equation, intended for patients 16years of age and older. If gender is not documented or "unknown", there will be no eGFR calculation.   BASIC METABOLIC PANEL   Result Value Ref Range    SODIUM 137 136 - 145 mmol/L    POTASSIUM 3.8 3.5 - 5.1 mmol/L    CHLORIDE 109 (H) 98 - 107 mmol/L    CO2 TOTAL 17 (L) 21 - 31 mmol/L    ANION GAP 11 10 - 20 mmol/L    CALCIUM 8.7 8.6 - 10.3 mg/dL    GLUCOSE 329 (H) 74 - 109 mg/dL    BUN 37 (H) 7 - 25 mg/dL    CREATININE 1.15 0.60 - 1.30 mg/dL    BUN/CREA RATIO 32 (H) 6 - 22     ESTIMATED GFR 48 (L) >59 mL/min/1.740m    OSMOLALITY, CALCULATED 296 (H) 270 - 290 mOsm/kg    Narrative    Estimated Glomerular Filtration Rate (eGFR) is calculated using the CKD-EPI (2021) equation, intended for patients 1872ears of age and older. If gender is not documented or "unknown", there will be no eGFR calculation.   CBC   Result Value Ref Range    WBCS UNCORRECTED 5.7 x10^3/uL    WBC 5.7 4.0 - 10.5 x10^3/uL    RBC 3.34 (L) 4.20 - 5.40 x10^6/uL    HGB 10.3 (L) 12.5 - 16.0 g/dL    HCT 30.2 (L) 37.0 - 47.0 %    MCV 90.4 78.0 - 99.0 fL    MCH 30.9 27.0 - 32.0 pg    MCHC 34.1 32.0 - 36.0 g/dL    RDW 14.3 11.6 - 14.8 %    PLATELETS 249 140 - 440 x10^3/uL    MPV 6.7 (L) 7.4 - 10.4 fL   COMPREHENSIVE METABOLIC PANEL, NON-FASTING   Result Value Ref Range    SODIUM 140 136 - 145 mmol/L    POTASSIUM 3.3 (L) 3.5 - 5.1 mmol/L    CHLORIDE 111 (H) 98 - 107 mmol/L    CO2 TOTAL 24 21 - 31 mmol/L    ANION GAP 5 (L) 10 - 20 mmol/L    BUN 34 (H) 7 - 25 mg/dL    CREATININE 0.94 0.60 - 1.30 mg/dL    BUN/CREA RATIO 36 (H) 6 - 22    ESTIMATED GFR 61 >59 mL/min/1.7384m   ALBUMIN 3.1 (L) 3.5 - 5.7 g/dL    CALCIUM 9.1 8.6 - 10.3 mg/dL    GLUCOSE 67 (L) 74 - 109 mg/dL    ALKALINE PHOSPHATASE 69 34 - 104 U/L    ALT (SGPT) 8 7 - 52 U/L    AST (SGOT) 12 (L) 13 - 39 U/L    BILIRUBIN TOTAL 0.3 0.3 -  1.2 mg/dL    PROTEIN TOTAL 5.4 (L) 6.4 - 8.9 g/dL    ALBUMIN/GLOBULIN RATIO 1.3 0.8 - 1.4    OSMOLALITY, CALCULATED 285 270 - 290 mOsm/kg    CALCIUM, CORRECTED 10.0 8.9 - 10.8 mg/dL    GLOBULIN 2.3 (L) 2.9 - 5.4    Narrative    Estimated Glomerular Filtration Rate (eGFR) is calculated using the CKD-EPI (2021) equation, intended for patients 57 years of age and older. If gender is not documented or "unknown", there will be no eGFR calculation.   BASIC METABOLIC PANEL   Result Value Ref Range    SODIUM 140 136 - 145 mmol/L    POTASSIUM 3.3 (L) 3.5 - 5.1 mmol/L    CHLORIDE 111 (H) 98 - 107 mmol/L    CO2 TOTAL 24 21 - 31 mmol/L    ANION GAP 5  (L) 10 - 20 mmol/L    CALCIUM 9.1 8.6 - 10.3 mg/dL    GLUCOSE 67 (L) 74 - 109 mg/dL    BUN 34 (H) 7 - 25 mg/dL    CREATININE 0.94 0.60 - 1.30 mg/dL    BUN/CREA RATIO 36 (H) 6 - 22    ESTIMATED GFR 61 >59 mL/min/1.5m2    OSMOLALITY, CALCULATED 285 270 - 290 mOsm/kg    Narrative    Estimated Glomerular Filtration Rate (eGFR) is calculated using the CKD-EPI (2021) equation, intended for patients 176years of age and older. If gender is not documented or "unknown", there will be no eGFR calculation.   BASIC METABOLIC PANEL   Result Value Ref Range    SODIUM 134 (L) 136 - 145 mmol/L    POTASSIUM 4.4 3.5 - 5.1 mmol/L    CHLORIDE 107 98 - 107 mmol/L    CO2 TOTAL 15 (L) 21 - 31 mmol/L    ANION GAP 12 10 - 20 mmol/L    CALCIUM 8.8 8.6 - 10.3 mg/dL    GLUCOSE 226 (H) 74 - 109 mg/dL    BUN 33 (H) 7 - 25 mg/dL    CREATININE 0.85 0.60 - 1.30 mg/dL    BUN/CREA RATIO 39 (H) 6 - 22    ESTIMATED GFR 69 >59 mL/min/1.782m    OSMOLALITY, CALCULATED 283 270 - 290 mOsm/kg    Narrative    Estimated Glomerular Filtration Rate (eGFR) is calculated using the CKD-EPI (2021) equation, intended for patients 1847ears of age and older. If gender is not documented or "unknown", there will be no eGFR calculation.   POC BLOOD GLUCOSE (RESULTS)   Result Value Ref Range    GLUCOSE, POC 126 50 - 500 mg/dl   POC BLOOD GLUCOSE (RESULTS)   Result Value Ref Range    GLUCOSE, POC 115 50 - 500 mg/dl   POC BLOOD GLUCOSE (RESULTS)   Result Value Ref Range    GLUCOSE, POC 67 50 - 500 mg/dl   POC BLOOD GLUCOSE (RESULTS)   Result Value Ref Range    GLUCOSE, POC 51 50 - 500 mg/dl   POC BLOOD GLUCOSE (RESULTS)   Result Value Ref Range    GLUCOSE, POC 387 50 - 500 mg/dl   POC BLOOD GLUCOSE (RESULTS)   Result Value Ref Range    GLUCOSE, POC 167 50 - 500 mg/dl   POC BLOOD GLUCOSE (RESULTS)   Result Value Ref Range    GLUCOSE, POC 12 (LL) 50 - 500 mg/dl   POC BLOOD GLUCOSE (RESULTS)   Result Value Ref Range    GLUCOSE, POC  21 (LL) 50 - 500 mg/dl   POC BLOOD GLUCOSE  (RESULTS)   Result Value Ref Range    GLUCOSE, POC 114 50 - 500 mg/dl   POC BLOOD GLUCOSE (RESULTS)   Result Value Ref Range    GLUCOSE, POC 310 50 - 500 mg/dl   POC BLOOD GLUCOSE (RESULTS)   Result Value Ref Range    GLUCOSE, POC 334 50 - 500 mg/dl   POC BLOOD GLUCOSE (RESULTS)   Result Value Ref Range    GLUCOSE, POC 133 50 - 500 mg/dl          Radiology:  Reviewed:   Results for orders placed or performed during the hospital encounter of 12/23/21 (from the past 24 hour(s))   XR AP MOBILE CHEST     Status: None    Narrative    Jordan Buckley      PROCEDURE DESCRIPTION: XR PORTABLE CHEST X-RAY    CLINICAL HISTORY:hypoxemia    COMPARISON:12/23/2021          FINDINGS:  A single view of the chest was obtained. No focal alveolar infiltrate is identified. The bronchovascular markings appear unchanged. The patient is status post median sternotomy. The heart size is within limits of normal. No pleural effusion or pneumothorax is seen.    Mild elevation of the right hemidiaphragm is noted. Mild degenerative change of the mid to lower thoracic spine is seen.        Impression    No focal alveolar infiltrate is identified.      Radiologist location ID: [id number]     CT BRAIN WO IV CONTRAST     Status: None    Narrative    Jordan Buckley    PROCEDURE DESCRIPTION: CT OF THE HEAD WITHOUT INTRAVENOUS CONTRAST    CLINICAL HISTORY: Altered mental status; slurred speech       COMPARISON: 12/23/2021      TECHNIQUE: A CT of the head was performed utilizing contiguous axial imaging. No intravenous contrast material was injected.      FINDINGS: The ventricular Buckley, cortical sulci, and basilar cisterns appear mildly to moderately  dilated consistent with mild to moderate cerebral and cerebellar volume loss. No evidence of an acute intracranial hemorrhage is seen. No mass effect or midline shift is noted. No evidence of an acute cerebrovascular accident is seen.  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  Chronic small vessel ischemic changes are identified in the periventricular deep white matter and centrum semiovale.  The paranasal sinuses and mastoid air cells are within normal appearance.        Impression    No evidence of an acute intracranial hemorrhage is noted.      Radiologist location ID: [id number]             A/P:  1. DKA  -Gap reopened again; difficult case, she got hypoglycemic with just Glargine 10 last night; will check BMP now and start nph 8/8 and regular 6 tidac; if gap is ok, will feed tonight, but she can only eat if she gets regular tidac    2. AKI  -Cr 1.5 > 0.9, resolved    3. Acute encephalopathy  -Repeat CT head 5/17 shows no evidence of new stroke  -In setting of DKA, slowly improving    4. DM2  -A1c 5/15: 9.6%    5. High BP  -No home med listed; will start Amlodipine 10 mg daily  6. Hypothyroidism  -Continue Synthroid 75 mcg daily    Heparin SQ    Full code

## 2021-12-25 NOTE — Nurses Notes (Signed)
Discussed latest labs/ pt condition with Dr. Leilani Merl. See new orders.

## 2021-12-25 NOTE — Care Plan (Signed)
Pt is off insulin drip, glucose down in 200's. Pt has 1/2NS at 125, 500 cc urine from foley. Pt incont of mod size loose stool. Sacral area is excoriated. Zinc cream applied to area.  Pt is oriented to person, but confused to place/time. Pt has dozed off and on during the night. Pt remains calm and cooperative.    Problem: Adult Inpatient Plan of Care  Goal: Plan of Care Review  Outcome: Ongoing (see interventions/notes)  Flowsheets (Taken 12/25/2021 0615)  Progress: improving  Plan of Care Reviewed With: patient     Problem: Adult Inpatient Plan of Care  Goal: Patient-Specific Goal (Individualized)  Outcome: Ongoing (see interventions/notes)  Flowsheets  Taken 12/25/2021 0400  Individualized Care Needs: Freq glucose checks, ADL's  Anxieties, Fears or Concerns: Not sure why she's in hospital  Patient-Specific Goals (Include Timeframe): Hopes to go home soon.  Plan of Care Reviewed With: patient  Taken 12/25/2021 0000  Individualized Care Needs: Freq glucose checks, ADL's  Anxieties, Fears or Concerns: Not sure why she's in hospital  Patient-Specific Goals (Include Timeframe): Hopes to go home soon.  Plan of Care Reviewed With: patient  Taken 12/24/2021 2000  Individualized Care Needs: Freq glucose checks, ADL's  Anxieties, Fears or Concerns: Not sure why she's in hospital  Patient-Specific Goals (Include Timeframe): Hopes to go home soon.  Plan of Care Reviewed With: patient

## 2021-12-26 DIAGNOSIS — N179 Acute kidney failure, unspecified: Secondary | ICD-10-CM

## 2021-12-26 DIAGNOSIS — E11649 Type 2 diabetes mellitus with hypoglycemia without coma: Secondary | ICD-10-CM

## 2021-12-26 DIAGNOSIS — E1110 Type 2 diabetes mellitus with ketoacidosis without coma: Principal | ICD-10-CM

## 2021-12-26 LAB — COMPREHENSIVE METABOLIC PANEL, NON-FASTING
ALBUMIN/GLOBULIN RATIO: 1.3 (ref 0.8–1.4)
ALBUMIN: 3 g/dL — ABNORMAL LOW (ref 3.5–5.7)
ALKALINE PHOSPHATASE: 67 U/L (ref 34–104)
ALT (SGPT): 9 U/L (ref 7–52)
ANION GAP: 4 mmol/L — ABNORMAL LOW (ref 10–20)
AST (SGOT): 12 U/L — ABNORMAL LOW (ref 13–39)
BILIRUBIN TOTAL: 0.4 mg/dL (ref 0.3–1.2)
BUN/CREA RATIO: 34 — ABNORMAL HIGH (ref 6–22)
BUN: 20 mg/dL (ref 7–25)
CALCIUM, CORRECTED: 9.8 mg/dL (ref 8.9–10.8)
CALCIUM: 8.8 mg/dL (ref 8.6–10.3)
CHLORIDE: 104 mmol/L (ref 98–107)
CO2 TOTAL: 27 mmol/L (ref 21–31)
CREATININE: 0.59 mg/dL — ABNORMAL LOW (ref 0.60–1.30)
ESTIMATED GFR: 90 mL/min/{1.73_m2} (ref >59)
GLOBULIN: 2.3 — ABNORMAL LOW (ref 2.9–5.4)
GLUCOSE: 89 mg/dL (ref 74–109)
OSMOLALITY, CALCULATED: 272 mOsm/kg (ref 270–290)
POTASSIUM: 3.5 mmol/L (ref 3.5–5.1)
PROTEIN TOTAL: 5.3 g/dL — ABNORMAL LOW (ref 6.4–8.9)
SODIUM: 135 mmol/L — ABNORMAL LOW (ref 136–145)

## 2021-12-26 LAB — BASIC METABOLIC PANEL
ANION GAP: 7 mmol/L — ABNORMAL LOW (ref 10–20)
BUN/CREA RATIO: 22 (ref 6–22)
BUN: 15 mg/dL (ref 7–25)
CALCIUM: 8.6 mg/dL (ref 8.6–10.3)
CHLORIDE: 105 mmol/L (ref 98–107)
CO2 TOTAL: 22 mmol/L (ref 21–31)
CREATININE: 0.69 mg/dL (ref 0.60–1.30)
ESTIMATED GFR: 87 mL/min/{1.73_m2} (ref >59)
GLUCOSE: 140 mg/dL — ABNORMAL HIGH (ref 74–109)
OSMOLALITY, CALCULATED: 271 mOsm/kg (ref 270–290)
POTASSIUM: 3.6 mmol/L (ref 3.5–5.1)
SODIUM: 134 mmol/L — ABNORMAL LOW (ref 136–145)

## 2021-12-26 LAB — CBC
HCT: 33.7 % — ABNORMAL LOW (ref 37.0–47.0)
HGB: 11.1 g/dL — ABNORMAL LOW (ref 12.5–16.0)
MCH: 30 pg (ref 27.0–32.0)
MCHC: 33.1 g/dL (ref 32.0–36.0)
MCV: 90.7 fL (ref 78.0–99.0)
MPV: 6.4 fL — ABNORMAL LOW (ref 7.4–10.4)
PLATELETS: 238 10*3/uL (ref 140–440)
RBC: 3.72 10*6/uL — ABNORMAL LOW (ref 4.20–5.40)
RDW: 14.1 % (ref 11.6–14.8)
WBC: 4.7 10*3/uL (ref 4.0–10.5)
WBCS UNCORRECTED: 4.7 10*3/uL

## 2021-12-26 LAB — POC BLOOD GLUCOSE (RESULTS)
GLUCOSE, POC: 118 mg/dl (ref 50–500)
GLUCOSE, POC: 124 mg/dl (ref 50–500)
GLUCOSE, POC: 134 mg/dl (ref 50–500)
GLUCOSE, POC: 192 mg/dl (ref 50–500)
GLUCOSE, POC: 50 mg/dl (ref 50–500)
GLUCOSE, POC: 57 mg/dl (ref 50–500)
GLUCOSE, POC: 60 mg/dl (ref 50–500)
GLUCOSE, POC: 65 mg/dl (ref 50–500)
GLUCOSE, POC: 67 mg/dl (ref 50–500)
GLUCOSE, POC: 80 mg/dl (ref 50–500)
GLUCOSE, POC: 83 mg/dl (ref 50–500)
GLUCOSE, POC: 85 mg/dl (ref 50–500)
GLUCOSE, POC: 87 mg/dl (ref 50–500)
GLUCOSE, POC: 87 mg/dl (ref 50–500)
GLUCOSE, POC: 94 mg/dl (ref 50–500)

## 2021-12-26 LAB — MAGNESIUM: MAGNESIUM: 1.4 mg/dL — ABNORMAL LOW (ref 1.9–2.7)

## 2021-12-26 MED ORDER — INSULIN NPH ISOPHANE U-100 HUMAN 100 UNIT/ML SUBCUTANEOUS SUSP - CHARGE BY DOSE
8.0000 [IU] | Freq: Two times a day (BID) | SUBCUTANEOUS | Status: DC
Start: 2021-12-26 — End: 2021-12-27
  Administered 2021-12-26: 8 [IU] via SUBCUTANEOUS
  Administered 2021-12-26: 0 [IU] via SUBCUTANEOUS
  Filled 2021-12-26 (×2): qty 8

## 2021-12-26 MED ORDER — MAGNESIUM SULFATE 1 GRAM/100 ML IN DEXTROSE 5 % INTRAVENOUS PIGGYBACK
1.0000 g | INJECTION | INTRAVENOUS | Status: AC
Start: 2021-12-26 — End: 2021-12-26
  Administered 2021-12-26: 1 g via INTRAVENOUS
  Administered 2021-12-26 (×2): 0 g via INTRAVENOUS
  Administered 2021-12-26: 1 g via INTRAVENOUS
  Filled 2021-12-26: qty 200

## 2021-12-26 MED ORDER — DEXTROSE 50 % IN WATER (D50W) INTRAVENOUS SYRINGE
25.0000 g | INJECTION | Freq: Once | INTRAVENOUS | Status: AC
Start: 2021-12-26 — End: 2021-12-26
  Administered 2021-12-26: 50 mL via INTRAVENOUS

## 2021-12-26 NOTE — Nurses Notes (Signed)
1954: Pt states, " It feels like my sugar is low." POC GLU 67. Pt awake and alert. Provided with juice and a snack.    2126: POC GLU 57 despite previous intervention. Remains awake and alert. Provided with additional juice and snack at this time. 25 ML D50% also administered. Scheduled insulin held. Michel Santee, NP notified.

## 2021-12-26 NOTE — Progress Notes (Signed)
[facility]  IP PROGRESS NOTE      Date of Admission:  12/23/2021  Hospital Day:  LOS: 3 days       Subjective:   Patient alert today, looks great. She has no complaints. Insulin gtt stopped this am.    Vitals:  Temp (24hrs) Max:37 C (98.6 F)      Temperature: 37 C (98.6 F)  BP (Non-Invasive): (!) 150/60  MAP (Non-Invasive): 84 mmHG  Heart Rate: 87  Respiratory Rate: 15  SpO2: 97 %    I/O:  I/O last 24 hours:      Intake/Output Summary (Last 24 hours) at 12/26/2021 1330  Last data filed at 12/26/2021 1046  Gross per 24 hour   Intake 529.03 ml   Output 2100 ml   Net -1570.97 ml       PE:  Gen: NAD, lethargic  Skin: warm, dry  HENT: NC/AT, PERRL, normal conjunctivae, moist oral mucosa  Neck: normal circumference, no JVD, no adenopathy  Chest: NL breathing, clear BSs bilaterally  Cor: S1/S2, RRR, no murmurs  Abd: normal bowel sounds, soft, NT  GU: foley in place, clear yellow urine  Ext: no LE edema, 2+ radial pulses  Neuro: confused, awakens to voice in nods that she can hear me      Current Meds:  acetaminophen (TYLENOL) tablet, 650 mg, Oral, Q4H PRN  alcohol 62 % (NOZIN NASAL SANITIZER) nasal solution, 1 Each, Each Nostril, 2x/day  amLODIPine (NORVASC) tablet, 10 mg, Oral, Daily  dextrose 50% (0.5 g/mL) injection - syringe, 25 g, Intravenous, Q15 Min PRN   Or  dextrose 50% (0.5 g/mL) injection - syringe, 12.5 g, Intravenous, Q15 Min PRN   Or  glucagon (GLUCAGEN DIAGNOSTIC KIT) injection 1 mg, 1 mg, Subcutaneous, Q15 Min PRN   Or  glucagon (GLUCAGEN DIAGNOSTIC KIT) injection 1 mg, 1 mg, IntraMUSCULAR, Q15 Min PRN  heparin 5,000 unit/mL injection, 5,000 Units, Subcutaneous, Q8HRS  HYDROcodone-acetaminophen (NORCO) 5-325 mg per tablet, 1 Tablet, Oral, Q6H PRN  insulin NPH human 100 units/mL injection, 8 Units, Subcutaneous, 2x/day  insulin R human 100 units/mL injection, 6 Units, Subcutaneous, 3x/day AC  miconazole (MONISTAT) 2% vaginal cream, 1 Applicator, Topical, 2x/day  SSIP insulin R human  (HUMULIN R) 100 units/mL injection, 0-12 Units, Subcutaneous, Q4H PRN        Labs:  Reviewed:   Results for orders placed or performed during the hospital encounter of 12/23/21 (from the past 24 hour(s))   BASIC METABOLIC PANEL - ONCE   Result Value Ref Range    SODIUM 132 (L) 136 - 145 mmol/L    POTASSIUM 4.5 3.5 - 5.1 mmol/L    CHLORIDE 105 98 - 107 mmol/L    CO2 TOTAL 13 (L) 21 - 31 mmol/L    ANION GAP 14 10 - 20 mmol/L    CALCIUM 8.7 8.6 - 10.3 mg/dL    GLUCOSE 95 74 - 109 mg/dL    BUN 29 (H) 7 - 25 mg/dL    CREATININE 0.82 0.60 - 1.30 mg/dL    BUN/CREA RATIO 35 (H) 6 - 22    ESTIMATED GFR 72 >59 mL/min/1.36m2    OSMOLALITY, CALCULATED 270 270 - 290 mOsm/kg    Narrative    Estimated Glomerular Filtration Rate (eGFR) is calculated using the CKD-EPI (2021) equation, intended for patients 170years of age and older. If gender is not documented or "unknown", there will be no eGFR calculation.   CBC   Result Value Ref Range  WBCS UNCORRECTED 4.7 x10^3/uL    WBC 4.7 4.0 - 10.5 x10^3/uL    RBC 3.72 (L) 4.20 - 5.40 x10^6/uL    HGB 11.1 (L) 12.5 - 16.0 g/dL    HCT 33.7 (L) 37.0 - 47.0 %    MCV 90.7 78.0 - 99.0 fL    MCH 30.0 27.0 - 32.0 pg    MCHC 33.1 32.0 - 36.0 g/dL    RDW 14.1 11.6 - 14.8 %    PLATELETS 238 140 - 440 x10^3/uL    MPV 6.4 (L) 7.4 - 10.4 fL   COMPREHENSIVE METABOLIC PANEL, NON-FASTING   Result Value Ref Range    SODIUM 135 (L) 136 - 145 mmol/L    POTASSIUM 3.5 3.5 - 5.1 mmol/L    CHLORIDE 104 98 - 107 mmol/L    CO2 TOTAL 27 21 - 31 mmol/L    ANION GAP 4 (L) 10 - 20 mmol/L    BUN 20 7 - 25 mg/dL    CREATININE 0.59 (L) 0.60 - 1.30 mg/dL    BUN/CREA RATIO 34 (H) 6 - 22    ESTIMATED GFR 90 >59 mL/min/1.47m2    ALBUMIN 3.0 (L) 3.5 - 5.7 g/dL    CALCIUM 8.8 8.6 - 10.3 mg/dL    GLUCOSE 89 74 - 109 mg/dL    ALKALINE PHOSPHATASE 67 34 - 104 U/L    ALT (SGPT) 9 7 - 52 U/L    AST (SGOT) 12 (L) 13 - 39 U/L    BILIRUBIN TOTAL 0.4 0.3 - 1.2 mg/dL    PROTEIN TOTAL 5.3 (L) 6.4 - 8.9 g/dL    ALBUMIN/GLOBULIN RATIO  1.3 0.8 - 1.4    OSMOLALITY, CALCULATED 272 270 - 290 mOsm/kg    CALCIUM, CORRECTED 9.8 8.9 - 10.8 mg/dL    GLOBULIN 2.3 (L) 2.9 - 5.4    Narrative    Estimated Glomerular Filtration Rate (eGFR) is calculated using the CKD-EPI (2021) equation, intended for patients 121years of age and older. If gender is not documented or "unknown", there will be no eGFR calculation.   MAGNESIUM   Result Value Ref Range    MAGNESIUM 1.4 (L) 1.9 - 2.7 mg/dL   POC BLOOD GLUCOSE (RESULTS)   Result Value Ref Range    GLUCOSE, POC 133 50 - 500 mg/dl   POC BLOOD GLUCOSE (RESULTS)   Result Value Ref Range    GLUCOSE, POC 63 50 - 500 mg/dl   POC BLOOD GLUCOSE (RESULTS)   Result Value Ref Range    GLUCOSE, POC 60 50 - 500 mg/dl   POC BLOOD GLUCOSE (RESULTS)   Result Value Ref Range    GLUCOSE, POC 79 50 - 500 mg/dl   POC BLOOD GLUCOSE (RESULTS)   Result Value Ref Range    GLUCOSE, POC 70 50 - 500 mg/dl   POC BLOOD GLUCOSE (RESULTS)   Result Value Ref Range    GLUCOSE, POC 87 50 - 500 mg/dl   POC BLOOD GLUCOSE (RESULTS)   Result Value Ref Range    GLUCOSE, POC 60 50 - 500 mg/dl   POC BLOOD GLUCOSE (RESULTS)   Result Value Ref Range    GLUCOSE, POC 124 50 - 500 mg/dl   POC BLOOD GLUCOSE (RESULTS)   Result Value Ref Range    GLUCOSE, POC 87 50 - 500 mg/dl   POC BLOOD GLUCOSE (RESULTS)   Result Value Ref Range    GLUCOSE, POC 118 50 - 500 mg/dl   POC BLOOD GLUCOSE (  RESULTS)   Result Value Ref Range    GLUCOSE, POC 94 50 - 500 mg/dl   POC BLOOD GLUCOSE (RESULTS)   Result Value Ref Range    GLUCOSE, POC 80 50 - 500 mg/dl   POC BLOOD GLUCOSE (RESULTS)   Result Value Ref Range    GLUCOSE, POC 83 50 - 500 mg/dl   POC BLOOD GLUCOSE (RESULTS)   Result Value Ref Range    GLUCOSE, POC 65 50 - 500 mg/dl   POC BLOOD GLUCOSE (RESULTS)   Result Value Ref Range    GLUCOSE, POC 50 50 - 500 mg/dl   POC BLOOD GLUCOSE (RESULTS)   Result Value Ref Range    GLUCOSE, POC 192 50 - 500 mg/dl            A/P:  1. DKA  -Gap closed; start nph 8/8 and regular 6 tidac    2.  AKI  -Cr 1.5 > 0.9, resolved    3. Acute encephalopathy  -Repeat CT head 5/17 shows no evidence of new stroke  -In setting of DKA, resolved    4. DM2  -A1c 5/15: 9.6%    5. High BP  -No home med listed; continue Amlodipine 10 mg daily    6. Hypothyroidism  -Continue Synthroid 75 mcg daily    Heparin SQ    Full code

## 2021-12-26 NOTE — Care Plan (Signed)
Pt has remained awake all night, remains in iv insulin drip with d10 at 100 cc/hr. Pt has gotten 2 doses of d50. Pt glucose has remained under 130.    Problem: Adult Inpatient Plan of Care  Goal: Plan of Care Review  Outcome: Ongoing (see interventions/notes)  Flowsheets (Taken 12/26/2021 3338)  Progress: improving  Plan of Care Reviewed With: patient     Problem: Adult Inpatient Plan of Care  Goal: Patient-Specific Goal (Individualized)  Outcome: Ongoing (see interventions/notes)  Flowsheets  Taken 12/26/2021 0339  Individualized Care Needs: Freq glucose checks, ADL's  Anxieties, Fears or Concerns: Not sure why she's in hospital  Patient-Specific Goals (Include Timeframe): Hopes to go home soon.  Plan of Care Reviewed With: patient  Taken 12/25/2021 2352  Individualized Care Needs: Freq glucose checks, ADL's  Anxieties, Fears or Concerns: Not sure why she's in hospital  Patient-Specific Goals (Include Timeframe): Hopes to go home soon.  Plan of Care Reviewed With: patient  Taken 12/25/2021 1930  Individualized Care Needs: Freq glucose checks, ADL's  Anxieties, Fears or Concerns: Not sure why she's in hospital  Patient-Specific Goals (Include Timeframe): Hopes to go home soon.  Plan of Care Reviewed With: patient

## 2021-12-27 ENCOUNTER — Other Ambulatory Visit (HOSPITAL_COMMUNITY): Payer: Self-pay

## 2021-12-27 ENCOUNTER — Ambulatory Visit (HOSPITAL_COMMUNITY): Payer: Self-pay

## 2021-12-27 DIAGNOSIS — M4856XA Collapsed vertebra, not elsewhere classified, lumbar region, initial encounter for fracture: Secondary | ICD-10-CM

## 2021-12-27 DIAGNOSIS — E119 Type 2 diabetes mellitus without complications: Secondary | ICD-10-CM

## 2021-12-27 DIAGNOSIS — M549 Dorsalgia, unspecified: Secondary | ICD-10-CM

## 2021-12-27 DIAGNOSIS — I10 Essential (primary) hypertension: Secondary | ICD-10-CM

## 2021-12-27 DIAGNOSIS — E039 Hypothyroidism, unspecified: Secondary | ICD-10-CM

## 2021-12-27 DIAGNOSIS — G9340 Encephalopathy, unspecified: Secondary | ICD-10-CM

## 2021-12-27 LAB — BASIC METABOLIC PANEL
ANION GAP: 5 mmol/L — ABNORMAL LOW (ref 10–20)
BUN/CREA RATIO: 20 (ref 6–22)
BUN: 13 mg/dL (ref 7–25)
CALCIUM: 8.9 mg/dL (ref 8.6–10.3)
CHLORIDE: 103 mmol/L (ref 98–107)
CO2 TOTAL: 29 mmol/L (ref 21–31)
CREATININE: 0.65 mg/dL (ref 0.60–1.30)
ESTIMATED GFR: 88 mL/min/{1.73_m2} (ref >59)
GLUCOSE: 159 mg/dL — ABNORMAL HIGH (ref 74–109)
OSMOLALITY, CALCULATED: 277 mOsm/kg (ref 270–290)
POTASSIUM: 3.8 mmol/L (ref 3.5–5.1)
SODIUM: 137 mmol/L (ref 136–145)

## 2021-12-27 LAB — CBC WITH DIFF
BASOPHIL #: 0 10*3/uL (ref 0.00–0.30)
BASOPHIL %: 0 % (ref 0–3)
EOSINOPHIL #: 0.2 10*3/uL (ref 0.00–0.80)
EOSINOPHIL %: 4 % (ref 0–7)
HCT: 34 % — ABNORMAL LOW (ref 37.0–47.0)
HGB: 11.6 g/dL — ABNORMAL LOW (ref 12.5–16.0)
LYMPHOCYTE #: 0.9 10*3/uL — ABNORMAL LOW (ref 1.10–5.00)
LYMPHOCYTE %: 18 % — ABNORMAL LOW (ref 25–45)
MCH: 30.7 pg (ref 27.0–32.0)
MCHC: 34.1 g/dL (ref 32.0–36.0)
MCV: 90 fL (ref 78.0–99.0)
MONOCYTE #: 0.3 10*3/uL (ref 0.00–1.30)
MONOCYTE %: 6 % (ref 0–12)
MPV: 6.9 fL — ABNORMAL LOW (ref 7.4–10.4)
NEUTROPHIL #: 3.7 10*3/uL (ref 1.80–8.40)
NEUTROPHIL %: 73 % (ref 40–76)
PLATELETS: 240 10*3/uL (ref 140–440)
RBC: 3.78 10*6/uL — ABNORMAL LOW (ref 4.20–5.40)
RDW: 14.2 % (ref 11.6–14.8)
WBC: 5 10*3/uL (ref 4.0–10.5)
WBCS UNCORRECTED: 5 10*3/uL

## 2021-12-27 LAB — POC BLOOD GLUCOSE (RESULTS)
GLUCOSE, POC: 103 mg/dl (ref 50–500)
GLUCOSE, POC: 103 mg/dl (ref 50–500)
GLUCOSE, POC: 131 mg/dl (ref 50–500)
GLUCOSE, POC: 165 mg/dl (ref 50–500)
GLUCOSE, POC: 189 mg/dl (ref 50–500)
GLUCOSE, POC: 221 mg/dl (ref 50–500)
GLUCOSE, POC: 272 mg/dl (ref 50–500)
GLUCOSE, POC: 323 mg/dl (ref 50–500)
GLUCOSE, POC: 48 mg/dl — CL (ref 50–500)
GLUCOSE, POC: 54 mg/dl (ref 50–500)
GLUCOSE, POC: 93 mg/dl (ref 50–500)

## 2021-12-27 LAB — MAGNESIUM: MAGNESIUM: 1.6 mg/dL — ABNORMAL LOW (ref 1.9–2.7)

## 2021-12-27 MED ORDER — INSULIN NPH ISOPHANE U-100 HUMAN 100 UNIT/ML SUBCUTANEOUS SUSP - CHARGE BY DOSE
6.0000 [IU] | Freq: Two times a day (BID) | SUBCUTANEOUS | Status: DC
Start: 2021-12-27 — End: 2021-12-28
  Administered 2021-12-27 (×2): 6 [IU] via SUBCUTANEOUS
  Filled 2021-12-27 (×2): qty 6

## 2021-12-27 MED ORDER — MAGNESIUM OXIDE 400 MG (241.3 MG MAGNESIUM) TABLET
400.0000 mg | ORAL_TABLET | Freq: Two times a day (BID) | ORAL | Status: DC
Start: 2021-12-27 — End: 2021-12-27

## 2021-12-27 MED ORDER — DEXTROSE 5 % AND 0.9 % SODIUM CHLORIDE INTRAVENOUS SOLUTION
INTRAVENOUS | Status: DC
Start: 2021-12-27 — End: 2021-12-27
  Administered 2021-12-27: 0 mL via INTRAVENOUS

## 2021-12-27 MED ORDER — INSULIN REGULAR HUMAN 100 UNIT/ML INJECTION - CHARGE BY DOSE
5.0000 [IU] | Freq: Three times a day (TID) | INTRAMUSCULAR | Status: DC
Start: 2021-12-27 — End: 2021-12-28
  Administered 2021-12-27: 0 [IU] via SUBCUTANEOUS
  Administered 2021-12-27: 5 [IU] via SUBCUTANEOUS
  Administered 2021-12-28: 0 [IU] via SUBCUTANEOUS
  Filled 2021-12-27: qty 15
  Filled 2021-12-27: qty 18

## 2021-12-27 MED ORDER — MAGNESIUM OXIDE 400 MG (241.3 MG MAGNESIUM) TABLET
400.0000 mg | ORAL_TABLET | Freq: Once | ORAL | Status: AC
Start: 2021-12-27 — End: 2021-12-27
  Administered 2021-12-27: 400 mg via ORAL
  Filled 2021-12-27: qty 1

## 2021-12-27 NOTE — Nurses Notes (Signed)
Pt sitting up in bed. No changes noted. No complaints voice. Pt remains calm and cooperative but continues to be confused at times.

## 2021-12-27 NOTE — Nurses Notes (Signed)
C/o back pain. 10/10. Medicated with Lortab for pain

## 2021-12-27 NOTE — Nurses Notes (Signed)
Dr. Mccoy at bedside and discussed treatment plan

## 2021-12-27 NOTE — Nurses Notes (Signed)
Informed Jordan Buckley (dtr) that pt would be transferred out of ccu to room 432B

## 2021-12-27 NOTE — Care Plan (Signed)
Problem: Adult Inpatient Plan of Care  Goal: Rounds/Family Conference  Flowsheets (Taken 12/27/2021 1015)  Participants:   dietitian/nutrition services   nursing   physician assistant   case manager  Note: Glucose improved. Appetite better and will not make changes. Nursing will call pharmacy to adjust insulin time schedule. Per CM family requesting for pt not to go back to [facility] and are addressing issues. Per Rozell Searing fnp plan to move pt out of ccu.

## 2021-12-27 NOTE — Transitional Care (Signed)
81 yo WF with fragile DM2 per her daughter admitted 12/23/21 with AMS from [facility]. She was noted to have R facial droop and R arm weakness. Glu was 886, BHB >8, HCO3 8. A1c 9.6%. She was started on insulin gtt and given IVFs, admitted to ICU. She reopened her gap twice and had to twice be placed back on insulin gtt while in ICU. Repeat CT head 5/17 showed no acute process. Her mentation returned to baseline with no focal deficits. Her AKI resolved. She has been off gtt since yesterday and is tolerating po intake, gap closed, on SQ insulin. Ok to transfer out of unit.

## 2021-12-27 NOTE — Progress Notes (Signed)
[facility]  IP PROGRESS NOTE      Date of Admission:  12/23/2021  Hospital Day:  LOS: 4 days       Subjective:   Patient complains of back pain but otherwise doing well.  Tolerating p.o. intake and bicarbonate is stable.    Vitals:  Temp (24hrs) Max:36.9 C (98.4 F)      Temperature: 36.6 C (97.8 F)  BP (Non-Invasive): 123/68  MAP (Non-Invasive): 86 mmHG  Heart Rate: 99  Respiratory Rate: 17  SpO2: 96 %    I/O:  I/O last 24 hours:      Intake/Output Summary (Last 24 hours) at 12/27/2021 1440  Last data filed at 12/27/2021 0736  Gross per 24 hour   Intake 878.92 ml   Output 1200 ml   Net -321.08 ml       PE:  Gen: NAD, lethargic  Skin: warm, dry  HENT: NC/AT, PERRL, normal conjunctivae, moist oral mucosa  Neck: normal circumference, no JVD, no adenopathy  Chest: NL breathing, clear BSs bilaterally  Cor: S1/S2, RRR, no murmurs  Abd: normal bowel sounds, soft, NT  GU: foley in place, clear yellow urine  Ext: no LE edema, 2+ radial pulses  Neuro: confused, awakens to voice in nods that she can hear me      Current Meds:  acetaminophen (TYLENOL) tablet, 650 mg, Oral, Q4H PRN  alcohol 62 % (NOZIN NASAL SANITIZER) nasal solution, 1 Each, Each Nostril, 2x/day  [Held by provider] amLODIPine (NORVASC) tablet, 10 mg, Oral, Daily  dextrose 50% (0.5 g/mL) injection - syringe, 25 g, Intravenous, Q15 Min PRN   Or  dextrose 50% (0.5 g/mL) injection - syringe, 12.5 g, Intravenous, Q15 Min PRN   Or  glucagon (GLUCAGEN DIAGNOSTIC KIT) injection 1 mg, 1 mg, Subcutaneous, Q15 Min PRN   Or  glucagon (GLUCAGEN DIAGNOSTIC KIT) injection 1 mg, 1 mg, IntraMUSCULAR, Q15 Min PRN  heparin 5,000 unit/mL injection, 5,000 Units, Subcutaneous, Q8HRS  HYDROcodone-acetaminophen (NORCO) 5-325 mg per tablet, 1 Tablet, Oral, Q6H PRN  insulin NPH human 100 units/mL injection, 6 Units, Subcutaneous, 2x/day  insulin R human 100 units/mL injection, 5 Units, Subcutaneous, 3x/day AC  miconazole (MONISTAT) 2% vaginal cream, 1 Applicator,  Topical, 2x/day  SSIP insulin R human (HUMULIN R) 100 units/mL injection, 0-12 Units, Subcutaneous, Q4H PRN        Labs:  Reviewed:   Results for orders placed or performed during the hospital encounter of 12/23/21 (from the past 24 hour(s))   BASIC METABOLIC PANEL   Result Value Ref Range    SODIUM 134 (L) 136 - 145 mmol/L    POTASSIUM 3.6 3.5 - 5.1 mmol/L    CHLORIDE 105 98 - 107 mmol/L    CO2 TOTAL 22 21 - 31 mmol/L    ANION GAP 7 (L) 10 - 20 mmol/L    CALCIUM 8.6 8.6 - 10.3 mg/dL    GLUCOSE 140 (H) 74 - 109 mg/dL    BUN 15 7 - 25 mg/dL    CREATININE 0.69 0.60 - 1.30 mg/dL    BUN/CREA RATIO 22 6 - 22    ESTIMATED GFR 87 >59 mL/min/1.28m2    OSMOLALITY, CALCULATED 271 270 - 290 mOsm/kg    Narrative    Estimated Glomerular Filtration Rate (eGFR) is calculated using the CKD-EPI (2021) equation, intended for patients 124years of age and older. If gender is not documented or "unknown", there will be no eGFR calculation.   BASIC METABOLIC PANEL - AM ONCE  Result Value Ref Range    SODIUM 137 136 - 145 mmol/L    POTASSIUM 3.8 3.5 - 5.1 mmol/L    CHLORIDE 103 98 - 107 mmol/L    CO2 TOTAL 29 21 - 31 mmol/L    ANION GAP 5 (L) 10 - 20 mmol/L    CALCIUM 8.9 8.6 - 10.3 mg/dL    GLUCOSE 159 (H) 74 - 109 mg/dL    BUN 13 7 - 25 mg/dL    CREATININE 0.65 0.60 - 1.30 mg/dL    BUN/CREA RATIO 20 6 - 22    ESTIMATED GFR 88 >59 mL/min/1.42m2    OSMOLALITY, CALCULATED 277 270 - 290 mOsm/kg    Narrative    Estimated Glomerular Filtration Rate (eGFR) is calculated using the CKD-EPI (2021) equation, intended for patients 136years of age and older. If gender is not documented or "unknown", there will be no eGFR calculation.   CBC/DIFF - ONCE    Narrative    The following orders were created for panel order CBC/DIFF - ONCE.  Procedure                               Abnormality         Status                     ---------                               -----------         ------                     CBC WITH DOIBB[[id number]]                Abnormal            Final result                 Please view results for these tests on the individual orders.   CBC WITH DIFF   Result Value Ref Range    WBCS UNCORRECTED 5.0 x10^3/uL    WBC 5.0 4.0 - 10.5 x10^3/uL    RBC 3.78 (L) 4.20 - 5.40 x10^6/uL    HGB 11.6 (L) 12.5 - 16.0 g/dL    HCT 34.0 (L) 37.0 - 47.0 %    MCV 90.0 78.0 - 99.0 fL    MCH 30.7 27.0 - 32.0 pg    MCHC 34.1 32.0 - 36.0 g/dL    RDW 14.2 11.6 - 14.8 %    PLATELETS 240 140 - 440 x10^3/uL    MPV 6.9 (L) 7.4 - 10.4 fL    NEUTROPHIL % 73 40 - 76 %    LYMPHOCYTE % 18 (L) 25 - 45 %    MONOCYTE % 6 0 - 12 %    EOSINOPHIL % 4 0 - 7 %    BASOPHIL % 0 0 - 3 %    NEUTROPHIL # 3.70 1.80 - 8.40 x10^3/uL    LYMPHOCYTE # 0.90 (L) 1.10 - 5.00 x10^3/uL    MONOCYTE # 0.30 0.00 - 1.30 x10^3/uL    EOSINOPHIL # 0.20 0.00 - 0.80 x10^3/uL    BASOPHIL # 0.00 0.00 - 0.30 x10^3/uL   MAGNESIUM   Result Value Ref Range    MAGNESIUM 1.6 (L) 1.9 - 2.7 mg/dL   POC BLOOD GLUCOSE (  RESULTS)   Result Value Ref Range    GLUCOSE, POC 134 50 - 500 mg/dl   POC BLOOD GLUCOSE (RESULTS)   Result Value Ref Range    GLUCOSE, POC 67 50 - 500 mg/dl   POC BLOOD GLUCOSE (RESULTS)   Result Value Ref Range    GLUCOSE, POC 57 50 - 500 mg/dl   POC BLOOD GLUCOSE (RESULTS)   Result Value Ref Range    GLUCOSE, POC 85 50 - 500 mg/dl   POC BLOOD GLUCOSE (RESULTS)   Result Value Ref Range    GLUCOSE, POC 103 50 - 500 mg/dl   POC BLOOD GLUCOSE (RESULTS)   Result Value Ref Range    GLUCOSE, POC 48 (LL) 50 - 500 mg/dl   POC BLOOD GLUCOSE (RESULTS)   Result Value Ref Range    GLUCOSE, POC 131 50 - 500 mg/dl   POC BLOOD GLUCOSE (RESULTS)   Result Value Ref Range    GLUCOSE, POC 189 50 - 500 mg/dl   POC BLOOD GLUCOSE (RESULTS)   Result Value Ref Range    GLUCOSE, POC 323 50 - 500 mg/dl   POC BLOOD GLUCOSE (RESULTS)   Result Value Ref Range    GLUCOSE, POC 272 50 - 500 mg/dl   POC BLOOD GLUCOSE (RESULTS)   Result Value Ref Range    GLUCOSE, POC 165 50 - 500 mg/dl            A/P:  1. DKA  -resolved; her blood  sugars dip lower overnight; drop back to  6/6 and regular 5 tidac; replace Mg; ok to TTF    2. AKI  -Cr 1.5 > 0.9> 0.6; resolved    3. Acute encephalopathy  -Repeat CT head 5/17 shows no evidence of new stroke  -In setting of DKA, resolved    4. DM2  -A1c 5/15: 9.6%    5. High BP  -No home med listed; hold amlodipine, BP seems to be improving    6. Hypothyroidism  -Continue Synthroid 75 mcg daily    7. T12 and L1 compression fractures  -s/p recent T12 kyphoplasty; per pt, Ortho is planning to intervene at some point on the new L1 compression fracture (dx by MRI 12/17/21)    Heparin SQ    Full code

## 2021-12-27 NOTE — Nurses Notes (Signed)
Pt sitting up in bed. No complaints voiced. No changes noted.

## 2021-12-27 NOTE — Care Plan (Signed)
Remains in CCU 7. Insulin drip off 12/26/2021. Hypoglycemic overnight. Received amp D50 x2. D5NS now infusing per order. Fingersticks q4h and prn. Vitals per ICU/ CCU policy. Fall and skin precautions in use. Education and transition readiness ongoing.     Problem: Adult Inpatient Plan of Care  Goal: Plan of Care Review  Outcome: Ongoing (see interventions/notes)  Flowsheets (Taken 12/27/2021 0647)  Progress: no change     Problem: Adult Inpatient Plan of Care  Goal: Patient-Specific Goal (Individualized)  Outcome: Ongoing (see interventions/notes)  Flowsheets  Taken 12/27/2021 0400  Individualized Care Needs: frequent accu checks  Anxieties, Fears or Concerns: anxiety r/t uncontrolled glucose levels  Patient-Specific Goals (Include Timeframe): will maintain GLU WNL throughout shift  Taken 12/27/2021 0000  Individualized Care Needs: frequent accu checks  Anxieties, Fears or Concerns: anxiety r/t uncontrolled glucose levels  Patient-Specific Goals (Include Timeframe): will maintain GLU WNL throughout shift  Taken 12/26/2021 2200  Individualized Care Needs: fequent accu checks  Anxieties, Fears or Concerns: anxiety r/t uncontrolled glucose levels  Patient-Specific Goals (Include Timeframe): will maintain GLU WNL throughout shift

## 2021-12-27 NOTE — Nurses Notes (Signed)
Pt resting with eyes closed. Arouses easily and cooperative.Makes inappropriate statements at intervals. Resp even and unlabored. No complaints voiced at this time.

## 2021-12-27 NOTE — Nurses Notes (Signed)
0400 POC GLU 48 despite receiving bedtime snack x3 and  1/2 amp of D50 prior this shift. Additional full amp D50 administered at this time and Michel Santee, NP notified of continued low GLU levels. Orders received for D5 infusion.

## 2021-12-28 LAB — CBC
HCT: 31.7 % — ABNORMAL LOW (ref 37.0–47.0)
HGB: 11 g/dL — ABNORMAL LOW (ref 12.5–16.0)
MCH: 31.1 pg (ref 27.0–32.0)
MCHC: 34.5 g/dL (ref 32.0–36.0)
MCV: 90.1 fL (ref 78.0–99.0)
MPV: 6.6 fL — ABNORMAL LOW (ref 7.4–10.4)
PLATELETS: 233 10*3/uL (ref 140–440)
RBC: 3.52 10*6/uL — ABNORMAL LOW (ref 4.20–5.40)
RDW: 14.3 % (ref 11.6–14.8)
WBC: 5.2 10*3/uL (ref 4.0–10.5)
WBCS UNCORRECTED: 5.2 10*3/uL

## 2021-12-28 LAB — BASIC METABOLIC PANEL
ANION GAP: 4 mmol/L — ABNORMAL LOW (ref 10–20)
BUN/CREA RATIO: 17 (ref 6–22)
BUN: 11 mg/dL (ref 7–25)
CALCIUM: 8.7 mg/dL (ref 8.6–10.3)
CHLORIDE: 103 mmol/L (ref 98–107)
CO2 TOTAL: 31 mmol/L (ref 21–31)
CREATININE: 0.65 mg/dL (ref 0.60–1.30)
ESTIMATED GFR: 88 mL/min/{1.73_m2} (ref >59)
GLUCOSE: 99 mg/dL (ref 74–109)
OSMOLALITY, CALCULATED: 275 mOsm/kg (ref 270–290)
POTASSIUM: 4.1 mmol/L (ref 3.5–5.1)
SODIUM: 138 mmol/L (ref 136–145)

## 2021-12-28 LAB — POC BLOOD GLUCOSE (RESULTS)
GLUCOSE, POC: 74 mg/dl (ref 50–500)
GLUCOSE, POC: 74 mg/dl (ref 50–500)
GLUCOSE, POC: 464 mg/dl (ref 50–500)
GLUCOSE, POC: 340 mg/dl (ref 50–500)
GLUCOSE, POC: 227 mg/dl (ref 50–500)
GLUCOSE, POC: 199 mg/dl (ref 50–500)

## 2021-12-28 MED ORDER — PRAMIPEXOLE 0.25 MG TABLET
0.1250 mg | ORAL_TABLET | Freq: Every evening | ORAL | Status: DC
Start: 2021-12-28 — End: 2022-01-08
  Administered 2021-12-28: 0 mg via ORAL
  Administered 2021-12-29 – 2022-01-07 (×10): 0.125 mg via ORAL
  Filled 2021-12-28 (×11): qty 1

## 2021-12-28 MED ORDER — INSULIN GLARGINE 100 UNITS/ML SUBQ - CHARGE BY DOSE
5.0000 [IU] | Freq: Every evening | SUBCUTANEOUS | Status: DC
Start: 2021-12-28 — End: 2021-12-29
  Administered 2021-12-28: 0 [IU] via SUBCUTANEOUS
  Filled 2021-12-28: qty 1

## 2021-12-28 NOTE — Care Plan (Signed)
Problem: Health Knowledge, Opportunity to Enhance (Adult,Obstetrics,Pediatric)  Goal: Knowledgeable about Health Subject/Topic  Description: Patient will demonstrate the desired outcomes by discharge/transition of care.  Outcome: Ongoing (see interventions/notes)     Problem: Adult Inpatient Plan of Care  Goal: Plan of Care Review  Outcome: Ongoing (see interventions/notes)  Goal: Patient-Specific Goal (Individualized)  Outcome: Ongoing (see interventions/notes)  Flowsheets (Taken 12/27/2021 2120)  Individualized Care Needs: assistance w/ ADLs  Anxieties, Fears or Concerns: anxious about BG management  Patient-Specific Goals (Include Timeframe): d/c soon  Goal: Absence of Hospital-Acquired Illness or Injury  Outcome: Ongoing (see interventions/notes)  Intervention: Prevent Skin Injury  Recent Flowsheet Documentation  Taken 12/28/2021 0400 by Thane Edu, RN  Body Position: positioned/repositioned independently  Taken 12/28/2021 0200 by Thane Edu, RN  Body Position: positioned/repositioned independently  Taken 12/28/2021 0000 by Thane Edu, RN  Body Position:   weight shift assistance provided   positioned/repositioned independently  Taken 12/27/2021 2200 by Thane Edu, RN  Body Position: weight shift assistance provided  Taken 12/27/2021 2000 by Thane Edu, RN  Body Position: weight shift assistance provided  Goal: Optimal Comfort and Wellbeing  Outcome: Ongoing (see interventions/notes)  Goal: Rounds/Family Conference  Outcome: Ongoing (see interventions/notes)     Problem: Fall Injury Risk  Goal: Absence of Fall and Fall-Related Injury  Outcome: Ongoing (see interventions/notes)     Problem: Diabetic Ketoacidosis  Goal: Optimal Coping  Outcome: Ongoing (see interventions/notes)  Goal: Fluid and Electrolyte Balance with Absence of Ketosis  Outcome: Ongoing (see interventions/notes)     Problem: Skin Injury Risk Increased  Goal: Skin Health and Integrity  Outcome: Ongoing (see interventions/notes)

## 2021-12-28 NOTE — Progress Notes (Signed)
Jordan Buckley    HOSPITALIST PROGRESS NOTE    Jordan Buckley  Date of service: 12/28/2021  Date of Admission:  12/23/2021  Hospital Day:  LOS: 5 days     Subjective:   Patient seen while lying in bed able to answer questions and follow commands.  Discussed with the 2 daughters at bedside.  They stated want her to go back to the facility from where she came but wanted different facility.  Denies any pain.  The patient denies chest pain, shortness of breath, abdominal pain, or nausea/vomiting.       Vital Signs:  Filed Vitals:    12/27/21 2008 12/27/21 2217 12/28/21 0002 12/28/21 0729   BP: (!) 126/58  (!) 120/43 (!) 153/72   Pulse: 97 97 (!) 104 84   Resp: '18  18 18   ' Temp: 36.6 C (97.9 F)  37.1 C (98.8 F) 36.9 C (98.4 F)   SpO2: 97%  92% 100%        Physical Exam:  General:  Patient in NAD, resting in bed, no visitors present  Head:  Normocephalic, atraumatic  Eyes:  PERRL, anicteric sclera  ENT:  Oral mucosa moist, no nasal discharge   Neck:  Soft, supple, trachea midline  Heart:  RRR, S1 and S2 normal  Lungs:  Unlabored respirations.  Lungs are clear to auscultation bilaterally, with no wheezes, no rales, no conversational dyspnea  Abdomen:  Soft, active bowel sounds, non-tender to palpation, non-distended  Extremities:  Pulses equal bilaterally.  Capillary refill less than 3 seconds.  No edema in lower extremities bilaterally   Musculoskeletal: Lower back tenderness.  Skin:  Warm and dry, not diaphoretic.  No ecchymosis noted.   Neuro:  A&O x 3.  No focal deficits.  Speech intact  Psych:  Cooperative, not agitated    Intake & Output:    Intake/Output Summary (Last 24 hours) at 12/28/2021 1034  Last data filed at 12/28/2021 0600  Gross per 24 hour   Intake 240 ml   Output 3500 ml   Net -3260 ml     I/O current shift:  No intake/output data recorded.  Emesis:    BM:    Date of Last Bowel Movement: 12/26/21  Heme:      acetaminophen (TYLENOL) tablet, 650 mg, Oral, Q4H  PRN  alcohol 62 % (NOZIN NASAL SANITIZER) nasal solution, 1 Each, Each Nostril, 2x/day  [Held by provider] amLODIPine (NORVASC) tablet, 10 mg, Oral, Daily  dextrose 50% (0.5 g/mL) injection - syringe, 25 g, Intravenous, Q15 Min PRN   Or  dextrose 50% (0.5 g/mL) injection - syringe, 12.5 g, Intravenous, Q15 Min PRN   Or  glucagon (GLUCAGEN DIAGNOSTIC KIT) injection 1 mg, 1 mg, Subcutaneous, Q15 Min PRN   Or  glucagon (GLUCAGEN DIAGNOSTIC KIT) injection 1 mg, 1 mg, IntraMUSCULAR, Q15 Min PRN  heparin 5,000 unit/mL injection, 5,000 Units, Subcutaneous, Q8HRS  HYDROcodone-acetaminophen (NORCO) 5-325 mg per tablet, 1 Tablet, Oral, Q6H PRN  insulin NPH human 100 units/mL injection, 6 Units, Subcutaneous, 2x/day  insulin R human 100 units/mL injection, 5 Units, Subcutaneous, 3x/day AC  miconazole (MONISTAT) 2% vaginal cream, 1 Applicator, Topical, 2x/day  SSIP insulin R human (HUMULIN R) 100 units/mL injection, 0-12 Units, Subcutaneous, Q4H PRN          Labs:  Recent Results (from the past 48 hour(s))   CBC WITH DIFF    Collection Time: 12/27/21  5:24 AM   Result Value  WBC 5.0    HGB 11.6 (L)    HCT 34.0 (L)    PLATELETS 240      Results for orders placed or performed during the hospital encounter of 12/23/21 (from the past 48 hour(s))   BASIC METABOLIC PANEL - AM ONCE    Collection Time: 12/28/21  3:04 AM   Result Value    SODIUM 138    POTASSIUM 4.1    CHLORIDE 103    CO2 TOTAL 31    GLUCOSE 99    BUN 11    CREATININE 0.65      No results found for this or any previous visit (from the past 48 hour(s)).   No results found for this or any previous visit (from the past 48 hour(s)).   No results found for this or any previous visit (from the past 48 hour(s)).   Results for orders placed or performed in visit on 12/23/21 (from the past 1344 hour(s))   HGA1C (HEMOGLOBIN A1C WITH EST AVG GLUCOSE)    Collection Time: 12/23/21  5:42 AM   Result Value    HEMOGLOBIN A1C 9.6 (H)      No results found for this or any previous  visit (from the past 48 hour(s)).     Microbiology:  No results found for any visits on 12/23/21 (from the past 96 hour(s)).    Imaging:   CT BRAIN WO IV CONTRAST  Narrative: Jordan Buckley    PROCEDURE DESCRIPTION: CT OF THE HEAD WITHOUT INTRAVENOUS CONTRAST    CLINICAL HISTORY: Altered mental status; slurred speech       COMPARISON: 12/23/2021    TECHNIQUE: A CT of the head was performed utilizing contiguous axial imaging. No intravenous contrast material was injected.    FINDINGS: The ventricular system, cortical sulci, and basilar cisterns appear mildly to moderately  dilated consistent with mild to moderate cerebral and cerebellar volume loss. No evidence of an acute intracranial hemorrhage is seen. No mass effect or midline shift is noted. No evidence of an acute cerebrovascular accident is seen.  Chronic small vessel ischemic changes are identified in the periventricular deep white matter and centrum semiovale..  Chronic small vessel ischemic changes are identified in the periventricular deep white matter and centrum semiovale.  The paranasal sinuses and mastoid air cells are within normal appearance.  Impression: No evidence of an acute intracranial hemorrhage is noted.    Radiologist location ID: [id number]  ECG 12 LEAD     Sinus tachycardia  Rightward axis  Septal infarct (cited on or before 16-Dec-2021)  ST & T wave abnormality, consider inferior ischemia  Abnormal ECG  When compared with ECG of 16-Dec-2021 13:36,  Vent. rate has increased BY  43 BPM  Serial changes of Septal infarct present  Confirmed by Rana, Shahid ([id number]) on 12/25/2021 8:43:27 AM  XR AP MOBILE CHEST  Narrative: Jordan Buckley    PROCEDURE DESCRIPTION: XR PORTABLE CHEST X-RAY    CLINICAL HISTORY:hypoxemia    COMPARISON:12/23/2021        FINDINGS:  A single view of the chest was obtained. No focal alveolar infiltrate is identified. The bronchovascular markings appear unchanged. The patient is status post median sternotomy. The  heart size is within limits of normal. No pleural effusion or pneumothorax is seen.    Mild elevation of the right hemidiaphragm is noted. Mild degenerative change of the mid to lower thoracic spine is seen.  Impression: No focal alveolar infiltrate is identified.    Radiologist  location ID: [id number]        Assessment/ Plan:   Active Hospital Problems   (*Primary Problem)    Diagnosis   . *DKA (diabetic ketoacidosis) (CMS HCC)   . Hyperkalemia   . Hyponatremia   . AKI (acute kidney injury) (CMS HCC)   . Acute metabolic encephalopathy   . DKA, type 2 (CMS [place])     -Type 2 diabetes mellitus with DKA  Currently DKA resolved  Continue with insulin sliding scale with Lantus and fingerstick blood sugar a.c. HS  Hemoglobin A1c 9.6 on 12/23/21    -acute kidney injury  This resolved will continue to monitor BMP    -Acute metabolic encephalopathy likely due to DKA with underlying dementia  Mental status appears to be at baseline currently.  She does not seem to be on any home medications for dementia    -Hypertension, controlled  She will be on Norvasc, to adjust as blood pressure responds    -Hypothyroidism  Continue Synthroid    -T12 and L1 compression fracture  T12 status post kyphoplasty  L1 compression fracture plan was for kyphoplasty as outpatient by IR.  Pain control    DVT prophylaxis, heparin  Code status, full code    Plan of care discussed with the daughters at bedside verbalized understanding and agreed.    Disposition Planning:  We will follow up with case management for discharge disposition planning.    Norman Herrlich, MD  12/28/2021  [place] HOSPITALIST

## 2021-12-28 NOTE — Nurses Notes (Signed)
Patient glucose 340 after admin of 9 units regular sq. Per Dr. Corie Chiquito, monitor for hyper/hypoglycemia and recheck before dinner.

## 2021-12-29 LAB — POC BLOOD GLUCOSE (RESULTS)
GLUCOSE, POC: 355 mg/dl (ref 50–500)
GLUCOSE, POC: 416 mg/dl (ref 50–500)
GLUCOSE, POC: 396 mg/dl (ref 50–500)
GLUCOSE, POC: 380 mg/dl (ref 50–500)
GLUCOSE, POC: 201 mg/dl (ref 50–500)

## 2021-12-29 LAB — CBC WITH DIFF
BASOPHIL #: 0 10*3/uL (ref 0.00–0.30)
BASOPHIL %: 1 % (ref 0–3)
EOSINOPHIL #: 0.2 10*3/uL (ref 0.00–0.80)
EOSINOPHIL %: 3 % (ref 0–7)
HCT: 31.1 % — ABNORMAL LOW (ref 37.0–47.0)
HGB: 10.5 g/dL — ABNORMAL LOW (ref 12.5–16.0)
LYMPHOCYTE #: 1.1 10*3/uL (ref 1.10–5.00)
LYMPHOCYTE %: 23 % — ABNORMAL LOW (ref 25–45)
MCH: 30.7 pg (ref 27.0–32.0)
MCHC: 33.7 g/dL (ref 32.0–36.0)
MCV: 91.1 fL (ref 78.0–99.0)
MONOCYTE #: 0.3 10*3/uL (ref 0.00–1.30)
MONOCYTE %: 7 % (ref 0–12)
MPV: 6.7 fL — ABNORMAL LOW (ref 7.4–10.4)
NEUTROPHIL #: 3.1 10*3/uL (ref 1.80–8.40)
NEUTROPHIL %: 66 % (ref 40–76)
PLATELETS: 299 10*3/uL (ref 140–440)
RBC: 3.42 10*6/uL — ABNORMAL LOW (ref 4.20–5.40)
RDW: 14.1 % (ref 11.6–14.8)
WBC: 4.7 10*3/uL (ref 4.0–10.5)
WBCS UNCORRECTED: 4.7 10*3/uL

## 2021-12-29 LAB — BASIC METABOLIC PANEL
ANION GAP: 7 mmol/L — ABNORMAL LOW (ref 10–20)
BUN/CREA RATIO: 17 (ref 6–22)
BUN: 11 mg/dL (ref 7–25)
CALCIUM: 8.8 mg/dL (ref 8.6–10.3)
CHLORIDE: 96 mmol/L — ABNORMAL LOW (ref 98–107)
CO2 TOTAL: 32 mmol/L — ABNORMAL HIGH (ref 21–31)
CREATININE: 0.64 mg/dL (ref 0.60–1.30)
ESTIMATED GFR: 89 mL/min/{1.73_m2} (ref >59)
GLUCOSE: 362 mg/dL — ABNORMAL HIGH (ref 74–109)
OSMOLALITY, CALCULATED: 284 mOsm/kg (ref 270–290)
POTASSIUM: 4.4 mmol/L (ref 3.5–5.1)
SODIUM: 135 mmol/L — ABNORMAL LOW (ref 136–145)

## 2021-12-29 LAB — MAGNESIUM: MAGNESIUM: 1.3 mg/dL — ABNORMAL LOW (ref 1.9–2.7)

## 2021-12-29 MED ORDER — INSULIN GLARGINE 100 UNITS/ML SUBQ - CHARGE BY DOSE
8.0000 [IU] | Freq: Every evening | SUBCUTANEOUS | Status: DC
Start: 2021-12-29 — End: 2022-01-07
  Administered 2021-12-29 – 2022-01-06 (×9): 8 [IU] via SUBCUTANEOUS
  Filled 2021-12-29 (×6): qty 8
  Filled 2021-12-29: qty 1
  Filled 2021-12-29 (×2): qty 8

## 2021-12-29 MED ORDER — INSULIN REGULAR HUMAN 100 UNIT/ML INJECTION SSIP
0.0000 [IU] | INJECTION | SUBCUTANEOUS | Status: DC | PRN
Start: 2021-12-29 — End: 2022-01-06
  Administered 2021-12-29: 9 [IU] via SUBCUTANEOUS
  Administered 2021-12-30 (×2): 3 [IU] via SUBCUTANEOUS
  Administered 2021-12-30 – 2022-01-01 (×4): 9 [IU] via SUBCUTANEOUS
  Administered 2022-01-01: 6 [IU] via SUBCUTANEOUS
  Administered 2022-01-02 – 2022-01-03 (×3): 9 [IU] via SUBCUTANEOUS
  Administered 2022-01-03: 6 [IU] via SUBCUTANEOUS
  Administered 2022-01-03: 9 [IU] via SUBCUTANEOUS
  Administered 2022-01-04: 0 [IU] via SUBCUTANEOUS
  Administered 2022-01-04: 6 [IU] via SUBCUTANEOUS
  Administered 2022-01-04: 9 [IU] via SUBCUTANEOUS
  Administered 2022-01-04: 3 [IU] via SUBCUTANEOUS
  Administered 2022-01-04: 9 [IU] via SUBCUTANEOUS
  Administered 2022-01-05 (×2): 2 [IU] via SUBCUTANEOUS
  Filled 2021-12-29 (×4): qty 27
  Filled 2021-12-29: qty 9
  Filled 2021-12-29: qty 27
  Filled 2021-12-29: qty 6
  Filled 2021-12-29: qty 18
  Filled 2021-12-29: qty 6
  Filled 2021-12-29: qty 27
  Filled 2021-12-29: qty 18
  Filled 2021-12-29 (×3): qty 27
  Filled 2021-12-29: qty 9
  Filled 2021-12-29 (×3): qty 27
  Filled 2021-12-29: qty 9
  Filled 2021-12-29: qty 18

## 2021-12-29 MED ORDER — INSULIN GLARGINE 100 UNITS/ML SUBQ - CHARGE BY DOSE
8.0000 [IU] | Freq: Once | SUBCUTANEOUS | Status: AC
Start: 2021-12-29 — End: 2021-12-29
  Administered 2021-12-29: 8 [IU] via SUBCUTANEOUS
  Filled 2021-12-29: qty 8

## 2021-12-29 NOTE — Progress Notes (Signed)
[facility]    HOSPITALIST PROGRESS NOTE    Jordan Buckley  Date of service: 12/29/2021  Date of Admission:  12/23/2021  Hospital Day:  LOS: 6 days     Subjective:   Patient comfortably lying in bed able to follow commands but not answering all questions.  No new complaints reported.  Denies any pain.  The patient denies chest pain, shortness of breath, abdominal pain, or nausea/vomiting.       Vital Signs:  Filed Vitals:    12/28/21 1900 12/28/21 2230 12/28/21 2321 12/29/21 0816   BP: (!) 112/56  (!) 151/71 (!) 131/58   Pulse: 95 85 80 89   Resp: _0 Temp: 37.1 C (98.7 F)  37.2 C (99 F) 36.6 C (97.9 F)   SpO2: 94%  99% 96%        Physical Exam:  General:  Patient in NAD, resting in bed, no visitors present  Head:  Normocephalic, atraumatic  Eyes:  PERRL, anicteric sclera  ENT:  Oral mucosa moist, no nasal discharge   Neck:  Soft, supple, trachea midline  Heart:  RRR, S1 and S2 normal  Lungs:  Unlabored respirations.  Lungs are clear to auscultation bilaterally, with no wheezes, no rales, no conversational dyspnea  Abdomen:  Soft, active bowel sounds, non-tender to palpation, non-distended  Extremities:  Pulses equal bilaterally.  Capillary refill less than 3 seconds.  No edema in lower extremities bilaterally   Musculoskeletal: Lower back tenderness.  Skin:  Warm and dry, not diaphoretic.  No ecchymosis noted.   Neuro:  A&O x 3.  No focal deficits.  Speech intact  Psych:  Cooperative, not agitated    Intake & Output:    Intake/Output Summary (Last 24 hours) at 12/29/2021 1100  Last data filed at 12/29/2021 0500  Gross per 24 hour   Intake 580 ml   Output 3375 ml   Net -2795 ml     I/O current shift:  No intake/output data recorded.  Emesis:    BM:    Date of Last Bowel Movement: 12/28/21  Heme:      acetaminophen (TYLENOL) tablet, 650 mg, Oral, Q4H PRN  alcohol 62 % (NOZIN NASAL SANITIZER) nasal solution, 1 Each, Each Nostril, 2x/day  amLODIPine (NORVASC) tablet, 10  mg, Oral, Daily  dextrose 50% (0.5 g/mL) injection - syringe, 25 g, Intravenous, Q15 Min PRN   Or  dextrose 50% (0.5 g/mL) injection - syringe, 12.5 g, Intravenous, Q15 Min PRN   Or  glucagon (GLUCAGEN DIAGNOSTIC KIT) injection 1 mg, 1 mg, Subcutaneous, Q15 Min PRN   Or  glucagon (GLUCAGEN DIAGNOSTIC KIT) injection 1 mg, 1 mg, IntraMUSCULAR, Q15 Min PRN  heparin 5,000 unit/mL injection, 5,000 Units, Subcutaneous, Q8HRS  HYDROcodone-acetaminophen (NORCO) 5-325 mg per tablet, 1 Tablet, Oral, Q6H PRN  insulin glargine 100 units/mL injection, 8 Units, Subcutaneous, NIGHTLY  miconazole (MONISTAT) 2% vaginal cream, 1 Applicator, Topical, 2x/day  pramipexole (MIRAPEX) tablet, 0.125 mg, Oral, QPM  SSIP insulin R human (HUMULIN R) 100 units/mL injection, 0-12 Units, Subcutaneous, Q4H PRN          Labs:  Recent Results (from the past 48 hour(s))   CBC WITH DIFF    Collection Time: 12/29/21  3:14 AM   Result Value    WBC 4.7    HGB 10.5 (L)    HCT 31.1 (L)    PLATELETS 299      Results for orders placed or performed  during the hospital encounter of 12/23/21 (from the past 48 hour(s))   BASIC METABOLIC PANEL    Collection Time: 12/29/21  3:14 AM   Result Value    SODIUM 135 (L)    POTASSIUM 4.4    CHLORIDE 96 (L)    CO2 TOTAL 32 (H)    GLUCOSE 362 (H)    BUN 11    CREATININE 0.64      No results found for this or any previous visit (from the past 48 hour(s)).   No results found for this or any previous visit (from the past 48 hour(s)).   No results found for this or any previous visit (from the past 48 hour(s)).   Results for orders placed or performed in visit on 12/23/21 (from the past 1344 hour(s))   HGA1C (HEMOGLOBIN A1C WITH EST AVG GLUCOSE)    Collection Time: 12/23/21  5:42 AM   Result Value    HEMOGLOBIN A1C 9.6 (H)      No results found for this or any previous visit (from the past 48 hour(s)).     Microbiology:  No results found for any visits on 12/23/21 (from the past 96 hour(s)).    Imaging:   CT BRAIN WO IV  CONTRAST  Narrative: Jordan Buckley    PROCEDURE DESCRIPTION: CT OF THE HEAD WITHOUT INTRAVENOUS CONTRAST    CLINICAL HISTORY: Altered mental status; slurred speech       COMPARISON: 12/23/2021    TECHNIQUE: A CT of the head was performed utilizing contiguous axial imaging. No intravenous contrast material was injected.    FINDINGS: The ventricular system, cortical sulci, and basilar cisterns appear mildly to moderately  dilated consistent with mild to moderate cerebral and cerebellar volume loss. No evidence of an acute intracranial hemorrhage is seen. No mass effect or midline shift is noted. No evidence of an acute cerebrovascular accident is seen.  Chronic small vessel ischemic changes are identified in the periventricular deep white matter and centrum semiovale..  Chronic small vessel ischemic changes are identified in the periventricular deep white matter and centrum semiovale.  The paranasal sinuses and mastoid air cells are within normal appearance.  Impression: No evidence of an acute intracranial hemorrhage is noted.    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 rate has increased BY  43 BPM  Serial changes of Septal infarct present  Confirmed by Rana, Shahid ([id number]) on 12/25/2021 8:43:27 AM  XR AP MOBILE CHEST  Narrative: Jordan Buckley    PROCEDURE DESCRIPTION: XR PORTABLE CHEST X-RAY    CLINICAL HISTORY:hypoxemia    COMPARISON:12/23/2021        FINDINGS:  A single view of the chest was obtained. No focal alveolar infiltrate is identified. The bronchovascular markings appear unchanged. The patient is status post median sternotomy. The heart size is within limits of normal. No pleural effusion or pneumothorax is seen.    Mild elevation of the right hemidiaphragm is noted. Mild degenerative  change of the mid to lower thoracic spine is seen.  Impression: No focal alveolar infiltrate is identified.    Radiologist location ID: [id number]        Assessment/ Plan:   Active Hospital Problems   (*Primary Problem)    Diagnosis   . *DKA (  diabetic ketoacidosis) (CMS HCC)   . Hyperkalemia   . Hyponatremia   . AKI (acute kidney injury) (CMS HCC)   . Acute metabolic encephalopathy   . DKA, type 2 (CMS [place])     -Type 2 diabetes mellitus with DKA  Currently DKA resolved  Continue with Lantus and insulin sliding scale with Lantus and fingerstick blood sugar a.c. HS  Hemoglobin A1c 9.6 on 12/23/21    -acute kidney injury  This resolved will continue to monitor BMP    -Acute metabolic encephalopathy likely due to DKA with underlying dementia  Mental status appears to be at baseline currently.  She does not seem to be on any home medications for dementia    -Hypertension, controlled  She will be on Norvasc, to adjust as blood pressure responds    -Hypothyroidism  Continue Synthroid    -T12 and L1 compression fracture  T12 status post kyphoplasty  L1 compression fracture plan was for kyphoplasty as outpatient by IR.  Pain control    DVT prophylaxis, heparin  Code status, full code    Plan of care discussed with the daughters at bedside verbalized understanding and agreed.  They report that they want her to go to a different facility not where she came from.    Disposition Planning:  We will follow up with case management for discharge disposition planning.    Norman Herrlich, MD  12/29/2021  [facility] MEDICINE HOSPITALIST

## 2021-12-30 DIAGNOSIS — Z751 Person awaiting admission to adequate facility elsewhere: Secondary | ICD-10-CM

## 2021-12-30 LAB — CBC WITH DIFF
BASOPHIL #: 0 10*3/uL (ref 0.00–0.30)
BASOPHIL %: 1 % (ref 0–3)
EOSINOPHIL #: 0.2 10*3/uL (ref 0.00–0.80)
EOSINOPHIL %: 4 % (ref 0–7)
HCT: 31.8 % — ABNORMAL LOW (ref 37.0–47.0)
HGB: 10.8 g/dL — ABNORMAL LOW (ref 12.5–16.0)
LYMPHOCYTE #: 1.6 10*3/uL (ref 1.10–5.00)
LYMPHOCYTE %: 23 % — ABNORMAL LOW (ref 25–45)
MCH: 30.7 pg (ref 27.0–32.0)
MCHC: 33.9 g/dL (ref 32.0–36.0)
MCV: 90.6 fL (ref 78.0–99.0)
MONOCYTE #: 0.6 10*3/uL (ref 0.00–1.30)
MONOCYTE %: 9 % (ref 0–12)
MPV: 7.9 fL (ref 7.4–10.4)
NEUTROPHIL #: 4.3 10*3/uL (ref 1.80–8.40)
NEUTROPHIL %: 64 % (ref 40–76)
PLATELET COMMENT: NORMAL
PLATELETS: 318 10*3/uL (ref 140–440)
RBC: 3.51 10*6/uL — ABNORMAL LOW (ref 4.20–5.40)
RDW: 14.5 % (ref 11.6–14.8)
WBC: 6.7 10*3/uL (ref 4.0–10.5)
WBCS UNCORRECTED: 7.1 10*3/uL

## 2021-12-30 LAB — MAGNESIUM: MAGNESIUM: 1.4 mg/dL — ABNORMAL LOW (ref 1.9–2.7)

## 2021-12-30 LAB — BASIC METABOLIC PANEL
ANION GAP: 5 mmol/L — ABNORMAL LOW (ref 10–20)
BUN/CREA RATIO: 22 (ref 6–22)
BUN: 15 mg/dL (ref 7–25)
CALCIUM: 9.2 mg/dL (ref 8.6–10.3)
CHLORIDE: 96 mmol/L — ABNORMAL LOW (ref 98–107)
CO2 TOTAL: 35 mmol/L — ABNORMAL HIGH (ref 21–31)
CREATININE: 0.69 mg/dL (ref 0.60–1.30)
ESTIMATED GFR: 87 mL/min/{1.73_m2} (ref >59)
GLUCOSE: 161 mg/dL — ABNORMAL HIGH (ref 74–109)
OSMOLALITY, CALCULATED: 276 mOsm/kg (ref 270–290)
POTASSIUM: 3.9 mmol/L (ref 3.5–5.1)
SODIUM: 136 mmol/L (ref 136–145)

## 2021-12-30 LAB — POC BLOOD GLUCOSE (RESULTS)
GLUCOSE, POC: 156 mg/dl (ref 50–500)
GLUCOSE, POC: 100 mg/dl (ref 50–500)
GLUCOSE, POC: 325 mg/dl (ref 50–500)
GLUCOSE, POC: 196 mg/dl (ref 50–500)
GLUCOSE, POC: 157 mg/dl (ref 50–500)

## 2021-12-30 MED ORDER — DOCUSATE SODIUM 100 MG CAPSULE
100.0000 mg | ORAL_CAPSULE | Freq: Every day | ORAL | Status: DC
Start: 2021-12-30 — End: 2022-01-08
  Administered 2021-12-30 – 2021-12-31 (×2): 0 mg via ORAL
  Administered 2022-01-01 – 2022-01-08 (×8): 100 mg via ORAL
  Filled 2021-12-30 (×9): qty 1

## 2021-12-30 MED ORDER — MAGNESIUM OXIDE 400 MG (241.3 MG MAGNESIUM) TABLET
400.0000 mg | ORAL_TABLET | Freq: Two times a day (BID) | ORAL | Status: DC
Start: 2021-12-30 — End: 2022-01-08
  Administered 2021-12-30 – 2022-01-05 (×13): 400 mg via ORAL
  Administered 2022-01-05: 0 mg via ORAL
  Administered 2022-01-06 – 2022-01-08 (×5): 400 mg via ORAL
  Filled 2021-12-30 (×18): qty 1

## 2021-12-30 MED ORDER — APIXABAN 5 MG TABLET
2.5000 mg | ORAL_TABLET | Freq: Two times a day (BID) | ORAL | Status: DC
Start: 2021-12-30 — End: 2022-01-08
  Administered 2021-12-30 – 2022-01-08 (×19): 2.5 mg via ORAL
  Filled 2021-12-30 (×19): qty 1

## 2021-12-30 MED ORDER — LEVOTHYROXINE 150 MCG TABLET
75.0000 ug | ORAL_TABLET | Freq: Every morning | ORAL | Status: DC
Start: 2021-12-30 — End: 2022-01-08
  Administered 2021-12-30 – 2022-01-08 (×10): 75 ug via ORAL
  Filled 2021-12-30 (×10): qty 1

## 2021-12-30 MED ORDER — FERROUS SULFATE 324 MG (65 MG IRON) TABLET,DELAYED RELEASE
324.0000 mg | DELAYED_RELEASE_TABLET | Freq: Every morning | ORAL | Status: DC
Start: 2021-12-30 — End: 2022-01-08
  Administered 2021-12-30 – 2022-01-08 (×10): 324 mg via ORAL
  Filled 2021-12-30 (×10): qty 1

## 2021-12-30 MED ORDER — ATORVASTATIN 20 MG TABLET
20.0000 mg | ORAL_TABLET | Freq: Every evening | ORAL | Status: DC
Start: 2021-12-30 — End: 2022-01-08
  Administered 2021-12-30 – 2022-01-07 (×9): 20 mg via ORAL
  Filled 2021-12-30 (×9): qty 1

## 2021-12-30 MED ORDER — ONDANSETRON 4 MG DISINTEGRATING TABLET
4.0000 mg | ORAL_TABLET | Freq: Three times a day (TID) | ORAL | Status: DC | PRN
Start: 2021-12-30 — End: 2022-01-08

## 2021-12-30 NOTE — Care Management Notes (Signed)
[facility] PAS APPLICATION  RECORD ID # [id number]    KEPRO REVIEW COMPLETE  LEVEL 1 APPROVED  LEVEL 2 NOT REQUIRED

## 2021-12-30 NOTE — Nurses Notes (Signed)
Patient rechecked around 0100 for POCBG.   89 was the recheck. This nurse accidentally rejected the accucheck, so it will not display in the labs.     Patient educated about carbs and protein together HS. Patient stated that they knew this.    Patient given two cups of yogurt provided by the nutritional services to get ahead of any hypoglycemia episodes.    Will continue to monitor patient.

## 2021-12-30 NOTE — Care Management Notes (Signed)
[facility] PAS APPLICATION  RECORD ID #:  [id number]    LEVEL 1 APPROVED    PENDING REVIEW

## 2021-12-30 NOTE — Care Plan (Signed)
Problem: Adult Inpatient Plan of Care  Goal: Optimal Comfort and Wellbeing  Outcome: Ongoing (see interventions/notes)     Problem: Fall Injury Risk  Goal: Absence of Fall and Fall-Related Injury  Outcome: Ongoing (see interventions/notes)  Intervention: Promote Freight forwarder Documentation  Taken 12/30/2021 0900 by Hollice Espy, RN  Safety Promotion/Fall Prevention:   safety round/check completed   fall prevention program maintained     Problem: Diabetic Ketoacidosis  Goal: Optimal Coping  Outcome: Ongoing (see interventions/notes)  Goal: Fluid and Electrolyte Balance with Absence of Ketosis  Outcome: Ongoing (see interventions/notes)     Problem: Skin Injury Risk Increased  Goal: Skin Health and Integrity  Outcome: Ongoing (see interventions/notes)     Patient resting in bed at this time. Oriented to person, place, and situation. Turned q2h and PRN. Barrier cream in use to buttocks and groin. Bed alarm on and functioning. Accucheck q4h.

## 2021-12-30 NOTE — Progress Notes (Signed)
Jordan Buckley    HOSPITALIST PROGRESS NOTE    Mariel Sleet Martorana  Date of service: 12/30/2021  Date of Admission:  12/23/2021  Hospital Day:  LOS: 7 days     Subjective:   Patient resting comfortably in bed.  She is following commands the reluctant to answer questions.  No new complaints reported by nursing staff.       Vital Signs:  Filed Vitals:    12/30/21 0104 12/30/21 0716 12/30/21 0800 12/30/21 1002   BP: (!) 113/58  123/65    Pulse: 87  84 90   Resp: _0 Temp: 37 C (98.6 F)  36.8 C (98.3 F)    SpO2: 94%  96%         Physical Exam:  General:  Patient in NAD, resting in bed, no visitors present  Head:  Normocephalic, atraumatic  Eyes:  PERRL, anicteric sclera  ENT:  Oral mucosa moist, no nasal discharge   Neck:  Soft, supple, trachea midline  Heart:  RRR, S1 and S2 normal  Lungs:  Unlabored respirations.  Lungs are clear to auscultation bilaterally, with no wheezes, no rales, no conversational dyspnea  Abdomen:  Soft, active bowel sounds, non-tender to palpation, non-distended  Extremities:  Pulses equal bilaterally.  Capillary refill less than 3 seconds.  No edema in lower extremities bilaterally   Musculoskeletal: Lower back tenderness.  Skin:  Warm and dry, not diaphoretic.  No ecchymosis noted.   Neuro:  A&O x 3.  No focal deficits.  Speech intact  Psych:  Cooperative, not agitated    Intake & Output:    Intake/Output Summary (Last 24 hours) at 12/30/2021 1318  Last data filed at 12/30/2021 0948  Gross per 24 hour   Intake 720 ml   Output 2600 ml   Net -1880 ml     I/O current shift:  05/22 0700 - 05/22 1859  In: 240 [P.O.:240]  Out: -   Emesis:    BM:    Date of Last Bowel Movement: 12/29/21  Heme:      acetaminophen (TYLENOL) tablet, 650 mg, Oral, Q4H PRN  alcohol 62 % (NOZIN NASAL SANITIZER) nasal solution, 1 Each, Each Nostril, 2x/day  amLODIPine (NORVASC) tablet, 10 mg, Oral, Daily  apixaban (ELIQUIS) tablet, 2.5 mg, Oral, 2x/day  atorvastatin (LIPITOR) tablet, 20  mg, Oral, QPM  dextrose 50% (0.5 g/mL) injection - syringe, 25 g, Intravenous, Q15 Min PRN   Or  dextrose 50% (0.5 g/mL) injection - syringe, 12.5 g, Intravenous, Q15 Min PRN   Or  glucagon (GLUCAGEN DIAGNOSTIC KIT) injection 1 mg, 1 mg, Subcutaneous, Q15 Min PRN   Or  glucagon (GLUCAGEN DIAGNOSTIC KIT) injection 1 mg, 1 mg, IntraMUSCULAR, Q15 Min PRN  docusate sodium (COLACE) capsule, 100 mg, Oral, Daily  ferrous sulfate 324 mg (65 mg elemental IRON) tablet, 324 mg, Oral, Daily before Breakfast  HYDROcodone-acetaminophen (NORCO) 5-325 mg per tablet, 1 Tablet, Oral, Q6H PRN  insulin glargine 100 units/mL injection, 8 Units, Subcutaneous, NIGHTLY  levothyroxine (SYNTHROID) tablet, 75 mcg, Oral, QAM  magnesium oxide (MAG-OX) 450m (2439melemental magnesium) tablet, 400 mg, Oral, 2x/day  miconazole (MONISTAT) 2% vaginal cream, 1 Applicator, Topical, 2x/day  ondansetron (ZOFRAN ODT) rapid dissolve tablet, 4 mg, Oral, Q8H PRN  pramipexole (MIRAPEX) tablet, 0.125 mg, Oral, QPM  SSIP insulin R human (HUMULIN R) 100 units/mL injection, 0-12 Units, Subcutaneous, Q4H PRN          Labs:  Recent Results (  from the past 48 hour(s))   CBC WITH DIFF    Collection Time: 12/30/21  3:20 AM   Result Value    WBC 6.7    HGB 10.8 (L)    HCT 31.8 (L)    PLATELETS 318      Results for orders placed or performed during the hospital encounter of 12/23/21 (from the past 48 hour(s))   BASIC METABOLIC PANEL    Collection Time: 12/30/21  3:20 AM   Result Value    SODIUM 136    POTASSIUM 3.9    CHLORIDE 96 (L)    CO2 TOTAL 35 (H)    GLUCOSE 161 (H)    BUN 15    CREATININE 0.69      No results found for this or any previous visit (from the past 48 hour(s)).   No results found for this or any previous visit (from the past 48 hour(s)).   No results found for this or any previous visit (from the past 48 hour(s)).   Results for orders placed or performed in visit on 12/23/21 (from the past 1344 hour(s))   HGA1C (HEMOGLOBIN A1C WITH EST AVG GLUCOSE)     Collection Time: 12/23/21  5:42 AM   Result Value    HEMOGLOBIN A1C 9.6 (H)      No results found for this or any previous visit (from the past 48 hour(s)).     Microbiology:  No results found for any visits on 12/23/21 (from the past 96 hour(s)).    Imaging:   CT BRAIN WO IV CONTRAST  Narrative: Jordan Buckley    PROCEDURE DESCRIPTION: CT OF THE HEAD WITHOUT INTRAVENOUS CONTRAST    CLINICAL HISTORY: Altered mental status; slurred speech       COMPARISON: 12/23/2021    TECHNIQUE: A CT of the head was performed utilizing contiguous axial imaging. No intravenous contrast material was injected.    FINDINGS: The ventricular system, cortical sulci, and basilar cisterns appear mildly to moderately  dilated consistent with mild to moderate cerebral and cerebellar volume loss. No evidence of an acute intracranial hemorrhage is seen. No mass effect or midline shift is noted. No evidence of an acute cerebrovascular accident is seen.  Chronic small vessel ischemic changes are identified in the periventricular deep white matter and centrum semiovale..  Chronic small vessel ischemic changes are identified in the periventricular deep white matter and centrum semiovale.  The paranasal sinuses and mastoid air cells are within normal appearance.  Impression: No evidence of an acute intracranial hemorrhage is noted.    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 rate has increased BY  43 BPM  Serial changes of Septal infarct present  Confirmed by Rana, Shahid ([id number]) on 12/25/2021 8:43:27 AM  XR AP MOBILE CHEST  Narrative: Jordan Buckley    PROCEDURE DESCRIPTION: XR PORTABLE CHEST X-RAY    CLINICAL HISTORY:hypoxemia    COMPARISON:12/23/2021        FINDINGS:  A single view of the chest was obtained. No focal alveolar  infiltrate is identified. The bronchovascular markings appear unchanged. The patient is status post median sternotomy. The heart size is within limits of normal. No pleural effusion or pneumothorax is seen.    Mild elevation of the right hemidiaphragm  is noted. Mild degenerative change of the mid to lower thoracic spine is seen.  Impression: No focal alveolar infiltrate is identified.    Radiologist location ID: [id number]        Assessment/ Plan:   Active Hospital Problems   (*Primary Problem)    Diagnosis   . *DKA (diabetic ketoacidosis) (CMS HCC)   . Hyperkalemia   . Hyponatremia   . AKI (acute kidney injury) (CMS HCC)   . Acute metabolic encephalopathy   . DKA, type 2 (CMS [place])     -Type 2 diabetes mellitus with DKA  Currently DKA resolved  Continue with Lantus and insulin sliding scale with Lantus and fingerstick blood sugar a.c. HS  Hemoglobin A1c 9.6 on 12/23/21    -acute kidney injury  This resolved will continue to monitor BMP    -Acute metabolic encephalopathy likely due to DKA with underlying dementia  Mental status appears to be at baseline currently.  She does not seem to be on any home medications for dementia    -Hypertension, controlled  She will be on Norvasc, to adjust as blood pressure responds    -Hypothyroidism  Continue Synthroid    -T12 and L1 compression fracture  T12 status post kyphoplasty  L1 compression fracture plan was for kyphoplasty as outpatient by IR.  Pain control    DVT prophylaxis, heparin  Code status, full code    Plan of care discussed with the daughters at bedside verbalized understanding and agreed.  They report that they want her to go to a different facility not where she came from.    Disposition Planning:  We will follow up with case management for discharge disposition planning.  Awaiting placement.    Jordan Herrlich, MD  12/30/2021  [facility] MEDICINE HOSPITALIST

## 2021-12-30 NOTE — Care Management Notes (Signed)
CM spoke with Pt's daughter/MPOA. The daughter does not want Pt to go back to [facility]. The daughter would like for Pt to go to [facility]. The daughter is not opposed to the Pt going to [facility]. Transfer PAS completed, submitted for review.

## 2021-12-31 LAB — BASIC METABOLIC PANEL
ANION GAP: 4 mmol/L — ABNORMAL LOW (ref 10–20)
BUN/CREA RATIO: 21 (ref 6–22)
BUN: 16 mg/dL (ref 7–25)
CALCIUM: 9 mg/dL (ref 8.6–10.3)
CHLORIDE: 95 mmol/L — ABNORMAL LOW (ref 98–107)
CO2 TOTAL: 35 mmol/L — ABNORMAL HIGH (ref 21–31)
CREATININE: 0.75 mg/dL (ref 0.60–1.30)
ESTIMATED GFR: 80 mL/min/{1.73_m2} (ref >59)
GLUCOSE: 281 mg/dL — ABNORMAL HIGH (ref 74–109)
OSMOLALITY, CALCULATED: 280 mOsm/kg (ref 270–290)
POTASSIUM: 4.6 mmol/L (ref 3.5–5.1)
SODIUM: 134 mmol/L — ABNORMAL LOW (ref 136–145)

## 2021-12-31 LAB — POC BLOOD GLUCOSE (RESULTS)
GLUCOSE, POC: 165 mg/dl (ref 50–500)
GLUCOSE, POC: 197 mg/dl (ref 50–500)
GLUCOSE, POC: 207 mg/dl (ref 50–500)
GLUCOSE, POC: 371 mg/dl (ref 50–500)
GLUCOSE, POC: 376 mg/dl (ref 50–500)
GLUCOSE, POC: 46 mg/dl — CL (ref 50–500)
GLUCOSE, POC: 52 mg/dl (ref 50–500)
GLUCOSE, POC: 73 mg/dl (ref 50–500)

## 2021-12-31 LAB — MAGNESIUM: MAGNESIUM: 1.6 mg/dL — ABNORMAL LOW (ref 1.9–2.7)

## 2021-12-31 MED ORDER — MAGNESIUM SULFATE 1 GRAM/100 ML IN DEXTROSE 5 % INTRAVENOUS PIGGYBACK
1.0000 g | INJECTION | INTRAVENOUS | Status: AC
Start: 2021-12-31 — End: 2021-12-31
  Administered 2021-12-31: 0 g via INTRAVENOUS
  Administered 2021-12-31: 1 g via INTRAVENOUS
  Administered 2021-12-31: 0 g via INTRAVENOUS
  Administered 2021-12-31: 1 g via INTRAVENOUS
  Filled 2021-12-31 (×2): qty 100

## 2021-12-31 NOTE — Progress Notes (Signed)
Jordan Buckley    HOSPITALIST PROGRESS NOTE    Jordan Buckley  Date of service: 12/31/2021  Date of Admission:  12/23/2021  Hospital Day:  LOS: 8 days     Subjective:   Patient lying in bed comfortably denying chest pain shortness a breath or any other complaints.  She is able to answer some questions and follow commands when woken up.        Vital Signs:  Filed Vitals:    12/30/21 1900 12/30/21 2237 12/31/21 0037 12/31/21 0751   BP: (!) 119/54  116/67 (!) 93/55   Pulse: 92 89 86 93   Resp: _0 Temp: 36.9 C (98.4 F)  37 C (98.6 F) 36.5 C (97.7 F)   SpO2: 96%  93% 92%        Physical Exam:  General:  Patient in NAD, resting in bed, no visitors present  Head:  Normocephalic, atraumatic  Eyes:  PERRL, anicteric sclera  ENT:  Oral mucosa moist, no nasal discharge   Neck:  Soft, supple, trachea midline  Heart:  RRR, S1 and S2 normal  Lungs:  Unlabored respirations.  Lungs are clear to auscultation bilaterally, with no wheezes, no rales, no conversational dyspnea  Abdomen:  Soft, active bowel sounds, non-tender to palpation, non-distended  Extremities:  Pulses equal bilaterally.  Capillary refill less than 3 seconds.  No edema in lower extremities bilaterally   Musculoskeletal: Lower back tenderness.  Skin:  Warm and dry, not diaphoretic.  No ecchymosis noted.   Neuro:  A&O x 3.  No focal deficits.  Speech intact  Psych:  Cooperative, not agitated    Intake & Output:    Intake/Output Summary (Last 24 hours) at 12/31/2021 1009  Last data filed at 12/31/2021 0500  Gross per 24 hour   Intake 840 ml   Output 1725 ml   Net -885 ml     I/O current shift:  No intake/output data recorded.  Emesis:    BM:    Date of Last Bowel Movement: 12/29/21  Heme:      acetaminophen (TYLENOL) tablet, 650 mg, Oral, Q4H PRN  alcohol 62 % (NOZIN NASAL SANITIZER) nasal solution, 1 Each, Each Nostril, 2x/day  amLODIPine (NORVASC) tablet, 10 mg, Oral, Daily  apixaban (ELIQUIS) tablet, 2.5 mg, Oral,  2x/day  atorvastatin (LIPITOR) tablet, 20 mg, Oral, QPM  dextrose 50% (0.5 g/mL) injection - syringe, 25 g, Intravenous, Q15 Min PRN   Or  dextrose 50% (0.5 g/mL) injection - syringe, 12.5 g, Intravenous, Q15 Min PRN   Or  glucagon (GLUCAGEN DIAGNOSTIC KIT) injection 1 mg, 1 mg, Subcutaneous, Q15 Min PRN   Or  glucagon (GLUCAGEN DIAGNOSTIC KIT) injection 1 mg, 1 mg, IntraMUSCULAR, Q15 Min PRN  docusate sodium (COLACE) capsule, 100 mg, Oral, Daily  ferrous sulfate 324 mg (65 mg elemental IRON) tablet, 324 mg, Oral, Daily before Breakfast  HYDROcodone-acetaminophen (NORCO) 5-325 mg per tablet, 1 Tablet, Oral, Q6H PRN  insulin glargine 100 units/mL injection, 8 Units, Subcutaneous, NIGHTLY  levothyroxine (SYNTHROID) tablet, 75 mcg, Oral, QAM  magnesium oxide (MAG-OX) 441m (2428melemental magnesium) tablet, 400 mg, Oral, 2x/day  miconazole (MONISTAT) 2% vaginal cream, 1 Applicator, Topical, 2x/day  ondansetron (ZOFRAN ODT) rapid dissolve tablet, 4 mg, Oral, Q8H PRN  pramipexole (MIRAPEX) tablet, 0.125 mg, Oral, QPM  SSIP insulin R human (HUMULIN R) 100 units/mL injection, 0-12 Units, Subcutaneous, Q4H PRN          Labs:  Recent Results (from the past 48 hour(s))   CBC WITH DIFF    Collection Time: 12/30/21  3:20 AM   Result Value    WBC 6.7    HGB 10.8 (L)    HCT 31.8 (L)    PLATELETS 318      Results for orders placed or performed during the hospital encounter of 12/23/21 (from the past 48 hour(s))   BASIC METABOLIC PANEL    Collection Time: 12/31/21  2:57 AM   Result Value    SODIUM 134 (L)    POTASSIUM 4.6    CHLORIDE 95 (L)    CO2 TOTAL 35 (H)    GLUCOSE 281 (H)    BUN 16    CREATININE 0.75      No results found for this or any previous visit (from the past 48 hour(s)).   No results found for this or any previous visit (from the past 48 hour(s)).   No results found for this or any previous visit (from the past 48 hour(s)).   Results for orders placed or performed in visit on 12/23/21 (from the past 1344 hour(s))    HGA1C (HEMOGLOBIN A1C WITH EST AVG GLUCOSE)    Collection Time: 12/23/21  5:42 AM   Result Value    HEMOGLOBIN A1C 9.6 (H)      No results found for this or any previous visit (from the past 48 hour(s)).     Microbiology:  No results found for any visits on 12/23/21 (from the past 96 hour(s)).    Imaging:   CT BRAIN WO IV CONTRAST  Narrative: Jordan Buckley    PROCEDURE DESCRIPTION: CT OF THE HEAD WITHOUT INTRAVENOUS CONTRAST    CLINICAL HISTORY: Altered mental status; slurred speech       COMPARISON: 12/23/2021    TECHNIQUE: A CT of the head was performed utilizing contiguous axial imaging. No intravenous contrast material was injected.    FINDINGS: The ventricular system, cortical sulci, and basilar cisterns appear mildly to moderately  dilated consistent with mild to moderate cerebral and cerebellar volume loss. No evidence of an acute intracranial hemorrhage is seen. No mass effect or midline shift is noted. No evidence of an acute cerebrovascular accident is seen.  Chronic small vessel ischemic changes are identified in the periventricular deep white matter and centrum semiovale..  Chronic small vessel ischemic changes are identified in the periventricular deep white matter and centrum semiovale.  The paranasal sinuses and mastoid air cells are within normal appearance.  Impression: No evidence of an acute intracranial hemorrhage is noted.    Radiologist location ID: [id number]  ECG 12 LEAD     Sinus tachycardia  Rightward axis  Septal infarct (cited on or before 16-Dec-2021)  ST & T wave abnormality, consider inferior ischemia  Abnormal ECG  When compared with ECG of 16-Dec-2021 13:36,  Vent. rate has increased BY  43 BPM  Serial changes of Septal infarct present  Confirmed by Rana, Shahid ([id number]) on 12/25/2021 8:43:27 AM  XR AP MOBILE CHEST  Narrative: Jordan Buckley    PROCEDURE DESCRIPTION: XR PORTABLE CHEST X-RAY    CLINICAL HISTORY:hypoxemia    COMPARISON:12/23/2021        FINDINGS:  A single  view of the chest was obtained. No focal alveolar infiltrate is identified. The bronchovascular markings appear unchanged. The patient is status post median sternotomy. The heart size is within limits of normal. No pleural effusion or pneumothorax is seen.    Mild elevation of  the right hemidiaphragm is noted. Mild degenerative change of the mid to lower thoracic spine is seen.  Impression: No focal alveolar infiltrate is identified.    Radiologist location ID: [id number]        Assessment/ Plan:   Active Hospital Problems   (*Primary Problem)    Diagnosis   . *DKA (diabetic ketoacidosis) (CMS HCC)   . Hyperkalemia   . Hyponatremia   . AKI (acute kidney injury) (CMS HCC)   . Acute metabolic encephalopathy   . DKA, type 2 (CMS [place])     -Type 2 diabetes mellitus with DKA  Currently DKA resolved  Continue with Lantus and insulin sliding scale with Lantus and fingerstick blood sugar a.c. HS  Hemoglobin A1c 9.6 on 12/23/21    -acute kidney injury  This resolved will continue to monitor BMP    -Acute metabolic encephalopathy likely due to DKA with underlying dementia  Mental status appears to be at baseline currently.  She does not seem to be on any home medications for dementia    -Hypertension, controlled  She will be on Norvasc, to adjust as blood pressure responds    -Hypothyroidism  Continue Synthroid    -T12 and L1 compression fracture  T12 status post kyphoplasty  L1 compression fracture plan was for kyphoplasty as outpatient by IR.  Pain control    DVT prophylaxis, heparin  Code status, full code    Plan of care discussed with the daughters at bedside verbalized understanding and agreed.  They report that they want her to go to a different facility not where she came from.    Disposition Planning:  We will follow up with case management for discharge disposition planning.  Still awaiting placement.    Norman Herrlich, MD  12/31/2021  [place] HOSPITALIST

## 2021-12-31 NOTE — Care Plan (Signed)
Problem: Fall Injury Risk  Goal: Absence of Fall and Fall-Related Injury  Outcome: Ongoing (see interventions/notes)  Intervention: Promote Injury-Free Environment  Recent Flowsheet Documentation  Taken 12/31/2021 1215 by Hollice Espy, RN  Safety Promotion/Fall Prevention:   fall prevention program maintained   safety round/check completed   nonskid shoes/slippers when out of bed  Taken 12/31/2021 0845 by Hollice Espy, RN  Safety Promotion/Fall Prevention:   fall prevention program maintained   safety round/check completed     Problem: Diabetic Ketoacidosis  Goal: Optimal Coping  Outcome: Ongoing (see interventions/notes)  Goal: Fluid and Electrolyte Balance with Absence of Ketosis  Outcome: Ongoing (see interventions/notes)     Problem: Skin Injury Risk Increased  Goal: Skin Health and Integrity  Outcome: Ongoing (see interventions/notes)     Patient resting in bed at this time. No complaints voiced. Denies pain. Encouraged and assisted to turn q2h. Accucheck q4h.

## 2021-12-31 NOTE — Care Plan (Signed)
Nutrition f/u    Pt states "hanging in there".  Appetite improved and tolerating meals.  She has been diabetic for a while.  States scared when her sugar gets low. She can feel when it drops and knows to have a snack.  Basic review of diet recs. She had no questions.

## 2021-12-31 NOTE — PT Evaluation (Signed)
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  Recommend PT this adm as well as continue PT/OT in SNF setting    Discharge Needs:    Equipment Recommendation: (P) front wheeled walker, bedside commode      The patient presents with mobility limitations due to impaired balance, impaired strength, impaired functional activity tolerance, and pain  that significantly impair/prevent patient's ability to participate in mobility-related activities of daily living (MRADLs) including  ambulation and transfers in order to safely complete, toileting, bathing, food preparation, safely entering/exiting the home. This functional mobility deficit can be partially resolved with the use of a (P) front wheeled walker, bedside commode  in order to decrease the risk of falls, morbidity, and mortality in performance of these MRADLs.  Patient is not able to safely use this assistive device independently.    Discharge Disposition: (P) skilled nursing facility    JUSTIFICATION OF DISCHARGE RECOMMENDATION    Based on current diagnosis, functional performance prior to admission, and current functional performance, this patient requires continued PT services in (P) skilled nursing facility in order to achieve significant functional improvements in these deficit areas: (P) aerobic capacity/endurance, gait, locomotion, and balance, arousal, attention, and cognition, motor function.        Plan:   Current Intervention: (P) bed mobility training, gait training, transfer training, strengthening, patient/family education  To provide physical therapy services (P)  (1-2x daily at least one day weekly)  for duration of (P) until discharge.    The risks/benefits of therapy have been discussed with the patient/caregiver and he/she is in agreement with the established plan of care.       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[place])     Arthropathy     Cancer (CMS [place])     Coronary artery disease     CVA (cerebrovascular accident) (CMS [place])     Diabetes mellitus, type 2 (CMS HCC)     Hard of hearing     HTN (hypertension)     Osteoporosis     Thyroid disease     Wears glasses             Past Surgical History:   Procedure Laterality Date    FIXATION KYPHOPLASTY Right  11/27/2021    HX BACK SURGERY      HX CATARACT REMOVAL      HX CESAREAN SECTION      HX CHOLECYSTECTOMY      HX CORONARY ARTERY BYPASS GRAFT      HX HYSTERECTOMY      KIDNEY SURGERY Left     cancerous tumor removed    LIVER RESECTION      WRIST SURGERY Left                  12/31/21 1117   Rehab Session   Document Type evaluation   Total PT Minutes: 20   Patient Effort fair   Patient Effort, Rehab Treatment Comment limited by c/o being weak   Symptoms Noted During/After Treatment none   General Information   Patient Profile Reviewed yes   Onset of Illness/Injury or Date of Surgery 12/23/21   Patient/Family/Caregiver Comments/Observations Pt had difficulty giving reliable information due to disorientation   Pertinent  History of Current Functional Problem dm. with DKA, Hyperkalemia, Hyponatremia, AKI, metabolic enephalopathy.  Na 134, K 4.6.  Per chart , transferred from NH, uncertain if receiving PT/if amb, etc PTA   Medical Lines Telemetry;PIV Line  (pulse oximeter)   Respiratory Status nasal cannula   Existing Precautions/Restrictions fall precautions   Mutuality/Individual Preferences   Anxieties, Fears or Concerns back pain   Individualized Care Needs safety during mobility, monitoring vitals   Patient-Specific Goals (Include Timeframe) to be discharged soon   Plan of Care Reviewed With patient   Living Environment   Lives With facility resident   [place] from NH, uncertain if SNF or long term care; had lived alone prior with daughter assist as needed, ramp to enter home   Functional Level Prior   Ambulation 3 - assistive equipment and person   Transferring 3 - assistive equipment and person   Toileting 3 - assistive equipment and person   Bathing 3 - assistive equipment and person   Dressing 2 - assistive person   Eating 0 - independent   Communication 2 - difficulty understanding (not related to language barrier)   Pre Treatment Status   Pre Treatment Patient Status Patient supine in bed;Call light within reach;Nurse approved session  (bed check)   Support Present Pre Treatment  None   Communication Pre Treatment  Charge Nurse   Communication Pre Treatment Comment cleared for PT   Cognitive Assessment/Interventions   Behavior/Mood Observations behavior appropriate to situation, WNL/WFL;confused   Orientation Status oriented to;person   Attention mild impairment   Follows Commands verbal cues/prompting required   Vital Signs   Pre-Treatment Heart Rate (beats/min) 87   Post-treatment Heart Rate (beats/min) 90   Pre SpO2 (%) 95   O2 Delivery Pre Treatment supplemental O2   Post SpO2 (%) 87   O2 Delivery Post Treatment room air   Vitals Comment O2 off during short distance  amb, desat to 87%, reapplied O2 upon return to bed, 94%.   Pain Assessment   Additional Documentation Word Pain Scale, Pre/Post Treatment (Group)   Pretreatment Pain Rating 4/10   Posttreatment Pain Rating 4/10   Pain Location - Side Left   Pain Location - Orientation lower   Pain Location back   Pain Scale: Word Pre/Post-Treatment   Pain: Word Scale, Pretreatment 4 - moderate pain   Posttreatment Pain Rating 4 - moderate pain   RUE Assessment   RUE Assessment X- Exceptions  RUE ROM shoulder flex painful, limited ROM, states prior RCT; elbow/hand [facility]   RUE Strength shoulder flex 3-/5/pain; elbow flex/ext and grip 4-/5   LUE Assessment   LUE Assessment WFL for Age   RLE Assessment   RLE Assessment X-Exceptions   RLE ROM WFL   RLE Strength 4-/5 hip flex, knee ext, ankle DF   LLE Assessment   LLE Assessment WFL for age   Bed Mobility Assessment/Treatment   Bed Mobility, Assistive Device bed rails   Supine-Sit Independence minimum assist (75% patient effort);verbal cues required   Sit to Supine, Independence minimum assist (75% patient effort);verbal cues required   Impairments pain;strength decreased   Comment back pain; cues for scooting   Transfer Assessment/Treatment   Sit-Stand Independence minimum assist (75% patient effort);verbal cues required   Stand-Sit Independence minimum assist (75% patient effort);verbal cues required   Sit-Stand-Sit, Assist Device walker, front wheeled   Transfer Safety Issues balance decreased during turns   Transfer Impairments endurance;cognition impaired;balance impaired;pain;strength decreased   Transfer Comment cues for safety   Gait Assessment/Treatment   Total Distance Ambulated 16   Independence  minimum assist (75% patient effort);verbal cues required   Assistive Device  walker, front wheeled   Distance in Feet 16   Gait Speed slow   Deviations  double stance time increased;step length decreased;toe-to-floor clearance decreased   Safety Issues  balance decreased during  turns;step length decreased   Impairments  balance impaired;cognition impaired;endurance;pain;strength decreased   Comment Pt limited distance due to fatique, weakness RLE;  forward flexed trunk   Balance Skill Training   Sitting Balance: Static fair balance   Sit-to-Stand Balance poor + balance   Standing Balance: Static fair - balance   Post Treatment Status   Post Treatment Patient Status Patient supine in bed;Call light within reach  (bed check)   Support Present Post Treatment  None   Plan of Care Review   Plan Of Care Reviewed With patient   Functional Impairment   Overall Functional Impairments/Problem List balance impaired;cognition impaired;endurance;pain;strength decreased   Physical Therapy Clinical Impression   Assessment pt presents with general weakness and fatique, limited endurance/tolerance to activity, Rt side weakness greater than Lt; decreased independence/safety during mobility and amb, requiring assistance for safety.  Recommend PT this adm as well as continue PT/OT in SNF setting   Criteria for Skilled Therapeutic meets criteria   Pathology/Pathophysiology Noted musculoskeletal;neuromuscular;pulmonary   Impairments Found (describe specific impairments) aerobic capacity/endurance;gait, locomotion, and balance;arousal, attention, and cognition;motor function   Functional Limitations in Following  self-care   Rehab Potential fair   Therapy Frequency   (1-2x daily at least one day weekly)   Predicted Duration of Therapy Intervention (days/wks) until discharge   Anticipated Equipment Needs at Discharge (PT) front wheeled walker;bedside commode   Anticipated Discharge Disposition skilled nursing facility   Referral Needed to Another Service occupational therapy   Evaluation Complexity Justification   Patient History: Co-morbidity/factors that impact Plan of Care 3 or more that impact Plan of Care   Examination Components Vital signs (BP, HR, RR, or O2 saturation);Strength;Balance;Bed  mobility;Transfers;Ambulation   Presentation Stable: Uncomplicated, straight-forward, problem focused   Clinical Decision Making Low complexity   Evaluation Complexity Low complexity   Care Plan Goals   PT Rehab Goals Bed Mobility Goal;Gait Training Goal;Transfer Training Goal   Bed Mobility Goal   Bed Mobility Goal, Date Established 12/31/21   Bed Mobility Goal, Time to Achieve by discharge   Bed Mobility Goal, Activity Type scoot/bridge;supine to  sit/sit to supine   Bed Mobility Goal, Independence Level contact guard assist;verbal cues required   Bed Mobility Goal, Assistive Device bed rails   Gait Training  Goal, Distance to Achieve   Gait Training  Goal, Date Established 12/31/21   Gait Training  Goal, Time to Achieve by discharge   Gait Training  Goal, Independence Level contact guard assist;verbal cues required   Gait Training  Goal, Assist Device walker, rolling   Gait Training  Goal, Distance to Achieve 50   Gait Training  Goal, Additional Goal with O2 as needed   Transfer Training Goal   Transfer Training Goal, Date Established 12/31/21   Transfer Training Goal, Time to Achieve by discharge   Transfer Training Goal, Activity Type bed-to-chair/chair-to-bed;sit-to-stand/stand-to-sit   Transfer Training Goal, Independence Level contact guard assist;verbal cues required   Transfer Training Goal, Assist Device walker, rolling;commode, bedside   Planned Therapy Interventions, PT Eval   Planned Therapy Interventions (PT) bed mobility training;gait training;transfer training;strengthening;patient/family education   (INSERT FLOWSHEET)            INTERVENTION MINUTES: EVALUATION 20 minutes    EVALUATION COMPLEXITY : CLINICAL DECISION MAKING OF LOW COMPLEXITY AS INDICATED BY PMH, PHYSICAL THERAPY ASSESSMENT OF MUSCULOSKELETAL AND NEUROLOGICAL SYSTEMS AND ACTIVITY LIMITATIONS. CLINICAL PRESENTATION IS STABLE AND UNCOMPLICATED    Therapist:     Vonna Kotyk, PT  12/31/2021, 12:56

## 2022-01-01 LAB — POC BLOOD GLUCOSE (RESULTS)
GLUCOSE, POC: 73 mg/dl (ref 50–500)
GLUCOSE, POC: 56 mg/dl (ref 50–500)
GLUCOSE, POC: 112 mg/dl (ref 50–500)
GLUCOSE, POC: 412 mg/dl (ref 50–500)
GLUCOSE, POC: 209 mg/dl (ref 50–500)
GLUCOSE, POC: 222 mg/dl (ref 50–500)

## 2022-01-01 LAB — BASIC METABOLIC PANEL
ANION GAP: 4 mmol/L — ABNORMAL LOW (ref 10–20)
BUN/CREA RATIO: 24 — ABNORMAL HIGH (ref 6–22)
BUN: 17 mg/dL (ref 7–25)
CALCIUM: 9.2 mg/dL (ref 8.6–10.3)
CHLORIDE: 96 mmol/L — ABNORMAL LOW (ref 98–107)
CO2 TOTAL: 35 mmol/L — ABNORMAL HIGH (ref 21–31)
CREATININE: 0.72 mg/dL (ref 0.60–1.30)
ESTIMATED GFR: 84 mL/min/{1.73_m2} (ref >59)
GLUCOSE: 145 mg/dL — ABNORMAL HIGH (ref 74–109)
OSMOLALITY, CALCULATED: 274 mOsm/kg (ref 270–290)
POTASSIUM: 4.4 mmol/L (ref 3.5–5.1)
SODIUM: 135 mmol/L — ABNORMAL LOW (ref 136–145)

## 2022-01-01 LAB — MAGNESIUM: MAGNESIUM: 1.9 mg/dL (ref 1.9–2.7)

## 2022-01-01 NOTE — Progress Notes (Signed)
[place]    HOSPITALIST PROGRESS NOTE    Jordan Buckley  Date of service: 01/01/2022  Date of Admission:  12/23/2021  Hospital Day:  LOS: 9 days     Subjective:   Patient lying in bed appears to be alert and answers questions appropriately when woken up.  She denies chest pain shortness a breath or any other new complaints currently.     Vital Signs:  Filed Vitals:    12/31/21 2301 01/01/22 0437 01/01/22 0758 01/01/22 0854   BP:  (!) 116/58 (!) 112/53    Pulse: 91 78 83    Resp:  18 18    Temp:  36.6 C (97.9 F) 36.8 C (98.3 F)    SpO2:  96% 98% 95%        Physical Exam:  General:  Patient in NAD, resting in bed, no visitors present  Head:  Normocephalic, atraumatic  Eyes:  PERRL, anicteric sclera  ENT:  Oral mucosa moist, no nasal discharge   Neck:  Soft, supple, trachea midline  Heart:  RRR, S1 and S2 normal  Lungs:  Unlabored respirations.  Lungs are clear to auscultation bilaterally, with no wheezes, no rales, no conversational dyspnea  Abdomen:  Soft, active bowel sounds, non-tender to palpation, non-distended  Extremities:  Pulses equal bilaterally.  Capillary refill less than 3 seconds.  No edema in lower extremities bilaterally   Musculoskeletal: Lower back tenderness.  Skin:  Warm and dry, not diaphoretic.  No ecchymosis noted.   Neuro:  A&O x 3.  No focal deficits.  Speech intact  Psych:  Cooperative, not agitated    Intake & Output:    Intake/Output Summary (Last 24 hours) at 01/01/2022 1117  Last data filed at 01/01/2022 1105  Gross per 24 hour   Intake 1320 ml   Output --   Net 1320 ml     I/O current shift:  05/24 0700 - 05/24 1859  In: 120 [P.O.:120]  Out: -   Emesis:    BM:    Date of Last Bowel Movement: 12/30/21  Heme:      acetaminophen (TYLENOL) tablet, 650 mg, Oral, Q4H PRN  alcohol 62 % (NOZIN NASAL SANITIZER) nasal solution, 1 Each, Each Nostril, 2x/day  amLODIPine (NORVASC) tablet, 10 mg, Oral, Daily  apixaban (ELIQUIS) tablet, 2.5 mg, Oral,  2x/day  atorvastatin (LIPITOR) tablet, 20 mg, Oral, QPM  dextrose 50% (0.5 g/mL) injection - syringe, 25 g, Intravenous, Q15 Min PRN   Or  dextrose 50% (0.5 g/mL) injection - syringe, 12.5 g, Intravenous, Q15 Min PRN   Or  glucagon (GLUCAGEN DIAGNOSTIC KIT) injection 1 mg, 1 mg, Subcutaneous, Q15 Min PRN   Or  glucagon (GLUCAGEN DIAGNOSTIC KIT) injection 1 mg, 1 mg, IntraMUSCULAR, Q15 Min PRN  docusate sodium (COLACE) capsule, 100 mg, Oral, Daily  ferrous sulfate 324 mg (65 mg elemental IRON) tablet, 324 mg, Oral, Daily before Breakfast  HYDROcodone-acetaminophen (NORCO) 5-325 mg per tablet, 1 Tablet, Oral, Q6H PRN  insulin glargine 100 units/mL injection, 8 Units, Subcutaneous, NIGHTLY  levothyroxine (SYNTHROID) tablet, 75 mcg, Oral, QAM  magnesium oxide (MAG-OX) 466m (2431melemental magnesium) tablet, 400 mg, Oral, 2x/day  miconazole (MONISTAT) 2% vaginal cream, 1 Applicator, Topical, 2x/day  ondansetron (ZOFRAN ODT) rapid dissolve tablet, 4 mg, Oral, Q8H PRN  pramipexole (MIRAPEX) tablet, 0.125 mg, Oral, QPM  SSIP insulin R human (HUMULIN R) 100 units/mL injection, 0-12 Units, Subcutaneous, Q4H PRN  Labs:  No results found for this or any previous visit (from the past 48 hour(s)).   Results for orders placed or performed during the hospital encounter of 12/23/21 (from the past 48 hour(s))   BASIC METABOLIC PANEL    Collection Time: 01/01/22  2:47 AM   Result Value    SODIUM 135 (L)    POTASSIUM 4.4    CHLORIDE 96 (L)    CO2 TOTAL 35 (H)    GLUCOSE 145 (H)    BUN 17    CREATININE 0.72      No results found for this or any previous visit (from the past 48 hour(s)).   No results found for this or any previous visit (from the past 48 hour(s)).   No results found for this or any previous visit (from the past 48 hour(s)).   Results for orders placed or performed in visit on 12/23/21 (from the past 1344 hour(s))   HGA1C (HEMOGLOBIN A1C WITH EST AVG GLUCOSE)    Collection Time: 12/23/21  5:42 AM   Result Value     HEMOGLOBIN A1C 9.6 (H)      No results found for this or any previous visit (from the past 48 hour(s)).     Microbiology:  No results found for any visits on 12/23/21 (from the past 96 hour(s)).    Imaging:   CT BRAIN WO IV CONTRAST  Narrative: Taffy PEARL Geist    PROCEDURE DESCRIPTION: CT OF THE HEAD WITHOUT INTRAVENOUS CONTRAST    CLINICAL HISTORY: Altered mental status; slurred speech       COMPARISON: 12/23/2021    TECHNIQUE: A CT of the head was performed utilizing contiguous axial imaging. No intravenous contrast material was injected.    FINDINGS: The ventricular system, cortical sulci, and basilar cisterns appear mildly to moderately  dilated consistent with mild to moderate cerebral and cerebellar volume loss. No evidence of an acute intracranial hemorrhage is seen. No mass effect or midline shift is noted. No evidence of an acute cerebrovascular accident is seen.  Chronic small vessel ischemic changes are identified in the periventricular deep white matter and centrum semiovale..  Chronic small vessel ischemic changes are identified in the periventricular deep white matter and centrum semiovale.  The paranasal sinuses and mastoid air cells are within normal appearance.  Impression: No evidence of an acute intracranial hemorrhage is noted.    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 rate has increased BY  43 BPM  Serial changes of Septal infarct present  Confirmed by Rana, Shahid ([id number]) on 12/25/2021 8:43:27 AM  XR AP MOBILE CHEST  Narrative: Eunice PEARL Mello    PROCEDURE DESCRIPTION: XR PORTABLE CHEST X-RAY    CLINICAL HISTORY:hypoxemia    COMPARISON:12/23/2021        FINDINGS:  A single view of the chest was obtained. No focal alveolar infiltrate is identified. The bronchovascular  markings appear unchanged. The patient is status post median sternotomy. The heart size is within limits of normal. No pleural effusion or pneumothorax is seen.    Mild elevation of the right hemidiaphragm is noted. Mild degenerative change of the mid to lower thoracic spine is seen.  Impression: No focal alveolar infiltrate is identified.    Radiologist location ID: [id number]  Assessment/ Plan:   Active Hospital Problems   (*Primary Problem)    Diagnosis   . *DKA (diabetic ketoacidosis) (CMS HCC)   . Hyperkalemia   . Hyponatremia   . AKI (acute kidney injury) (CMS HCC)   . Acute metabolic encephalopathy   . DKA, type 2 (CMS [place])     -Type 2 diabetes mellitus with DKA  Currently DKA resolved  Continue with Lantus and insulin sliding scale with Lantus and fingerstick blood sugar a.c. HS  Hemoglobin A1c 9.6 on 12/23/21    -acute kidney injury  This resolved will continue to monitor BMP    -Acute metabolic encephalopathy likely due to DKA with underlying dementia  Mental status appears to be at baseline currently.  She does not seem to be on any home medications for dementia    -Hypertension, controlled  She will be on Norvasc, to adjust as blood pressure responds    -Hypothyroidism  Continue Synthroid    -T12 and L1 compression fracture  T12 status post kyphoplasty  L1 compression fracture plan was for kyphoplasty as outpatient by IR.  Pain control    DVT prophylaxis, heparin  Code status, full code    Plan of care discussed with the daughters at bedside verbalized understanding and agreed.  They report that they want her to go to a different facility not where she came from.    Disposition Planning:  We will follow up with case management for discharge disposition planning.  Still awaiting placement.    Norman Herrlich, MD  01/01/2022  [place] HOSPITALIST

## 2022-01-01 NOTE — Ancillary Notes (Addendum)
Pt admitted with DKA on 12/23/21.  A1C 9.8.  Pt has hx of T2DM.   Pt stated that she manages her own DM needs.   Gives her own injections using sliding scale.  Gave and explained DKA handout and 4 Steps to managing DM of Life booklet.   DM education completed.   See Education section.

## 2022-01-01 NOTE — Care Management Notes (Signed)
CM contacted Nissa with [facility] to verify she received referral through [facility]. Nissa did receive it and it is under review. Pt will need Medicaid. CM contacted Pt's daughter who states the Medicaid application has been competed and she has been working with Alyse Low with [facility]. CM to follow up with [facility].

## 2022-01-02 LAB — POC BLOOD GLUCOSE (RESULTS)
GLUCOSE, POC: 129 mg/dl (ref 50–500)
GLUCOSE, POC: 156 mg/dl (ref 50–500)
GLUCOSE, POC: 156 mg/dl (ref 50–500)
GLUCOSE, POC: 467 mg/dl (ref 50–500)
GLUCOSE, POC: 527 mg/dl (ref 50–500)

## 2022-01-02 NOTE — Progress Notes (Signed)
[place]    HOSPITALIST PROGRESS NOTE    Jordan Buckley  Date of service: 01/02/2022  Date of Admission:  12/23/2021  Hospital Day:  LOS: 10 days     Subjective:   Patient more awake today able to answer questions appropriately.  She denies chest pain shortness a breath fever or any other new complaints.  Still awaiting placement.      Vital Signs:  Filed Vitals:    01/01/22 2325 01/02/22 0100 01/02/22 0808 01/02/22 1016   BP:  106/64 (!) 120/58    Pulse: 73 79 85 81   Resp:  17 18    Temp:  36.8 C (98.2 F) 36.1 C (96.9 F)    SpO2:  93% 92%         Physical Exam:  General:  Patient in NAD, resting in bed, no visitors present  Head:  Normocephalic, atraumatic  Eyes:  PERRL, anicteric sclera  ENT:  Oral mucosa moist, no nasal discharge .  Oxygen nasal cannula in place.  Neck:  Soft, supple, trachea midline  Heart:  RRR, S1 and S2 normal  Lungs:  Unlabored respirations.  Lungs are clear to auscultation bilaterally, with no wheezes, no rales, no conversational dyspnea  Abdomen:  Soft, active bowel sounds, non-tender to palpation, non-distended  Extremities:  Pulses equal bilaterally.  Capillary refill less than 3 seconds.  No edema in lower extremities bilaterally   Musculoskeletal: Lower back tenderness.  Skin:  Warm and dry, not diaphoretic.  No ecchymosis noted.   Neuro:  A&O x 3.  No focal deficits.  Speech intact  Psych:  Cooperative, not agitated    Intake & Output:    Intake/Output Summary (Last 24 hours) at 01/02/2022 1136  Last data filed at 01/02/2022 1000  Gross per 24 hour   Intake 940 ml   Output 900 ml   Net 40 ml     I/O current shift:  05/25 0700 - 05/25 1859  In: 240 [P.O.:240]  Out: -   Emesis:    BM:    Date of Last Bowel Movement: 01/01/22  Heme:      acetaminophen (TYLENOL) tablet, 650 mg, Oral, Q4H PRN  alcohol 62 % (NOZIN NASAL SANITIZER) nasal solution, 1 Each, Each Nostril, 2x/day  amLODIPine (NORVASC) tablet, 10 mg, Oral, Daily  apixaban (ELIQUIS) tablet,  2.5 mg, Oral, 2x/day  atorvastatin (LIPITOR) tablet, 20 mg, Oral, QPM  dextrose 50% (0.5 g/mL) injection - syringe, 25 g, Intravenous, Q15 Min PRN   Or  dextrose 50% (0.5 g/mL) injection - syringe, 12.5 g, Intravenous, Q15 Min PRN   Or  glucagon (GLUCAGEN DIAGNOSTIC KIT) injection 1 mg, 1 mg, Subcutaneous, Q15 Min PRN   Or  glucagon (GLUCAGEN DIAGNOSTIC KIT) injection 1 mg, 1 mg, IntraMUSCULAR, Q15 Min PRN  docusate sodium (COLACE) capsule, 100 mg, Oral, Daily  ferrous sulfate 324 mg (65 mg elemental IRON) tablet, 324 mg, Oral, Daily before Breakfast  HYDROcodone-acetaminophen (NORCO) 5-325 mg per tablet, 1 Tablet, Oral, Q6H PRN  insulin glargine 100 units/mL injection, 8 Units, Subcutaneous, NIGHTLY  levothyroxine (SYNTHROID) tablet, 75 mcg, Oral, QAM  magnesium oxide (MAG-OX) 447m (2455melemental magnesium) tablet, 400 mg, Oral, 2x/day  miconazole (MONISTAT) 2% vaginal cream, 1 Applicator, Topical, 2x/day  ondansetron (ZOFRAN ODT) rapid dissolve tablet, 4 mg, Oral, Q8H PRN  pramipexole (MIRAPEX) tablet, 0.125 mg, Oral, QPM  SSIP insulin R human (HUMULIN R) 100 units/mL injection, 0-12 Units, Subcutaneous, Q4H PRN  Labs:  No results found for this or any previous visit (from the past 48 hour(s)).   Results for orders placed or performed during the hospital encounter of 12/23/21 (from the past 48 hour(s))   BASIC METABOLIC PANEL    Collection Time: 01/01/22  2:47 AM   Result Value    SODIUM 135 (L)    POTASSIUM 4.4    CHLORIDE 96 (L)    CO2 TOTAL 35 (H)    GLUCOSE 145 (H)    BUN 17    CREATININE 0.72      No results found for this or any previous visit (from the past 48 hour(s)).   No results found for this or any previous visit (from the past 48 hour(s)).   No results found for this or any previous visit (from the past 48 hour(s)).   Results for orders placed or performed in visit on 12/23/21 (from the past 1344 hour(s))   HGA1C (HEMOGLOBIN A1C WITH EST AVG GLUCOSE)    Collection Time: 12/23/21  5:42 AM    Result Value    HEMOGLOBIN A1C 9.6 (H)      No results found for this or any previous visit (from the past 48 hour(s)).     Microbiology:  No results found for any visits on 12/23/21 (from the past 96 hour(s)).    Imaging:   CT BRAIN WO IV CONTRAST  Narrative: Lynell PEARL Fotheringham    PROCEDURE DESCRIPTION: CT OF THE HEAD WITHOUT INTRAVENOUS CONTRAST    CLINICAL HISTORY: Altered mental status; slurred speech       COMPARISON: 12/23/2021    TECHNIQUE: A CT of the head was performed utilizing contiguous axial imaging. No intravenous contrast material was injected.    FINDINGS: The ventricular system, cortical sulci, and basilar cisterns appear mildly to moderately  dilated consistent with mild to moderate cerebral and cerebellar volume loss. No evidence of an acute intracranial hemorrhage is seen. No mass effect or midline shift is noted. No evidence of an acute cerebrovascular accident is seen.  Chronic small vessel ischemic changes are identified in the periventricular deep white matter and centrum semiovale..  Chronic small vessel ischemic changes are identified in the periventricular deep white matter and centrum semiovale.  The paranasal sinuses and mastoid air cells are within normal appearance.  Impression: No evidence of an acute intracranial hemorrhage is noted.    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 rate has increased BY  43 BPM  Serial changes of Septal infarct present  Confirmed by Rana, Shahid ([id number]) on 12/25/2021 8:43:27 AM  XR AP MOBILE CHEST  Narrative: Zaiyah PEARL Fraley    PROCEDURE DESCRIPTION: XR PORTABLE CHEST X-RAY    CLINICAL HISTORY:hypoxemia    COMPARISON:12/23/2021        FINDINGS:  A single view of the chest was obtained. No focal alveolar infiltrate is identified. The  bronchovascular markings appear unchanged. The patient is status post median sternotomy. The heart size is within limits of normal. No pleural effusion or pneumothorax is seen.    Mild elevation of the right hemidiaphragm is noted. Mild degenerative change of the mid to lower thoracic spine is seen.  Impression: No focal alveolar infiltrate is identified.    Radiologist location ID: [id number]  Assessment/ Plan:   Active Hospital Problems   (*Primary Problem)    Diagnosis   . *DKA (diabetic ketoacidosis) (CMS HCC)   . Hyperkalemia   . Hyponatremia   . AKI (acute kidney injury) (CMS HCC)   . Acute metabolic encephalopathy   . DKA, type 2 (CMS [place])     -Type 2 diabetes mellitus with DKA  Currently DKA resolved  Continue with Lantus and insulin sliding scale with Lantus and fingerstick blood sugar a.c. HS  Hemoglobin A1c 9.6 on 12/23/21  Diabetic teaching    -acute kidney injury  This resolved will continue to monitor BMP    -Acute metabolic encephalopathy likely due to DKA with underlying dementia  Mental status appears to be at baseline currently.  She does not seem to be on any home medications for dementia    -Hypertension, controlled  She will be on Norvasc, to adjust as blood pressure responds    -Hypothyroidism  Continue Synthroid    -T12 and L1 compression fracture  T12 status post kyphoplasty  L1 compression fracture plan was for kyphoplasty as outpatient by IR.  Pain control    DVT prophylaxis, heparin  Code status, full code    Plan of care discussed with the daughters at bedside verbalized understanding and agreed.  They report that they want her to go to a different facility not where she came from.    Disposition Planning:  We will follow up with case management for discharge disposition planning.  Still awaiting placement.    Norman Herrlich, MD  01/02/2022  [facility] MEDICINE HOSPITALIST

## 2022-01-02 NOTE — PT Treatment (Signed)
[facility]  [place], [address]  [phone]  [phone]  Rehabilitation Department  Physical Therapy Daily Inpatient Note    Date: 01/02/2022  Patient's Name: Jordan Buckley  Date of Birth: Mar 25, 1941  Height: Height: 157.5 cm ('5\' 2"'$ )  Weight: Weight: 55 kg (121 lb 3.2 oz)      Plan: Will continue under current POC.         Subjective/Objective/Assessment:  Flowsheet    01/02/22 1400   Rehab Session   Document Type therapy progress note (daily note)   Total PT Minutes: 12   Patient Effort fair   General Information   Patient Profile Reviewed yes   Medical Lines PIV Line;Telemetry   Respiratory Status nasal cannula   Existing Precautions/Restrictions fall precautions   Cognitive Assessment/Interventions   Behavior/Mood Observations behavior appropriate to situation, WNL/WFL   Orientation Status oriented to;person   Attention mild impairment   Vital Signs   Pre-Treatment Heart Rate (beats/min) 98   Post-treatment Heart Rate (beats/min) 102   Pre SpO2 (%) 91   O2 Delivery Pre Treatment supplemental O2   Post SpO2 (%) 95   O2 Delivery Post Treatment room air   Pain Assessment   Additional Documentation Numbers Pain Scale, Pre/Post Treatment (Group)   Pain Intervention  Scheduled Medication   Pretreatment Pain Rating 7/10   Posttreatment Pain Rating 7/10   Pain Location - Side Left   Pain Location - Orientation lower   Pain Location back   Bed Mobility Assessment/Treatment   Bed Mobility, Assistive Device bed rails   Supine-Sit [place] minimum assist (75% patient effort)   Sit to Supine, [place] minimum assist (75% patient effort)   Impairments pain;strength decreased   Transfer Assessment/Treatment   Sit-Stand [place] minimum assist (75% patient effort)   Stand-Sit [place] minimum assist (75% patient effort)   Sit-Stand-Sit, Assist Device walker, front wheeled   Transfer Safety Issues balance decreased during turns   Transfer  Impairments pain;endurance;strength decreased   Transfer Comment cues for safety and some assist for walker control   Gait Assessment/Treatment   Total Distance Ambulated 20   [place]  minimum assist (75% patient effort)   Assistive Device  walker, front wheeled   Gait Speed slow   Safety Issues  balance decreased during turns   Impairments  balance impaired;endurance;pain;strength decreased   Comment patient is limited with distance due to back pain   Balance Skill Training   Sitting Balance: Static fair + balance   Sit-to-Stand Balance fair - balance   Standing Balance: Static fair - balance   Functional Impairment   Overall Functional Impairments/Problem List endurance;balance impaired;pain;strength decreased   Physical Therapy Clinical Impression   Assessment patient was found in the bed, transfered supine to sit with min of 1, standing with min of 1, gaited with rw 84f withcouple standing rests due to back pain, also transfered on and off pottychair with min assist, btb with min of 1, need sin reach, bed alarmed as well                 Intervention minutes: GAIT TRAINING 12 MINUTES    THERAPIST  VJulio Sicks PTA  01/02/2022, 14:40

## 2022-01-02 NOTE — Nurses Notes (Signed)
Patients blood glucose checked by primary RN: 527. Primary nurse gave scheduled Lantus and sliding scale Humulin insulin per MAR. NP Gardner Candle notified since BG over 400. Will recheck at midnight, no additional insulin to be given at this time.

## 2022-01-03 LAB — BASIC METABOLIC PANEL
ANION GAP: 6 mmol/L — ABNORMAL LOW (ref 10–20)
BUN/CREA RATIO: 30 — ABNORMAL HIGH (ref 6–22)
BUN: 19 mg/dL (ref 7–25)
CALCIUM: 9.4 mg/dL (ref 8.6–10.3)
CHLORIDE: 97 mmol/L — ABNORMAL LOW (ref 98–107)
CO2 TOTAL: 32 mmol/L — ABNORMAL HIGH (ref 21–31)
CREATININE: 0.64 mg/dL (ref 0.60–1.30)
ESTIMATED GFR: 89 mL/min/{1.73_m2} (ref >59)
GLUCOSE: 49 mg/dL — CL (ref 74–109)
OSMOLALITY, CALCULATED: 270 mOsm/kg (ref 270–290)
POTASSIUM: 4.6 mmol/L (ref 3.5–5.1)
SODIUM: 135 mmol/L — ABNORMAL LOW (ref 136–145)

## 2022-01-03 LAB — CBC WITH DIFF
BASOPHIL #: 0.1 10*3/uL (ref 0.00–0.30)
BASOPHIL %: 1 % (ref 0–3)
EOSINOPHIL #: 0.1 10*3/uL (ref 0.00–0.80)
EOSINOPHIL %: 2 % (ref 0–7)
HCT: 32.7 % — ABNORMAL LOW (ref 37.0–47.0)
HGB: 10.9 g/dL — ABNORMAL LOW (ref 12.5–16.0)
LYMPHOCYTE #: 1.5 10*3/uL (ref 1.10–5.00)
LYMPHOCYTE %: 28 % (ref 25–45)
MCH: 30.2 pg (ref 27.0–32.0)
MCHC: 33.5 g/dL (ref 32.0–36.0)
MCV: 90.1 fL (ref 78.0–99.0)
MONOCYTE #: 0.5 10*3/uL (ref 0.00–1.30)
MONOCYTE %: 10 % (ref 0–12)
MPV: 6.1 fL — ABNORMAL LOW (ref 7.4–10.4)
NEUTROPHIL #: 3.2 10*3/uL (ref 1.80–8.40)
NEUTROPHIL %: 60 % (ref 40–76)
PLATELETS: 451 10*3/uL — ABNORMAL HIGH (ref 140–440)
RBC: 3.63 10*6/uL — ABNORMAL LOW (ref 4.20–5.40)
RDW: 14.3 % (ref 11.6–14.8)
WBC: 5.4 10*3/uL (ref 4.0–10.5)
WBCS UNCORRECTED: 5.4 10*3/uL

## 2022-01-03 LAB — POC BLOOD GLUCOSE (RESULTS)
GLUCOSE, POC: 128 mg/dl (ref 50–500)
GLUCOSE, POC: 216 mg/dl (ref 50–500)
GLUCOSE, POC: 237 mg/dl (ref 50–500)
GLUCOSE, POC: 251 mg/dl (ref 50–500)
GLUCOSE, POC: 336 mg/dl (ref 50–500)
GLUCOSE, POC: 353 mg/dl (ref 50–500)
GLUCOSE, POC: 49 mg/dl — CL (ref 50–500)
GLUCOSE, POC: 55 mg/dl (ref 50–500)
GLUCOSE, POC: 70 mg/dl (ref 50–500)

## 2022-01-03 LAB — MAGNESIUM: MAGNESIUM: 1.8 mg/dL — ABNORMAL LOW (ref 1.9–2.7)

## 2022-01-03 MED ORDER — MAGNESIUM SULFATE 1 GRAM/100 ML IN DEXTROSE 5 % INTRAVENOUS PIGGYBACK
1.0000 g | INJECTION | INTRAVENOUS | Status: AC
Start: 2022-01-03 — End: 2022-01-03
  Administered 2022-01-03 (×2): 0 g via INTRAVENOUS
  Administered 2022-01-03 (×2): 1 g via INTRAVENOUS
  Filled 2022-01-03 (×2): qty 100

## 2022-01-03 NOTE — Progress Notes (Signed)
Jordan Buckley    HOSPITALIST PROGRESS NOTE    Jordan Buckley  Date of service: 01/03/2022  Date of Admission:  12/23/2021  Hospital Day:  LOS: 11 days     Subjective:   Patient patient seen while lying in bed awake alert able to answer questions.  She denies fever chest pain shortness a breath or any other complaints. She still awaiting placement.      Vital Signs:  Filed Vitals:    01/02/22 2150 01/02/22 2241 01/02/22 2252 01/03/22 0038   BP: (!) 118/52   (!) 94/47   Pulse: 77 90  89   Resp: '16  17 16   ' Temp: 36.7 C (98.1 F)      SpO2: 94%   93%        Physical Exam:  General:  Patient in NAD, resting in bed, no visitors present  Head:  Normocephalic, atraumatic  Eyes:  PERRL, anicteric sclera  ENT:  Oral mucosa moist, no nasal discharge .  Oxygen nasal cannula in place.  Neck:  Soft, supple, trachea midline  Heart:  RRR, S1 and S2 normal  Lungs:  Unlabored respirations.  Lungs are clear to auscultation bilaterally, with no wheezes, no rales, no conversational dyspnea  Abdomen:  Soft, active bowel sounds, non-tender to palpation, non-distended  Extremities:  Pulses equal bilaterally.  Capillary refill less than 3 seconds.  No edema in lower extremities bilaterally   Musculoskeletal: Lower back tenderness.  Skin:  Warm and dry, not diaphoretic.  No ecchymosis noted.   Neuro:  A&O x 3.  No focal deficits.  Speech intact  Psych:  Cooperative, not agitated    Intake & Output:    Intake/Output Summary (Last 24 hours) at 01/03/2022 1032  Last data filed at 01/03/2022 0955  Gross per 24 hour   Intake 550 ml   Output --   Net 550 ml     I/O current shift:  05/26 0700 - 05/26 1859  In: 220 [P.O.:120]  Out: -   Emesis:    BM:    Date of Last Bowel Movement: 01/01/22  Heme:      acetaminophen (TYLENOL) tablet, 650 mg, Oral, Q4H PRN  alcohol 62 % (NOZIN NASAL SANITIZER) nasal solution, 1 Each, Each Nostril, 2x/day  amLODIPine (NORVASC) tablet, 10 mg, Oral, Daily  apixaban (ELIQUIS) tablet, 2.5  mg, Oral, 2x/day  atorvastatin (LIPITOR) tablet, 20 mg, Oral, QPM  dextrose 50% (0.5 g/mL) injection - syringe, 25 g, Intravenous, Q15 Min PRN   Or  dextrose 50% (0.5 g/mL) injection - syringe, 12.5 g, Intravenous, Q15 Min PRN   Or  glucagon (GLUCAGEN DIAGNOSTIC KIT) injection 1 mg, 1 mg, Subcutaneous, Q15 Min PRN   Or  glucagon (GLUCAGEN DIAGNOSTIC KIT) injection 1 mg, 1 mg, IntraMUSCULAR, Q15 Min PRN  docusate sodium (COLACE) capsule, 100 mg, Oral, Daily  ferrous sulfate 324 mg (65 mg elemental IRON) tablet, 324 mg, Oral, Daily before Breakfast  HYDROcodone-acetaminophen (NORCO) 5-325 mg per tablet, 1 Tablet, Oral, Q6H PRN  insulin glargine 100 units/mL injection, 8 Units, Subcutaneous, NIGHTLY  levothyroxine (SYNTHROID) tablet, 75 mcg, Oral, QAM  magnesium oxide (MAG-OX) 478m (2470melemental magnesium) tablet, 400 mg, Oral, 2x/day  miconazole (MONISTAT) 2% vaginal cream, 1 Applicator, Topical, 2x/day  ondansetron (ZOFRAN ODT) rapid dissolve tablet, 4 mg, Oral, Q8H PRN  pramipexole (MIRAPEX) tablet, 0.125 mg, Oral, QPM  SSIP insulin R human (HUMULIN R) 100 units/mL injection, 0-12 Units, Subcutaneous, Q4H PRN  Labs:  Recent Results (from the past 48 hour(s))   CBC WITH DIFF    Collection Time: 01/03/22  3:15 AM   Result Value    WBC 5.4    HGB 10.9 (L)    HCT 32.7 (L)    PLATELETS 451 (H)      Results for orders placed or performed during the hospital encounter of 12/23/21 (from the past 48 hour(s))   BASIC METABOLIC PANEL    Collection Time: 01/03/22  3:15 AM   Result Value    SODIUM 135 (L)    POTASSIUM 4.6    CHLORIDE 97 (L)    CO2 TOTAL 32 (H)    GLUCOSE 49 (LL)    BUN 19    CREATININE 0.64      No results found for this or any previous visit (from the past 48 hour(s)).   No results found for this or any previous visit (from the past 48 hour(s)).   No results found for this or any previous visit (from the past 48 hour(s)).   Results for orders placed or performed in visit on 12/23/21 (from the past  1344 hour(s))   HGA1C (HEMOGLOBIN A1C WITH EST AVG GLUCOSE)    Collection Time: 12/23/21  5:42 AM   Result Value    HEMOGLOBIN A1C 9.6 (H)      No results found for this or any previous visit (from the past 48 hour(s)).     Microbiology:  No results found for any visits on 12/23/21 (from the past 96 hour(s)).    Imaging:   CT BRAIN WO IV CONTRAST  Narrative: Jordan Buckley    PROCEDURE DESCRIPTION: CT OF THE HEAD WITHOUT INTRAVENOUS CONTRAST    CLINICAL HISTORY: Altered mental status; slurred speech       COMPARISON: 12/23/2021    TECHNIQUE: A CT of the head was performed utilizing contiguous axial imaging. No intravenous contrast material was injected.    FINDINGS: The ventricular system, cortical sulci, and basilar cisterns appear mildly to moderately  dilated consistent with mild to moderate cerebral and cerebellar volume loss. No evidence of an acute intracranial hemorrhage is seen. No mass effect or midline shift is noted. No evidence of an acute cerebrovascular accident is seen.  Chronic small vessel ischemic changes are identified in the periventricular deep white matter and centrum semiovale..  Chronic small vessel ischemic changes are identified in the periventricular deep white matter and centrum semiovale.  The paranasal sinuses and mastoid air cells are within normal appearance.  Impression: No evidence of an acute intracranial hemorrhage is noted.    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 rate has increased BY  43 BPM  Serial changes of Septal infarct present  Confirmed by Rana, Shahid ([id number]) on 12/25/2021 8:43:27 AM  XR AP MOBILE CHEST  Narrative: Jordan Buckley    PROCEDURE DESCRIPTION: XR PORTABLE CHEST X-RAY    CLINICAL HISTORY:hypoxemia    COMPARISON:12/23/2021         FINDINGS:  A single view of the chest was obtained. No focal alveolar infiltrate is identified. The bronchovascular markings appear unchanged. The patient is status post median sternotomy. The heart size is within limits of normal. No pleural effusion or pneumothorax is seen.  Mild elevation of the right hemidiaphragm is noted. Mild degenerative change of the mid to lower thoracic spine is seen.  Impression: No focal alveolar infiltrate is identified.    Radiologist location ID: [id number]        Assessment/ Plan:   Active Hospital Problems   (*Primary Problem)    Diagnosis   . *DKA (diabetic ketoacidosis) (CMS HCC)   . Hyperkalemia   . Hyponatremia   . AKI (acute kidney injury) (CMS HCC)   . Acute metabolic encephalopathy   . DKA, type 2 (CMS [place])     -Type 2 diabetes mellitus with DKA  Currently DKA resolved  Continue with Lantus and insulin sliding scale with Lantus and fingerstick blood sugar a.c. HS  Hemoglobin A1c 9.6 on 12/23/21  Diabetic teaching    -acute kidney injury  This resolved will continue to monitor BMP    -Acute metabolic encephalopathy likely due to DKA with underlying dementia  Mental status appears to be at baseline currently.  She does not seem to be on any home medications for dementia    -Hypertension, controlled  She will be on Norvasc, to adjust as blood pressure responds    -Hypothyroidism  Continue Synthroid    -T12 and L1 compression fracture  T12 status post kyphoplasty  L1 compression fracture plan was for kyphoplasty as outpatient by IR.  Pain control    DVT prophylaxis, heparin  Code status, full code    Plan of care discussed with the daughters at bedside verbalized understanding and agreed.  They report that they want her to go to a different facility not where she came from.    Disposition Planning:  Still awaiting placement.  Will follow up with case management.    Norman Herrlich, MD  01/03/2022  [place] HOSPITALIST

## 2022-01-03 NOTE — Care Plan (Signed)
Problem: Health Knowledge, Opportunity to Enhance (Adult,Obstetrics,Pediatric)  Goal: Knowledgeable about Health Subject/Topic  Description: Patient will demonstrate the desired outcomes by discharge/transition of care.  Outcome: Ongoing (see interventions/notes)     Problem: Adult Inpatient Plan of Care  Goal: Plan of Care Review  Outcome: Ongoing (see interventions/notes)  Goal: Patient-Specific Goal (Individualized)  Outcome: Ongoing (see interventions/notes)  Goal: Absence of Hospital-Acquired Illness or Injury  Outcome: Ongoing (see interventions/notes)  Intervention: Identify and Manage Fall Risk  Recent Flowsheet Documentation  Taken 01/03/2022 0800 by Shaaron Adler, RN  Safety Promotion/Fall Prevention:   activity supervised   fall prevention program maintained   safety round/check completed  Intervention: Prevent Skin Injury  Recent Flowsheet Documentation  Taken 01/03/2022 1200 by Shaaron Adler, RN  Body Position: turned q 2 hours  Taken 01/03/2022 1000 by Shaaron Adler, RN  Body Position: turned q 2 hours  Taken 01/03/2022 0800 by Shaaron Adler, RN  Skin Protection:   adhesive use limited   skin-to-device areas padded  Intervention: Prevent and Manage VTE (Venous Thromboembolism) Risk  Recent Flowsheet Documentation  Taken 01/03/2022 0800 by Shaaron Adler, RN  VTE Prevention/Management: anticoagulant therapy maintained  Intervention: Prevent Infection  Recent Flowsheet Documentation  Taken 01/03/2022 0800 by Shaaron Adler, RN  Infection Prevention:   barrier precautions utilized   equipment surfaces disinfected   glycemic control managed   personal protective equipment utilized   promote handwashing   rest/sleep promoted  Goal: Optimal Comfort and Wellbeing  Outcome: Ongoing (see interventions/notes)  Goal: Rounds/Family Conference  Outcome: Ongoing (see interventions/notes)     Problem: Fall Injury Risk  Goal: Absence of Fall and Fall-Related Injury  Outcome: Ongoing (see  interventions/notes)  Intervention: Promote Injury-Free Environment  Recent Flowsheet Documentation  Taken 01/03/2022 0800 by Shaaron Adler, RN  Safety Promotion/Fall Prevention:   activity supervised   fall prevention program maintained   safety round/check completed     Problem: Diabetic Ketoacidosis  Goal: Optimal Coping  Outcome: Ongoing (see interventions/notes)  Goal: Fluid and Electrolyte Balance with Absence of Ketosis  Outcome: Ongoing (see interventions/notes)     Problem: Skin Injury Risk Increased  Goal: Skin Health and Integrity  Outcome: Ongoing (see interventions/notes)  Intervention: Optimize Skin Protection  Recent Flowsheet Documentation  Taken 01/03/2022 0800 by Shaaron Adler, RN  Pressure Reduction Techniques:   (L,M,H,VH) Turn Schedule - Minimum every 2 hours   frequent weight shift encouraged  Pressure Reduction Devices:   (M,H,VH) Use Repositioning Devices or Pillows   heels elevated off bed  Skin Protection:   adhesive use limited   skin-to-device areas padded

## 2022-01-03 NOTE — Nurses Notes (Signed)
Patient reported feeling "off" and sweaty. BG checked: 49 on left hand, 55 on right hand. Patient was given apple juice, peanut butter, and D50 per MAR. Will recheck BG and continue to treat as needed.

## 2022-01-03 NOTE — Care Management Notes (Signed)
[facility] does not have a bed available at this time and doe not know when there will be one. CM submitted referral to other facilities for review. CM notified Pt and family.

## 2022-01-03 NOTE — Care Plan (Signed)
Problem: Health Knowledge, Opportunity to Enhance (Adult,Obstetrics,Pediatric)  Goal: Knowledgeable about Health Subject/Topic  Description: Patient will demonstrate the desired outcomes by discharge/transition of care.  Outcome: Ongoing (see interventions/notes)     Problem: Adult Inpatient Plan of Care  Goal: Plan of Care Review  Outcome: Ongoing (see interventions/notes)  Goal: Patient-Specific Goal (Individualized)  Outcome: Ongoing (see interventions/notes)  Flowsheets (Taken 01/02/2022 2150)  Individualized Care Needs: assistance w/ ADLs  Anxieties, Fears or Concerns: BG management  Patient-Specific Goals (Include Timeframe): d/c soon  Goal: Absence of Hospital-Acquired Illness or Injury  Outcome: Ongoing (see interventions/notes)  Intervention: Prevent Skin Injury  Recent Flowsheet Documentation  Taken 01/03/2022 0600 by Thane Edu, RN  Body Position: positioned/repositioned independently  Taken 01/03/2022 0400 by Thane Edu, RN  Body Position: positioned/repositioned independently  Taken 01/03/2022 0200 by Thane Edu, RN  Body Position: positioned/repositioned independently  Taken 01/03/2022 0000 by Thane Edu, RN  Body Position: positioned/repositioned independently  Taken 01/02/2022 2200 by Thane Edu, RN  Body Position: positioned/repositioned independently  Taken 01/02/2022 2000 by Thane Edu, RN  Body Position: positioned/repositioned independently  Goal: Optimal Comfort and Wellbeing  Outcome: Ongoing (see interventions/notes)  Goal: Rounds/Family Conference  Outcome: Ongoing (see interventions/notes)     Problem: Fall Injury Risk  Goal: Absence of Fall and Fall-Related Injury  Outcome: Ongoing (see interventions/notes)     Problem: Diabetic Ketoacidosis  Goal: Optimal Coping  Outcome: Ongoing (see interventions/notes)  Goal: Fluid and Electrolyte Balance with Absence of Ketosis  Outcome: Ongoing (see interventions/notes)     Problem: Skin Injury Risk Increased  Goal: Skin Health and  Integrity  Outcome: Ongoing (see interventions/notes)

## 2022-01-04 LAB — POC BLOOD GLUCOSE (RESULTS)
GLUCOSE, POC: 351 mg/dl (ref 50–500)
GLUCOSE, POC: 46 mg/dl — CL (ref 50–500)
GLUCOSE, POC: 151 mg/dl (ref 50–500)
GLUCOSE, POC: 292 mg/dl (ref 50–500)
GLUCOSE, POC: 235 mg/dl (ref 50–500)
GLUCOSE, POC: 91 mg/dl (ref 50–500)
GLUCOSE, POC: 198 mg/dl (ref 50–500)
GLUCOSE, POC: 479 mg/dl (ref 50–500)
GLUCOSE, POC: 494 mg/dl (ref 50–500)

## 2022-01-04 NOTE — Care Plan (Signed)
Problem: Health Knowledge, Opportunity to Enhance (Adult,Obstetrics,Pediatric)  Goal: Knowledgeable about Health Subject/Topic  Description: Patient will demonstrate the desired outcomes by discharge/transition of care.  Outcome: Ongoing (see interventions/notes)     Problem: Adult Inpatient Plan of Care  Goal: Plan of Care Review  Outcome: Ongoing (see interventions/notes)  Goal: Patient-Specific Goal (Individualized)  Outcome: Ongoing (see interventions/notes)  Flowsheets (Taken 01/03/2022 2230)  Individualized Care Needs: assistance w/ ADLs  Anxieties, Fears or Concerns: BG management  Patient-Specific Goals (Include Timeframe): d/c soon  Goal: Absence of Hospital-Acquired Illness or Injury  Outcome: Ongoing (see interventions/notes)  Intervention: Prevent Skin Injury  Recent Flowsheet Documentation  Taken 01/04/2022 0400 by Thane Edu, RN  Body Position: positioned/repositioned independently  Taken 01/04/2022 0200 by Thane Edu, RN  Body Position: positioned/repositioned independently  Taken 01/03/2022 2200 by Thane Edu, RN  Body Position: positioned/repositioned independently  Taken 01/03/2022 2000 by Thane Edu, RN  Body Position: positioned/repositioned independently  Goal: Optimal Comfort and Wellbeing  Outcome: Ongoing (see interventions/notes)  Goal: Rounds/Family Conference  Outcome: Ongoing (see interventions/notes)     Problem: Fall Injury Risk  Goal: Absence of Fall and Fall-Related Injury  Outcome: Ongoing (see interventions/notes)     Problem: Diabetic Ketoacidosis  Goal: Optimal Coping  Outcome: Ongoing (see interventions/notes)  Goal: Fluid and Electrolyte Balance with Absence of Ketosis  Outcome: Ongoing (see interventions/notes)     Problem: Skin Injury Risk Increased  Goal: Skin Health and Integrity  Outcome: Ongoing (see interventions/notes)

## 2022-01-04 NOTE — Progress Notes (Signed)
Jordan Buckley    HOSPITALIST PROGRESS NOTE    Jordan Buckley  Date of service: 01/04/2022  Date of Admission:  12/23/2021  Hospital Day:  LOS: 12 days     Subjective:   Patient calmly lying in bed awake unable to answer questions appropriately.  No chest pain , fever or any other new complaints reported by patient or nursing staff.      Vital Signs:  Filed Vitals:    01/03/22 2156 01/03/22 2334 01/04/22 0826 01/04/22 0934   BP: (!) 111/46  (!) 123/53    Pulse: 78  79    Resp: _0 Temp: 36.8 C (98.3 F)  36.8 C (98.3 F)    SpO2: 97%  98%         Physical Exam:  General:  Patient in NAD, resting in bed, no visitors present  Head:  Normocephalic, atraumatic  Eyes:  PERRL, anicteric sclera  ENT:  Oral mucosa moist, no nasal discharge .  Oxygen nasal cannula in place.  Neck:  Soft, supple, trachea midline  Heart:  RRR, S1 and S2 normal  Lungs:  Unlabored respirations.  Lungs are clear to auscultation bilaterally, with no wheezes, no rales, no conversational dyspnea  Abdomen:  Soft, active bowel sounds, non-tender to palpation, non-distended  Extremities:  Pulses equal bilaterally.  Capillary refill less than 3 seconds.  No edema in lower extremities bilaterally.  Musculoskeletal: Lower back tenderness.  Skin:  Warm and dry, not diaphoretic.  No ecchymosis noted.   Neuro:  A&O x 3.  No focal deficits.  Speech intact  Psych:  Cooperative, not agitated    Intake & Output:    Intake/Output Summary (Last 24 hours) at 01/04/2022 1029  Last data filed at 01/04/2022 0953  Gross per 24 hour   Intake 1180 ml   Output --   Net 1180 ml     I/O current shift:  05/27 0700 - 05/27 1859  In: 120 [P.O.:120]  Out: -   Emesis:    BM:    Date of Last Bowel Movement: 01/04/22  Heme:      acetaminophen (TYLENOL) tablet, 650 mg, Oral, Q4H PRN  alcohol 62 % (NOZIN NASAL SANITIZER) nasal solution, 1 Each, Each Nostril, 2x/day  amLODIPine (NORVASC) tablet, 10 mg, Oral, Daily  apixaban (ELIQUIS) tablet,  2.5 mg, Oral, 2x/day  atorvastatin (LIPITOR) tablet, 20 mg, Oral, QPM  dextrose 50% (0.5 g/mL) injection - syringe, 25 g, Intravenous, Q15 Min PRN   Or  dextrose 50% (0.5 g/mL) injection - syringe, 12.5 g, Intravenous, Q15 Min PRN   Or  glucagon (GLUCAGEN DIAGNOSTIC KIT) injection 1 mg, 1 mg, Subcutaneous, Q15 Min PRN   Or  glucagon (GLUCAGEN DIAGNOSTIC KIT) injection 1 mg, 1 mg, IntraMUSCULAR, Q15 Min PRN  docusate sodium (COLACE) capsule, 100 mg, Oral, Daily  ferrous sulfate 324 mg (65 mg elemental IRON) tablet, 324 mg, Oral, Daily before Breakfast  HYDROcodone-acetaminophen (NORCO) 5-325 mg per tablet, 1 Tablet, Oral, Q6H PRN  insulin glargine 100 units/mL injection, 8 Units, Subcutaneous, NIGHTLY  levothyroxine (SYNTHROID) tablet, 75 mcg, Oral, QAM  magnesium oxide (MAG-OX) 436m (2464melemental magnesium) tablet, 400 mg, Oral, 2x/day  miconazole (MONISTAT) 2% vaginal cream, 1 Applicator, Topical, 2x/day  ondansetron (ZOFRAN ODT) rapid dissolve tablet, 4 mg, Oral, Q8H PRN  pramipexole (MIRAPEX) tablet, 0.125 mg, Oral, QPM  SSIP insulin R human (HUMULIN R) 100 units/mL injection, 0-12 Units, Subcutaneous, Q4H PRN  Labs:  Recent Results (from the past 48 hour(s))   CBC WITH DIFF    Collection Time: 01/03/22  3:15 AM   Result Value    WBC 5.4    HGB 10.9 (L)    HCT 32.7 (L)    PLATELETS 451 (H)      Results for orders placed or performed during the hospital encounter of 12/23/21 (from the past 48 hour(s))   BASIC METABOLIC PANEL    Collection Time: 01/03/22  3:15 AM   Result Value    SODIUM 135 (L)    POTASSIUM 4.6    CHLORIDE 97 (L)    CO2 TOTAL 32 (H)    GLUCOSE 49 (LL)    BUN 19    CREATININE 0.64      No results found for this or any previous visit (from the past 48 hour(s)).   No results found for this or any previous visit (from the past 48 hour(s)).   No results found for this or any previous visit (from the past 48 hour(s)).   Results for orders placed or performed in visit on 12/23/21 (from the  past 1344 hour(s))   HGA1C (HEMOGLOBIN A1C WITH EST AVG GLUCOSE)    Collection Time: 12/23/21  5:42 AM   Result Value    HEMOGLOBIN A1C 9.6 (H)      No results found for this or any previous visit (from the past 48 hour(s)).     Microbiology:  No results found for any visits on 12/23/21 (from the past 96 hour(s)).    Imaging:   CT BRAIN WO IV CONTRAST  Narrative: Jordan Buckley    PROCEDURE DESCRIPTION: CT OF THE HEAD WITHOUT INTRAVENOUS CONTRAST    CLINICAL HISTORY: Altered mental status; slurred speech       COMPARISON: 12/23/2021    TECHNIQUE: A CT of the head was performed utilizing contiguous axial imaging. No intravenous contrast material was injected.    FINDINGS: The ventricular system, cortical sulci, and basilar cisterns appear mildly to moderately  dilated consistent with mild to moderate cerebral and cerebellar volume loss. No evidence of an acute intracranial hemorrhage is seen. No mass effect or midline shift is noted. No evidence of an acute cerebrovascular accident is seen.  Chronic small vessel ischemic changes are identified in the periventricular deep white matter and centrum semiovale..  Chronic small vessel ischemic changes are identified in the periventricular deep white matter and centrum semiovale.  The paranasal sinuses and mastoid air cells are within normal appearance.  Impression: No evidence of an acute intracranial hemorrhage is noted.    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 rate has increased BY  43 BPM  Serial changes of Septal infarct present  Confirmed by Rana, Shahid ([id number]) on 12/25/2021 8:43:27 AM  XR AP MOBILE CHEST  Narrative: Jordan Buckley    PROCEDURE DESCRIPTION: XR PORTABLE CHEST X-RAY    CLINICAL HISTORY:hypoxemia    COMPARISON:12/23/2021         FINDINGS:  A single view of the chest was obtained. No focal alveolar infiltrate is identified. The bronchovascular markings appear unchanged. The patient is status post median sternotomy. The heart size is within limits of normal. No pleural effusion or pneumothorax is seen.  Mild elevation of the right hemidiaphragm is noted. Mild degenerative change of the mid to lower thoracic spine is seen.  Impression: No focal alveolar infiltrate is identified.    Radiologist location ID: [id number]        Assessment/ Plan:   Active Hospital Problems   (*Primary Problem)    Diagnosis   . *DKA (diabetic ketoacidosis) (CMS HCC)   . Hyperkalemia   . Hyponatremia   . AKI (acute kidney injury) (CMS HCC)   . Acute metabolic encephalopathy   . DKA, type 2 (CMS [place])     -Type 2 diabetes mellitus with DKA  Currently DKA resolved  Continue with Lantus and insulin sliding scale with Lantus and fingerstick blood sugar a.c. HS  Hemoglobin A1c 9.6 on 12/23/21  Diabetic teaching offered    -acute kidney injury  This resolved will continue to monitor BMP    -Acute metabolic encephalopathy likely due to DKA with underlying dementia  Mental status appears to be at baseline currently.  She does not seem to be on any home medications for dementia    -Hypertension, controlled  She will be on Norvasc, to adjust as blood pressure responds    -Hypothyroidism  Continue Synthroid    -T12 and L1 compression fracture  T12 status post kyphoplasty  L1 compression fracture plan was for kyphoplasty as outpatient by IR.  Pain control.    DVT prophylaxis, heparin  Code status, full code    Plan of care discussed with the daughters at bedside verbalized understanding and agreed.  They report that they want her to go to a different facility not where she came from.    Disposition Planning:  Still awaiting placement.  Will follow up with case management.    Norman Herrlich, MD  01/04/2022  [place] HOSPITALIST

## 2022-01-04 NOTE — Care Plan (Signed)
Problem: Health Knowledge, Opportunity to Enhance (Adult,Obstetrics,Pediatric)  Goal: Knowledgeable about Health Subject/Topic  Description: Patient will demonstrate the desired outcomes by discharge/transition of care.  Outcome: Ongoing (see interventions/notes)     Problem: Adult Inpatient Plan of Care  Goal: Plan of Care Review  Outcome: Ongoing (see interventions/notes)  Goal: Patient-Specific Goal (Individualized)  Outcome: Ongoing (see interventions/notes)  Goal: Absence of Hospital-Acquired Illness or Injury  Outcome: Ongoing (see interventions/notes)  Intervention: Prevent Skin Injury  Recent Flowsheet Documentation  Taken 01/04/2022 1200 by Shaaron Adler, RN  Body Position: turned q 2 hours  Taken 01/04/2022 0800 by Shaaron Adler, RN  Body Position: turned q 2 hours  Goal: Optimal Comfort and Wellbeing  Outcome: Ongoing (see interventions/notes)  Goal: Rounds/Family Conference  Outcome: Ongoing (see interventions/notes)     Problem: Fall Injury Risk  Goal: Absence of Fall and Fall-Related Injury  Outcome: Ongoing (see interventions/notes)     Problem: Diabetic Ketoacidosis  Goal: Optimal Coping  Outcome: Ongoing (see interventions/notes)  Goal: Fluid and Electrolyte Balance with Absence of Ketosis  Outcome: Ongoing (see interventions/notes)     Problem: Skin Injury Risk Increased  Goal: Skin Health and Integrity  Outcome: Ongoing (see interventions/notes)

## 2022-01-04 NOTE — Nurses Notes (Signed)
Patient ate a carb and protein this HS.

## 2022-01-05 LAB — CBC WITH DIFF
BASOPHIL #: 0.1 10*3/uL (ref 0.00–0.30)
BASOPHIL %: 1 % (ref 0–3)
EOSINOPHIL #: 0.1 10*3/uL (ref 0.00–0.80)
EOSINOPHIL %: 1 % (ref 0–7)
HCT: 32.2 % — ABNORMAL LOW (ref 37.0–47.0)
HGB: 10.7 g/dL — ABNORMAL LOW (ref 12.5–16.0)
LYMPHOCYTE #: 1.3 10*3/uL (ref 1.10–5.00)
LYMPHOCYTE %: 21 % — ABNORMAL LOW (ref 25–45)
MCH: 30 pg (ref 27.0–32.0)
MCHC: 33.1 g/dL (ref 32.0–36.0)
MCV: 90.6 fL (ref 78.0–99.0)
MONOCYTE #: 0.4 10*3/uL (ref 0.00–1.30)
MONOCYTE %: 6 % (ref 0–12)
MPV: 5.9 fL — ABNORMAL LOW (ref 7.4–10.4)
NEUTROPHIL #: 4.4 10*3/uL (ref 1.80–8.40)
NEUTROPHIL %: 71 % (ref 40–76)
PLATELETS: 401 10*3/uL (ref 140–440)
RBC: 3.55 10*6/uL — ABNORMAL LOW (ref 4.20–5.40)
RDW: 14.8 % (ref 11.6–14.8)
WBC: 6.3 10*3/uL (ref 4.0–10.5)
WBCS UNCORRECTED: 6.3 10*3/uL

## 2022-01-05 LAB — BASIC METABOLIC PANEL
ANION GAP: 6 mmol/L — ABNORMAL LOW (ref 10–20)
BUN/CREA RATIO: 32 — ABNORMAL HIGH (ref 6–22)
BUN: 24 mg/dL (ref 7–25)
CALCIUM: 9.1 mg/dL (ref 8.6–10.3)
CHLORIDE: 98 mmol/L (ref 98–107)
CO2 TOTAL: 32 mmol/L — ABNORMAL HIGH (ref 21–31)
CREATININE: 0.76 mg/dL (ref 0.60–1.30)
ESTIMATED GFR: 79 mL/min/{1.73_m2} (ref >59)
GLUCOSE: 140 mg/dL — ABNORMAL HIGH (ref 74–109)
OSMOLALITY, CALCULATED: 278 mOsm/kg (ref 270–290)
POTASSIUM: 4.5 mmol/L (ref 3.5–5.1)
SODIUM: 136 mmol/L (ref 136–145)

## 2022-01-05 LAB — POC BLOOD GLUCOSE (RESULTS)
GLUCOSE, POC: 159 mg/dl (ref 50–500)
GLUCOSE, POC: 130 mg/dl (ref 50–500)
GLUCOSE, POC: 443 mg/dl (ref 50–500)
GLUCOSE, POC: 257 mg/dl (ref 50–500)
GLUCOSE, POC: 255 mg/dl (ref 50–500)
GLUCOSE, POC: 329 mg/dl (ref 50–500)

## 2022-01-05 LAB — MAGNESIUM: MAGNESIUM: 1.8 mg/dL — ABNORMAL LOW (ref 1.9–2.7)

## 2022-01-05 NOTE — Progress Notes (Signed)
Jordan Buckley    HOSPITALIST PROGRESS NOTE    Jordan Buckley  Date of service: 01/05/2022  Date of Admission:  12/23/2021  Hospital Day:  LOS: 13 days     Subjective:   Patient remains calm in bed answering questions appropriately.  She denies back pain focal weakness bowel or bladder incontinence. No chest pain , fever or any other new complaints reported by patient or nursing staff.  She awaits placement.    Vital Signs:  Filed Vitals:    01/04/22 2319 01/05/22 0043 01/05/22 0801 01/05/22 0945   BP:  115/68 107/76    Pulse: 93 88 81    Resp:  _0 Temp:  36.7 C (98.1 F) 36.1 C (96.9 F)    SpO2:  99% 95%         Physical Exam:  General:  Patient in NAD, resting in bed, no visitors present  Head:  Normocephalic, atraumatic  Eyes:  PERRL, anicteric sclera  ENT:  Oral mucosa moist, no nasal discharge .  Oxygen nasal cannula in place.  Neck:  Soft, supple, trachea midline  Heart:  RRR, S1 and S2 normal  Lungs:  Unlabored respirations.  Lungs are clear to auscultation bilaterally, with no wheezes, no rales, no conversational dyspnea  Abdomen:  Soft, active bowel sounds, non-tender to palpation, non-distended  Extremities:  Pulses equal bilaterally.  Capillary refill less than 3 seconds.  No edema in lower extremities bilaterally.   Musculoskeletal: Lower back tenderness.  Skin:  Warm and dry, not diaphoretic.  No ecchymosis noted.   Neuro:  A&O x 3.  No focal deficits.  Speech intact  Psych:  Cooperative, not agitated    Intake & Output:    Intake/Output Summary (Last 24 hours) at 01/05/2022 1150  Last data filed at 01/05/2022 1033  Gross per 24 hour   Intake 1060 ml   Output --   Net 1060 ml     I/O current shift:  05/28 0700 - 05/28 1859  In: 360 [P.O.:360]  Out: -   Emesis:    BM:    Date of Last Bowel Movement: 01/04/22  Heme:      acetaminophen (TYLENOL) tablet, 650 mg, Oral, Q4H PRN  alcohol 62 % (NOZIN NASAL SANITIZER) nasal solution, 1 Each, Each Nostril,  2x/day  amLODIPine (NORVASC) tablet, 10 mg, Oral, Daily  apixaban (ELIQUIS) tablet, 2.5 mg, Oral, 2x/day  atorvastatin (LIPITOR) tablet, 20 mg, Oral, QPM  dextrose 50% (0.5 g/mL) injection - syringe, 25 g, Intravenous, Q15 Min PRN   Or  dextrose 50% (0.5 g/mL) injection - syringe, 12.5 g, Intravenous, Q15 Min PRN   Or  glucagon (GLUCAGEN DIAGNOSTIC KIT) injection 1 mg, 1 mg, Subcutaneous, Q15 Min PRN   Or  glucagon (GLUCAGEN DIAGNOSTIC KIT) injection 1 mg, 1 mg, IntraMUSCULAR, Q15 Min PRN  docusate sodium (COLACE) capsule, 100 mg, Oral, Daily  ferrous sulfate 324 mg (65 mg elemental IRON) tablet, 324 mg, Oral, Daily before Breakfast  HYDROcodone-acetaminophen (NORCO) 5-325 mg per tablet, 1 Tablet, Oral, Q6H PRN  insulin glargine 100 units/mL injection, 8 Units, Subcutaneous, NIGHTLY  levothyroxine (SYNTHROID) tablet, 75 mcg, Oral, QAM  magnesium oxide (MAG-OX) 445m (2425melemental magnesium) tablet, 400 mg, Oral, 2x/day  miconazole (MONISTAT) 2% vaginal cream, 1 Applicator, Topical, 2x/day  ondansetron (ZOFRAN ODT) rapid dissolve tablet, 4 mg, Oral, Q8H PRN  pramipexole (MIRAPEX) tablet, 0.125 mg, Oral, QPM  SSIP insulin R human (HUMULIN R) 100 units/mL injection, 0-12  Units, Subcutaneous, Q4H PRN          Labs:  Recent Results (from the past 48 hour(s))   CBC WITH DIFF    Collection Time: 01/05/22  3:18 AM   Result Value    WBC 6.3    HGB 10.7 (L)    HCT 32.2 (L)    PLATELETS 401      Results for orders placed or performed during the hospital encounter of 12/23/21 (from the past 48 hour(s))   BASIC METABOLIC PANEL    Collection Time: 01/05/22  3:18 AM   Result Value    SODIUM 136    POTASSIUM 4.5    CHLORIDE 98    CO2 TOTAL 32 (H)    GLUCOSE 140 (H)    BUN 24    CREATININE 0.76      No results found for this or any previous visit (from the past 48 hour(s)).   No results found for this or any previous visit (from the past 48 hour(s)).   No results found for this or any previous visit (from the past 48 hour(s)).    Results for orders placed or performed in visit on 12/23/21 (from the past 1344 hour(s))   HGA1C (HEMOGLOBIN A1C WITH EST AVG GLUCOSE)    Collection Time: 12/23/21  5:42 AM   Result Value    HEMOGLOBIN A1C 9.6 (H)      No results found for this or any previous visit (from the past 48 hour(s)).     Microbiology:  No results found for any visits on 12/23/21 (from the past 96 hour(s)).    Imaging:   CT BRAIN WO IV CONTRAST  Narrative: Jordan Buckley    PROCEDURE DESCRIPTION: CT OF THE HEAD WITHOUT INTRAVENOUS CONTRAST    CLINICAL HISTORY: Altered mental status; slurred speech       COMPARISON: 12/23/2021    TECHNIQUE: A CT of the head was performed utilizing contiguous axial imaging. No intravenous contrast material was injected.    FINDINGS: The ventricular system, cortical sulci, and basilar cisterns appear mildly to moderately  dilated consistent with mild to moderate cerebral and cerebellar volume loss. No evidence of an acute intracranial hemorrhage is seen. No mass effect or midline shift is noted. No evidence of an acute cerebrovascular accident is seen.  Chronic small vessel ischemic changes are identified in the periventricular deep white matter and centrum semiovale..  Chronic small vessel ischemic changes are identified in the periventricular deep white matter and centrum semiovale.  The paranasal sinuses and mastoid air cells are within normal appearance.  Impression: No evidence of an acute intracranial hemorrhage is noted.    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 rate has increased BY  43 BPM  Serial changes of Septal infarct present  Confirmed by Rana, Shahid ([id number]) on 12/25/2021 8:43:27 AM  XR AP MOBILE CHEST  Narrative: Jordan Buckley    PROCEDURE DESCRIPTION: XR PORTABLE CHEST  X-RAY    CLINICAL HISTORY:hypoxemia    COMPARISON:12/23/2021        FINDINGS:  A single view of the chest was obtained. No focal alveolar infiltrate is identified. The bronchovascular markings appear unchanged. The patient is status post median sternotomy. The heart size is within limits of normal.  No pleural effusion or pneumothorax is seen.    Mild elevation of the right hemidiaphragm is noted. Mild degenerative change of the mid to lower thoracic spine is seen.  Impression: No focal alveolar infiltrate is identified.    Radiologist location ID: [id number]        Assessment/ Plan:   Active Hospital Problems   (*Primary Problem)    Diagnosis   . *DKA (diabetic ketoacidosis) (CMS HCC)   . Hyperkalemia   . Hyponatremia   . AKI (acute kidney injury) (CMS HCC)   . Acute metabolic encephalopathy   . DKA, type 2 (CMS [place])     -Type 2 diabetes mellitus with DKA  Currently DKA resolved  Continue with Lantus and insulin sliding scale with fingerstick blood sugar a.c. HS  Hemoglobin A1c 9.6 on 12/23/21  Diabetic teaching offered    -Acute kidney injury  This resolved will continue to monitor BMP    -Acute metabolic encephalopathy likely due to DKA with underlying dementia  Mental status appears to be at baseline currently.  She does not seem to be on any home medications for dementia    -Hypertension, controlled  She will be on Norvasc, to adjust as blood pressure responds    -Hypothyroidism  Continue Synthroid    -T12 and L1 compression fracture  T12 status post kyphoplasty  L1 compression fracture plan was for kyphoplasty as outpatient by IR.  Pain control.  She reports pain is controlled currently.    DVT prophylaxis, heparin  Code status, full code    Plan of care discussed with the daughters at bedside verbalized understanding and agreed.  They report that they want her to go to a different facility not where she came from.    Disposition Planning:  Still awaiting placement.  Will follow up with case management.    Norman Herrlich, MD  01/05/2022  [facility] MEDICINE HOSPITALIST

## 2022-01-05 NOTE — Nurses Notes (Signed)
FULL BED BATH AND LINEN CHANGE AT THIS TIME. PUERWICK APPLIED TO HELP WITH UNCONSCIOUS INCONTINENCE AND SKIN INTEGRITY. PATIENT SKIN IS WDL.

## 2022-01-05 NOTE — Care Plan (Signed)
Problem: Health Knowledge, Opportunity to Enhance (Adult,Obstetrics,Pediatric)  Goal: Knowledgeable about Health Subject/Topic  Description: Patient will demonstrate the desired outcomes by discharge/transition of care.  Outcome: Ongoing (see interventions/notes)     Problem: Adult Inpatient Plan of Care  Goal: Plan of Care Review  Outcome: Ongoing (see interventions/notes)  Goal: Patient-Specific Goal (Individualized)  Outcome: Ongoing (see interventions/notes)  Goal: Absence of Hospital-Acquired Illness or Injury  Outcome: Ongoing (see interventions/notes)  Intervention: Identify and Manage Fall Risk  Recent Flowsheet Documentation  Taken 01/05/2022 0800 by Shaaron Adler, RN  Safety Promotion/Fall Prevention:   activity supervised   fall prevention program maintained   motion sensor pad activated   safety round/check completed  Intervention: Prevent Skin Injury  Recent Flowsheet Documentation  Taken 01/05/2022 1000 by Shaaron Adler, RN  Body Position: turned q 2 hours  Taken 01/05/2022 0800 by Shaaron Adler, RN  Body Position: turned q 2 hours  Skin Protection:   adhesive use limited   skin-to-device areas padded  Intervention: Prevent and Manage VTE (Venous Thromboembolism) Risk  Recent Flowsheet Documentation  Taken 01/05/2022 0800 by Shaaron Adler, RN  VTE Prevention/Management: anticoagulant therapy maintained  Intervention: Prevent Infection  Recent Flowsheet Documentation  Taken 01/05/2022 0800 by Shaaron Adler, RN  Infection Prevention:   barrier precautions utilized   environmental surveillance performed   equipment surfaces disinfected   glycemic control managed   personal protective equipment utilized   promote handwashing   rest/sleep promoted  Goal: Optimal Comfort and Wellbeing  Outcome: Ongoing (see interventions/notes)  Goal: Rounds/Family Conference  Outcome: Ongoing (see interventions/notes)     Problem: Fall Injury Risk  Goal: Absence of Fall and Fall-Related Injury  Outcome: Ongoing (see  interventions/notes)  Intervention: Promote Injury-Free Environment  Recent Flowsheet Documentation  Taken 01/05/2022 0800 by Shaaron Adler, RN  Safety Promotion/Fall Prevention:   activity supervised   fall prevention program maintained   motion sensor pad activated   safety round/check completed     Problem: Diabetic Ketoacidosis  Goal: Optimal Coping  Outcome: Ongoing (see interventions/notes)  Goal: Fluid and Electrolyte Balance with Absence of Ketosis  Outcome: Ongoing (see interventions/notes)     Problem: Skin Injury Risk Increased  Goal: Skin Health and Integrity  Outcome: Ongoing (see interventions/notes)  Intervention: Optimize Skin Protection  Recent Flowsheet Documentation  Taken 01/05/2022 0800 by Shaaron Adler, RN  Pressure Reduction Techniques:   (L,M,H,VH)Frequent Turning   (L,M,H,VH) Turn Schedule - Minimum every 2 hours   frequent weight shift encouraged   heels elevated off bed  Pressure Reduction Devices: (M,H,VH) Use Repositioning Devices or Pillows  Skin Protection:   adhesive use limited   skin-to-device areas padded

## 2022-01-06 DIAGNOSIS — Z9981 Dependence on supplemental oxygen: Secondary | ICD-10-CM

## 2022-01-06 LAB — BASIC METABOLIC PANEL
ANION GAP: 4 mmol/L — ABNORMAL LOW (ref 10–20)
BUN/CREA RATIO: 33 — ABNORMAL HIGH (ref 6–22)
BUN: 23 mg/dL (ref 7–25)
CALCIUM: 9.3 mg/dL (ref 8.6–10.3)
CHLORIDE: 96 mmol/L — ABNORMAL LOW (ref 98–107)
CO2 TOTAL: 31 mmol/L (ref 21–31)
CREATININE: 0.7 mg/dL (ref 0.60–1.30)
ESTIMATED GFR: 87 mL/min/{1.73_m2} (ref >59)
GLUCOSE: 208 mg/dL — ABNORMAL HIGH (ref 74–109)
OSMOLALITY, CALCULATED: 273 mOsm/kg (ref 270–290)
POTASSIUM: 4.9 mmol/L (ref 3.5–5.1)
SODIUM: 131 mmol/L — ABNORMAL LOW (ref 136–145)

## 2022-01-06 LAB — POC BLOOD GLUCOSE (RESULTS)
GLUCOSE, POC: 251 mg/dl (ref 50–500)
GLUCOSE, POC: 346 mg/dl (ref 50–500)
GLUCOSE, POC: 350 mg/dl (ref 50–500)
GLUCOSE, POC: 457 mg/dl (ref 50–500)
GLUCOSE, POC: 460 mg/dl (ref 50–500)
GLUCOSE, POC: 93 mg/dl (ref 50–500)

## 2022-01-06 LAB — MAGNESIUM: MAGNESIUM: 1.7 mg/dL — ABNORMAL LOW (ref 1.9–2.7)

## 2022-01-06 MED ORDER — MAGNESIUM SULFATE 1 GRAM/100 ML IN DEXTROSE 5 % INTRAVENOUS PIGGYBACK
1.0000 g | INJECTION | Freq: Once | INTRAVENOUS | Status: AC
Start: 2022-01-06 — End: 2022-01-06
  Administered 2022-01-06: 1 g via INTRAVENOUS
  Administered 2022-01-06: 0 g via INTRAVENOUS
  Filled 2022-01-06: qty 100

## 2022-01-06 MED ORDER — INSULIN REGULAR HUMAN 100 UNIT/ML INJECTION SSIP
0.0000 [IU] | INJECTION | Freq: Three times a day (TID) | SUBCUTANEOUS | Status: DC
Start: 2022-01-06 — End: 2022-01-08
  Administered 2022-01-06: 5 [IU] via SUBCUTANEOUS
  Administered 2022-01-07: 4 [IU] via SUBCUTANEOUS
  Administered 2022-01-07 (×2): 0 [IU] via SUBCUTANEOUS
  Administered 2022-01-08: 9 [IU] via SUBCUTANEOUS
  Administered 2022-01-08: 6 [IU] via SUBCUTANEOUS
  Administered 2022-01-08: 9 [IU] via SUBCUTANEOUS
  Filled 2022-01-06: qty 18
  Filled 2022-01-06: qty 15
  Filled 2022-01-06 (×3): qty 27

## 2022-01-06 NOTE — PT Treatment (Signed)
[facility]  [place], [address]  [phone]  [phone]  Rehabilitation Department  Physical Therapy Daily Inpatient Note    Date: 01/06/2022  Patient's Name: Jordan Buckley  Date of Birth: [birth year]  Height: Height: 157.5 cm ('5\' 2"'$ )  Weight: Weight: 55 kg (121 lb 3.2 oz)      Plan: Will continue under current POC.         Subjective/Objective/Assessment:  Flowsheet *   01/06/22 1110   Rehab Session   Document Type therapy progress note (daily note)   Total PT Minutes: 23   Patient Effort adequate   General Information   Patient Profile Reviewed yes   Medical Lines Telemetry  (purewick)   Pre Treatment Status   Pre Treatment Patient Status Nurse approved session;Patient supine in bed   Cognitive Assessment/Interventions   Behavior/Mood Observations behavior appropriate to situation, WNL/WFL   Orientation Status oriented x 3   Attention WNL/WFL   Follows Commands WNL   Vital Signs   Pre-Treatment Heart Rate (beats/min) 80   Post-treatment Heart Rate (beats/min) 85   Pre SpO2 (%) 98   O2 Delivery Pre Treatment supplemental O2   Post SpO2 (%) 99   O2 Delivery Post Treatment supplemental O2   Bed Mobility Assessment/Treatment   Bed Mobility, Assistive Device bed rails;draw sheet   Supine-Sit Independence moderate assist (50% patient effort)   Sit to Supine, Independence moderate assist (50% patient effort)   Transfer Assessment/Treatment   Sit-Stand Independence minimum assist (75% patient effort)   Stand-Sit Independence minimum assist (75% patient effort)   Sit-Stand-Sit, Assist Device walker, front wheeled   Gait Assessment/Treatment   Total Distance Ambulated 2   Independence  minimum assist (75% patient effort)   Assistive Device  walker, front wheeled   Distance in Feet 2-3 steps up head of bed with RW and close CGA/min assist for safety   Comment pt stood x 2 reps and performed marching exs x 15 reps each   Balance Skill Training   Sitting  Balance: Static fair + balance   Sitting, Dynamic (Balance) fair + balance   Therapeutic Exercise/Activity   Comment B LE therx x 15 reps each to include SLR HS SAQ and AP with CGA/min assist   Post Treatment Status   Post Treatment Patient Status Patient supine in bed;Call light within reach;Telephone within reach;Sitter select activated   Physical Therapy Clinical Impression   Assessment pt with decrease strength bed mobility and transfers could benefit from rehab to max independence and return to PLOF     **            Intervention minutes: THERAPEUTIC EXERCISE 11 minutes and THERAPEUTIC ACTIVITY 12 minutes    [place], PTA  01/06/2022, 12:32

## 2022-01-06 NOTE — Nurses Notes (Signed)
Patient was given an HS snack of a carb and a protein.

## 2022-01-06 NOTE — Care Plan (Signed)
Pt resting comfortably in bed & watching TV. Patient is running sinus rhythm with a HR 87. Patient is an [place]. Patient received one dose of magnesium sulfate for a Mg of 1.7 mg/dL. Ongoing care continues and report given to oncoming shift. Bed check on and side rails x3.

## 2022-01-06 NOTE — Progress Notes (Addendum)
[place]    HOSPITALIST PROGRESS NOTE    Jordan Buckley  Date of service: 01/06/2022  Date of Admission:  12/23/2021  Hospital Day:  LOS: 14 days     Subjective:   Doing well , she had episode of hypoglycemia.    Her blood sugars went down to 40s.    Currently she is doing much better.  Her relatives that the bedside.    She denies any complaints.  Vital Signs:  Filed Vitals:    01/05/22 1930 01/05/22 2101 01/05/22 2305 01/06/22 0801   BP: (!) 106/93   133/63   Pulse: 88  87 84   Resp: '20 20  18   ' Temp: 36.8 C (98.2 F)   36.7 C (98.1 F)   SpO2: (!) 88%   99%        Physical Exam:  General:  Patient in NAD, resting in bed, no visitors present  Head:  Normocephalic, atraumatic  Eyes:  PERRL, anicteric sclera  ENT:  Oral mucosa moist, no nasal discharge .  Oxygen nasal cannula in place.  Neck:  Soft, supple, trachea midline  Heart:  RRR, S1 and S2 normal  Lungs:  Unlabored respirations.  Lungs are clear to auscultation bilaterally, with no wheezes, no rales, no conversational dyspnea  Abdomen:  Soft, active bowel sounds, non-tender to palpation, non-distended  Extremities:  Pulses equal bilaterally.  Capillary refill less than 3 seconds.  No edema in lower extremities bilaterally.   Musculoskeletal: Lower back tenderness.  Skin:  Warm and dry, not diaphoretic.  No ecchymosis noted.   Neuro:  A&O x 3.  No focal deficits.  Speech intact  Psych:  Cooperative, not agitated    Intake & Output:    Intake/Output Summary (Last 24 hours) at 01/06/2022 1153  Last data filed at 01/06/2022 1025  Gross per 24 hour   Intake 560 ml   Output 700 ml   Net -140 ml     I/O current shift:  05/29 0700 - 05/29 1859  In: 120 [P.O.:120]  Out: -   Emesis:    BM:    Date of Last Bowel Movement: 01/04/22  Heme:      acetaminophen (TYLENOL) tablet, 650 mg, Oral, Q4H PRN  alcohol 62 % (NOZIN NASAL SANITIZER) nasal solution, 1 Each, Each Nostril, 2x/day  amLODIPine (NORVASC) tablet, 10 mg, Oral,  Daily  apixaban (ELIQUIS) tablet, 2.5 mg, Oral, 2x/day  atorvastatin (LIPITOR) tablet, 20 mg, Oral, QPM  dextrose 50% (0.5 g/mL) injection - syringe, 25 g, Intravenous, Q15 Min PRN   Or  dextrose 50% (0.5 g/mL) injection - syringe, 12.5 g, Intravenous, Q15 Min PRN   Or  glucagon (GLUCAGEN DIAGNOSTIC KIT) injection 1 mg, 1 mg, Subcutaneous, Q15 Min PRN   Or  glucagon (GLUCAGEN DIAGNOSTIC KIT) injection 1 mg, 1 mg, IntraMUSCULAR, Q15 Min PRN  docusate sodium (COLACE) capsule, 100 mg, Oral, Daily  ferrous sulfate 324 mg (65 mg elemental IRON) tablet, 324 mg, Oral, Daily before Breakfast  HYDROcodone-acetaminophen (NORCO) 5-325 mg per tablet, 1 Tablet, Oral, Q6H PRN  insulin glargine 100 units/mL injection, 8 Units, Subcutaneous, NIGHTLY  levothyroxine (SYNTHROID) tablet, 75 mcg, Oral, QAM  magnesium oxide (MAG-OX) 465m (2488melemental magnesium) tablet, 400 mg, Oral, 2x/day  magnesium sulfate 1 G in D5W 100 mL premix IVPB, 1 g, Intravenous, Once  miconazole (MONISTAT) 2% vaginal cream, 1 Applicator, Topical, 2x/day  ondansetron (ZOFRAN ODT) rapid dissolve tablet, 4 mg, Oral, Q8H PRN  pramipexole (  MIRAPEX) tablet, 0.125 mg, Oral, QPM  SSIP insulin R human (HUMULIN R) 100 units/mL injection, 0-12 Units, Subcutaneous, 3x/day AC          Labs:  Recent Results (from the past 48 hour(s))   CBC WITH DIFF    Collection Time: 01/05/22  3:18 AM   Result Value    WBC 6.3    HGB 10.7 (L)    HCT 32.2 (L)    PLATELETS 401      Results for orders placed or performed during the hospital encounter of 12/23/21 (from the past 48 hour(s))   BASIC METABOLIC PANEL    Collection Time: 01/06/22  3:34 AM   Result Value    SODIUM 131 (L)    POTASSIUM 4.9    CHLORIDE 96 (L)    CO2 TOTAL 31    GLUCOSE 208 (H)    BUN 23    CREATININE 0.70      No results found for this or any previous visit (from the past 48 hour(s)).   No results found for this or any previous visit (from the past 48 hour(s)).   No results found for this or any previous visit  (from the past 48 hour(s)).   Results for orders placed or performed in visit on 12/23/21 (from the past 1344 hour(s))   HGA1C (HEMOGLOBIN A1C WITH EST AVG GLUCOSE)    Collection Time: 12/23/21  5:42 AM   Result Value    HEMOGLOBIN A1C 9.6 (H)      No results found for this or any previous visit (from the past 48 hour(s)).     Microbiology:  No results found for any visits on 12/23/21 (from the past 96 hour(s)).    Imaging:   CT BRAIN WO IV CONTRAST  Narrative: Jordan Buckley    PROCEDURE DESCRIPTION: CT OF THE HEAD WITHOUT INTRAVENOUS CONTRAST    CLINICAL HISTORY: Altered mental status; slurred speech       COMPARISON: 12/23/2021    TECHNIQUE: A CT of the head was performed utilizing contiguous axial imaging. No intravenous contrast material was injected.    FINDINGS: The ventricular system, cortical sulci, and basilar cisterns appear mildly to moderately  dilated consistent with mild to moderate cerebral and cerebellar volume loss. No evidence of an acute intracranial hemorrhage is seen. No mass effect or midline shift is noted. No evidence of an acute cerebrovascular accident is seen.  Chronic small vessel ischemic changes are identified in the periventricular deep white matter and centrum semiovale..  Chronic small vessel ischemic changes are identified in the periventricular deep white matter and centrum semiovale.  The paranasal sinuses and mastoid air cells are within normal appearance.  Impression: No evidence of an acute intracranial hemorrhage is noted.    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 rate has increased BY  43 BPM  Serial changes of Septal infarct present  Confirmed by Rana, Shahid ([id number]) on 12/25/2021 8:43:27 AM  XR AP MOBILE CHEST  Narrative: Jordan Buckley    PROCEDURE  DESCRIPTION: XR PORTABLE CHEST X-RAY    CLINICAL HISTORY:hypoxemia    COMPARISON:12/23/2021        FINDINGS:  A single view of the chest was obtained. No focal alveolar infiltrate is identified. The bronchovascular  markings appear unchanged. The patient is status post median sternotomy. The heart size is within limits of normal. No pleural effusion or pneumothorax is seen.    Mild elevation of the right hemidiaphragm is noted. Mild degenerative change of the mid to lower thoracic spine is seen.  Impression: No focal alveolar infiltrate is identified.    Radiologist location ID: [id number]        Assessment/ Plan:   Active Hospital Problems   (*Primary Problem)    Diagnosis   . *DKA (diabetic ketoacidosis) (CMS HCC)   . Hyperkalemia   . Hyponatremia   . AKI (acute kidney injury) (CMS HCC)   . Acute metabolic encephalopathy   . DKA, type 2 (CMS HCC)     -Type 2 diabetes mellitus with DKA  Currently DKA resolved  Continue with Lantus and insulin sliding scale with fingerstick blood sugar a.c. HS  Hemoglobin A1c 9.6 on 12/23/21  The patient had mentioned that she had an episode of hypoglycemia, currently I do not see any hypoglycemic episodes.   She is doing much better now.  Will continue the current dose of insulin.  No need for any further adjustments.      -Acute kidney injury  This resolved will continue to monitor BMP    -Acute metabolic encephalopathy likely due to DKA with underlying dementia  Mental status appears to be at baseline currently.  She does not seem to be on any home medications for dementia    -Hypertension, controlled  She will be on Norvasc, to adjust as blood pressure responds    -Hypothyroidism  Continue Synthroid    -T12 and L1 compression fracture  T12 status post kyphoplasty  L1 compression fracture plan was for kyphoplasty as outpatient by IR.  Pain control.  She reports pain is controlled currently.    DVT prophylaxis, heparin  Code status, full code    Plan of care discussed with the  daughters at bedside verbalized understanding and agreed.  They report that they want her to go to a different facility not where she came from.    Disposition Planning:   awaiting placement.  Will follow up with case management.    [name], MD  01/06/2022  [facility] HOSPITALIST

## 2022-01-07 LAB — POC BLOOD GLUCOSE (RESULTS)
GLUCOSE, POC: 142 mg/dl (ref 50–500)
GLUCOSE, POC: 247 mg/dl (ref 50–500)
GLUCOSE, POC: 46 mg/dl — CL (ref 50–500)
GLUCOSE, POC: 51 mg/dl (ref 50–500)
GLUCOSE, POC: 123 mg/dl (ref 50–500)
GLUCOSE, POC: 295 mg/dl (ref 50–500)
GLUCOSE, POC: 184 mg/dl (ref 50–500)
GLUCOSE, POC: 482 mg/dl (ref 50–500)

## 2022-01-07 LAB — COVID-19 ~~LOC~~ MOLECULAR LAB TESTING
INFLUENZA VIRUS TYPE A: NOT DETECTED
INFLUENZA VIRUS TYPE B: NOT DETECTED
RESPIRATORY SYNCTIAL VIRUS (RSV): NOT DETECTED
SARS-CoV-2: NOT DETECTED

## 2022-01-07 MED ORDER — INSULIN GLARGINE 100 UNITS/ML SUBQ - CHARGE BY DOSE
3.0000 [IU] | Freq: Two times a day (BID) | SUBCUTANEOUS | Status: DC
Start: 2022-01-07 — End: 2022-01-08
  Administered 2022-01-07 – 2022-01-08 (×2): 3 [IU] via SUBCUTANEOUS
  Filled 2022-01-07 (×2): qty 3

## 2022-01-07 NOTE — PT Treatment (Signed)
[facility]  [place], [address]  [phone]  [phone]  Rehabilitation Department  Physical Therapy Daily Inpatient Note    Date: 01/07/2022  Patient's Name: Jordan Buckley  Date of Birth: [birth year]  Height: Height: 157.5 cm ('5\' 2"'$ )  Weight: Weight: 55 kg (121 lb 3.2 oz)      Plan: Will continue under current POC.         Subjective/Objective/Assessment:     01/07/22 1445   Rehab Session   Document Type therapy progress note (daily note)   Total PT Minutes: 23   Patient Effort adequate   Symptoms Noted During/After Treatment dizziness   Symptoms Noted Comment pt thought blood sugar might be low   General Information   Patient Profile Reviewed yes   Medical Lines Telemetry   Respiratory Status nasal cannula   Existing Precautions/Restrictions fall precautions;full code   Pre Treatment Status   Pre Treatment Patient Status Nurse approved session;Patient supine in bed   Cognitive Assessment/Interventions   Behavior/Mood Observations behavior appropriate to situation, WNL/WFL   Orientation Status oriented x 3   Attention WNL/WFL   Follows Commands WNL   Vital Signs   Pre-Treatment Heart Rate (beats/min) 96   Post-treatment Heart Rate (beats/min) 96   Pre SpO2 (%) 94   O2 Delivery Pre Treatment supplemental O2   Post SpO2 (%) 97   Pain Assessment   Pre/Posttreatment Pain Comment no c/o   Bed Mobility Assessment/Treatment   Bed Mobility, Assistive Device bed rails  (therapist hand)   Supine-Sit [place] minimum assist (75% patient effort)   Sit to Supine, [place] minimum assist (75% patient effort)   Transfer Assessment/Treatment   Sit-Stand [place] minimum assist (75% patient effort)   Stand-Sit [place] minimum assist (75% patient effort)   Sit-Stand-Sit, Assist Device walker, standard   Toilet Transfer [place] minimum assist (75% patient effort)   Toilet Transfer Assist Device walker, standard   Therapeutic Exercise/Activity    Comment Seated LE therex B'ly into all planes of motion   Post Treatment Status   Post Treatment Patient Status Patient supine in bed;Call light within reach;Telephone within reach   [place] Nurse   Communication Post Treatment Comment notified nurse about pt concern about blood sugar being low.   Physical Therapy Clinical Impression   Assessment Pt sits to EOb with MinA. She is guided through seated LE therex into all planes of motion. She then stands to SW and takes few steps around to [facility] and returns when finished. pt stated she felt her blood sugar was low that she felt sort of dizzy/lightheaded. Nursing notified. Pt tolerated tx fairly well and would benefit from continued PT intervention to maximize functional I.                 Intervention minutes: THERAPEUTIC EXERCISE 8 minutes and THERAPEUTIC ACTIVITY 15 minutes    THERAPIST  Elwin Tsou, PTA  01/07/2022, 16:03

## 2022-01-07 NOTE — Care Plan (Signed)
Problem: Health Knowledge, Opportunity to Enhance (Adult,Obstetrics,Pediatric)  Goal: Knowledgeable about Health Subject/Topic  Description: Patient will demonstrate the desired outcomes by discharge/transition of care.  Outcome: Ongoing (see interventions/notes)  Intervention: Enhance Health Knowledge  Recent Flowsheet Documentation  Taken 01/06/2022 2009 by Cleora Fleet, RN  Family/Support System Care: caregiver stress acknowledged  Supportive Measures: active listening utilized     Problem: Adult Inpatient Plan of Care  Goal: Plan of Care Review  Outcome: Ongoing (see interventions/notes)  Goal: Patient-Specific Goal (Individualized)  Outcome: Ongoing (see interventions/notes)  Flowsheets  Taken 01/07/2022 0243  Patient would like to participate in bedside shift report: Yes  Individualized Care Needs: assistance w/ ADLs  Anxieties, Fears or Concerns: BG management  Patient-Specific Goals (Include Timeframe): d/c soon  Plan of Care Reviewed With: patient  Taken 01/06/2022 2009  Patient would like to participate in bedside shift report: Yes  Individualized Care Needs: assistance w/ ADLs  Anxieties, Fears or Concerns: BG management  Patient-Specific Goals (Include Timeframe): d/c soon  Plan of Care Reviewed With: patient  Goal: Absence of Hospital-Acquired Illness or Injury  Outcome: Ongoing (see interventions/notes)  Intervention: Identify and Manage Fall Risk  Recent Flowsheet Documentation  Taken 01/06/2022 2009 by Cleora Fleet, RN  Safety Promotion/Fall Prevention: activity supervised  Intervention: Prevent Skin Injury  Recent Flowsheet Documentation  Taken 01/07/2022 0139 by Cleora Fleet, RN  Body Position:   semi-fowlers (30-45 degrees)   positioned/repositioned independently  Taken 01/06/2022 2339 by Cleora Fleet, RN  Body Position:   semi-fowlers (30-45 degrees)   positioned/repositioned independently  Taken 01/06/2022 2200 by Cleora Fleet, RN  Body Position:   semi-fowlers (30-45 degrees)    positioned/repositioned independently  Taken 01/06/2022 2009 by Cleora Fleet, RN  Skin Protection: adhesive use limited  Intervention: Prevent and Manage VTE (Venous Thromboembolism) Risk  Recent Flowsheet Documentation  Taken 01/06/2022 2009 by Cleora Fleet, RN  VTE Prevention/Management: anticoagulant therapy maintained  Goal: Optimal Comfort and Wellbeing  Outcome: Ongoing (see interventions/notes)  Intervention: Provide Person-Centered Care  Recent Flowsheet Documentation  Taken 01/06/2022 2009 by Cleora Fleet, [place] Relationship/Rapport:   care explained   choices provided   emotional support provided   questions encouraged   empathic listening provided   questions answered   reassurance provided   thoughts/feelings acknowledged  Goal: Rounds/Family Conference  Outcome: Ongoing (see interventions/notes)     Problem: Fall Injury Risk  Goal: Absence of Fall and Fall-Related Injury  Outcome: Ongoing (see interventions/notes)  Intervention: Promote Injury-Free Environment  Recent Flowsheet Documentation  Taken 01/06/2022 2009 by Cleora Fleet, RN  Safety Promotion/Fall Prevention: activity supervised     Problem: Diabetic Ketoacidosis  Goal: Optimal Coping  Outcome: Ongoing (see interventions/notes)  Goal: Fluid and Electrolyte Balance with Absence of Ketosis  Outcome: Ongoing (see interventions/notes)     Problem: Skin Injury Risk Increased  Goal: Skin Health and Integrity  Outcome: Ongoing (see interventions/notes)  Intervention: Optimize Skin Protection  Recent Flowsheet Documentation  Taken 01/06/2022 2009 by Cleora Fleet, RN  Pressure Reduction Techniques: (L,M,H,VH) Turn Schedule - Minimum every 2 hours  Pressure Reduction Devices: (L.M,H,VH) Pressure redistributing mattress utilized  Skin Protection: adhesive use limited

## 2022-01-07 NOTE — Care Management Notes (Signed)
Talked with Nissa from [facility].  States they can take pt in am of 01/08/22.  States pt will need DC Summary, Med list, and results of covid test faxed to them in the am.  Covid test ordered in epic for 01/08/22 already put in by nursing staff for 0700.

## 2022-01-07 NOTE — Nurses Notes (Signed)
Unable to print label, specimen obtained and sent to lab

## 2022-01-07 NOTE — Care Plan (Signed)
Problem: Health Knowledge, Opportunity to Enhance (Adult,Obstetrics,Pediatric)  Goal: Knowledgeable about Health Subject/Topic  Description: Patient will demonstrate the desired outcomes by discharge/transition of care.  Outcome: Ongoing (see interventions/notes)     Problem: Adult Inpatient Plan of Care  Goal: Plan of Care Review  Outcome: Ongoing (see interventions/notes)  Goal: Patient-Specific Goal (Individualized)  Outcome: Ongoing (see interventions/notes)  Goal: Absence of Hospital-Acquired Illness or Injury  Outcome: Ongoing (see interventions/notes)  Goal: Optimal Comfort and Wellbeing  Outcome: Ongoing (see interventions/notes)  Goal: Rounds/Family Conference  Outcome: Ongoing (see interventions/notes)     Problem: Fall Injury Risk  Goal: Absence of Fall and Fall-Related Injury  Outcome: Ongoing (see interventions/notes)     Problem: Diabetic Ketoacidosis  Goal: Optimal Coping  Outcome: Ongoing (see interventions/notes)  Goal: Fluid and Electrolyte Balance with Absence of Ketosis  Outcome: Ongoing (see interventions/notes)     Problem: Skin Injury Risk Increased  Goal: Skin Health and Integrity  Outcome: Ongoing (see interventions/notes)

## 2022-01-08 DIAGNOSIS — F0390 Unspecified dementia without behavioral disturbance: Secondary | ICD-10-CM

## 2022-01-08 DIAGNOSIS — Z7984 Long term (current) use of oral hypoglycemic drugs: Secondary | ICD-10-CM

## 2022-01-08 DIAGNOSIS — E1165 Type 2 diabetes mellitus with hyperglycemia: Secondary | ICD-10-CM

## 2022-01-08 LAB — POC BLOOD GLUCOSE (RESULTS)
GLUCOSE, POC: 204 mg/dl (ref 50–500)
GLUCOSE, POC: 294 mg/dl (ref 50–500)
GLUCOSE, POC: 326 mg/dl (ref 50–500)
GLUCOSE, POC: 377 mg/dl (ref 50–500)
GLUCOSE, POC: 539 mg/dl (ref 50–500)

## 2022-01-08 MED ORDER — AMLODIPINE 10 MG TABLET
10.0000 mg | ORAL_TABLET | Freq: Every day | ORAL | Status: DC
Start: 2022-01-09 — End: 2022-01-20

## 2022-01-08 MED ORDER — INSULIN REGULAR HUMAN 100 UNIT/ML INJECTION SSIP
0.0000 [IU] | INJECTION | Freq: Three times a day (TID) | SUBCUTANEOUS | Status: DC
Start: 2022-01-08 — End: 2022-08-11

## 2022-01-08 MED ORDER — INSULIN GLARGINE 100 UNITS/ML SUBQ - CHARGE BY DOSE
4.0000 [IU] | Freq: Two times a day (BID) | SUBCUTANEOUS | Status: DC
Start: 2022-01-08 — End: 2022-01-20

## 2022-01-08 MED ORDER — INSULIN GLARGINE 100 UNITS/ML SUBQ - CHARGE BY DOSE
4.0000 [IU] | Freq: Two times a day (BID) | SUBCUTANEOUS | Status: DC
Start: 2022-01-08 — End: 2022-01-08

## 2022-01-08 NOTE — Care Plan (Signed)
Problem: Health Knowledge, Opportunity to Enhance (Adult,Obstetrics,Pediatric)  Goal: Knowledgeable about Health Subject/Topic  Description: Patient will demonstrate the desired outcomes by discharge/transition of care.  Outcome: Ongoing (see interventions/notes)     Problem: Adult Inpatient Plan of Care  Goal: Plan of Care Review  Outcome: Ongoing (see interventions/notes)  Goal: Patient-Specific Goal (Individualized)  Outcome: Ongoing (see interventions/notes)  Goal: Absence of Hospital-Acquired Illness or Injury  Outcome: Ongoing (see interventions/notes)  Intervention: Prevent Skin Injury  Recent Flowsheet Documentation  Taken 01/08/2022 1000 by Shaaron Adler, RN  Body Position: turned q 2 hours  Goal: Optimal Comfort and Wellbeing  Outcome: Ongoing (see interventions/notes)  Goal: Rounds/Family Conference  Outcome: Ongoing (see interventions/notes)     Problem: Fall Injury Risk  Goal: Absence of Fall and Fall-Related Injury  Outcome: Ongoing (see interventions/notes)     Problem: Diabetic Ketoacidosis  Goal: Optimal Coping  Outcome: Ongoing (see interventions/notes)  Goal: Fluid and Electrolyte Balance with Absence of Ketosis  Outcome: Ongoing (see interventions/notes)     Problem: Skin Injury Risk Increased  Goal: Skin Health and Integrity  Outcome: Ongoing (see interventions/notes)

## 2022-01-08 NOTE — Nurses Notes (Signed)
Patient discharged to [facility]. IV removed and intact, Tele removed.  AVS reviewed with patient and Colan Neptune LPN at [facility].  A written copy of the AVS and discharge instructions was given to the patient/EMS transport.  Questions sufficiently answered as needed.  Patient encouraged to follow up with PCP as indicated.  In the event of an emergency, patient instructed to call 911 or go to the nearest emergency room. Patient verbalized understanding, all personal belongings sent with patient

## 2022-01-08 NOTE — Consults (Signed)
[facility]  Palliative Care Nurse Practitioner Consult  Consult Note    Jordan Buckley, Jordan Buckley, 81 y.o. female  Date of Birth:  [birth year]  MRN: [id number]  Admit Date: 12/23/2021   Attending: Hospitalist  Lerry Paterson, PA-C   Reason for Consult:  family goal coordination    Chief Complaint:  Confusion    HPI:  Jordan Buckley is a 81 y.o. female who presented to the ED from the nursing home with right facial droop, right arm weakness, unintelligible speech.  Glucose 886, anon gap 37, ABG: ph 7.04, pco2 25, po2 74, hco3 8.8, K+ 6.2, Na 129, Head CT negative.  The patient was admitted with a diagnosis of DKA, hyperkalemia, hyponatremia, AKI, acute metabolic encephalopathy.  She was placed in CCU and started in insulin drip, IVF.  The patient was able to be weaned off insulin drip, mentation improved and was transferred out of the unit on 12/27/21.  The patient had an episode of hypoglycemia and her Lantus dose was adjusted to 4units q12hrs instead of 8 units nightly and SSI 3 times a day.    Subjective:  The patient was lying in the bed upon arrival to the room.  She was alert and oriented to her name and place but is a poor historian and has some confusion.  She denied any complaints.  Nursing reports the patient is going to be discharged to the nursing home today.    Review of Systems:  ROS: Other than ROS in the HPI, all other systems were negative.    Past Medical History:   Diagnosis Date   . Alzheimer's dementia (CMS [place])    . Arthropathy    . Cancer (CMS [place])    . Coronary artery disease    . CVA (cerebrovascular accident) (CMS [place])    . Diabetes mellitus, type 2 (CMS [facility])    . Hard of hearing    . HTN (hypertension)    . Osteoporosis    . Thyroid disease    . Wears glasses          Past Surgical History:   Procedure Laterality Date   . FIXATION KYPHOPLASTY Right 11/27/2021   . HX BACK SURGERY     . HX CATARACT REMOVAL     . HX CESAREAN SECTION     . HX CHOLECYSTECTOMY     . HX CORONARY ARTERY  BYPASS GRAFT     . HX HYSTERECTOMY     . KIDNEY SURGERY Left     cancerous tumor removed   . LIVER RESECTION     . WRIST SURGERY Left           Family Medical History:     Problem Relation (Age of Onset)    No Known Problems Mother, Father          Social Determinants of Health     Financial Resource Strain: None   Transportation Needs: None   Social Connections: Frequent contact with family   Intimate Partner Violence: None   Housing Stability: Currently at SNF            Social History     Socioeconomic History   . Marital status: Widowed   Tobacco Use   . Smoking status: Never   . Smokeless tobacco: Never   Vaping Use   . Vaping Use: Never used   Substance and Sexual Activity   . Alcohol use: Never   . Drug use: Never   . Sexual  activity: Not Currently       Current Outpatient Medications   Medication Instructions   . apixaban (ELIQUIS) 5 mg, Oral, 2 TIMES DAILY   . atorvastatin (LIPITOR) 20 mg, Oral, EVERY EVENING   . docusate sodium (COLACE) 100 mg, Oral, DAILY   . ergocalciferol, vitamin D2, (DRISDOL) 1,250 mcg (50,000 unit) Oral Capsule Vitamin D2 1,250 mcg (50,000 unit) capsule   Take by oral route.   . ferrous sulfate (FEOSOL) 325 mg, Oral, 2 TIMES DAILY WITH FOOD   . HYDROcodone-acetaminophen (NORCO) 5-325 mg Oral Tablet 1 Tablet, Oral, EVERY 4 HOURS PRN   . insulin R human (HUMULIN R) 0-12 Units, Subcutaneous, 4 TIMES DAILY PRN, CAUTION:  "High Alert" Medication.   Marland Kitchen levothyroxine (SYNTHROID) 75 mcg, Oral, EVERY MORNING   . magnesium oxide (MAG-OX) 400 mg, Oral, 2 TIMES DAILY   . MetFORMIN (GLUCOPHAGE) 1,000 mg, Oral, 2 TIMES DAILY WITH FOOD   . ondansetron (ZOFRAN ODT) 4 mg, Oral, EVERY 8 HOURS PRN   . pramipexole (MIRAPEX) 0.125 mg, Oral, 3 TIMES DAILY      Allergies   Allergen Reactions   . Cefaclor Rash   . Nitrofurantoin Rash   . Quinine Rash   . Sulfa (Sulfonamides) Rash   . Ciprofloxacin    . Dulaglutide    . Fluconazole    . Nitrofurantoin Macrocrystal    . Quinidine-Quinine Analogues ([place]  Alkaloids)         Physical Exam:  Constitutional: no distress  Respiratory: Clear to auscultation bilaterally.   Cardiovascular: regular rate and rhythm  Gastrointestinal: Soft, non-tender, Bowel sounds normal, non-distended  Musculoskeletal: No edema  Integumentary:  Skin warm and dry  Neurologic: Alert, oriented to person and place, poor historian    BP 120/60   Pulse 92   Temp 36.3 C (97.4 F)   Resp 20   Ht 1.575 m ('5\' 2"'$ )   Wt 55 kg (121 lb 3.2 oz)   SpO2 96%   BMI 22.17 kg/m        Pain: Numeric 0     Labs:  Lab Results Today:    Results for orders placed or performed during the hospital encounter of 12/23/21 (from the past 24 hour(s))   POC BLOOD GLUCOSE (RESULTS)   Result Value Ref Range    GLUCOSE, POC 295 50 - 500 mg/dl   POC BLOOD GLUCOSE (RESULTS)   Result Value Ref Range    GLUCOSE, POC 184 50 - 500 mg/dl   COVID-19 SCREENING - Placement - NON-PUI   Result Value Ref Range    SARS-CoV-2 Not Detected Not Detected    INFLUENZA VIRUS TYPE A Not Detected Not Detected    INFLUENZA VIRUS TYPE B Not Detected Not Detected    RESPIRATORY SYNCTIAL VIRUS (RSV) Not Detected Not Detected   POC BLOOD GLUCOSE (RESULTS)   Result Value Ref Range    GLUCOSE, POC 482 50 - 500 mg/dl   POC BLOOD GLUCOSE (RESULTS)   Result Value Ref Range    GLUCOSE, POC 539 (HH) 50 - 500 mg/dl   POC BLOOD GLUCOSE (RESULTS)   Result Value Ref Range    GLUCOSE, POC 377 50 - 500 mg/dl   POC BLOOD GLUCOSE (RESULTS)   Result Value Ref Range    GLUCOSE, POC 294 50 - 500 mg/dl       Imaging Studies:    Images and Reports reviewed to current date.      Patient Active Problem List   Diagnosis   .  Osteoarthrosis   . Osteoporosis   . Chest pain   . Back arthralgia   . Weakness   . Dementia (CMS [place])   . Anticoagulant long-term use   . Closed compression fracture of L1 vertebra (CMS [facility])   . Diabetes mellitus, type 2 (CMS HCC)   . DKA (diabetic ketoacidosis) (CMS HCC)   . Hyperkalemia   . Hyponatremia   . AKI (acute kidney injury) (CMS [facility])    . Acute metabolic encephalopathy   . DKA, type 2 (CMS [place])        ACP:  The patient has a MPOA appointing Ludwig Lean and Addison Lank as her Garment/textile technologist.    GOC:  GOC/ACP discussed with MPOA Ludwig Lean due to patient having confusion.  The patient had been at [facility] for 3 days prior to admission but will be going to [facility] when discharged.  The daughter reports that the patient is only able to stand and take a couple of steps with help but is unable to walk.  She states that her mom had a kyphoplasty in April for a compression fracture and has another compression fracture and is going to have another kyphoplasty but is unsure when.  She is total care for all ADL's.  She has occasional incontinent episodes of bladder and bowels.  The daughter states that her mom's baseline mentation is that she knows who everyone is and she can hold a conversation.  She states that her mom is more confused today than her baseline.  The disease trajectory of Dementia was discussed.  Reviewed current condition and plan of care.  Palliative care discussed and the daughter would like her mom to receive palliative services when discharged to the nursing home.    Code Status:  Code status was discussed with daughter/MPOA Ludwig Lean via telephone.  She verbalized that she signed a DNR at [facility] today.  The hospital does not have a copy on file.  Ms. Shary Decamp confirmed she wants her mom to be a DNR.  An Internal no code form was completed as the daughter was not here to sign a DNR card.      PPS:  30%  Fast: 6E      Persons present and participating in discussion: Daughter/MPOA Ludwig Lean via telephone    Was the conversation voluntary? Yes    Plan: Community palliative services after discharge    Patient Disposition/Discharge needs: [facility]    ACP/GOC time:     19 minutes    Total visit time:     44 minutes including ACP/GOC time, Face to face and reviewing medical records.    Thank you for this consult.    Kreg Shropshire,  APRN,NP-C  Palliative Care Nurse Practitioner    This note may have been partially generated using Mmodal Fluency Direct system and there may be some incorrect words, spellings, and punctuation that were not noted in checking the note before saving.

## 2022-01-08 NOTE — Care Plan (Signed)
Problem: Health Knowledge, Opportunity to Enhance (Adult,Obstetrics,Pediatric)  Goal: Knowledgeable about Health Subject/Topic  Description: Patient will demonstrate the desired outcomes by discharge/transition of care.  Outcome: Ongoing (see interventions/notes)  Intervention: Enhance Health Knowledge  Recent Flowsheet Documentation  Taken 01/07/2022 2100 by Cleora Fleet, RN  Family/Support System Care: caregiver stress acknowledged  Supportive Measures: active listening utilized     Problem: Adult Inpatient Plan of Care  Goal: Plan of Care Review  Outcome: Ongoing (see interventions/notes)  Goal: Patient-Specific Goal (Individualized)  Outcome: Ongoing (see interventions/notes)  Flowsheets  Taken 01/08/2022 0129  Patient would like to participate in bedside shift report: Yes  Individualized Care Needs: assistance w/ ADLs  Anxieties, Fears or Concerns: BG management  Patient-Specific Goals (Include Timeframe): d/c soon  Plan of Care Reviewed With: patient  Taken 01/07/2022 2100  Patient would like to participate in bedside shift report: Yes  Individualized Care Needs: assistance w/ ADLs  Anxieties, Fears or Concerns: BG management  Patient-Specific Goals (Include Timeframe): d/c soon  Plan of Care Reviewed With: patient  Goal: Absence of Hospital-Acquired Illness or Injury  Outcome: Ongoing (see interventions/notes)  Intervention: Identify and Manage Fall Risk  Recent Flowsheet Documentation  Taken 01/07/2022 2100 by Cleora Fleet, RN  Safety Promotion/Fall Prevention: activity supervised  Intervention: Prevent Skin Injury  Recent Flowsheet Documentation  Taken 01/07/2022 2351 by Cleora Fleet, RN  Body Position:   positioned with 1 assist   side lying, left  Taken 01/07/2022 2100 by Cleora Fleet, RN  Skin Protection: adhesive use limited  Intervention: Prevent and Manage VTE (Venous Thromboembolism) Risk  Recent Flowsheet Documentation  Taken 01/07/2022 2100 by Cleora Fleet, RN  VTE Prevention/Management:  anticoagulant therapy maintained  Intervention: Prevent Infection  Recent Flowsheet Documentation  Taken 01/07/2022 2100 by Cleora Fleet, RN  Infection Prevention: rest/sleep promoted  Goal: Optimal Comfort and Wellbeing  Outcome: Ongoing (see interventions/notes)  Intervention: Provide Person-Centered Care  Recent Flowsheet Documentation  Taken 01/07/2022 2100 by Cleora Fleet, [place] Relationship/Rapport:   care explained   choices provided   emotional support provided   questions encouraged   empathic listening provided   questions answered   reassurance provided   thoughts/feelings acknowledged  Goal: Rounds/Family Conference  Outcome: Ongoing (see interventions/notes)     Problem: Fall Injury Risk  Goal: Absence of Fall and Fall-Related Injury  Outcome: Ongoing (see interventions/notes)  Intervention: Promote Injury-Free Environment  Recent Flowsheet Documentation  Taken 01/07/2022 2100 by Cleora Fleet, RN  Safety Promotion/Fall Prevention: activity supervised     Problem: Diabetic Ketoacidosis  Goal: Optimal Coping  Outcome: Ongoing (see interventions/notes)  Goal: Fluid and Electrolyte Balance with Absence of Ketosis  Outcome: Ongoing (see interventions/notes)  Intervention: Monitor and Manage Ketoacidosis  Recent Flowsheet Documentation  Taken 01/07/2022 2100 by Cleora Fleet, RN  Glycemic Management: blood glucose monitored  Fluid/Electrolyte Management: fluids provided     Problem: Skin Injury Risk Increased  Goal: Skin Health and Integrity  Outcome: Ongoing (see interventions/notes)  Intervention: Optimize Skin Protection  Recent Flowsheet Documentation  Taken 01/07/2022 2100 by Cleora Fleet, RN  Pressure Reduction Techniques: (L,M,H,VH) Turn Schedule - Minimum every 2 hours  Pressure Reduction Devices: (L.M,H,VH) Pressure redistributing mattress utilized  Skin Protection: adhesive use limited  Activity Management: activity adjusted per tolerance

## 2022-01-08 NOTE — Discharge Summary (Signed)
[facility]  DISCHARGE SUMMARY    PATIENT NAME:  Jordan Buckley, Jordan Buckley  MRN:  [id number]  DOB:  Jul 12, 1941    ENCOUNTER DATE:  12/23/2021  INPATIENT ADMISSION DATE: 12/23/2021  DISCHARGE DATE:  01/08/2022    ATTENDING PHYSICIAN: [name], MD  SERVICE: PRN HOSPITALIST 4  PRIMARY CARE PHYSICIAN: Lerry Paterson, PA-C       No lay caregiver identified.      PRIMARY DISCHARGE DIAGNOSIS: DKA (diabetic ketoacidosis) (CMS [facility])  Active Hospital Problems   No active problems to display.      Resolved Hospital Problems    Diagnosis    . Principal Problem: DKA (diabetic ketoacidosis) (CMS HCC) [E11.10]    . Hyperkalemia [E87.5]    . Hyponatremia [E87.1]    . AKI (acute kidney injury) (CMS [place]) [N17.9]    . Acute metabolic encephalopathy [V78.58]    . DKA, type 2 (CMS HCC) [E11.10]      Active Non-Hospital Problems    Diagnosis Date Noted   . Closed compression fracture of L1 vertebra (CMS HCC) 12/20/2021   . Diabetes mellitus, type 2 (CMS [place]) 12/20/2021   . Dementia (CMS [place]) 12/17/2021   . Anticoagulant long-term use 12/17/2021   . Chest pain 12/16/2021   . Back arthralgia 12/16/2021   . Weakness 12/16/2021   . Osteoarthrosis 04/27/2002   . Osteoporosis 04/27/2002           Current Discharge Medication List      START taking these medications.      Details   amLODIPine 10 mg Tablet  Commonly known as: NORVASC  Start taking on: January 09, 2022   10 mg, Oral, DAILY  Refills: 0     insulin glargine 100 unit/mL injection   4 Units, Subcutaneous, EVERY 12 HOURS  Refills: 0        CONTINUE these medications which have CHANGED during your visit.      Details   insulin R human 100 units/mL Injectable  Commonly known as: HUMULIN R  What changed:    when to take this   reasons to take this   additional instructions   0-12 Units, Subcutaneous, 3 TIMES DAILY BEFORE MEALS  Refills: 0        CONTINUE these medications - NO CHANGES were made during your visit.      Details   apixaban 2.5 mg Tablet  Commonly known as:  ELIQUIS   5 mg, Oral, 2 TIMES DAILY  Refills: 0     atorvastatin 20 mg Tablet  Commonly known as: LIPITOR   20 mg, Oral, EVERY EVENING  Refills: 0     docusate sodium 100 mg Capsule  Commonly known as: COLACE   100 mg, Oral, DAILY  Qty: 1 Capsule  Refills: 0     ergocalciferol (vitamin D2) 1,250 mcg (50,000 unit) Capsule  Commonly known as: DRISDOL   Vitamin D2 1,250 mcg (50,000 unit) capsule   Take by oral route.  Refills: 0     ferrous sulfate 325 mg (65 mg iron) Tablet  Commonly known as: FEOSOL   325 mg, Oral, 2 TIMES DAILY WITH FOOD  Refills: 0     levothyroxine 75 mcg Tablet  Commonly known as: SYNTHROID   75 mcg, Oral, EVERY MORNING  Refills: 0     magnesium oxide 400 mg Tablet  Commonly known as: MAG-OX   400 mg, Oral, 2 TIMES DAILY  Refills: 0     MetFORMIN 1,000 mg Tablet  Commonly known as: GLUCOPHAGE   1,000 mg, Oral, 2 TIMES DAILY WITH FOOD  Refills: 0     ondansetron 4 mg Tablet, Rapid Dissolve  Commonly known as: ZOFRAN ODT   4 mg, Oral, EVERY 8 HOURS PRN  Qty: 12 Tablet  Refills: 0     pramipexole 0.125 mg Tablet  Commonly known as: MIRAPEX   0.125 mg, Oral, 3 TIMES DAILY  Refills: 0        STOP taking these medications.    HYDROcodone-acetaminophen 5-325 mg Tablet  Commonly known as: [facility]          Discharge med list refreshed?  YES     Allergies   Allergen Reactions   . Cefaclor Rash   . Nitrofurantoin Rash   . Quinine Rash   . Sulfa (Sulfonamides) Rash   . Ciprofloxacin    . Dulaglutide    . Fluconazole    . Nitrofurantoin Macrocrystal    . Quinidine-Quinine Analogues ([place] Alkaloids)      HOSPITAL PROCEDURE(S):   No orders of the defined types were placed in this encounter.      REASON FOR HOSPITALIZATION AND HOSPITAL COURSE   BRIEF HPI:  This is a 81 y.o., female admitted for DKA    BRIEF HOSPITAL NARRATIVE:    This is 81 years old 81 years old [place] female known case of dementia, hypertension, hypothyroidism, diabetes mellitus, insulin-dependent came to the hospital with diabetic  ketoacidosis.  She was initially admitted to ICU treated with insulin drip per DKA has been resolved.  Patient has brittle diabetes.  She has intermittent episodes of hyper and hypoglycemia.    Her nighttime coverage were discontinued because of the episode of hypoglycemia.    Lantus was added low dose for units twice daily in the hope that she does not develop episodes of severe hypoglycemia.  She will continue with the insulin sliding scale upon discharge.  Patient dementia appears to be at her baseline.  She has intermittent confusion but overall she is doing much better.  Her electrolyte abnormalities has been resolved.  She was discharged to a skilled nursing facility in stable condition.    Exam:  General:  Patient in NAD, resting in bed, no visitors present  Head:  Normocephalic, atraumatic  Eyes:  PERRL, anicteric sclera  ENT:  Oral mucosa moist, no nasal discharge .  Oxygen nasal cannula in place.  Neck:  Soft, supple, trachea midline  Heart:  RRR, S1 and S2 normal  Lungs:  Unlabored respirations.  Lungs are clear to auscultation bilaterally, with no wheezes, no rales, no conversational dyspnea  Abdomen:  Soft, active bowel sounds, non-tender to palpation, non-distended  Extremities:  Pulses equal bilaterally.  Capillary refill less than 3 seconds.  No edema in lower extremities bilaterally.   Musculoskeletal: Lower back tenderness.  Skin:  Warm and dry, not diaphoretic.  No ecchymosis noted.   Neuro:  A&O x 3.  No focal deficits.  Speech intact  Psych:  Cooperative, not agitated    TRANSITION/POST DISCHARGE CARE/PENDING TESTS/REFERRALS:     CONDITION ON DISCHARGE:  A. Ambulation: Up with assistance only  B. Self-care Ability: With full assistance  C. Cognitive Status Oriented to person and Oriented to place  D. Code status at discharge:       LINES/DRAINS/WOUNDS AT DISCHARGE:   Patient Lines/Drains/Airways Status     Active Line / Dialysis Catheter / Dialysis Graft / Drain / Airway / Wound     Name  Placement  date Placement time Site Days    Peripheral IV Proximal;Right Basilic  (medial side of arm) 01/06/22  1330  -- 1                DISCHARGE DISPOSITION:  Skilled Nursing Unit  DISCHARGE INSTRUCTIONS:  Post-Discharge Follow Up Appointments     Thursday Aug 28, 2022    Return Patient Visit with Pia Mau, APRN,NP-C at 10:30 AM    [facility] [place], [facility], [place] [address]  [phone]        No discharge procedures on file.       [name], MD    Copies sent to Care Team       Relationship Specialty Notifications Start End    [place], [place] PCP - General PHYSICIAN ASSISTANT  10/29/21     Phone: [phone] Fax: [phone]         [address] [place] [address]          Referring providers can utilize [email or web address] to access their referred [place] patient's information.

## 2022-01-08 NOTE — PT Treatment (Signed)
[facility]  [place], [address]  [phone]  [phone]  Rehabilitation Department  Physical Therapy Daily Inpatient Note    Date: 01/08/2022  Patient's Name: Jordan Buckley  Date of Birth: 1941/06/09  Height: Height: 157.5 cm ('5\' 2"'$ )  Weight: Weight: 55 kg (121 lb 3.2 oz)      Plan: Will continue under current POC.         Subjective/Objective/Assessment:  Flowsheet    01/08/22 1032   Rehab Session   Document Type therapy progress note (daily note)   Total PT Minutes: 24   Patient Effort fair   General Information   Patient Profile Reviewed yes   Medical Lines Telemetry   Respiratory Status nasal cannula   Pre Treatment Status   Pre Treatment Patient Status Nurse approved session;Patient supine in bed   Cognitive Assessment/Interventions   Behavior/Mood Observations behavior appropriate to situation, WNL/WFL   Orientation Status oriented x 3   Attention WNL/WFL   Follows Commands WNL   Vital Signs   Pre-Treatment Heart Rate (beats/min) 79   Post-treatment Heart Rate (beats/min) 81   Pre SpO2 (%) 97   O2 Delivery Pre Treatment supplemental O2   O2 Delivery Post Treatment supplemental O2   Bed Mobility Assessment/Treatment   Bed Mobility, Assistive Device bed rails   Supine-Sit Independence minimum assist (75% patient effort)   Sit to Supine, Independence minimum assist (75% patient effort)   Transfer Assessment/Treatment   Sit-Stand [place] minimum assist (75% patient effort)   Stand-Sit [place] minimum assist (75% patient effort)   Sit-Stand-Sit, Assist Device walker, front wheeled   Gait Assessment/Treatment   Total Distance Ambulated 3   Independence  minimum assist (75% patient effort)   Assistive Device  walker, front wheeled   Distance in Feet 3 fup head of bed with RW and min assist   Balance Skill Training   Sitting Balance: Static fair + balance   Sitting, Dynamic (Balance) fair + balance   Therapeutic Exercise/Activity    Comment B LE therx x 10 reps x 2 sets with min/mod assist to include SLR HS SAQ and Ap   Post Treatment Status   Post Treatment Patient Status Patient supine in bed;Call light within reach;Telephone within reach;Sitter select activated   Physical Therapy Clinical Impression   Assessment pt mobility and gait improving slowly pt could benefit from rehab following in acute care stay to max independence and return to PLOF                 Intervention minutes: THERAPEUTIC EXERCISE 12 minutes and THERAPEUTIC ACTIVITY 12 minutes    [place], PTA  01/08/2022, 12:49

## 2022-01-08 NOTE — Care Management Notes (Signed)
Pt has been accepted to [facility] according to [facility]. A DC summary and COVID test needed prior to discharge. CM submitted DC summary and COVID test results to Nissa.

## 2022-01-08 NOTE — Progress Notes (Signed)
[place]    HOSPITALIST PROGRESS NOTE    Jordan Buckley  Date of service: 01/07/2022  Date of Admission:  12/23/2021  Hospital Day:  LOS: 15 days     Subjective:   Doing well , she had episode of hypoglycemia.    Her blood sugars went down to 40s.    Currently she is doing much better.  Her relatives that the bedside.    She denies any complaints.    Physical Exam:  General:  Patient in NAD, resting in bed, no visitors present  Head:  Normocephalic, atraumatic  Eyes:  PERRL, anicteric sclera  ENT:  Oral mucosa moist, no nasal discharge .  Oxygen nasal cannula in place.  Neck:  Soft, supple, trachea midline  Heart:  RRR, S1 and S2 normal  Lungs:  Unlabored respirations.  Lungs are clear to auscultation bilaterally, with no wheezes, no rales, no conversational dyspnea  Abdomen:  Soft, active bowel sounds, non-tender to palpation, non-distended  Extremities:  Pulses equal bilaterally.  Capillary refill less than 3 seconds.  No edema in lower extremities bilaterally.   Musculoskeletal: Lower back tenderness.  Skin:  Warm and dry, not diaphoretic.  No ecchymosis noted.   Neuro:  A&O x 3.  No focal deficits.  Speech intact  Psych:  Cooperative, not agitated    Intake & Output:        Assessment/ Plan:   There are no active hospital problems to display for this patient.    -Type 2 diabetes mellitus with DKA  Currently DKA resolved  Continue with Lantus and insulin sliding scale with fingerstick blood sugar a.c. HS  Hemoglobin A1c 9.6 on 12/23/21  Adding Lantus 3 units twice daily.    Continue sliding scale.  Patient with a brittle diabetes with intermittent episodes of hyper and hypoglycemia..      -Acute kidney injury  This resolved will continue to monitor BMP    -Acute metabolic encephalopathy likely due to DKA with underlying dementia  Mental status appears to be at baseline currently.  She does not seem to be on any home medications for dementia    -Hypertension, controlled  She  will be on Norvasc, to adjust as blood pressure responds    -Hypothyroidism  Continue Synthroid    -T12 and L1 compression fracture  T12 status post kyphoplasty  L1 compression fracture plan was for kyphoplasty as outpatient by IR.  Pain control.  She reports pain is controlled currently.    DVT prophylaxis, heparin  Code status, full code    Plan of care discussed with the daughters at bedside verbalized understanding and agreed.  They report that they want her to go to a different facility not where she came from.    Disposition Planning:   awaiting placement.  Will follow up with case management.    Jordan Rota, MD  01/07/2022  [place] HOSPITALIST

## 2022-01-08 NOTE — Care Plan (Signed)
Nutrition f/u    Pt has been eating well.  Admits "dont overdo sugars".  Discussed sporadic blood sugars.  46-539 range in the last day.  She eats balanced meals provided by staff.  She can tell when sugar goes low.    Continue consistent carb meal plan.   closely watch blood sugars.

## 2022-01-09 NOTE — Care Management Notes (Signed)
Referral Information  ++++++ Placed Provider #1 ++++++  Case Manager: Ronald Hicks  Provider Type: Nursing Home/SNF  Provider Name: [facility]  Address:  [address]  [place], [place] [address]  Contact:  Admissions  Phone: [phone] x  Fax:   Fax: [phone]

## 2022-01-16 ENCOUNTER — Other Ambulatory Visit: Payer: Self-pay

## 2022-01-16 ENCOUNTER — Other Ambulatory Visit (HOSPITAL_COMMUNITY): Payer: Medicare Other | Admitting: FAMILY PRACTICE

## 2022-01-16 ENCOUNTER — Inpatient Hospital Stay
Admission: EM | Admit: 2022-01-16 | Discharge: 2022-01-20 | DRG: 639 | Disposition: A | Discharge status: Skilled Nursing Facility | Payer: Medicare Other | Attending: Internal Medicine | Admitting: Internal Medicine

## 2022-01-16 ENCOUNTER — Emergency Department (HOSPITAL_COMMUNITY): Payer: Medicare Other

## 2022-01-16 DIAGNOSIS — R7309 Other abnormal glucose: Secondary | ICD-10-CM | POA: Insufficient documentation

## 2022-01-16 DIAGNOSIS — Z7984 Long term (current) use of oral hypoglycemic drugs: Secondary | ICD-10-CM

## 2022-01-16 DIAGNOSIS — Z79899 Other long term (current) drug therapy: Secondary | ICD-10-CM

## 2022-01-16 DIAGNOSIS — Z20822 Contact with and (suspected) exposure to covid-19: Secondary | ICD-10-CM | POA: Diagnosis present

## 2022-01-16 DIAGNOSIS — R300 Dysuria: Secondary | ICD-10-CM | POA: Diagnosis present

## 2022-01-16 DIAGNOSIS — E1165 Type 2 diabetes mellitus with hyperglycemia: Principal | ICD-10-CM | POA: Diagnosis present

## 2022-01-16 DIAGNOSIS — R0902 Hypoxemia: Secondary | ICD-10-CM | POA: Diagnosis not present

## 2022-01-16 DIAGNOSIS — Z951 Presence of aortocoronary bypass graft: Secondary | ICD-10-CM

## 2022-01-16 DIAGNOSIS — M129 Arthropathy, unspecified: Secondary | ICD-10-CM | POA: Diagnosis present

## 2022-01-16 DIAGNOSIS — Z794 Long term (current) use of insulin: Secondary | ICD-10-CM

## 2022-01-16 DIAGNOSIS — Z8673 Personal history of transient ischemic attack (TIA), and cerebral infarction without residual deficits: Secondary | ICD-10-CM

## 2022-01-16 DIAGNOSIS — G309 Alzheimer's disease, unspecified: Secondary | ICD-10-CM | POA: Diagnosis present

## 2022-01-16 DIAGNOSIS — Z79890 Hormone replacement therapy (postmenopausal): Secondary | ICD-10-CM

## 2022-01-16 DIAGNOSIS — E039 Hypothyroidism, unspecified: Secondary | ICD-10-CM | POA: Diagnosis present

## 2022-01-16 DIAGNOSIS — F0280 Dementia in other diseases classified elsewhere without behavioral disturbance: Secondary | ICD-10-CM | POA: Diagnosis present

## 2022-01-16 DIAGNOSIS — R739 Hyperglycemia, unspecified: Secondary | ICD-10-CM | POA: Diagnosis present

## 2022-01-16 DIAGNOSIS — Z7901 Long term (current) use of anticoagulants: Secondary | ICD-10-CM

## 2022-01-16 DIAGNOSIS — I2510 Atherosclerotic heart disease of native coronary artery without angina pectoris: Secondary | ICD-10-CM | POA: Diagnosis present

## 2022-01-16 DIAGNOSIS — I10 Essential (primary) hypertension: Secondary | ICD-10-CM | POA: Diagnosis present

## 2022-01-16 DIAGNOSIS — R109 Unspecified abdominal pain: Secondary | ICD-10-CM

## 2022-01-16 DIAGNOSIS — M810 Age-related osteoporosis without current pathological fracture: Secondary | ICD-10-CM | POA: Diagnosis present

## 2022-01-16 LAB — COMPREHENSIVE METABOLIC PANEL, NON-FASTING
ALBUMIN/GLOBULIN RATIO: 1.5 — ABNORMAL HIGH (ref 0.8–1.4)
ALBUMIN: 3.5 g/dL (ref 3.5–5.7)
ALKALINE PHOSPHATASE: 94 U/L (ref 34–104)
ALT (SGPT): 7 U/L (ref 7–52)
ANION GAP: 8 mmol/L — ABNORMAL LOW (ref 10–20)
AST (SGOT): 11 U/L — ABNORMAL LOW (ref 13–39)
BILIRUBIN TOTAL: 0.5 mg/dL (ref 0.3–1.2)
BUN/CREA RATIO: 22 (ref 6–22)
BUN: 21 mg/dL (ref 7–25)
CALCIUM, CORRECTED: 9.4 mg/dL (ref 8.9–10.8)
CALCIUM: 8.9 mg/dL (ref 8.6–10.3)
CHLORIDE: 95 mmol/L — ABNORMAL LOW (ref 98–107)
CO2 TOTAL: 28 mmol/L (ref 21–31)
CREATININE: 0.94 mg/dL (ref 0.60–1.30)
ESTIMATED GFR: 61 mL/min/{1.73_m2} (ref >59)
GLOBULIN: 2.4 — ABNORMAL LOW (ref 2.9–5.4)
GLUCOSE: 552 mg/dL (ref 74–109)
OSMOLALITY, CALCULATED: 291 mOsm/kg — ABNORMAL HIGH (ref 270–290)
POTASSIUM: 5.1 mmol/L (ref 3.5–5.1)
PROTEIN TOTAL: 5.9 g/dL — ABNORMAL LOW (ref 6.4–8.9)
SODIUM: 131 mmol/L — ABNORMAL LOW (ref 136–145)

## 2022-01-16 LAB — BASIC METABOLIC PANEL
ANION GAP: 12 mmol/L (ref 10–20)
BUN/CREA RATIO: 20 (ref 6–22)
BUN: 18 mg/dL (ref 7–25)
CALCIUM: 9 mg/dL (ref 8.6–10.3)
CHLORIDE: 95 mmol/L — ABNORMAL LOW (ref 98–107)
CO2 TOTAL: 23 mmol/L (ref 21–31)
CREATININE: 0.91 mg/dL (ref 0.60–1.30)
ESTIMATED GFR: 63 mL/min/{1.73_m2} (ref >59)
GLUCOSE: 584 mg/dL (ref 74–109)
OSMOLALITY, CALCULATED: 290 mOsm/kg (ref 270–290)
POTASSIUM: 5.4 mmol/L — ABNORMAL HIGH (ref 3.5–5.1)
SODIUM: 130 mmol/L — ABNORMAL LOW (ref 136–145)

## 2022-01-16 LAB — CBC WITH DIFF
BASOPHIL #: 0 10*3/uL (ref 0.00–0.30)
BASOPHIL %: 1 % (ref 0–3)
EOSINOPHIL #: 0.1 10*3/uL (ref 0.00–0.80)
EOSINOPHIL %: 1 % (ref 0–7)
HCT: 32.4 % — ABNORMAL LOW (ref 37.0–47.0)
HGB: 10.9 g/dL — ABNORMAL LOW (ref 12.5–16.0)
LYMPHOCYTE #: 1 10*3/uL — ABNORMAL LOW (ref 1.10–5.00)
LYMPHOCYTE %: 27 % (ref 25–45)
MCH: 31.5 pg (ref 27.0–32.0)
MCHC: 33.6 g/dL (ref 32.0–36.0)
MCV: 93.8 fL (ref 78.0–99.0)
MONOCYTE #: 0.3 10*3/uL (ref 0.00–1.30)
MONOCYTE %: 9 % (ref 0–12)
MPV: 6.7 fL — ABNORMAL LOW (ref 7.4–10.4)
NEUTROPHIL #: 2.2 10*3/uL (ref 1.80–8.40)
NEUTROPHIL %: 61 % (ref 40–76)
PLATELETS: 262 10*3/uL (ref 140–440)
RBC: 3.45 10*6/uL — ABNORMAL LOW (ref 4.20–5.40)
RDW: 14.9 % — ABNORMAL HIGH (ref 11.6–14.8)
WBC: 3.6 10*3/uL — ABNORMAL LOW (ref 4.0–10.5)
WBCS UNCORRECTED: 3.6 10*3/uL

## 2022-01-16 LAB — LIPASE: LIPASE: 102 U/L — ABNORMAL HIGH (ref 11–82)

## 2022-01-16 LAB — VENOUS BLOOD GAS/LACTATE
BASE EXCESS: 3.2 mmol/L — ABNORMAL HIGH (ref <=3.0)
BICARBONATE (VENOUS): 26.4 mmol/L — ABNORMAL HIGH (ref 22.0–26.0)
CARBOXYHEMOGLOBIN: 3.1 % — ABNORMAL HIGH (ref <=1.5)
LACTATE: 0.9 mmol/L (ref <=4.0)
MET-HEMOGLOBIN: 0.6 % (ref <=2.0)
O2CT: 14.7 %
OXYHEMOGLOBIN: 93 % (ref 88.0–100.0)
PCO2 (VENOUS): 54 mm/Hg — ABNORMAL HIGH (ref 41–51)
PH (VENOUS): 7.34 (ref 7.31–7.41)
PO2 (VENOUS): 81 mm/Hg — ABNORMAL HIGH (ref 35–50)

## 2022-01-16 LAB — POC BLOOD GLUCOSE (RESULTS)
GLUCOSE, POC: 148 mg/dl (ref 50–500)
GLUCOSE, POC: 357 mg/dl (ref 50–500)
GLUCOSE, POC: 454 mg/dl (ref 50–500)
GLUCOSE, POC: 465 mg/dl (ref 50–500)

## 2022-01-16 LAB — LACTIC ACID LEVEL W/ REFLEX FOR LEVEL >2.0: LACTIC ACID: 0.8 mmol/L (ref 0.5–2.2)

## 2022-01-16 LAB — URINALYSIS, MICROSCOPIC: RBCS: 1 /hpf (ref <4)

## 2022-01-16 LAB — URINALYSIS, MACROSCOPIC
BILIRUBIN: NEGATIVE mg/dL
BLOOD: NEGATIVE mg/dL
GLUCOSE: >1000 mg/dL — AB
KETONES: 20 mg/dL — AB
LEUKOCYTES: NEGATIVE WBCs/uL
NITRITE: NEGATIVE
PH: 5.5 (ref 5.0–9.0)
PROTEIN: NEGATIVE mg/dL
SPECIFIC GRAVITY: 1.023 (ref 1.002–1.030)
UROBILINOGEN: NORMAL mg/dL

## 2022-01-16 LAB — TROPONIN-I
TROPONIN I: 11 ng/L (ref <15)
TROPONIN I: 12 ng/L (ref <15)

## 2022-01-16 LAB — BETA-HYDROXYBUTYRATE, PLASMA OR SERUM: BETA-HYDROXYBUTYRATE: 2.31 mmol/L — ABNORMAL HIGH (ref 0.02–0.29)

## 2022-01-16 MED ORDER — INSULIN REGULAR HUMAN 100 UNIT/ML INJECTION - CHARGE BY DOSE
10.0000 [IU] | INTRAMUSCULAR | Status: AC
Start: 2022-01-16 — End: 2022-01-16
  Administered 2022-01-16: 10 [IU] via SUBCUTANEOUS

## 2022-01-16 MED ORDER — INSULIN U-100 REGULAR HUMAN 100 UNIT/ML INJECTION SOLUTION
INTRAMUSCULAR | Status: AC
Start: 2022-01-16 — End: 2022-01-16
  Filled 2022-01-16: qty 30

## 2022-01-16 MED ORDER — IOHEXOL 350 MG IODINE/ML INTRAVENOUS SOLUTION
75.0000 mL | INTRAVENOUS | Status: AC
Start: 2022-01-16 — End: 2022-01-16
  Administered 2022-01-16: 75 mL via INTRAVENOUS

## 2022-01-16 MED ORDER — INSULIN REGULAR HUMAN 100 UNIT/ML INJECTION - CHARGE BY DOSE
20.0000 [IU] | INTRAMUSCULAR | Status: AC
Start: 2022-01-16 — End: 2022-01-16
  Administered 2022-01-16: 20 [IU] via SUBCUTANEOUS

## 2022-01-16 MED ORDER — SODIUM CHLORIDE 0.9 % IV BOLUS
500.0000 mL | INJECTION | Status: AC
Start: 2022-01-16 — End: 2022-01-16
  Administered 2022-01-16: 500 mL via INTRAVENOUS
  Administered 2022-01-16: 0 mL via INTRAVENOUS

## 2022-01-16 MED ORDER — INSULIN REGULAR HUMAN 100 UNIT/ML INJECTION - CHARGE BY DOSE
18.0000 [IU] | INTRAMUSCULAR | Status: DC
Start: 2022-01-17 — End: 2022-01-16

## 2022-01-16 MED ORDER — INSULIN U-100 REGULAR HUMAN 100 UNIT/ML INJECTION SOLUTION
INTRAMUSCULAR | Status: AC
Start: 2022-01-16 — End: 2022-01-16
  Filled 2022-01-16: qty 60

## 2022-01-16 NOTE — ED Nurses Note (Signed)
Report given to Andee Poles, RN. Care endorsed.

## 2022-01-16 NOTE — ED Triage Notes (Signed)
[facility] sent for evaluation of N/V and elevated blood sugar greater than 500. Recent admission for DKA    PRS:  BS 497, transport

## 2022-01-16 NOTE — ED Provider Notes (Signed)
Emergency Medicine      Name: Jordan Buckley  Age and Gender: 81 y.o. female  Date of Birth: 1941-03-12  MRN: [id number]  PCP: Lerry Paterson, PA-C    CC:  Chief Complaint   Patient presents with   . Hyperglycemia       HPI:  Jordan Buckley is a 81 y.o. White female who presents to the ER with hyperglycemia. Patient states her blood sugar has been elevated today. She reports associated nausea and abdominal pain. She reports dysuria at times. She denies chest pain or shortness of breath. Patient was admitted to the hospital last month for DKA.    Below pertinent information reviewed with patient:  Past Medical History:   Diagnosis Date   . Alzheimer's dementia (CMS [place])    . Arthropathy    . Cancer (CMS [place])    . Coronary artery disease    . CVA (cerebrovascular accident) (CMS [place])    . Diabetes mellitus, type 2 (CMS [facility])    . Hard of hearing    . HTN (hypertension)    . Osteoporosis    . Thyroid disease    . Wears glasses            Allergies   Allergen Reactions   . Cefaclor Rash   . Nitrofurantoin Rash   . Quinine Rash   . Sulfa (Sulfonamides) Rash   . Ciprofloxacin    . Dulaglutide    . Fluconazole    . Nitrofurantoin Macrocrystal    . Quinidine-Quinine Analogues ([place]ona Alkaloids)        Past Surgical History:   Procedure Laterality Date   . FIXATION KYPHOPLASTY Right 11/27/2021   . HX BACK SURGERY     . HX CATARACT REMOVAL     . HX CESAREAN SECTION     . HX CHOLECYSTECTOMY     . HX CORONARY ARTERY BYPASS GRAFT     . HX HYSTERECTOMY     . KIDNEY SURGERY Left     cancerous tumor removed   . LIVER RESECTION     . WRIST SURGERY Left             Social History     Socioeconomic History   . Marital status: Widowed   Tobacco Use   . Smoking status: Never   . Smokeless tobacco: Never   Vaping Use   . Vaping Use: Never used   Substance and Sexual Activity   . Alcohol use: Never   . Drug use: Never   . Sexual activity: Not Currently       ROS:  No other overt positive review of systems are noted other than  stated in the HPI.      Objective:    ED Triage Vitals [01/16/22 1853]   BP (Non-Invasive) 107/66   Heart Rate 85   Respiratory Rate 16   Temperature 36.9 C (98.5 F)   SpO2 93 %   Weight 56.7 kg (125 lb)   Height 1.575 m (_0 )     Filed Vitals:    01/17/22 0200 01/17/22 0300 01/17/22 0400 01/17/22 0500   BP: 116/72 (!) 108/49 (!) 93/40 (!) 106/45   Pulse: 88 89 85 92   Resp: (!) _1 (!) 21   Temp:       SpO2:           Nursing notes and vital signs reviewed.    Constitutional - No acute distress.  Alert and  Active.  HEENT - Normocephalic. Conjunctiva clear.  Moist mucous membranes.   Neck - Trachea midline. No stridor. No hoarseness.  Cardiac - Regular rate and rhythm. No murmurs, rubs, or gallops.   Respiratory/Chest - Normal respiratory effort. Clear to auscultation bilaterally. No rales, wheezes or rhonchi. No chest tenderness.  Abdomen - Normal bowel sounds. Tenderness in the epigastrium and suprapubic abdomen. Abdomen is soft, non-distended. No rebound or guarding.   Musculoskeletal - Good AROM. No muscle or joint tenderness appreciated. No clubbing, cyanosis or edema.  Skin - Warm and dry, without any rashes or other lesions.  Neuro - Alert and oriented x 3.  Moving all extremities symmetrically.  Psych - Normal mood and affect. Behavior is normal       Any pertinent labs and imaging obtained during this encounter reviewed below in MDM.    MDM/ED Course:      Medical Decision Making  Patient presented to the ER after being sent from [facility] for hyperglycemia.  Patient was initially evaluated and noted to have mild tenderness in the epigastrium and suprapubic abdomen.  Initial blood glucose was greater than 500.  Patient was given a 500 mL normal saline bolus initially and 10 units of regular subcutaneous insulin.  There was minimal affect on her blood glucose with this treatment.  For that reason, she was then given 20 units of regular subcutaneous insulin which resulted in a rapid  drop in her blood glucose into the 40s.  She was given 1 amp of dextrose at that time as well as food, juice, soda.  Her glucose transiently improved but again dropped into the 60s and was given another amp of dextrose.  Blood sugar then suddenly rose to 404 and 1 hour later was 509.  Hospitalist was consulted at 3 different times during this patient's ER visit, but did not want to admit her because he did not feel labile blood glucose warranted hospitalization.  After most recent conversation with the hospitalist, it was recommended to give the patient a dose of long-acting insulin and so this was ordered.  As this will take several hours to bring the patient's blood glucose down enough for her to be discharged, care will be transferred to [facility] PA-C pending improved glucose control.    Amount and/or Complexity of Data Reviewed  Labs: ordered.  Radiology: ordered. Decision-making details documented in ED Course.  ECG/medicine tests: ordered.      Risk  OTC drugs.  Prescription drug management.          Critical care: 60 minutes  Time spent performing initial H and P, ordering and interpreting diagnostics, decision making regarding initiation of IV fluids, insulin, dextrose, reassessing patient numerous times, consulting with hospitalist multiple times, and discussing case with oncoming APC.    ED Course as of 01/17/22 0703   Thu Jan 16, 2022   2148 CT ABDOMEN PELVIS W IV CONTRAST  NO ACUTE FINDINGS AT THE ABDOMEN OR PELVIS ON CONTRAST-ENHANCED CT.        Fri Jan 17, 2022   0221 Dr Deloria Lair, states he would rather not admit the patient just for labile glucose, unless something else is going on. Recommends serial accuchecks to monitor patient until stable for discharge   0437 Dr Deloria Lair, states he would like [place] to check the patient's glucose in an hour   0540 Dr Deloria Lair, states to give the patient long acting insulin. Will not admit       Orders Placed  This Encounter   . CT ABDOMEN PELVIS W IV CONTRAST   .  CBC/DIFF   . COMPREHENSIVE METABOLIC PANEL, NON-FASTING   . VENOUS BLOOD GAS/LACTATE   . URINALYSIS, MACROSCOPIC AND MICROSCOPIC W/CULTURE REFLEX   . BETA-HYDROXYBUTYRATE, PLASMA OR SERUM   . TROPONIN NOW   . TROPONIN IN ONE HOUR   . TROPONIN IN THREE HOURS   . LACTIC ACID LEVEL W/ REFLEX FOR LEVEL >2.0   . CBC WITH DIFF   . URINALYSIS, MACROSCOPIC   . URINALYSIS, MICROSCOPIC   . LIPASE   . ECG 12 LEAD   . PERFORM POC WHOLE BLOOD GLUCOSE   . PERFORM POC WHOLE BLOOD GLUCOSE   . NS bolus infusion 500 mL   . insulin R human 100 units/mL injection   . iohexol (OMNIPAQUE 350) infusion   . insulin R human 100 units/mL injection   . dextrose 50% (0.5 g/mL) injection - syringe   . dextrose 50% (0.5 g/mL) injection - syringe   . insulin glargine 100 units/mL injection           Impression:   Clinical Impression   Labile blood glucose (Primary)       Disposition: Data Unavailable      Portions of this note may have been dictated using voice recognition software.     Elige Ko PA-C    -----------------------  Results for orders placed or performed during the hospital encounter of 01/16/22 (from the past 12 hour(s))   POC BLOOD GLUCOSE (RESULTS)   Result Value Ref Range    GLUCOSE, POC 465 50 - 500 mg/dl   URINALYSIS, MACROSCOPIC   Result Value Ref Range    COLOR Yellow Colorless, Light Yellow, Yellow    APPEARANCE Clear Clear    SPECIFIC GRAVITY 1.023 1.002 - 1.030    PH 5.5 5.0 - 9.0    LEUKOCYTES Negative Negative, 100  WBCs/uL    NITRITE Negative Negative    PROTEIN Negative Negative, 10 , 20  mg/dL    GLUCOSE >1000 (A) Negative, 30  mg/dL    KETONES 20 (A) Negative, Trace mg/dL    BILIRUBIN Negative Negative, 0.5 mg/dL    BLOOD Negative Negative, 0.03 mg/dL    UROBILINOGEN Normal Normal mg/dL   URINALYSIS, MICROSCOPIC   Result Value Ref Range    RBCS 1 <4 /hpf    WBCS     COMPREHENSIVE METABOLIC PANEL, NON-FASTING   Result Value Ref Range    SODIUM 131 (L) 136 - 145 mmol/L    POTASSIUM 5.1 3.5 - 5.1 mmol/L    CHLORIDE  95 (L) 98 - 107 mmol/L    CO2 TOTAL 28 21 - 31 mmol/L    ANION GAP 8 (L) 10 - 20 mmol/L    BUN 21 7 - 25 mg/dL    CREATININE 0.94 0.60 - 1.30 mg/dL    BUN/CREA RATIO 22 6 - 22    ESTIMATED GFR 61 >59 mL/min/1.53m2    ALBUMIN 3.5 3.5 - 5.7 g/dL    CALCIUM 8.9 8.6 - 10.3 mg/dL    GLUCOSE 552 (HH) 74 - 109 mg/dL    ALKALINE PHOSPHATASE 94 34 - 104 U/L    ALT (SGPT) 7 7 - 52 U/L    AST (SGOT) 11 (L) 13 - 39 U/L    BILIRUBIN TOTAL 0.5 0.3 - 1.2 mg/dL    PROTEIN TOTAL 5.9 (L) 6.4 - 8.9 g/dL    ALBUMIN/GLOBULIN RATIO 1.5 (H) 0.8 - 1.4  OSMOLALITY, CALCULATED 291 (H) 270 - 290 mOsm/kg    CALCIUM, CORRECTED 9.4 8.9 - 10.8 mg/dL    GLOBULIN 2.4 (L) 2.9 - 5.4   VENOUS BLOOD GAS/LACTATE   Result Value Ref Range    PH (VENOUS) 7.34 7.31 - 7.41    PCO2 (VENOUS) 54 (H) 41 - 51 mm/Hg    PO2 (VENOUS) 81 (H) 35 - 50 mm/Hg    BICARBONATE (VENOUS) 26.4 (H) 22.0 - 26.0 mmol/L    BASE EXCESS 3.2 (H) -3.0 - 3.0 mmol/L    LACTATE 0.9 <=4.0 mmol/L    OXYHEMOGLOBIN 93.0 88.0 - 100.0 %    CARBOXYHEMOGLOBIN 3.1 (H) <=1.5 %    MET-HEMOGLOBIN 0.6 <=2.0 %    %FIO2 (VENOUS)      O2CT 14.7 %   BETA-HYDROXYBUTYRATE, PLASMA OR SERUM   Result Value Ref Range    BETA-HYDROXYBUTYRATE 2.31 (H) 0.02 - 0.29 mmol/L   TROPONIN NOW   Result Value Ref Range    TROPONIN I 11 <15 ng/L   LACTIC ACID LEVEL W/ REFLEX FOR LEVEL >2.0   Result Value Ref Range    LACTIC ACID 0.8 0.5 - 2.2 mmol/L   CBC WITH DIFF   Result Value Ref Range    WBCS UNCORRECTED 3.6 x10^3/uL    WBC 3.6 (L) 4.0 - 10.5 x10^3/uL    RBC 3.45 (L) 4.20 - 5.40 x10^6/uL    HGB 10.9 (L) 12.5 - 16.0 g/dL    HCT 32.4 (L) 37.0 - 47.0 %    MCV 93.8 78.0 - 99.0 fL    MCH 31.5 27.0 - 32.0 pg    MCHC 33.6 32.0 - 36.0 g/dL    RDW 14.9 (H) 11.6 - 14.8 %    PLATELETS 262 140 - 440 x10^3/uL    MPV 6.7 (L) 7.4 - 10.4 fL    NEUTROPHIL % 61 40 - 76 %    LYMPHOCYTE % 27 25 - 45 %    MONOCYTE % 9 0 - 12 %    EOSINOPHIL % 1 0 - 7 %    BASOPHIL % 1 0 - 3 %    NEUTROPHIL # 2.20 1.80 - 8.40 x10^3/uL    LYMPHOCYTE #  1.00 (L) 1.10 - 5.00 x10^3/uL    MONOCYTE # 0.30 0.00 - 1.30 x10^3/uL    EOSINOPHIL # 0.10 0.00 - 0.80 x10^3/uL    BASOPHIL # 0.00 0.00 - 0.30 x10^3/uL   LIPASE   Result Value Ref Range    LIPASE 102 (H) 11 - 82 U/L   TROPONIN IN ONE HOUR   Result Value Ref Range    TROPONIN I 12 <15 ng/L   POC BLOOD GLUCOSE (RESULTS)   Result Value Ref Range    GLUCOSE, POC 454 50 - 500 mg/dl   POC BLOOD GLUCOSE (RESULTS)   Result Value Ref Range    GLUCOSE, POC 357 50 - 500 mg/dl   POC BLOOD GLUCOSE (RESULTS)   Result Value Ref Range    GLUCOSE, POC 148 50 - 500 mg/dl   TROPONIN IN THREE HOURS   Result Value Ref Range    TROPONIN I 13 <15 ng/L   POC BLOOD GLUCOSE (RESULTS)   Result Value Ref Range    GLUCOSE, POC 65 50 - 500 mg/dl   POC BLOOD GLUCOSE (RESULTS)   Result Value Ref Range    GLUCOSE, POC 163 50 - 500 mg/dl   POC BLOOD GLUCOSE (RESULTS)   Result Value Ref Range  GLUCOSE, POC 79 50 - 500 mg/dl   POC BLOOD GLUCOSE (RESULTS)   Result Value Ref Range    GLUCOSE, POC 81 50 - 500 mg/dl   POC BLOOD GLUCOSE (RESULTS)   Result Value Ref Range    GLUCOSE, POC 75 50 - 500 mg/dl   POC BLOOD GLUCOSE (RESULTS)   Result Value Ref Range    GLUCOSE, POC 404 50 - 500 mg/dl   POC BLOOD GLUCOSE (RESULTS)   Result Value Ref Range    GLUCOSE, POC 415 50 - 500 mg/dl   POC BLOOD GLUCOSE (RESULTS)   Result Value Ref Range    GLUCOSE, POC 509 (HH) 50 - 500 mg/dl     CT ABDOMEN PELVIS W IV CONTRAST   Final Result   NO ACUTE FINDINGS AT THE ABDOMEN OR PELVIS ON CONTRAST-ENHANCED CT.          One or more dose reduction techniques were used (e.g., Automated exposure control, adjustment of the mA and/or kV according to patient size, use of iterative reconstruction technique).         Radiologist location ID: [id number]

## 2022-01-17 ENCOUNTER — Encounter (HOSPITAL_COMMUNITY): Payer: Self-pay

## 2022-01-17 DIAGNOSIS — H9190 Unspecified hearing loss, unspecified ear: Secondary | ICD-10-CM

## 2022-01-17 DIAGNOSIS — R739 Hyperglycemia, unspecified: Secondary | ICD-10-CM | POA: Diagnosis present

## 2022-01-17 DIAGNOSIS — I2510 Atherosclerotic heart disease of native coronary artery without angina pectoris: Secondary | ICD-10-CM

## 2022-01-17 DIAGNOSIS — E079 Disorder of thyroid, unspecified: Secondary | ICD-10-CM

## 2022-01-17 DIAGNOSIS — Z7984 Long term (current) use of oral hypoglycemic drugs: Secondary | ICD-10-CM

## 2022-01-17 DIAGNOSIS — F0280 Dementia in other diseases classified elsewhere without behavioral disturbance: Secondary | ICD-10-CM

## 2022-01-17 DIAGNOSIS — M810 Age-related osteoporosis without current pathological fracture: Secondary | ICD-10-CM

## 2022-01-17 DIAGNOSIS — I10 Essential (primary) hypertension: Secondary | ICD-10-CM

## 2022-01-17 DIAGNOSIS — M129 Arthropathy, unspecified: Secondary | ICD-10-CM

## 2022-01-17 DIAGNOSIS — E039 Hypothyroidism, unspecified: Secondary | ICD-10-CM

## 2022-01-17 LAB — POC BLOOD GLUCOSE (RESULTS)
GLUCOSE, POC: 102 mg/dl (ref 50–500)
GLUCOSE, POC: 136 mg/dl (ref 50–500)
GLUCOSE, POC: 163 mg/dl (ref 50–500)
GLUCOSE, POC: 20 mg/dl — CL (ref 50–500)
GLUCOSE, POC: 224 mg/dl (ref 50–500)
GLUCOSE, POC: 230 mg/dl (ref 50–500)
GLUCOSE, POC: 404 mg/dl (ref 50–500)
GLUCOSE, POC: 414 mg/dl (ref 50–500)
GLUCOSE, POC: 415 mg/dl (ref 50–500)
GLUCOSE, POC: 44 mg/dl — CL (ref 50–500)
GLUCOSE, POC: 473 mg/dl (ref 50–500)
GLUCOSE, POC: 509 mg/dl (ref 50–500)
GLUCOSE, POC: 62 mg/dl (ref 50–500)
GLUCOSE, POC: 65 mg/dl (ref 50–500)
GLUCOSE, POC: 75 mg/dl (ref 50–500)
GLUCOSE, POC: 79 mg/dl (ref 50–500)
GLUCOSE, POC: 81 mg/dl (ref 50–500)

## 2022-01-17 LAB — TROPONIN-I: TROPONIN I: 13 ng/L (ref <15)

## 2022-01-17 MED ORDER — AMLODIPINE 5 MG TABLET
10.0000 mg | ORAL_TABLET | Freq: Every day | ORAL | Status: DC
Start: 2022-01-17 — End: 2022-01-20
  Administered 2022-01-17 – 2022-01-19 (×3): 10 mg via ORAL
  Administered 2022-01-20: 0 mg via ORAL
  Filled 2022-01-17 (×4): qty 2

## 2022-01-17 MED ORDER — INSULIN GLARGINE 100 UNITS/ML SUBQ - CHARGE BY DOSE
4.0000 [IU] | SUBCUTANEOUS | Status: AC
Start: 2022-01-17 — End: 2022-01-17
  Administered 2022-01-17: 4 [IU] via SUBCUTANEOUS

## 2022-01-17 MED ORDER — INSULIN U-100 REGULAR HUMAN 100 UNIT/ML INJECTION SOLUTION
INTRAMUSCULAR | Status: AC
Start: 2022-01-17 — End: 2022-01-17
  Filled 2022-01-17: qty 45

## 2022-01-17 MED ORDER — DEXTROSE 50 % IN WATER (D50W) INTRAVENOUS SYRINGE
12.5000 g | INJECTION | INTRAVENOUS | Status: DC | PRN
Start: 2022-01-17 — End: 2022-01-20

## 2022-01-17 MED ORDER — DEXTROSE 50 % IN WATER (D50W) INTRAVENOUS SYRINGE
25.0000 g | INJECTION | INTRAVENOUS | Status: DC | PRN
Start: 2022-01-17 — End: 2022-01-20

## 2022-01-17 MED ORDER — PRAMIPEXOLE 0.25 MG TABLET
0.1250 mg | ORAL_TABLET | Freq: Three times a day (TID) | ORAL | Status: DC
Start: 2022-01-17 — End: 2022-01-20
  Administered 2022-01-17 – 2022-01-20 (×10): 0.125 mg via ORAL
  Filled 2022-01-17 (×8): qty 1
  Filled 2022-01-17 (×2): qty 0.5
  Filled 2022-01-17: qty 1
  Filled 2022-01-17 (×2): qty 0.5
  Filled 2022-01-17 (×2): qty 1

## 2022-01-17 MED ORDER — ONDANSETRON 4 MG DISINTEGRATING TABLET
4.0000 mg | ORAL_TABLET | Freq: Three times a day (TID) | ORAL | Status: DC | PRN
Start: 2022-01-17 — End: 2022-01-20

## 2022-01-17 MED ORDER — DEXTROSE 50 % IN WATER (D50W) INTRAVENOUS SYRINGE
INJECTION | INTRAVENOUS | Status: AC
Start: 2022-01-17 — End: 2022-01-17
  Filled 2022-01-17: qty 50

## 2022-01-17 MED ORDER — SODIUM CHLORIDE 0.9 % INTRAVENOUS SOLUTION
INTRAVENOUS | Status: AC
Start: 2022-01-17 — End: 2022-01-18

## 2022-01-17 MED ORDER — MAGNESIUM OXIDE 400 MG (241.3 MG MAGNESIUM) TABLET
400.0000 mg | ORAL_TABLET | Freq: Two times a day (BID) | ORAL | Status: DC
Start: 2022-01-17 — End: 2022-01-20
  Administered 2022-01-17 – 2022-01-20 (×6): 400 mg via ORAL
  Filled 2022-01-17 (×6): qty 1

## 2022-01-17 MED ORDER — ACETAMINOPHEN 325 MG TABLET
650.0000 mg | ORAL_TABLET | ORAL | Status: DC | PRN
Start: 2022-01-17 — End: 2022-01-20
  Administered 2022-01-17 – 2022-01-20 (×5): 650 mg via ORAL
  Filled 2022-01-17 (×5): qty 2

## 2022-01-17 MED ORDER — DOCUSATE SODIUM 100 MG CAPSULE
100.0000 mg | ORAL_CAPSULE | Freq: Every day | ORAL | Status: DC
Start: 2022-01-17 — End: 2022-01-20
  Administered 2022-01-17 – 2022-01-20 (×4): 100 mg via ORAL
  Filled 2022-01-17 (×4): qty 1

## 2022-01-17 MED ORDER — FERROUS SULFATE 324 MG (65 MG IRON) TABLET,DELAYED RELEASE
324.0000 mg | DELAYED_RELEASE_TABLET | Freq: Every morning | ORAL | Status: DC
Start: 2022-01-18 — End: 2022-01-20
  Administered 2022-01-18 – 2022-01-19 (×2): 324 mg via ORAL
  Administered 2022-01-19: 0 mg via ORAL
  Administered 2022-01-20: 324 mg via ORAL
  Filled 2022-01-17 (×3): qty 1

## 2022-01-17 MED ORDER — LEVOTHYROXINE 50 MCG TABLET
75.0000 ug | ORAL_TABLET | Freq: Every morning | ORAL | Status: DC
Start: 2022-01-17 — End: 2022-01-20
  Administered 2022-01-17 – 2022-01-20 (×4): 75 ug via ORAL
  Filled 2022-01-17 (×4): qty 2

## 2022-01-17 MED ORDER — INSULIN REGULAR HUMAN 100 UNIT/ML INJECTION SSIP
0.0000 [IU] | INJECTION | Freq: Four times a day (QID) | SUBCUTANEOUS | Status: DC | PRN
Start: 2022-01-17 — End: 2022-01-20
  Administered 2022-01-17: 6 [IU] via SUBCUTANEOUS
  Administered 2022-01-18 – 2022-01-19 (×2): 9 [IU] via SUBCUTANEOUS
  Administered 2022-01-20: 4 [IU] via SUBCUTANEOUS
  Filled 2022-01-17: qty 27
  Filled 2022-01-17: qty 12
  Filled 2022-01-17: qty 18
  Filled 2022-01-17: qty 27

## 2022-01-17 MED ORDER — INSULIN REGULAR HUMAN 100 UNIT/ML INJECTION - CHARGE BY DOSE
15.0000 [IU] | INTRAMUSCULAR | Status: AC
Start: 2022-01-17 — End: 2022-01-17
  Administered 2022-01-17: 15 [IU] via SUBCUTANEOUS

## 2022-01-17 MED ORDER — DEXTROSE 50 % IN WATER (D50W) INTRAVENOUS SYRINGE
25.0000 g | INJECTION | INTRAVENOUS | Status: AC
Start: 2022-01-17 — End: 2022-01-17
  Administered 2022-01-17: 50 mL via INTRAVENOUS

## 2022-01-17 MED ORDER — APIXABAN 5 MG TABLET
5.0000 mg | ORAL_TABLET | Freq: Two times a day (BID) | ORAL | Status: DC
Start: 2022-01-17 — End: 2022-01-20
  Administered 2022-01-17 – 2022-01-20 (×6): 5 mg via ORAL
  Filled 2022-01-17 (×6): qty 1

## 2022-01-17 MED ORDER — INSULIN GLARGINE 100 UNITS/ML SUBQ - CHARGE BY DOSE
SUBCUTANEOUS | Status: AC
Start: 2022-01-17 — End: 2022-01-17
  Filled 2022-01-17: qty 4

## 2022-01-17 MED ORDER — GLUCAGON 1 MG/ML SOLUTION FOR INJECTION
1.0000 mg | INTRAMUSCULAR | Status: DC | PRN
Start: 2022-01-17 — End: 2022-01-20

## 2022-01-17 MED ORDER — INSULIN GLARGINE 100 UNITS/ML SUBQ - CHARGE BY DOSE
4.0000 [IU] | Freq: Two times a day (BID) | SUBCUTANEOUS | Status: DC
Start: 2022-01-17 — End: 2022-01-19
  Administered 2022-01-17 – 2022-01-18 (×2): 4 [IU] via SUBCUTANEOUS
  Administered 2022-01-18: 0 [IU] via SUBCUTANEOUS
  Administered 2022-01-19 (×2): 4 [IU] via SUBCUTANEOUS
  Filled 2022-01-17 (×4): qty 4

## 2022-01-17 MED ORDER — ATORVASTATIN 10 MG TABLET
20.0000 mg | ORAL_TABLET | Freq: Every evening | ORAL | Status: DC
Start: 2022-01-17 — End: 2022-01-20
  Administered 2022-01-17 – 2022-01-19 (×3): 20 mg via ORAL
  Filled 2022-01-17 (×3): qty 2

## 2022-01-17 NOTE — Care Plan (Signed)
Patient glucose level being monitored often patient has s/s ordered if needed. Patient diet given as requested. Patient bed alarm set for patient safety. Patient education on diet control and glucose medication management at home.

## 2022-01-17 NOTE — ED Nurses Note (Signed)
Transport at bedside  

## 2022-01-17 NOTE — PT Evaluation (Signed)
[facility]  [place], [address]  [phone]  [phone]  Rehabilitation Services  Physical Therapy Inpatient Initial Evaluation    Patient Name: Jordan Buckley  Date of Birth: 11/30/1940  Height: Height: 157.5 cm (5' 2.01")  Weight: Weight: 56.7 kg (125 lb)  Room/Bed: 430/B  Payor: MEDICARE / Plan: MEDICARE PART A AND B / Product Type: Medicare /       PMH:  Past Medical History:   Diagnosis Date    Alzheimer's dementia (CMS [place])     Arthropathy     Cancer (CMS [facility])     Coronary artery disease     CVA (cerebrovascular accident) (CMS [place])     Diabetes mellitus, type 2 (CMS [facility])     Hard of hearing     HTN (hypertension)     Osteoporosis     Thyroid disease     Wears glasses            Assessment:      (P) Pt presents with decreased safety in fucntional mobility and amb, fall risk.  Recommend PT this adm for improved safety.    Discharge Needs:    Equipment Recommendation: (P) front wheeled walker      The patient presents with mobility limitations due to impaired functional activity tolerance and gait stability  that significantly impair/prevent patient's ability to participate in mobility-related activities of daily living (MRADLs) including  ambulation and transfers in order to safely complete, toileting, bathing, safely entering/exiting the home. This functional mobility deficit can be sufficiently resolved with the use of a (P) front wheeled walker  in order to decrease the risk of falls, morbidity, and mortality in performance of these MRADLs.        Discharge Disposition: (P)  (possible need for inpt rehab due to living alone)    JUSTIFICATION OF DISCHARGE RECOMMENDATION   Based on current diagnosis, functional performance prior to admission, and current functional performance, this patient requires continued PT services in (P)  (possible need for inpt rehab due to living alone) in order to achieve significant functional improvements in  these deficit areas: (P) gait, locomotion, and balance.        Plan:   Current Intervention: (P) bed mobility training, gait training, transfer training  To provide physical therapy services (P)  (1-2x daiyl at least one day weekly)  for duration of (P) until discharge.    The risks/benefits of therapy have been discussed with the patient/caregiver and he/she is in agreement with the established plan of care.       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[place])     Arthropathy     Cancer (CMS [place])     Coronary artery disease     CVA (cerebrovascular accident) (CMS [place])     Diabetes mellitus, type 2 (CMS HCC)     Hard of hearing     HTN (hypertension)     Osteoporosis     Thyroid disease     Wears glasses             Past Surgical History:   Procedure Laterality Date    FIXATION KYPHOPLASTY Right 11/27/2021    HX BACK SURGERY      HX CATARACT REMOVAL      HX CESAREAN SECTION      HX CHOLECYSTECTOMY      HX  CORONARY ARTERY BYPASS GRAFT      HX HYSTERECTOMY      KIDNEY SURGERY Left     cancerous tumor removed    LIVER RESECTION      WRIST SURGERY Left                  01/17/22 1424   Rehab Session   Document Type evaluation   Total PT Minutes: 20   Patient Effort good   Symptoms Noted Comment no c/o's   General Information   Patient Profile Reviewed yes   Onset of Illness/Injury or Date of Surgery 01/16/22   Pertinent History of Current Functional Problem Seen in ER awaiting inpt bed; Hyperglycemia, labile DM.  Glucose 552, Hgb 10.9, Na 131.   Medical Lines Telemetry;PIV Line  (pulse oximeter)   Respiratory Status room air   Existing Precautions/Restrictions fall precautions   General Observations of Patient cleared for PT per nursing, pt receptive.  no acute distress.   Mutuality/Individual Preferences   Anxieties, Fears or Concerns getting DM under ontrol   Individualized Care Needs safety in amb   Patient-Specific Goals (Include Timeframe) go home   Plan of Care  Reviewed With patient   Living Environment   Lives With alone   Living Arrangements house   Home Assessment: No Problems Identified   Home Accessibility bed and bath on same level;tub/shower is not walk in   Transportation Available family or friend will provide   Living Environment Comment has received therapy in rehab before   Functional Level Prior   Ambulation 1 - assistive equipment   Transferring 1 - assistive equipment   Toileting 1 - assistive equipment   Bathing 1 - assistive equipment   Dressing 0 - independent   Eating 0 - independent   Communication 0 - understands/communicates without difficulty   Prior Functional Level Comment 3 wheeled walker used, shower chair in tub, grab bars, no O2 used   Pre Treatment Status   Pre Treatment Patient Status Patient supine in bed;Call light within reach;Nurse approved session  (stretcher in ER)   Support Present Pre Treatment  None   Communication Pre Treatment  Nurse   Communication Pre Treatment Comment cleared for PT   Cognitive Assessment/Interventions   Behavior/Mood Observations behavior appropriate to situation, WNL/WFL   Orientation Status oriented to;person;place   Attention mild impairment   Follows Commands WFL   Vital Signs   Pre-Treatment Heart Rate (beats/min) 97   Post-treatment Heart Rate (beats/min) 92   Pre Treatment BP 121/61   Post Treatment BP 108/75   Pre SpO2 (%) 95   O2 Delivery Pre Treatment room air   Post SpO2 (%) 98   O2 Delivery Post Treatment room air   Pain Assessment   Additional Documentation Numbers Pain Scale, Pre/Post Treatment (Group)   Pretreatment Pain Rating 5/10   Posttreatment Pain Rating 5/10   Pain Location - Side Bilateral   Pain Location back   Pre/Posttreatment Pain Comment and Rt shoulder   RUE Assessment   RUE Assessment WFL- Within Functional Limits   LUE Assessment   LUE Assessment WFL- Within Functional Limits   RLE Assessment   RLE Assessment WFL- Within Functional Limits   LLE Assessment   LLE Assessment WFL-  Within Functional Limits   Trunk Assessment   Trunk Assessment WFL-Within Functional Limits   Bed Mobility Assessment/Treatment   Supine-Sit Independence contact guard assist   Sit to Supine, Independence contact guard assist   Transfer Assessment/Treatment  Sit-Stand Independence contact guard assist   Stand-Sit Independence contact guard assist   Sit-Stand-Sit, Assist Device walker, front wheeled   Transfer Safety Issues loses balance backward   Gait Assessment/Treatment   Total Distance Ambulated 110   Independence  contact guard assist   Assistive Device  walker, front wheeled   Distance in Feet 110   Safety Issues    (too far behind FWW)   Comment knees remained flexed throughout gait;  instability   Balance Skill Training   Sitting Balance: Static fair balance   Sitting, Dynamic (Balance) fair - balance   Sit-to-Stand Balance fair - balance   Standing Balance: Static fair - balance   Post Treatment Status   Post Treatment Patient Status Patient supine in bed;Call light within reach   Support Present Post Treatment  None   Communication Post Treatement Nurse   Communication Post Treatment Comment status with PT   Plan of Care Review   Plan Of Care Reviewed With patient   Functional Impairment   Overall Functional Impairments/Problem List balance impaired   Physical Therapy Clinical Impression   Assessment Pt presents with decreased safety in fucntional mobility and amb, fall risk.  Recommend PT this adm for improved safety.   Criteria for Skilled Therapeutic meets criteria   Pathology/Pathophysiology Noted musculoskeletal;neuromuscular   Impairments Found (describe specific impairments) gait, locomotion, and balance   Functional Limitations in Following  self-care   Rehab Potential fair   Therapy Frequency   (1-2x daiyl at least one day weekly)   Predicted Duration of Therapy Intervention (days/wks) until discharge   Anticipated Equipment Needs at Discharge (PT) front wheeled walker   Anticipated Discharge  Disposition   (possible need for inpt rehab due to living alone)   Referral Needed to Another Service occupational therapy   Evaluation Complexity Justification   Patient History: Co-morbidity/factors that impact Plan of Care 3 or more that impact Plan of Care   Examination Components Balance;Bed mobility;Transfers;Ambulation   Presentation Stable: Uncomplicated, straight-forward, problem focused   Clinical Decision Making Low complexity   Evaluation Complexity Low complexity   Care Plan Goals   PT Rehab Goals Bed Mobility Goal;Gait Training Goal;Transfer Training Goal   Bed Mobility Goal   Bed Mobility Goal, Date Established 01/17/22   Bed Mobility Goal, Time to Achieve by discharge   Bed Mobility Goal, Activity Type roll left/roll right;scoot/bridge;supine to sit/sit to supine   Bed Mobility Goal, Independence Level supervision required   Gait Training  Goal, Distance to Achieve   Gait Training  Goal, Date Established 01/17/22   Gait Training  Goal, Time to Achieve by discharge   Gait Training  Goal, Independence Level stand-by assistance;verbal cues required   Gait Training  Goal, Assist Device walker, rolling   Gait Training  Goal, Distance to Achieve 100   Gait Training  Goal, Additional Goal improved positioning to [facility]   Transfer Training Goal   Transfer Training Goal, Date Established 01/17/22   Transfer Training Goal, Time to Achieve by discharge   Transfer Training Goal, Activity Type bed-to-chair/chair-to-bed;sit-to-stand/stand-to-sit   Transfer Training Goal, Independence Level stand-by assistance   Transfer Training Goal, Assist Device walker, rolling   Planned Therapy Interventions, PT Eval   Planned Therapy Interventions (PT) bed mobility training;gait training;transfer training   (INSERT FLOWSHEET)            INTERVENTION MINUTES: EVALUATION 20 minutes    EVALUATION COMPLEXITY : CLINICAL DECISION MAKING OF LOW COMPLEXITY AS INDICATED BY PMH, PHYSICAL THERAPY ASSESSMENT  OF MUSCULOSKELETAL AND  NEUROLOGICAL SYSTEMS AND ACTIVITY LIMITATIONS. CLINICAL PRESENTATION IS STABLE AND UNCOMPLICATED    Therapist:     Vonna Kotyk, PT  01/17/2022, 18:12

## 2022-01-17 NOTE — ED Nurses Note (Signed)
Report to [place], RN at this time.

## 2022-01-17 NOTE — Care Plan (Signed)
Problem: Hyperglycemia  Goal: Blood Glucose Level Within Targeted Range  Outcome: Ongoing (see interventions/notes)     Problem: Fall Injury Risk  Goal: Absence of Fall and Fall-Related Injury  Outcome: Ongoing (see interventions/notes)    Received patient from ER in a wheelchair.  A+O x 3, disoriented to time.  Very hard of hearing. Reports she has one hearing aid and left it at the Rehab facility.  NSR on tele, denies chest pains, good circulation to all extremities and no edema noted.  Auditory wheezing to upper lobes with diminished to bases.  SpO2 - 96% on room air.  Denies SOB. Patient uses 2L/NC at home PRN and at bedtime. Abdomen sunken, normoactive bowel sounds - LBM- 01/15/22, sometimes she moves bowels every day.  Good PO intake, no choking with swallowing.  Feeds self with tray set-up.  4 units of Lantus administered per schedule and patient immediately started to exhibit hypoglycemic symptoms.  Blood sugar - 44 and after drinking orange juice - 62.  Currently patient eating dinner and will follow-up.  Moves all extremities with aROM.  Gait unsteady and uses a walker at home.  Skin warm, thin but intact.  Patient currently at a [employer],  Daughter and patient will make a decision if she will go to SNF or back to home.

## 2022-01-17 NOTE — H&P (Signed)
[facility]   H&P    Jordan Buckley 81 y.o. female ED21/ED21   Date of Service: 01/17/2022    Date of Admission:  01/16/2022   PCP: Jordan Paterson, PA-C Code Status:Prior       Chief Complaint:      HPI: Jordan Buckley is a 81 y.o., White female surgical history of hypertension, type 2 diabetes, hypothyroidism, DKA, dementia, CVA who presents with hyperglycemia.  Patient stated that she has been having elevated blood sugars.  She has some nausea abdominal pain with it.  She did have some dysuria.  No fever no chills or shortness of breath no vomiting or diarrhea.      In emergency room her initial blood sugar was over 500.  She received IV fluids and also 10 units of regular insulin.  That did not improve her blood sugars so she received another 20 units of regular subcutaneous insulin and had a rapid drop in her blood sugar into the 40s.  She was then given 1 amp of dextrose along with food juices soda and her glucose improved into the 60s and she was given another amp of dextrose and that her blood sugar shot up to 509.  Due to labile nature of blood sugars ED asked [place] to admit this patient.         ROS: 14 point ROS (--) except for symptoms discussed above    PMHx:    Past Medical History:   Diagnosis Date   . Alzheimer's dementia (CMS [place])    . Arthropathy    . Cancer (CMS [place])    . Coronary artery disease    . CVA (cerebrovascular accident) (CMS [place])    . Diabetes mellitus, type 2 (CMS [place])    . Hard of hearing    . HTN (hypertension)    . Osteoporosis    . Thyroid disease    . Wears glasses         PSHx:   Past Surgical History:   Procedure Laterality Date   . FIXATION KYPHOPLASTY Right 11/27/2021   . HX BACK SURGERY     . HX CATARACT REMOVAL     . HX CESAREAN SECTION     . HX CHOLECYSTECTOMY     . HX CORONARY ARTERY BYPASS GRAFT     . HX HYSTERECTOMY     . KIDNEY SURGERY Left     cancerous tumor removed   . LIVER RESECTION     . WRIST SURGERY Left           Allergies:    Allergies    Allergen Reactions   . Cefaclor Rash   . Nitrofurantoin Rash   . Quinine Rash   . Sulfa (Sulfonamides) Rash   . Ciprofloxacin    . Dulaglutide    . Fluconazole    . Nitrofurantoin Macrocrystal    . Quinidine-Quinine Analogues (Cinchona Alkaloids)     Social History  Social History     Tobacco Use   . Smoking status: Never   . Smokeless tobacco: Never   Vaping Use   . Vaping Use: Never used   Substance Use Topics   . Alcohol use: Never   . Drug use: Never       Family History  Family Medical History:     Problem Relation (Age of Onset)    No Known Problems Mother, Father             Home Meds:  Prior to Admission medications    Medication Sig Start Date End Date Taking? Authorizing Provider   amLODIPine (NORVASC) 10 mg Oral Tablet Take 1 Tablet (10 mg total) by mouth Once a day 01/09/22   [name], MD   apixaban Arne [place]) 2.5 mg Oral Tablet Take 2 Tablets (5 mg total) by mouth Twice daily    Provider, Historical   atorvastatin (LIPITOR) 20 mg Oral Tablet Take 1 Tablet (20 mg total) by mouth Every evening    Provider, Historical   docusate sodium (COLACE) 100 mg Oral Capsule Take 1 Capsule (100 mg total) by mouth Once a day 12/20/21   Sherol Dade, PA-C   ergocalciferol, vitamin D2, (DRISDOL) 1,250 mcg (50,000 unit) Oral Capsule Vitamin D2 1,250 mcg (50,000 unit) capsule   Take by oral route.    Provider, Historical   ferrous sulfate (FEOSOL) 325 mg (65 mg iron) Oral Tablet Take 1 Tablet (325 mg total) by mouth Twice daily with food    Provider, Historical   insulin glargine 100 unit/mL Subcutaneous injection Inject 4 Units under the skin Every 12 hours 01/08/22   [name], MD   insulin R human (HUMULIN R) 100 units/mL Subcutaneous Injectable Inject 0-12 Units under the skin Three times daily before meals 01/08/22   [name], MD   levothyroxine (SYNTHROID) 75 mcg Oral Tablet Take 1 Tablet (75 mcg total) by mouth Every morning    Provider, Historical   magnesium oxide (MAG-OX) 400 mg Oral Tablet  Take 1 Tablet (400 mg total) by mouth Twice daily    Provider, Historical   MetFORMIN (GLUCOPHAGE) 1,000 mg Oral Tablet Take 1 Tablet (1,000 mg total) by mouth Twice daily with food    Provider, Historical   ondansetron (ZOFRAN ODT) 4 mg Oral Tablet, Rapid Dissolve Take 1 Tablet (4 mg total) by mouth Every 8 hours as needed for Nausea/Vomiting 12/03/21   Benard Rink, APRN   pramipexole (MIRAPEX) 0.125 mg Oral Tablet Take 1 Tablet (0.125 mg total) by mouth Three times a day    Provider, Historical   BASAGLAR KWIKPEN U-100 INSULIN 100 unit/mL (3 mL) Subcutaneous injection (KwikPen)  09/10/21 12/20/21  Provider, Historical   HYDROcodone-acetaminophen (NORCO) 5-325 mg Oral Tablet Take 1 Tablet by mouth Every 4 hours as needed 12/20/21 01/08/22  Sherol Dade, PA-C   insulin lispro (HUMALOG KWIKPEN INSULIN) 200 unit/mL (3 mL) Subcutaneous Insulin Pen 10 Units Once a day 12/17/17 12/20/21  Provider, Historical   insulin R human (HUMULIN R) 100 units/mL Subcutaneous Injectable Inject 0-12 Units under the skin Four times a day as needed for Other CAUTION:  "High Alert" Medication. 12/20/21 01/08/22  Sherol Dade, PA-C   NOVOLOG FLEXPEN U-100 INSULIN 100 unit/mL (3 mL) Subcutaneous Insulin Pen  09/10/21 12/20/21  Provider, Historical   traMADoL (ULTRAM) 50 mg Oral Tablet Take by mouth Every 6 hours as needed for Pain  12/20/21  Provider, Historical        PHYSICAL EXAM:  VITALS: BP 123/61   Pulse 84   Temp 36.9 C (98.5 F)   Resp (!) 21   Ht 1.575 m ('5\' 2"'$ )   Wt 56.7 kg (125 lb)   SpO2 93%   BMI 22.86 kg/m         GENERAL:   Pt is a pleasant, well-nourished, well-developed 81 y.o. female who is resting comfortably in bed in NAD. Appears stated age  33:  head normocephalic, symmetrical facies. EOM intact b/l. Sclera non-icteric, non-injected. Oropharyngeal mucous  membranes are moist w/o erythema/exudates   CV: RRR. No murmurs. No BLE edema. 2+ pulses present b/l.   LUNGS: CTAB, No rhonchi, rales, wheezes.    BREAST: deferred   RECTAL: deferred  GI: (+) BS in all 4 quadrants. soft, NT/ ND. No rigidity/ guarding/ rebound.  GU: deferred  MSK: full spontaneous ROM of all 4 extremities. Gait not assessed at this time.  SKIN: No significant lesions, rashes, ecchymoses noted.   NEURO: AAOx4, CN grossly intact. Sensation intact b/l. No focal deficits.  PSYCH: Mood, behavior and affect normal w/ Intact judgement & insight     Today's labs, Imaging report and EKG reviewed.      Assessments/Plan:  1. Labile diabetes type 2.  Continue IV fluids with normal saline.  Hold home metformin.  Continue Lantus 4 units bid.  Sliding scale insulin.  Due to patient's dementia unclear if patient takes her medications and insulin appropriately.  She will definitely need skilled nursing visit for medication check at discharge or she could benefit from long term care in nursing home. Further titration of insulin based on Blood sugar readings. PT.   2. For her remaining diagnosis of hypertension, hypothyroidism, dementia will continue patient's home medications         Curt Jews, MD

## 2022-01-17 NOTE — ED Nurses Note (Signed)
Patient used bed pan and was cleaned. Now drinking drinking OJ.

## 2022-01-17 NOTE — ED APP Handoff Note (Signed)
After transfer of care to me patient was awaiting blood sugar control.  Patient continued to have labile blood sugar throughout the day ranging anywhere from 300-500.  I did discuss various treatment options with the patient.  I did discuss the possibility of the patient going home and following up with endocrinology with her.  She becomes very tearful at that time stating she does not know what to do.  She is concerned about having hypoglycemic episodes at home.  I did discuss case with Dr. Robert Bellow who will evaluate the patient for admission.

## 2022-01-17 NOTE — ED Nurses Note (Signed)
Patient transported to floor at this time.

## 2022-01-18 DIAGNOSIS — F0390 Unspecified dementia without behavioral disturbance: Secondary | ICD-10-CM

## 2022-01-18 DIAGNOSIS — Z794 Long term (current) use of insulin: Secondary | ICD-10-CM

## 2022-01-18 LAB — ECG 12 LEAD
Atrial Rate: 89 {beats}/min
Calculated P Axis: 70 degrees
Calculated R Axis: 90 degrees
Calculated T Axis: 80 degrees
PR Interval: 164 ms
QRS Duration: 90 ms
QT Interval: 346 ms
QTC Calculation: 420 ms
Ventricular rate: 89 {beats}/min

## 2022-01-18 LAB — BASIC METABOLIC PANEL
ANION GAP: 7 mmol/L — ABNORMAL LOW (ref 10–20)
BUN/CREA RATIO: 18 (ref 6–22)
BUN: 13 mg/dL (ref 7–25)
CALCIUM: 8.7 mg/dL (ref 8.6–10.3)
CHLORIDE: 103 mmol/L (ref 98–107)
CO2 TOTAL: 26 mmol/L (ref 21–31)
CREATININE: 0.72 mg/dL (ref 0.60–1.30)
ESTIMATED GFR: 84 mL/min/{1.73_m2} (ref >59)
GLUCOSE: 109 mg/dL (ref 74–109)
OSMOLALITY, CALCULATED: 273 mOsm/kg (ref 270–290)
POTASSIUM: 4.1 mmol/L (ref 3.5–5.1)
SODIUM: 136 mmol/L (ref 136–145)

## 2022-01-18 LAB — CBC WITH DIFF
BASOPHIL #: 0.1 10*3/uL (ref 0.00–0.30)
BASOPHIL %: 1 % (ref 0–3)
EOSINOPHIL #: 0.2 10*3/uL (ref 0.00–0.80)
EOSINOPHIL %: 5 % (ref 0–7)
HCT: 35.3 % — ABNORMAL LOW (ref 37.0–47.0)
HGB: 11.8 g/dL — ABNORMAL LOW (ref 12.5–16.0)
LYMPHOCYTE #: 1.7 10*3/uL (ref 1.10–5.00)
LYMPHOCYTE %: 36 % (ref 25–45)
MCH: 31.2 pg (ref 27.0–32.0)
MCHC: 33.5 g/dL (ref 32.0–36.0)
MCV: 93.2 fL (ref 78.0–99.0)
MONOCYTE #: 0.4 10*3/uL (ref 0.00–1.30)
MONOCYTE %: 8 % (ref 0–12)
MPV: 7 fL — ABNORMAL LOW (ref 7.4–10.4)
NEUTROPHIL #: 2.3 10*3/uL (ref 1.80–8.40)
NEUTROPHIL %: 50 % (ref 40–76)
PLATELETS: 270 10*3/uL (ref 140–440)
RBC: 3.78 10*6/uL — ABNORMAL LOW (ref 4.20–5.40)
RDW: 15.7 % — ABNORMAL HIGH (ref 11.6–14.8)
WBC: 4.7 10*3/uL (ref 4.0–10.5)
WBCS UNCORRECTED: 4.7 10*3/uL

## 2022-01-18 LAB — POC BLOOD GLUCOSE (RESULTS)
GLUCOSE, POC: 66 mg/dl (ref 50–500)
GLUCOSE, POC: 91 mg/dl (ref 50–500)
GLUCOSE, POC: 67 mg/dl (ref 50–500)
GLUCOSE, POC: 249 mg/dl (ref 50–500)
GLUCOSE, POC: 440 mg/dl (ref 50–500)
GLUCOSE, POC: 553 mg/dl (ref 50–500)
GLUCOSE, POC: 359 mg/dl (ref 50–500)
GLUCOSE, POC: 300 mg/dl (ref 50–500)
GLUCOSE, POC: 93 mg/dl (ref 50–500)

## 2022-01-18 NOTE — Nurses Notes (Signed)
PT PROVIDED A CARB AND PROTEIN HS SNACK TO PREVENT HYPOGLYCEMIA IN THEIR SLEEP.

## 2022-01-18 NOTE — Progress Notes (Signed)
[facility]    HOSPITALIST PROGRESS NOTE    Jordan Buckley  Date of service: 01/18/2022  Date of Admission:  01/16/2022  Hospital Day:  LOS: 1 day     Subjective:   Patient seen in follow up for hyperglycemia.  Patient was resting comfortably at the time of my examination.  No new complaints today. Blood glucose more stable today.     A focused review of symptoms was performed and is negative unless specified in HPI/subjective.    Vital Signs:  Filed Vitals:    01/18/22 0337 01/18/22 0742 01/18/22 1055 01/18/22 1352   BP: (!) 120/58 123/62  (!) 102/57   Pulse: 73 81 80 83   Resp:  18  18   Temp:  36.5 C (97.7 F)  36.9 C (98.5 F)   SpO2:    96%        Physical Exam:  GENERAL: No acute distress, Resting comfortably, Alert and oriented x3    SKIN: warm and dry.    CARDIAC: S1 and S2, no murmurs, clicks, or gallops. No S3.    LUNGS: Clear to auscultation bilaterally. No wheezes or rhonchi. No accessory muscle use noted.    GI: Abdomen soft, nontender. Good bowel sounds x 4. No organomegaly appreciated.    GU: No suprapubic tenderness. No costovertebral tenderness.    EXTREMITIES: No pedal edema noted. Distal pulses equal bilaterally.    NEUROLOGICAL: Cranial nerves grossly intact. No gross focal deficit.     Current Medications:  acetaminophen (TYLENOL) tablet, 650 mg, Oral, Q4H PRN  amLODIPine (NORVASC) tablet, 10 mg, Oral, Daily  apixaban (ELIQUIS) tablet, 5 mg, Oral, 2x/day  atorvastatin (LIPITOR) tablet, 20 mg, Oral, QPM  dextrose 50% (0.5 g/mL) injection - syringe, 25 g, Intravenous, Q15 Min PRN   Or  dextrose 50% (0.5 g/mL) injection - syringe, 12.5 g, Intravenous, Q15 Min PRN   Or  glucagon (GLUCAGEN DIAGNOSTIC KIT) injection 1 mg, 1 mg, Subcutaneous, Q15 Min PRN   Or  glucagon (GLUCAGEN DIAGNOSTIC KIT) injection 1 mg, 1 mg, IntraMUSCULAR, Q15 Min PRN  docusate sodium (COLACE) capsule, 100 mg, Oral, Daily  ferrous sulfate 324 mg (65 mg elemental IRON) tablet, 324 mg, Oral,  Daily before Breakfast  insulin glargine 100 units/mL injection, 4 Units, Subcutaneous, Q12H  levothyroxine (SYNTHROID) tablet, 75 mcg, Oral, QAM  magnesium oxide (MAG-OX) 427m (2481melemental magnesium) tablet, 400 mg, Oral, 2x/day  ondansetron (ZOFRAN ODT) rapid dissolve tablet, 4 mg, Oral, Q8H PRN  pramipexole (MIRAPEX) tablet, 0.125 mg, Oral, 3x/day  SSIP insulin R human (HUMULIN R) 100 units/mL injection, 0-12 Units, Subcutaneous, 4x/day PRN        Labs:  CBC:     4.7 (06/10 0322) \   11.8* (06/10 0322) /   270 (06/10 033009     / 35.3* (06/10 0322) \          BMP:   136 (06/10 032330103 (06/10 0322) 13 (06/10 030762   /     109 (06/10 0322)   4.1 (06/10 0322) 26 (06/10 0322) 0.72 (06/10 0322) \              No results found for this or any previous visit (from the past 24 hour(s)).       Radiology:  CT ABDOMEN PELVIS W IV CONTRAST    Result Date: 01/16/2022  Impression NO ACUTE FINDINGS AT THE ABDOMEN OR PELVIS ON CONTRAST-ENHANCED CT. One or more dose  reduction techniques were used (e.g., Automated exposure control, adjustment of the mA and/or kV according to patient size, use of iterative reconstruction technique). Radiologist location ID: [id number]        The laboratory results, imaging results and other diagnostic results were reviewed in the EMR.    Assessment/ Plan:   Active Hospital Problems   (*Primary Problem)    Diagnosis   . *Hyperglycemia     1. Labile diabetes type 2  Continue Lantus and sliding scale.  Continue to monitor blood glucose with accuchecks.  Continue diabetic diet.    Due to patient's dementia unclear if patient takes her medications and insulin appropriately.     2. For her remaining diagnosis of hypertension, hypothyroidism, dementia will continue patient's home medications    DVT/PE Prophylaxis: Enoxaparin    Disposition Planning: [facility]     On the day of the encounter, a total of  35 minutes was spent on this patient encounter including review of historical information,  examination, documentation and post-visit activities. The time documented excludes procedural time.     Vale Haven, DO FACP [facility]  01/18/2022  [place] HOSPITALIST

## 2022-01-18 NOTE — Nurses Notes (Signed)
Patient has c/o generalized pain today. She also complains of RUE numbness, pain, tingling that she feels is related to hx of rotator cuff injury. Blood sugar control has been difficult today. She has had hyperglycemia and sliding scale insulin coverage. She has had increased thirst due to the hyperglycemia. There has been no episode of hypoglycemia today. PO intake has been adequate today. Patient has been up to [facility] with assistance without difficulty.

## 2022-01-19 LAB — CBC WITH DIFF
BASOPHIL #: 0.1 10*3/uL (ref 0.00–0.30)
BASOPHIL %: 1 % (ref 0–3)
EOSINOPHIL #: 0.3 10*3/uL (ref 0.00–0.80)
EOSINOPHIL %: 5 % (ref 0–7)
HCT: 32.7 % — ABNORMAL LOW (ref 37.0–47.0)
HGB: 11.2 g/dL — ABNORMAL LOW (ref 12.5–16.0)
LYMPHOCYTE #: 1.4 10*3/uL (ref 1.10–5.00)
LYMPHOCYTE %: 29 % (ref 25–45)
MCH: 31.4 pg (ref 27.0–32.0)
MCHC: 34.3 g/dL (ref 32.0–36.0)
MCV: 91.6 fL (ref 78.0–99.0)
MONOCYTE #: 0.3 10*3/uL (ref 0.00–1.30)
MONOCYTE %: 8 % (ref 0–12)
MPV: 6.5 fL — ABNORMAL LOW (ref 7.4–10.4)
NEUTROPHIL #: 2.6 10*3/uL (ref 1.80–8.40)
NEUTROPHIL %: 57 % (ref 40–76)
PLATELETS: 247 10*3/uL (ref 140–440)
RBC: 3.57 10*6/uL — ABNORMAL LOW (ref 4.20–5.40)
RDW: 15.1 % — ABNORMAL HIGH (ref 11.6–14.8)
WBC: 4.6 10*3/uL (ref 4.0–10.5)
WBCS UNCORRECTED: 4.6 10*3/uL

## 2022-01-19 LAB — COMPREHENSIVE METABOLIC PANEL, NON-FASTING
ALBUMIN/GLOBULIN RATIO: 1.3 (ref 0.8–1.4)
ALBUMIN: 3.3 g/dL — ABNORMAL LOW (ref 3.5–5.7)
ALKALINE PHOSPHATASE: 68 U/L (ref 34–104)
ALT (SGPT): <7 U/L — ABNORMAL LOW (ref 7–52)
ANION GAP: 6 mmol/L — ABNORMAL LOW (ref 10–20)
AST (SGOT): 12 U/L — ABNORMAL LOW (ref 13–39)
BILIRUBIN TOTAL: 0.4 mg/dL (ref 0.3–1.2)
BUN/CREA RATIO: 25 — ABNORMAL HIGH (ref 6–22)
BUN: 18 mg/dL (ref 7–25)
CALCIUM, CORRECTED: 9.4 mg/dL (ref 8.9–10.8)
CALCIUM: 8.7 mg/dL (ref 8.6–10.3)
CHLORIDE: 102 mmol/L (ref 98–107)
CO2 TOTAL: 27 mmol/L (ref 21–31)
CREATININE: 0.72 mg/dL (ref 0.60–1.30)
ESTIMATED GFR: 84 mL/min/{1.73_m2} (ref >59)
GLOBULIN: 2.5 — ABNORMAL LOW (ref 2.9–5.4)
GLUCOSE: 207 mg/dL — ABNORMAL HIGH (ref 74–109)
OSMOLALITY, CALCULATED: 278 mOsm/kg (ref 270–290)
POTASSIUM: 4.4 mmol/L (ref 3.5–5.1)
PROTEIN TOTAL: 5.8 g/dL — ABNORMAL LOW (ref 6.4–8.9)
SODIUM: 135 mmol/L — ABNORMAL LOW (ref 136–145)

## 2022-01-19 LAB — POC BLOOD GLUCOSE (RESULTS)
GLUCOSE, POC: 171 mg/dl (ref 50–500)
GLUCOSE, POC: 85 mg/dl (ref 50–500)
GLUCOSE, POC: 256 mg/dl (ref 50–500)
GLUCOSE, POC: 433 mg/dl (ref 50–500)
GLUCOSE, POC: 469 mg/dl (ref 50–500)
GLUCOSE, POC: 386 mg/dl (ref 50–500)

## 2022-01-19 MED ORDER — INSULIN GLARGINE 100 UNITS/ML SUBQ - CHARGE BY DOSE
7.0000 [IU] | Freq: Two times a day (BID) | SUBCUTANEOUS | Status: DC
Start: 2022-01-20 — End: 2022-01-20
  Administered 2022-01-20: 7 [IU] via SUBCUTANEOUS
  Filled 2022-01-19: qty 7

## 2022-01-19 NOTE — Progress Notes (Signed)
Jordan Buckley    HOSPITALIST PROGRESS NOTE    Jordan Buckley  Date of service: 01/19/2022  Date of Admission:  01/16/2022  Hospital Day:  LOS: 2 days     Subjective:   Patient seen in follow up for hyperglycemia.  Patient was resting comfortably at the time of my examination.  No new complaints today. Blood glucose stable.  A1c last month was 9.6.    A focused review of symptoms was performed and is negative unless specified in HPI/subjective.    Vital Signs:  Filed Vitals:    01/19/22 0100 01/19/22 0741 01/19/22 1056 01/19/22 1420   BP: 110/62 (!) 99/59  (!) 115/57   Pulse: 85 85 86 85   Resp: _0 Temp: 36.7 C (98.1 F) 36.4 C (97.5 F)  36.2 C (97.2 F)   SpO2: 96% 94%  91%        Physical Exam:  GENERAL: No acute distress, Resting comfortably, Alert and oriented x3    SKIN: warm and dry.    CARDIAC: S1 and S2, no murmurs, clicks, or gallops. No S3.    LUNGS: Clear to auscultation bilaterally. No wheezes or rhonchi. No accessory muscle use noted.    GI: Abdomen soft, nontender. Good bowel sounds x 4. No organomegaly appreciated.    GU: No suprapubic tenderness. No costovertebral tenderness.    EXTREMITIES: No pedal edema noted. Distal pulses equal bilaterally.    NEUROLOGICAL: Cranial nerves grossly intact. No gross focal deficit.     Current Medications:  acetaminophen (TYLENOL) tablet, 650 mg, Oral, Q4H PRN  amLODIPine (NORVASC) tablet, 10 mg, Oral, Daily  apixaban (ELIQUIS) tablet, 5 mg, Oral, 2x/day  atorvastatin (LIPITOR) tablet, 20 mg, Oral, QPM  dextrose 50% (0.5 g/mL) injection - syringe, 25 g, Intravenous, Q15 Min PRN   Or  dextrose 50% (0.5 g/mL) injection - syringe, 12.5 g, Intravenous, Q15 Min PRN   Or  glucagon (GLUCAGEN DIAGNOSTIC KIT) injection 1 mg, 1 mg, Subcutaneous, Q15 Min PRN   Or  glucagon (GLUCAGEN DIAGNOSTIC KIT) injection 1 mg, 1 mg, IntraMUSCULAR, Q15 Min PRN  docusate sodium (COLACE) capsule, 100 mg, Oral, Daily  ferrous sulfate 324 mg (65 mg  elemental IRON) tablet, 324 mg, Oral, Daily before Breakfast  [START ON 01/20/2022] insulin glargine 100 units/mL injection, 7 Units, Subcutaneous, Q12H  levothyroxine (SYNTHROID) tablet, 75 mcg, Oral, QAM  magnesium oxide (MAG-OX) 486m (248melemental magnesium) tablet, 400 mg, Oral, 2x/day  ondansetron (ZOFRAN ODT) rapid dissolve tablet, 4 mg, Oral, Q8H PRN  pramipexole (MIRAPEX) tablet, 0.125 mg, Oral, 3x/day  SSIP insulin R human (HUMULIN R) 100 units/mL injection, 0-12 Units, Subcutaneous, 4x/day PRN        Labs:  CBC:     4.6 (06/11 0315) \   11.2* (06/11 0315) /   247 (06/11 0315)      / 32.7* (06/11 0315) \          BMP:   135* (06/11 0315) 102 (06/11 0315) 18 (06/11 0315)    /     207* (06/11 0315)   4.4 (06/11 0315) 27 (06/11 0315) 0.72 (06/11 0315) \              Recent Results (from the past 24 hour(s))   COMPREHENSIVE METABOLIC PANEL, NON-FASTING    Collection Time: 01/19/22  3:15 AM   Result Value    ALKALINE PHOSPHATASE 68    ALT (SGPT) <7 (L)  AST (SGOT) 12 (L)          Radiology:  No results found.     The laboratory results, imaging results and other diagnostic results were reviewed in the EMR.    Assessment/ Plan:   Active Hospital Problems   (*Primary Problem)    Diagnosis   . *Hyperglycemia     1. Labile diabetes type 2  Continue Lantus and sliding scale.  Continue to monitor blood glucose with accuchecks.  Continue diabetic diet.    Due to patient's dementia unclear if patient takes her medications and insulin appropriately.   Case management for DC planning.    2. For her remaining diagnosis of hypertension, hypothyroidism, dementia will continue patient's home medications    DVT/PE Prophylaxis: Enoxaparin    Disposition Planning: [facility]     On the day of the encounter, a total of  35 minutes was spent on this patient encounter including review of historical information, examination, documentation and post-visit activities. The time documented excludes procedural time.      Vale Haven, DO FACP [facility]  01/19/2022  [place] HOSPITALIST

## 2022-01-19 NOTE — Care Plan (Signed)
Problem: Health Knowledge, Opportunity to Enhance (Adult,Obstetrics,Pediatric)  Goal: Knowledgeable about Health Subject/Topic  Description: Patient will demonstrate the desired outcomes by discharge/transition of care.  Outcome: Ongoing (see interventions/notes)     Problem: Adult Inpatient Plan of Care  Goal: Plan of Care Review  Outcome: Ongoing (see interventions/notes)  Goal: Patient-Specific Goal (Individualized)  Outcome: Ongoing (see interventions/notes)  Flowsheets (Taken 01/19/2022 0900)  Individualized Care Needs: monitor blood glucose  Anxieties, Fears or Concerns: Blood Glucose  Patient-Specific Goals (Include Timeframe): control blood glucose  Goal: Absence of Hospital-Acquired Illness or Injury  Outcome: Ongoing (see interventions/notes)  Goal: Optimal Comfort and Wellbeing  Outcome: Ongoing (see interventions/notes)  Goal: Rounds/Family Conference  Outcome: Ongoing (see interventions/notes)     Problem: Hyperglycemia  Goal: Blood Glucose Level Within Targeted Range  Outcome: Ongoing (see interventions/notes)     Problem: Fall Injury Risk  Goal: Absence of Fall and Fall-Related Injury  Outcome: Ongoing (see interventions/notes)

## 2022-01-20 DIAGNOSIS — Z9981 Dependence on supplemental oxygen: Secondary | ICD-10-CM

## 2022-01-20 LAB — COMPREHENSIVE METABOLIC PANEL, NON-FASTING
ALBUMIN/GLOBULIN RATIO: 1.3 (ref 0.8–1.4)
ALBUMIN: 3.3 g/dL — ABNORMAL LOW (ref 3.5–5.7)
ALKALINE PHOSPHATASE: 73 U/L (ref 34–104)
ALT (SGPT): <7 U/L — ABNORMAL LOW (ref 7–52)
ANION GAP: 3 mmol/L — ABNORMAL LOW (ref 10–20)
AST (SGOT): 11 U/L — ABNORMAL LOW (ref 13–39)
BILIRUBIN TOTAL: 0.4 mg/dL (ref 0.3–1.2)
BUN/CREA RATIO: 32 — ABNORMAL HIGH (ref 6–22)
BUN: 22 mg/dL (ref 7–25)
CALCIUM, CORRECTED: 9.5 mg/dL (ref 8.9–10.8)
CALCIUM: 8.8 mg/dL (ref 8.6–10.3)
CHLORIDE: 99 mmol/L (ref 98–107)
CO2 TOTAL: 30 mmol/L (ref 21–31)
CREATININE: 0.68 mg/dL (ref 0.60–1.30)
ESTIMATED GFR: 87 mL/min/{1.73_m2} (ref >59)
GLOBULIN: 2.5 — ABNORMAL LOW (ref 2.9–5.4)
GLUCOSE: 214 mg/dL — ABNORMAL HIGH (ref 74–109)
OSMOLALITY, CALCULATED: 274 mOsm/kg (ref 270–290)
POTASSIUM: 4.7 mmol/L (ref 3.5–5.1)
PROTEIN TOTAL: 5.8 g/dL — ABNORMAL LOW (ref 6.4–8.9)
SODIUM: 132 mmol/L — ABNORMAL LOW (ref 136–145)

## 2022-01-20 LAB — CBC WITH DIFF
BASOPHIL #: 0.1 10*3/uL (ref 0.00–0.30)
BASOPHIL %: 1 % (ref 0–3)
EOSINOPHIL #: 0.2 10*3/uL (ref 0.00–0.80)
EOSINOPHIL %: 4 % (ref 0–7)
HCT: 31.6 % — ABNORMAL LOW (ref 37.0–47.0)
HGB: 10.7 g/dL — ABNORMAL LOW (ref 12.5–16.0)
LYMPHOCYTE #: 1.2 10*3/uL (ref 1.10–5.00)
LYMPHOCYTE %: 24 % — ABNORMAL LOW (ref 25–45)
MCH: 31.1 pg (ref 27.0–32.0)
MCHC: 33.8 g/dL (ref 32.0–36.0)
MCV: 92 fL (ref 78.0–99.0)
MONOCYTE #: 0.4 10*3/uL (ref 0.00–1.30)
MONOCYTE %: 8 % (ref 0–12)
MPV: 6.3 fL — ABNORMAL LOW (ref 7.4–10.4)
NEUTROPHIL #: 3 10*3/uL (ref 1.80–8.40)
NEUTROPHIL %: 63 % (ref 40–76)
PLATELETS: 255 10*3/uL (ref 140–440)
RBC: 3.43 10*6/uL — ABNORMAL LOW (ref 4.20–5.40)
RDW: 15.2 % — ABNORMAL HIGH (ref 11.6–14.8)
WBC: 4.8 10*3/uL (ref 4.0–10.5)
WBCS UNCORRECTED: 4.8 10*3/uL

## 2022-01-20 LAB — COVID-19 ~~LOC~~ MOLECULAR LAB TESTING
INFLUENZA VIRUS TYPE A: NOT DETECTED
INFLUENZA VIRUS TYPE B: NOT DETECTED
RESPIRATORY SYNCTIAL VIRUS (RSV): NOT DETECTED
SARS-CoV-2: NOT DETECTED

## 2022-01-20 LAB — POC BLOOD GLUCOSE (RESULTS)
GLUCOSE, POC: 188 mg/dl (ref 50–500)
GLUCOSE, POC: 202 mg/dl (ref 50–500)
GLUCOSE, POC: 273 mg/dl (ref 50–500)
GLUCOSE, POC: 304 mg/dl (ref 50–500)
GLUCOSE, POC: 71 mg/dl (ref 50–500)

## 2022-01-20 MED ORDER — INSULIN GLARGINE 100 UNITS/ML SUBQ - CHARGE BY DOSE
7.0000 [IU] | Freq: Two times a day (BID) | SUBCUTANEOUS | 0 refills | Status: DC
Start: 2022-01-20 — End: 2022-08-11

## 2022-01-20 MED ORDER — FERROUS SULFATE 325 MG (65 MG IRON) TABLET
325.0000 mg | ORAL_TABLET | Freq: Every day | ORAL | 1 refills | Status: AC
Start: 2022-01-20 — End: ?

## 2022-01-20 NOTE — Care Plan (Signed)
Problem: Health Knowledge, Opportunity to Enhance (Adult,Obstetrics,Pediatric)  Goal: Knowledgeable about Health Subject/Topic  Description: Patient will demonstrate the desired outcomes by discharge/transition of care.  Outcome: Ongoing (see interventions/notes)  Intervention: Enhance Health Knowledge  Recent Flowsheet Documentation  Taken 01/19/2022 2010 by Melonie Florida, RN  Supportive Measures:   active listening utilized   positive reinforcement provided  Intervention: Enhance Health Knowledge  Recent Flowsheet Documentation  Taken 01/19/2022 2010 by Melonie Florida, RN  Supportive Measures:   active listening utilized   positive reinforcement provided

## 2022-01-20 NOTE — PT Treatment (Signed)
[facility]  [place], [id number]  [phone]  [phone]  Rehabilitation Department  Physical Therapy Daily Inpatient Note    Date: 01/20/2022  Patient's Name: Jordan Buckley  Date of Birth: [birth year]  Height: Height: 157.5 cm (5' 2.01")  Weight: Weight: 56.7 kg (125 lb)      Plan: Will continue under current POC.         Subjective/Objective/Assessment:  Flowsheet    01/20/22 1007   Rehab Session   Document Type therapy progress note (daily note)   Total PT Minutes: 10   Patient Effort good   General Information   Patient Profile Reviewed yes   Respiratory Status room air   Existing Precautions/Restrictions fall precautions   General Observations of Patient cleared   Cognitive Assessment/Interventions   Behavior/Mood Observations behavior appropriate to situation, WNL/WFL   Orientation Status oriented x 3   Follows Commands WFL   Vital Signs   Pre-Treatment Heart Rate (beats/min) 77   Post-treatment Heart Rate (beats/min) 88   Pre SpO2 (%) 94   O2 Delivery Pre Treatment room air   Post SpO2 (%) 91   O2 Delivery Post Treatment room air   Pain Assessment   Additional Documentation Numbers Pain Scale, Pre/Post Treatment (Group)   Pain Intervention  PRN Medication   Pretreatment Pain Rating 8/10   Posttreatment Pain Rating 8/10   Pain Location - Side Right   Pain Location hip   Bed Mobility Assessment/Treatment   Bed Mobility, Assistive Device bed rails   Supine-Sit Independence contact guard assist   Sit to Supine, Independence contact guard assist   Transfer Assessment/Treatment   Sit-Stand Independence contact guard assist   Stand-Sit Independence contact guard assist   Sit-Stand-Sit, Assist Device walker, front wheeled   Toilet Transfer Independence contact guard assist   Toilet Transfer Assist Device walker, front wheeled   Transfer Impairments balance impaired;endurance;strength decreased   Gait Assessment/Treatment   Total Distance Ambulated  180   Independence  contact guard assist   Assistive Device  walker, front wheeled   Comment knees still in flexed position   Balance Skill Training   Sitting Balance: Static fair balance   Sitting, Dynamic (Balance) fair balance   Sit-to-Stand Balance fair - balance   Standing Balance: Static fair - balance   Post Treatment Status   Post Treatment Patient Status Patient supine in bed   Support Present Post Treatment  None   Communication Post Treatment Comment bed alarmed   Functional Impairment   Overall Functional Impairments/Problem List balance impaired;endurance;strength decreased   Physical Therapy Clinical Impression   Assessment patient is in the bed, transfered supine to sit with cga of 1, standing with cga of 1, gaited with rw 175f with cga, no major lob noted, slight unsteady at times, btb with needs inreach, bed alamred,                 Intervention minutes: GAIT TRAINING 10 MINUTES    THERAPIST  VJulio Sicks PTA  01/20/2022, 12:41

## 2022-01-20 NOTE — Nurses Notes (Signed)
**Note De-Identified  Obfuscation** Report was called to [facility] at this time.

## 2022-01-20 NOTE — Progress Notes (Signed)
EMS transported patient off floor via stretcher. Patient ambulated to stretcher with 1 assist, IV was removed by day shift, telemetry was removed by day shift. Vitals checked before discharge and are stable. Patient is currently breathing well on room air. All belongings sent with patient and discharge folder given to EMS.

## 2022-01-20 NOTE — Discharge Summary (Signed)
[facility]  DISCHARGE SUMMARY    PATIENT NAME:  Jordan, Buckley  MRN:  [id number]  DOB:  [birth year]    ENCOUNTER DATE:  01/16/2022  INPATIENT ADMISSION DATE: 01/17/2022  DISCHARGE DATE:  01/20/2022    ATTENDING PHYSICIAN: Vale Haven, DO  SERVICE: PRN HOSPITALIST 4  PRIMARY CARE PHYSICIAN: Lerry Paterson, [place]       Lay Caregiver Name: Gabriel Rainwater Caregiver Contact Number: [phone]   Lay Caregiver Relationship to patient: child      PRIMARY DISCHARGE DIAGNOSIS: Hyperglycemia  Active Hospital Problems    Diagnosis Date Noted   . Principal Problem: Hyperglycemia [R73.9] 01/17/2022      Resolved Hospital Problems   No resolved problems to display.     Active Non-Hospital Problems    Diagnosis Date Noted   . Closed compression fracture of L1 vertebra (CMS HCC) 12/20/2021   . Diabetes mellitus, type 2 (CMS [place]) 12/20/2021   . Dementia (CMS [place]) 12/17/2021   . Anticoagulant long-term use 12/17/2021   . Chest pain 12/16/2021   . Back arthralgia 12/16/2021   . Weakness 12/16/2021   . Osteoarthrosis 04/27/2002   . Osteoporosis 04/27/2002           Current Discharge Medication List      CONTINUE these medications which have CHANGED during your visit.      Details   ferrous sulfate 325 mg (65 mg iron) Tablet  Commonly known as: FEOSOL  What changed: when to take this   325 mg, Oral, DAILY  Qty: 30 Tablet  Refills: 1     insulin glargine 100 unit/mL injection  What changed: how much to take   7 Units, Subcutaneous, EVERY 12 HOURS  Qty: 5 mL  Refills: 0        CONTINUE these medications - NO CHANGES were made during your visit.      Details   apixaban 2.5 mg Tablet  Commonly known as: ELIQUIS   5 mg, Oral, 2 TIMES DAILY  Refills: 0     atorvastatin 20 mg Tablet  Commonly known as: LIPITOR   20 mg, Oral, EVERY EVENING  Refills: 0     docusate sodium 100 mg Capsule  Commonly known as: COLACE   100 mg, Oral, DAILY  Qty: 1 Capsule  Refills: 0     ergocalciferol (vitamin D2) 1,250 mcg (50,000 unit)  Capsule  Commonly known as: DRISDOL   Vitamin D2 1,250 mcg (50,000 unit) capsule   Take by oral route.  Refills: 0     insulin R human 100 units/mL Injectable  Commonly known as: HUMULIN R   0-12 Units, Subcutaneous, 3 TIMES DAILY BEFORE MEALS  Refills: 0     levothyroxine 75 mcg Tablet  Commonly known as: SYNTHROID   75 mcg, Oral, EVERY MORNING  Refills: 0     magnesium oxide 400 mg Tablet  Commonly known as: MAG-OX   400 mg, Oral, 2 TIMES DAILY  Refills: 0     MetFORMIN 1,000 mg Tablet  Commonly known as: GLUCOPHAGE   1,000 mg, Oral, 2 TIMES DAILY WITH FOOD  Refills: 0     ondansetron 4 mg Tablet, Rapid Dissolve  Commonly known as: ZOFRAN ODT   4 mg, Oral, EVERY 8 HOURS PRN  Qty: 12 Tablet  Refills: 0     pramipexole 0.125 mg Tablet  Commonly known as: MIRAPEX   0.125 mg, Oral, 3 TIMES DAILY  Refills: 0  STOP taking these medications.    amLODIPine 10 mg Tablet  Commonly known as: [place]          Discharge med list refreshed?  YES     Allergies   Allergen Reactions   . Cefaclor Rash   . Nitrofurantoin Rash   . Quinine Rash   . Sulfa (Sulfonamides) Rash   . Ciprofloxacin    . Dulaglutide    . Fluconazole    . Nitrofurantoin Macrocrystal    . Quinidine-Quinine Analogues ([place] Alkaloids)      HOSPITAL PROCEDURE(S):   No orders of the defined types were placed in this encounter.      Jordan Buckley is a 81 y.o., White female with history of hypertension, type 2 diabetes, hypothyroidism, DKA, dementia, CVA who presents with hyperglycemia.  Patient was having  elevated blood sugars.  She had some nausea and  abdominal pain associated with it.  She also admitted to having some dysuria.  No fever no chills or shortness of breath no vomiting or diarrhea.    In the emergency room her initial blood sugar was over 500.  She received IV fluids and also 10 units of regular insulin.  According to the EMR the patient's blood glucose remained elevated so she  received another 20 units of regular subcutaneous insulin and had a rapid drop in her glucose into the 40s.  She was then given 1 amp of dextrose along with fruit  juices soda and her glucose improved into the 60s and she was given another amp of dextrose and that her blood sugar shot up to 509.  Due to labile nature of blood sugars ED asked [place] to admit this patient.  Once admitted the patient was treated with Lantus sc BID and sliding scale insullin.  Over the course of her admission her insulin was adjusted and her blood glucose stablilized.  At the time of admission the patient's Lantus was 7units BID and she was on sliding scale insulin. There was initially a concern for possible UTI, but the patient's urinalysis was negative.  The patient did have a few episodes where her oxygen saturation dropped into the mid 80% range while sleeping.  It is recommended the patient be on supplemental oxygen at bedtime.  The patient was tolerating her diet without difficulty and on 01/20/22 the patient and her daughter agreed to discharge back to [facility]      Follow up with SNF PCP.     CONDITION ON DISCHARGE:  A. Ambulation: Ambulation with assistive device  B. Self-care Ability: Complete  C. Cognitive Status Alert and Oriented x 3  D. Code status at discharge:       LINES/DRAINS/WOUNDS AT DISCHARGE:   Patient Lines/Drains/Airways Status     Active Line / Dialysis Catheter / Dialysis Graft / Drain / Airway / Wound     Name Placement date Placement time Site Days    Peripheral IV Left Median Cubital  (antecubital fossa) 01/16/22  2040  -- 3    Peripheral IV Right Basilic  (medial side of arm) 01/17/22  2140  -- 2                DISCHARGE DISPOSITION:  Nursing Home  DISCHARGE INSTRUCTIONS:  Post-Discharge Follow Up Appointments     Thursday Aug 28, 2022    Return Patient Visit with Pia Mau, APRN,NP-C at 10:30 AM    [facility] [place],  [facility],  [place] [address]  [phone]        No discharge procedures on file.       Vale Haven, DO    Copies sent to Care Team       Relationship Specialty Notifications Start End    Lerry Paterson, [place] PCP - General PHYSICIAN ASSISTANT  10/29/21     Phone: [phone] Fax: [phone]         [address] [address]          Referring providers can utilize [email or web address] to access their referred [place] patient's information.

## 2022-01-22 NOTE — Care Management Notes (Signed)
Referral Information  ++++++ Placed Provider #1 ++++++  Case Manager: Ronald Hicks  Provider Type: Nursing Home/SNF-Return  Provider Name: [facility]  Address:  [address]  [place], [place] [address]  Contact:  Admissions  Phone: [phone] x  Fax:   Fax: [phone]

## 2022-01-27 ENCOUNTER — Encounter (HOSPITAL_COMMUNITY): Payer: Self-pay | Admitting: Physician Assistant

## 2022-02-18 ENCOUNTER — Other Ambulatory Visit: Payer: Medicare Other | Attending: FAMILY PRACTICE | Admitting: FAMILY PRACTICE

## 2022-02-18 DIAGNOSIS — N390 Urinary tract infection, site not specified: Secondary | ICD-10-CM | POA: Insufficient documentation

## 2022-02-20 LAB — URINE CULTURE: URINE CULTURE: >100000 — AB

## 2022-03-08 ENCOUNTER — Other Ambulatory Visit: Payer: Medicare Other | Attending: FAMILY PRACTICE | Admitting: FAMILY PRACTICE

## 2022-03-08 DIAGNOSIS — Z79899 Other long term (current) drug therapy: Secondary | ICD-10-CM

## 2022-03-08 LAB — CBC WITH DIFF
BASOPHIL #: 0 10*3/uL (ref 0.00–0.30)
BASOPHIL %: 1 % (ref 0–3)
EOSINOPHIL #: 0.1 10*3/uL (ref 0.00–0.80)
EOSINOPHIL %: 2 % (ref 0–7)
HCT: 38.7 % (ref 37.0–47.0)
HGB: 12.9 g/dL (ref 12.5–16.0)
LYMPHOCYTE #: 1.3 10*3/uL (ref 1.10–5.00)
LYMPHOCYTE %: 30 % (ref 25–45)
MCH: 31 pg (ref 27.0–32.0)
MCHC: 33.2 g/dL (ref 32.0–36.0)
MCV: 93.3 fL (ref 78.0–99.0)
MONOCYTE #: 0.2 10*3/uL (ref 0.00–1.30)
MONOCYTE %: 5 % (ref 0–12)
MPV: 8.2 fL (ref 7.4–10.4)
NEUTROPHIL #: 2.6 10*3/uL (ref 1.80–8.40)
NEUTROPHIL %: 62 % (ref 40–76)
PLATELETS: 223 10*3/uL (ref 140–440)
RBC: 4.15 10*6/uL — ABNORMAL LOW (ref 4.20–5.40)
RDW: 14.4 % (ref 11.6–14.8)
WBC: 4.3 10*3/uL (ref 4.0–10.5)
WBCS UNCORRECTED: 4.3 10*3/uL

## 2022-03-08 LAB — URINALYSIS, MACROSCOPIC
BILIRUBIN: NEGATIVE mg/dL
BLOOD: NEGATIVE mg/dL
GLUCOSE: >1000 mg/dL — AB
KETONES: 10 mg/dL — AB
LEUKOCYTES: NEGATIVE WBCs/uL
NITRITE: NEGATIVE
PH: 7 (ref 5.0–9.0)
PROTEIN: NEGATIVE mg/dL
SPECIFIC GRAVITY: 1.018 (ref 1.002–1.030)
UROBILINOGEN: NORMAL mg/dL

## 2022-03-08 LAB — URINALYSIS, MICROSCOPIC
RBCS: 1 /hpf (ref <4)
SQUAMOUS EPITHELIAL: <1 /hpf (ref <28)
WBCS: 1 /hpf (ref <6)

## 2022-03-08 LAB — COMPREHENSIVE METABOLIC PANEL, NON-FASTING
ALBUMIN/GLOBULIN RATIO: 1.6 — ABNORMAL HIGH (ref 0.8–1.4)
ALBUMIN: 4 g/dL (ref 3.5–5.7)
ALKALINE PHOSPHATASE: 79 U/L (ref 34–104)
ALT (SGPT): 7 U/L (ref 7–52)
ANION GAP: 10 mmol/L (ref 10–20)
AST (SGOT): 13 U/L (ref 13–39)
BILIRUBIN TOTAL: 1.3 mg/dL — ABNORMAL HIGH (ref 0.3–1.2)
BUN/CREA RATIO: 27 — ABNORMAL HIGH (ref 6–22)
BUN: 24 mg/dL (ref 7–25)
CALCIUM, CORRECTED: 9.4 mg/dL (ref 8.9–10.8)
CALCIUM: 9.4 mg/dL (ref 8.6–10.3)
CHLORIDE: 94 mmol/L — ABNORMAL LOW (ref 98–107)
CO2 TOTAL: 28 mmol/L (ref 21–31)
CREATININE: 0.89 mg/dL (ref 0.60–1.30)
ESTIMATED GFR: 65 mL/min/{1.73_m2} (ref >59)
GLOBULIN: 2.5 — ABNORMAL LOW (ref 2.9–5.4)
GLUCOSE: 468 mg/dL — ABNORMAL HIGH (ref 74–109)
OSMOLALITY, CALCULATED: 289 mOsm/kg (ref 270–290)
POTASSIUM: 4.8 mmol/L (ref 3.5–5.1)
PROTEIN TOTAL: 6.5 g/dL (ref 6.4–8.9)
SODIUM: 132 mmol/L — ABNORMAL LOW (ref 136–145)

## 2022-03-09 ENCOUNTER — Other Ambulatory Visit: Payer: Self-pay

## 2022-03-13 ENCOUNTER — Emergency Department (HOSPITAL_COMMUNITY): Payer: Medicare Other

## 2022-03-13 ENCOUNTER — Emergency Department
Admission: EM | Admit: 2022-03-13 | Discharge: 2022-03-13 | Disposition: A | Discharge status: Nursing Home (Includes ICF) | Payer: Medicare Other | Attending: Physician Assistant | Admitting: Physician Assistant

## 2022-03-13 ENCOUNTER — Other Ambulatory Visit: Payer: Self-pay

## 2022-03-13 ENCOUNTER — Encounter (HOSPITAL_COMMUNITY): Payer: Self-pay

## 2022-03-13 DIAGNOSIS — Z86718 Personal history of other venous thrombosis and embolism: Secondary | ICD-10-CM | POA: Insufficient documentation

## 2022-03-13 DIAGNOSIS — R238 Other skin changes: Secondary | ICD-10-CM

## 2022-03-13 DIAGNOSIS — L0390 Cellulitis, unspecified: Secondary | ICD-10-CM

## 2022-03-13 DIAGNOSIS — L03113 Cellulitis of right upper limb: Secondary | ICD-10-CM | POA: Insufficient documentation

## 2022-03-13 DIAGNOSIS — R2231 Localized swelling, mass and lump, right upper limb: Secondary | ICD-10-CM

## 2022-03-13 DIAGNOSIS — R739 Hyperglycemia, unspecified: Secondary | ICD-10-CM | POA: Insufficient documentation

## 2022-03-13 DIAGNOSIS — Z7901 Long term (current) use of anticoagulants: Secondary | ICD-10-CM

## 2022-03-13 HISTORY — DX: Chronic obstructive pulmonary disease, unspecified: J44.9

## 2022-03-13 HISTORY — DX: Unspecified asthma, uncomplicated: J45.909

## 2022-03-13 LAB — COMPREHENSIVE METABOLIC PANEL, NON-FASTING
ALBUMIN/GLOBULIN RATIO: 1.5 — ABNORMAL HIGH (ref 0.8–1.4)
ALBUMIN: 3.7 g/dL (ref 3.5–5.7)
ALKALINE PHOSPHATASE: 66 U/L (ref 34–104)
ALT (SGPT): 8 U/L (ref 7–52)
ANION GAP: 10 mmol/L (ref 10–20)
AST (SGOT): 13 U/L (ref 13–39)
BILIRUBIN TOTAL: 0.8 mg/dL (ref 0.3–1.2)
BUN/CREA RATIO: 21 (ref 6–22)
BUN: 20 mg/dL (ref 7–25)
CALCIUM, CORRECTED: 9.3 mg/dL (ref 8.9–10.8)
CALCIUM: 9 mg/dL (ref 8.6–10.3)
CHLORIDE: 96 mmol/L — ABNORMAL LOW (ref 98–107)
CO2 TOTAL: 25 mmol/L (ref 21–31)
CREATININE: 0.96 mg/dL (ref 0.60–1.30)
ESTIMATED GFR: 59 mL/min/{1.73_m2} — ABNORMAL LOW (ref >59)
GLOBULIN: 2.5 — ABNORMAL LOW (ref 2.9–5.4)
GLUCOSE: 500 mg/dL — ABNORMAL HIGH (ref 74–109)
OSMOLALITY, CALCULATED: 288 mOsm/kg (ref 270–290)
POTASSIUM: 5 mmol/L (ref 3.5–5.1)
PROTEIN TOTAL: 6.2 g/dL — ABNORMAL LOW (ref 6.4–8.9)
SODIUM: 131 mmol/L — ABNORMAL LOW (ref 136–145)

## 2022-03-13 LAB — CBC WITH DIFF
BASOPHIL #: 0.1 10*3/uL (ref 0.00–0.30)
BASOPHIL %: 1 % (ref 0–3)
EOSINOPHIL #: 0.1 10*3/uL (ref 0.00–0.80)
EOSINOPHIL %: 2 % (ref 0–7)
HCT: 34.3 % — ABNORMAL LOW (ref 37.0–47.0)
HGB: 11.4 g/dL — ABNORMAL LOW (ref 12.5–16.0)
LYMPHOCYTE #: 1.1 10*3/uL (ref 1.10–5.00)
LYMPHOCYTE %: 26 % (ref 25–45)
MCH: 31.5 pg (ref 27.0–32.0)
MCHC: 33.2 g/dL (ref 32.0–36.0)
MCV: 94.8 fL (ref 78.0–99.0)
MONOCYTE #: 0.3 10*3/uL (ref 0.00–1.30)
MONOCYTE %: 7 % (ref 0–12)
MPV: 6.8 fL — ABNORMAL LOW (ref 7.4–10.4)
NEUTROPHIL #: 2.8 10*3/uL (ref 1.80–8.40)
NEUTROPHIL %: 64 % (ref 40–76)
PLATELETS: 249 10*3/uL (ref 140–440)
RBC: 3.62 10*6/uL — ABNORMAL LOW (ref 4.20–5.40)
RDW: 14.7 % (ref 11.6–14.8)
WBC: 4.4 10*3/uL (ref 4.0–10.5)
WBCS UNCORRECTED: 4.4 10*3/uL

## 2022-03-13 LAB — PTT (PARTIAL THROMBOPLASTIN TIME): APTT: 37 seconds — ABNORMAL HIGH (ref 26.0–36.0)

## 2022-03-13 LAB — PT/INR
INR: 1.02 (ref <=5.00)
PROTHROMBIN TIME: 11.8 seconds (ref 9.8–12.7)

## 2022-03-13 MED ORDER — INSULIN U-100 REGULAR HUMAN 100 UNIT/ML INJECTION SOLUTION
INTRAMUSCULAR | Status: AC
Start: 2022-03-13 — End: 2022-03-13
  Filled 2022-03-13: qty 30

## 2022-03-13 MED ORDER — DOXYCYCLINE HYCLATE 100 MG CAPSULE
100.0000 mg | ORAL_CAPSULE | Freq: Two times a day (BID) | ORAL | 0 refills | Status: AC
Start: 2022-03-13 — End: 2022-03-23

## 2022-03-13 MED ORDER — INSULIN REGULAR HUMAN 100 UNIT/ML INJECTION - CHARGE BY DOSE
10.0000 [IU] | INTRAMUSCULAR | Status: AC
Start: 2022-03-13 — End: 2022-03-13
  Administered 2022-03-13: 10 [IU] via SUBCUTANEOUS

## 2022-03-13 NOTE — ED Nurses Note (Signed)
Patient reports skin abnormality appearing 2 days ago, pain has worsened. Skin appears red/purple to right upper arm with swelling. Denies injury/fall. Pain 8/10, burning sensation. Daughter at bedside

## 2022-03-13 NOTE — ED Triage Notes (Addendum)
EMS called to [facility] for Right arm pain. Sent from NH for r/t emboli. Right arm is warm to the touch with redness noted by EMS. BS 383.

## 2022-03-13 NOTE — Discharge Instructions (Addendum)
Ultrasound was negative for blood clot.  Patient was started on antibiotics for possible cellulitis.  Her blood glucose was elevated here 500 she was given 10 units of insulin based on her sliding scale at the nursing home.  Follow up with primary care.  If new or worsening symptoms return to the emergency department

## 2022-03-13 NOTE — ED Provider Notes (Signed)
[facility]  ED Primary Provider Note  History of Present Illness   Chief Complaint   Patient presents with    Arm Pain     Right     Jordan Buckley is a 81 y.o. female who had concerns including Arm Pain.  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  States she woke up that way.  States she does have some pain in the area.  She is anticoagulated on Eliquis currently.  Denies chest pain shortness of breath lower extremity swelling or pain denies any injury fever chills nausea vomiting    Review of Systems   Pertinent positive and negative ROS as per HPI.  Historical Data   History Reviewed This Encounter: Medical History  Surgical History  Family History  Social History    Physical Exam   ED Triage Vitals [03/13/22 1951]   BP (Non-Invasive) 126/72   Heart Rate 92   Respiratory Rate 20   Temperature 36.6 C (97.8 F)   SpO2 97 %   Weight 55.8 kg (123 lb)   Height 1.651 m (5' 5")     Physical Exam  Constitutional:       Appearance: Normal appearance.   HENT:      Nose: Nose normal.      Mouth/Throat:      Mouth: Mucous membranes are moist.   Eyes:      Extraocular Movements: Extraocular movements intact.      Pupils: Pupils are equal, round, and reactive to light.   Cardiovascular:      Rate and Rhythm: Normal rate and regular rhythm.      Pulses: Normal pulses.      Heart sounds: Normal heart sounds.   Pulmonary:      Effort: Pulmonary effort is normal.      Breath sounds: Normal breath sounds.   Abdominal:      General: Abdomen is flat.      Palpations: Abdomen is soft.   Musculoskeletal:         General: Normal range of motion.   Skin:     Comments: Focal redness over the lateral distal aspect of the right upper arm there is some mild increased warmth.  No induration neurovascularly intact distally no edema    Neurological:      Mental Status: She is alert.     Patient Data     Labs Ordered/Reviewed   COMPREHENSIVE METABOLIC PANEL, NON-FASTING - Abnormal; Notable for the following components:       Result Value    SODIUM 131 (*)     CHLORIDE 96 (*)     ESTIMATED GFR 59 (*)     GLUCOSE 500 (*)     PROTEIN TOTAL 6.2 (*)     ALBUMIN/GLOBULIN RATIO 1.5 (*)     GLOBULIN 2.5 (*)     All other components within normal limits    Narrative:     Estimated Glomerular Filtration Rate (eGFR) is calculated using the CKD-EPI (2021) equation, intended for patients 61 years of age and older. If gender is not documented or "unknown", there will be no eGFR calculation.   PTT (PARTIAL THROMBOPLASTIN TIME) - Abnormal; Notable for the following components:    APTT 37.0 (*)     All other components within normal limits   CBC  WITH DIFF - Abnormal; Notable for the following components:    RBC 3.62 (*)     HGB 11.4 (*)     HCT 34.3 (*)     MPV 6.8 (*)     All other components within normal limits   PT/INR - Normal    Narrative:     INR OF 2.0-3.0  RECOMMENDED FOR: PROPHYLAXIS/TREATMENT OF VENEOUS THROMBOSIS, PULMONARY EMBOLISM, PREVENTION OF SYSTEMIC EMBOLISM FROM ATRIAL FIBRILATION, MYOCARDIAL INFARCTION.    INR OF 2.5-3.5  RECOMMENDED FOR MECHANICAL PROSTHETIC HEART VALVES, RECURRENT SYSTEMIC EMBOLISM, RECURRENT MYOCARDIAL INFARCTION.     CBC/DIFF    Narrative:     The following orders were created for panel order CBC/DIFF.  Procedure                               Abnormality         Status                     ---------                               -----------         ------                     CBC WITH WVPX[[id number]]                Abnormal            Final result                 Please view results for these tests on the individual orders.     No orders to display     Medical Decision Making        Medical Decision Making  Venous duplex of the right upper extremity was negative for any thrombus she does have a red warm area will start her  on doxycycline.  Her blood glucose was elevated 500 appears to be chronic for the patient she was given her nighttime dose of subcutaneous insulin based on sliding scale from the nursing home 10 units.  She will follow up with the primary care    Amount and/or Complexity of Data Reviewed  Labs: ordered.    Risk  OTC drugs.             Medications Administered in the ED   insulin R human 100 units/mL injection (has no administration in time range)     Clinical Impression   Redness and swelling of upper arm   Cellulitis, unspecified cellulitis site (Primary)       Disposition: Discharged

## 2022-04-03 ENCOUNTER — Other Ambulatory Visit: Payer: Medicare Other | Attending: FAMILY PRACTICE | Admitting: FAMILY PRACTICE

## 2022-04-03 DIAGNOSIS — Z79899 Other long term (current) drug therapy: Secondary | ICD-10-CM

## 2022-04-03 LAB — COMPREHENSIVE METABOLIC PANEL, NON-FASTING
ALBUMIN/GLOBULIN RATIO: 1.6 — ABNORMAL HIGH (ref 0.8–1.4)
ALBUMIN: 3.6 g/dL (ref 3.5–5.7)
ALKALINE PHOSPHATASE: 48 U/L (ref 34–104)
ALT (SGPT): 7 U/L (ref 7–52)
ANION GAP: 10 mmol/L (ref 4–13)
AST (SGOT): 17 U/L (ref 13–39)
BILIRUBIN TOTAL: 0.4 mg/dL (ref 0.3–1.2)
BUN/CREA RATIO: 21 (ref 6–22)
BUN: 17 mg/dL (ref 7–25)
CALCIUM, CORRECTED: 9.4 mg/dL (ref 8.9–10.8)
CALCIUM: 9 mg/dL (ref 8.6–10.3)
CHLORIDE: 98 mmol/L (ref 98–107)
CO2 TOTAL: 25 mmol/L (ref 21–31)
CREATININE: 0.81 mg/dL (ref 0.60–1.30)
ESTIMATED GFR: 73 mL/min/{1.73_m2} (ref >59)
GLOBULIN: 2.3 — ABNORMAL LOW (ref 2.9–5.4)
GLUCOSE: 49 mg/dL — CL (ref 74–109)
OSMOLALITY, CALCULATED: 265 mOsm/kg — ABNORMAL LOW (ref 270–290)
POTASSIUM: 4.1 mmol/L (ref 3.5–5.1)
PROTEIN TOTAL: 5.9 g/dL — ABNORMAL LOW (ref 6.4–8.9)
SODIUM: 133 mmol/L — ABNORMAL LOW (ref 136–145)

## 2022-04-07 ENCOUNTER — Other Ambulatory Visit: Payer: Self-pay

## 2022-05-13 ENCOUNTER — Other Ambulatory Visit: Payer: Self-pay

## 2022-05-13 ENCOUNTER — Ambulatory Visit (INDEPENDENT_AMBULATORY_CARE_PROVIDER_SITE_OTHER): Payer: Medicare Other | Admitting: NURSE PRACTITIONER

## 2022-05-13 ENCOUNTER — Encounter (INDEPENDENT_AMBULATORY_CARE_PROVIDER_SITE_OTHER): Payer: Self-pay | Admitting: NURSE PRACTITIONER

## 2022-05-13 VITALS — Ht 64.0 in | Wt 125.0 lb

## 2022-05-13 DIAGNOSIS — H9191 Unspecified hearing loss, right ear: Secondary | ICD-10-CM

## 2022-05-13 DIAGNOSIS — H6123 Impacted cerumen, bilateral: Secondary | ICD-10-CM

## 2022-05-13 NOTE — Progress Notes (Signed)
ENT, [place]  [place] [address]  Phone: [phone]  Fax: [phone]      Encounter Date: 05/13/2022    Patient ID: Jordan Buckley  MRN: [id number]    DOB: Dec 09, 1940  Age: 81 y.o. female     Progress Note       Referring Provider:  No ref. provider found    Reason for Visit:   Chief Complaint   Patient presents with    Ear Problem(s)     Pt complains of hearing loss R ear after falling into fridge handle        History of Present Illness:  Jordan Buckley is a 81 y.o. female had a stroke in May and fell into a door handle and hit the right ear.  She complains of hearing loss right ear since then.  She has hearing aids but is not wearing the one for the right ear since injury.      Patient History:  Patient Active Problem List   Diagnosis    Osteoarthrosis    Osteoporosis    Chest pain    Back arthralgia    Weakness    Dementia (CMS HCC)    Anticoagulant long-term use    Closed compression fracture of L1 vertebra (CMS HCC)    Diabetes mellitus, type 2 (CMS HCC)    Hyperglycemia    Abnormal posture    Age-related osteoporosis without current pathological fracture    Anemia, unspecified    Atherosclerotic heart disease of native coronary artery without angina pectoris    Dysphagia, oral phase    Hyperlipidemia, unspecified    Hypothyroidism, unspecified    Hypothyroidism    Muscle weakness (generalized)    Need for assistance with personal care    Other abnormalities of gait and mobility    Vitamin D deficiency    Type 2 diabetes mellitus with hyperglycemia (CMS HCC)    Other lack of coordination    Wedge compression fracture of first lumbar vertebra, subsequent encounter for fracture with routine healing    Vertebrogenic low back pain    Alzheimer's disease, unspecified (CODE) (CMS HCC)    Unspecified dementia, unspecified severity, without behavioral disturbance, psychotic disturbance, mood disturbance, and anxiety (CMS HCC)    Diabetes mellitus (CMS HCC)     Current Outpatient  Medications   Medication Sig    apixaban (ELIQUIS) 2.5 mg Oral Tablet Take 2 Tablets (5 mg total) by mouth Twice daily    atorvastatin (LIPITOR) 20 mg Oral Tablet Take 1 Tablet (20 mg total) by mouth Every evening    busPIRone (BUSPAR) 5 mg Oral Tablet     docusate sodium (COLACE) 100 mg Oral Capsule Take 1 Capsule (100 mg total) by mouth Once a day    ergocalciferol, vitamin D2, (DRISDOL) 1,250 mcg (50,000 unit) Oral Capsule Vitamin D2 1,250 mcg (50,000 unit) capsule   Take by oral route.    escitalopram oxalate (LEXAPRO) 5 mg Oral Tablet     ferrous sulfate (FEOSOL) 325 mg (65 mg iron) Oral Tablet Take 1 Tablet (325 mg total) by mouth Once a day    insulin glargine 100 unit/mL Subcutaneous injection Inject 7 Units under the skin Every 12 hours for 30 days    insulin R human (HUMULIN R) 100 units/mL Subcutaneous Injectable Inject 0-12 Units under the skin Three times daily before meals    JANUVIA 100 mg Oral Tablet     levothyroxine (SYNTHROID) 75 mcg Oral Tablet Take 1 Tablet (  75 mcg total) by mouth Every morning    magnesium oxide (MAG-OX) 400 mg Oral Tablet Take 1 Tablet (400 mg total) by mouth Twice daily    MetFORMIN (GLUCOPHAGE) 1,000 mg Oral Tablet Take 1 Tablet (1,000 mg total) by mouth Twice daily with food    NOVOLOG U-100 INSULIN ASPART 100 unit/mL Subcutaneous Solution     ondansetron (ZOFRAN ODT) 4 mg Oral Tablet, Rapid Dissolve Take 1 Tablet (4 mg total) by mouth Every 8 hours as needed for Nausea/Vomiting    pramipexole (MIRAPEX) 0.125 mg Oral Tablet Take 1 Tablet (0.125 mg total) by mouth Three times a day      Allergies   Allergen Reactions    Cefaclor Rash    Nitrofurantoin Rash    Quinine Rash    Sulfa (Sulfonamides) Rash    Ciprofloxacin     Dulaglutide     Fluconazole     Nitrofurantoin Macrocrystal     Quinidine-Quinine Analogues ([place] Alkaloids)      Past Medical History:   Diagnosis Date    Alzheimer's dementia (CMS HCC)     Arthropathy     Asthma     Cancer (CMS HCC)     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)     Coronary artery disease     CVA (cerebrovascular accident) (CMS HCC)     Diabetes mellitus, type 2 (CMS HCC)     Hard of hearing     HTN (hypertension)     Osteoporosis     Thyroid disease     Wears glasses      Past Surgical History:   Procedure Laterality Date    FIXATION KYPHOPLASTY Right 11/27/2021    HX BACK SURGERY      HX CATARACT REMOVAL      HX CESAREAN SECTION      HX CHOLECYSTECTOMY      HX CORONARY ARTERY BYPASS GRAFT      HX HYSTERECTOMY      KIDNEY SURGERY Left     cancerous tumor removed    LIVER RESECTION      WRIST SURGERY Left      Family Medical History:       Problem Relation (Age of Onset)    No Known Problems Mother, Father            Social History     Tobacco Use    Smoking status: Never    Smokeless tobacco: Never   Vaping Use    Vaping Use: Never used   Substance Use Topics    Alcohol use: Never    Drug use: Never       Review of Systems     Vitals:    05/13/22 0959   Weight: 56.7 kg (125 lb)   Height: 1.626 m ('5\' 4"'$ )   BMI: 21.5      ENT Physical Exam  Constitutional  Appearance: patient appears well-developed, well-nourished and well-groomed,  Communication/Voice: communication appropriate for developmental age; vocal quality normal;  Head and Face  Appearance: head appears normal, face appears normal and face appears atraumatic;  Palpation: facial palpation normal;  Salivary: glands normal;  Ear  Hearing: intact;  Auricles: right auricle normal; left auricle normal;  External Mastoids: right external mastoid normal; left external mastoid normal;  Ear Canals: bilateral ear canals impacted cerumen observed;  Tympanic Membranes: right tympanic membrane normal; left tympanic membrane normal;  Nose  External Nose: nares patent bilaterally; external nose normal;  Internal Nose: nasal  mucosa normal; septum normal; bilateral inferior turbinates normal;  Oral Cavity/Oropharynx  Lips: normal;  Teeth: normal;  Gums: gingiva normal;  Tongue: normal;  Oral  mucosa: normal;  Hard palate: normal;  Neck  Neck: neck normal; neck palpation normal;  Thyroid: thyroid normal;  Respiratory  Inspection: breathing unlabored; normal breathing rate;  Lymphatic  Palpation: lymph nodes normal;  Neurovestibular  Mental Status: alert and oriented;  Psychiatric: mood normal; affect is appropriate;  Cranial Nerves: cranial nerves intact;       Assessment:  ENCOUNTER DIAGNOSES     ICD-10-CM   1. Hearing loss of right ear, unspecified hearing loss type  H91.91   2. Bilateral impacted cerumen  H61.23       Plan:  Medical records reviewed on 05/13/2022.  Cerumen removed AU  Audiogram scheduled.    Records release signed to obtain previous audio from [facility] to compare to newly ordered audiogram.  Orders Placed This Encounter    [phone] - REMOVAL IMPACTED CERUMEN W/ INSTRUMENT, UNILATERAL (AMB ONLY-PD)    AMB PRN REFERRAL EXTERNAL AUDIOLOGIST     No follow-ups on file.    Emiliano Dyer, FNP-BC  05/13/2022, 10:18

## 2022-05-13 NOTE — Procedures (Signed)
ENT, [place]  [place] [id number]    Procedure Note    Name: Jordan Buckley MRN:  [id number]   Date: 05/13/2022 Age: 81 y.o.  DOB:   02-21-1941       15183 - REMOVAL IMPACTED CERUMEN W/ INSTRUMENT, UNILATERAL (AMB ONLY-PD)    Performed by: Emiliano Dyer, FNP-BC  Authorized by: Emiliano Dyer, FNP-BC    Time Out:     Immediately before the procedure, a time out was called:  Yes    Patient verified:  Yes    Procedure Verified:  Yes    Site Verified:  Yes  Documentation:      Procedure: Cerumen cleaning   Pre-op Dx: Cerumen impaction   Bilateral EACs examined under binocular microscopy. Cerumen and/or debris was cleaned from the canals using curettes, suction, and alligator forceps. Patient tolerated procedure well.            Emiliano Dyer, FNP-BC

## 2022-05-20 ENCOUNTER — Encounter (INDEPENDENT_AMBULATORY_CARE_PROVIDER_SITE_OTHER): Payer: Self-pay | Admitting: NURSE PRACTITIONER

## 2022-05-20 ENCOUNTER — Other Ambulatory Visit: Payer: Self-pay

## 2022-05-20 ENCOUNTER — Ambulatory Visit (INDEPENDENT_AMBULATORY_CARE_PROVIDER_SITE_OTHER): Payer: Medicare Other | Admitting: NURSE PRACTITIONER

## 2022-05-20 VITALS — Ht 64.0 in | Wt 125.0 lb

## 2022-05-20 DIAGNOSIS — H903 Sensorineural hearing loss, bilateral: Secondary | ICD-10-CM

## 2022-05-20 NOTE — Progress Notes (Signed)
[facility], [facility]  [address]  [place] [address]  Phone: [phone]  Fax: [phone]      Encounter Date: 05/20/2022    Patient ID: Jordan Buckley  MRN: [id number]    DOB: January 10, 1941  Age: 81 y.o. female     Progress Note       Referring Provider:  Lerry Paterson, PA-C    Reason for Visit:   Chief Complaint   Patient presents with    Follow-up After Testing     Rc after audio, no complaints        History of Present Illness:  Jordan Buckley is a 82 y.o. 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  Audiogram completed.      Patient History:  Patient Active Problem List   Diagnosis    Osteoarthrosis    Osteoporosis    Chest pain    Back arthralgia    Weakness    Dementia (CMS HCC)    Anticoagulant long-term use    Closed compression fracture of L1 vertebra (CMS HCC)    Diabetes mellitus, type 2 (CMS HCC)    Hyperglycemia    Abnormal posture    Age-related osteoporosis without current pathological fracture    Anemia, unspecified    Atherosclerotic heart disease of native coronary artery without angina pectoris    Dysphagia, oral phase    Hyperlipidemia, unspecified    Hypothyroidism, unspecified    Hypothyroidism    Muscle weakness (generalized)    Need for assistance with personal care    Other abnormalities of gait and mobility    Vitamin D deficiency    Type 2 diabetes mellitus with hyperglycemia (CMS HCC)    Other lack of coordination    Wedge compression fracture of first lumbar vertebra, subsequent encounter for fracture with routine healing    Vertebrogenic low back pain    Alzheimer's disease, unspecified (CODE) (CMS HCC)    Unspecified dementia, unspecified severity, without behavioral disturbance, psychotic disturbance, mood disturbance, and anxiety (CMS HCC)    Diabetes mellitus (CMS HCC)     Current Outpatient Medications   Medication Sig    apixaban (ELIQUIS) 2.5 mg Oral Tablet Take 2 Tablets (5 mg total) by mouth Twice daily    atorvastatin  (LIPITOR) 20 mg Oral Tablet Take 1 Tablet (20 mg total) by mouth Every evening    busPIRone (BUSPAR) 5 mg Oral Tablet     docusate sodium (COLACE) 100 mg Oral Capsule Take 1 Capsule (100 mg total) by mouth Once a day    ergocalciferol, vitamin D2, (DRISDOL) 1,250 mcg (50,000 unit) Oral Capsule Vitamin D2 1,250 mcg (50,000 unit) capsule   Take by oral route.    escitalopram oxalate (LEXAPRO) 5 mg Oral Tablet     ferrous sulfate (FEOSOL) 325 mg (65 mg iron) Oral Tablet Take 1 Tablet (325 mg total) by mouth Once a day    HYDROcodone-acetaminophen (NORCO) 5-325 mg Oral Tablet     insulin glargine 100 unit/mL Subcutaneous injection Inject 7 Units under the skin Every 12 hours for 30 days    insulin R human (HUMULIN R) 100 units/mL Subcutaneous Injectable Inject 0-12 Units under the skin Three times daily before meals    JANUVIA 100 mg Oral Tablet     levothyroxine (SYNTHROID) 75 mcg Oral Tablet Take 1 Tablet (75 mcg total) by mouth Every morning    magnesium oxide (MAG-OX) 400 mg Oral Tablet Take 1 Tablet (400  mg total) by mouth Twice daily    MetFORMIN (GLUCOPHAGE) 1,000 mg Oral Tablet Take 1 Tablet (1,000 mg total) by mouth Twice daily with food    NOVOLOG U-100 INSULIN ASPART 100 unit/mL Subcutaneous Solution     ondansetron (ZOFRAN ODT) 4 mg Oral Tablet, Rapid Dissolve Take 1 Tablet (4 mg total) by mouth Every 8 hours as needed for Nausea/Vomiting    pramipexole (MIRAPEX) 0.125 mg Oral Tablet Take 1 Tablet (0.125 mg total) by mouth Three times a day      Allergies   Allergen Reactions    Cefaclor Rash    Nitrofurantoin Rash    Quinine Rash    Sulfa (Sulfonamides) Rash    Ciprofloxacin     Dulaglutide     Fluconazole     Nitrofurantoin Macrocrystal     Quinidine-Quinine Analogues ([place] Alkaloids)      Past Medical History:   Diagnosis Date    Alzheimer's dementia (CMS HCC)     Arthropathy     Asthma     Cancer (CMS HCC)     COPD (chronic obstructive pulmonary disease) (CMS HCC)     Coronary artery disease     CVA  (cerebrovascular accident) (CMS [place])     Diabetes mellitus, type 2 (CMS HCC)     Hard of hearing     HTN (hypertension)     Osteoporosis     Thyroid disease     Wears glasses      Past Surgical History:   Procedure Laterality Date    FIXATION KYPHOPLASTY Right 11/27/2021    HX BACK SURGERY      HX CATARACT REMOVAL      HX CESAREAN SECTION      HX CHOLECYSTECTOMY      HX CORONARY ARTERY BYPASS GRAFT      HX HYSTERECTOMY      KIDNEY SURGERY Left     cancerous tumor removed    LIVER RESECTION      WRIST SURGERY Left      Family Medical History:       Problem Relation (Age of Onset)    No Known Problems Mother, Father            Social History     Tobacco Use    Smoking status: Never    Smokeless tobacco: Never   Vaping Use    Vaping Use: Never used   Substance Use Topics    Alcohol use: Never    Drug use: Never       Review of Systems     Vitals:    05/20/22 1032   Weight: 56.7 kg (125 lb)   Height: 1.626 m ('5\' 4"'$ )   BMI: 21.5      ENT Physical Exam  Constitutional  Appearance: patient appears well-developed, well-nourished and well-groomed,  Communication/Voice: communication appropriate for developmental age; vocal quality normal;  Head and Face  Appearance: head appears normal, face appears normal and face appears atraumatic;  Palpation: facial palpation normal;  Salivary: glands normal;  Ear  Hearing: impaired to conversational voice;  Auricles: right auricle normal; left auricle normal;  External Mastoids: right external mastoid normal; left external mastoid normal;  Ear Canals: right ear canal normal; left ear canal normal;  Tympanic Membranes: right tympanic membrane normal; left tympanic membrane normal;  Neurovestibular  Mental Status: alert and oriented;  Psychiatric: mood normal; affect is appropriate;       Assessment:  ENCOUNTER DIAGNOSES     ICD-10-CM  1. Sensorineural hearing loss (SNHL) of both ears  H90.3       Plan:  Medical records reviewed on 05/20/2022.  Reviewed audiogram from [facility] today.   Bilateral moderate to profound hearing loss AU with minimal asymmetry in the right ear at 250 Hz.  I recommend she follow up with her hearing provider for a hearing aid check and possible adjustments.      No orders of the defined types were placed in this encounter.    Return if symptoms worsen or fail to improve.    Emiliano Dyer, FNP-BC  05/20/2022, 10:44

## 2022-06-17 ENCOUNTER — Other Ambulatory Visit: Payer: Medicare Other | Attending: FAMILY PRACTICE | Admitting: FAMILY PRACTICE

## 2022-06-17 DIAGNOSIS — Z79899 Other long term (current) drug therapy: Secondary | ICD-10-CM | POA: Insufficient documentation

## 2022-06-17 LAB — COMPREHENSIVE METABOLIC PANEL, NON-FASTING
ALBUMIN/GLOBULIN RATIO: 1.2 (ref 0.8–1.4)
ALBUMIN: 3.8 g/dL (ref 3.5–5.7)
ALKALINE PHOSPHATASE: 59 U/L (ref 34–104)
ALT (SGPT): 11 U/L (ref 7–52)
ANION GAP: 10 mmol/L (ref 4–13)
AST (SGOT): 19 U/L (ref 13–39)
BILIRUBIN TOTAL: 0.9 mg/dL (ref 0.3–1.2)
BUN/CREA RATIO: 25 — ABNORMAL HIGH (ref 6–22)
BUN: 22 mg/dL (ref 7–25)
CALCIUM, CORRECTED: 9.4 mg/dL (ref 8.9–10.8)
CALCIUM: 9.2 mg/dL (ref 8.6–10.3)
CHLORIDE: 98 mmol/L (ref 98–107)
CO2 TOTAL: 24 mmol/L (ref 21–31)
CREATININE: 0.88 mg/dL (ref 0.60–1.30)
ESTIMATED GFR: 66 mL/min/{1.73_m2} (ref >59)
GLOBULIN: 3.1 (ref 2.9–5.4)
GLUCOSE: 212 mg/dL — ABNORMAL HIGH (ref 74–109)
OSMOLALITY, CALCULATED: 274 mOsm/kg (ref 270–290)
POTASSIUM: 4.7 mmol/L (ref 3.5–5.1)
PROTEIN TOTAL: 6.9 g/dL (ref 6.4–8.9)
SODIUM: 132 mmol/L — ABNORMAL LOW (ref 136–145)

## 2022-06-17 LAB — CBC
HCT: 36.7 % (ref 31.2–41.9)
HGB: 12.1 g/dL (ref 10.9–14.3)
MCH: 31 pg (ref 24.7–32.8)
MCHC: 32.9 g/dL (ref 32.3–35.6)
MCV: 94.3 fL (ref 75.5–95.3)
MPV: 8.3 fL (ref 7.9–10.8)
RBC: 3.89 10*6/uL (ref 3.63–4.92)
RDW: 13.4 % (ref 12.3–17.7)
WBC: 2.8 10*3/uL — ABNORMAL LOW (ref 3.8–11.8)

## 2022-06-17 LAB — HGA1C (HEMOGLOBIN A1C WITH EST AVG GLUCOSE): HEMOGLOBIN A1C: 9.7 % — ABNORMAL HIGH (ref 4.0–6.0)

## 2022-06-17 LAB — LIPID PANEL
CHOL/HDL RATIO: 1.7
CHOLESTEROL: 143 mg/dL (ref <200)
HDL CHOL: 84 mg/dL (ref 23–92)
LDL CALC: 43 mg/dL (ref 0–100)
TRIGLYCERIDES: 79 mg/dL (ref <=150)
VLDL CALC: 16 mg/dL (ref 0–50)

## 2022-06-17 LAB — THYROID STIMULATING HORMONE (SENSITIVE TSH): TSH: 2.272 u[IU]/mL (ref 0.450–5.330)

## 2022-06-17 LAB — MAGNESIUM: MAGNESIUM: 1.7 mg/dL — ABNORMAL LOW (ref 1.9–2.7)

## 2022-06-17 LAB — VITAMIN D 25 TOTAL: VITAMIN D: 37 ng/mL (ref 30–100)

## 2022-06-19 ENCOUNTER — Other Ambulatory Visit: Payer: Medicare Other | Attending: FAMILY PRACTICE | Admitting: FAMILY PRACTICE

## 2022-06-19 DIAGNOSIS — Z79899 Other long term (current) drug therapy: Secondary | ICD-10-CM | POA: Insufficient documentation

## 2022-06-19 LAB — CBC WITH DIFF
BASOPHIL #: 0.1 10*3/uL (ref 0.00–0.10)
BASOPHIL %: 1 % (ref 0–1)
EOSINOPHIL #: 0.1 10*3/uL (ref 0.00–0.50)
EOSINOPHIL %: 1 %
HCT: 36.3 % (ref 31.2–41.9)
HGB: 12 g/dL (ref 10.9–14.3)
LYMPHOCYTE #: 1 10*3/uL (ref 1.00–3.00)
LYMPHOCYTE %: 17 % (ref 16–44)
MCH: 31.4 pg (ref 24.7–32.8)
MCHC: 33.1 g/dL (ref 32.3–35.6)
MCV: 95.1 fL (ref 75.5–95.3)
MONOCYTE #: 0.3 10*3/uL (ref 0.30–1.00)
MONOCYTE %: 4 % — ABNORMAL LOW (ref 5–13)
MPV: 7.4 fL — ABNORMAL LOW (ref 7.9–10.8)
NEUTROPHIL #: 4.5 10*3/uL (ref 1.85–7.80)
NEUTROPHIL %: 76 % (ref 43–77)
PLATELETS: 216 10*3/uL (ref 140–440)
RBC: 3.82 10*6/uL (ref 3.63–4.92)
RDW: 13.7 % (ref 12.3–17.7)
WBC: 6 10*3/uL (ref 3.8–11.8)

## 2022-06-20 ENCOUNTER — Other Ambulatory Visit: Payer: Self-pay

## 2022-06-26 ENCOUNTER — Other Ambulatory Visit: Payer: Medicare Other | Attending: FAMILY PRACTICE | Admitting: FAMILY PRACTICE

## 2022-06-26 DIAGNOSIS — Z79899 Other long term (current) drug therapy: Secondary | ICD-10-CM | POA: Insufficient documentation

## 2022-06-26 LAB — MAGNESIUM: MAGNESIUM: 2.1 mg/dL (ref 1.9–2.7)

## 2022-06-26 LAB — COMPREHENSIVE METABOLIC PANEL, NON-FASTING
ALBUMIN/GLOBULIN RATIO: 1.3 (ref 0.8–1.4)
ALBUMIN: 4 g/dL (ref 3.5–5.7)
ALKALINE PHOSPHATASE: 59 U/L (ref 34–104)
ALT (SGPT): 12 U/L (ref 7–52)
ANION GAP: 8 mmol/L (ref 4–13)
AST (SGOT): 21 U/L (ref 13–39)
BILIRUBIN TOTAL: 0.6 mg/dL (ref 0.3–1.2)
BUN/CREA RATIO: 21 (ref 6–22)
BUN: 26 mg/dL — ABNORMAL HIGH (ref 7–25)
CALCIUM, CORRECTED: 9.3 mg/dL (ref 8.9–10.8)
CALCIUM: 9.3 mg/dL (ref 8.6–10.3)
CHLORIDE: 100 mmol/L (ref 98–107)
CO2 TOTAL: 28 mmol/L (ref 21–31)
CREATININE: 1.21 mg/dL (ref 0.60–1.30)
ESTIMATED GFR: 45 mL/min/{1.73_m2} — ABNORMAL LOW (ref >59)
GLOBULIN: 3.1 (ref 2.9–5.4)
GLUCOSE: 139 mg/dL — ABNORMAL HIGH (ref 74–109)
OSMOLALITY, CALCULATED: 279 mOsm/kg (ref 270–290)
POTASSIUM: 4.3 mmol/L (ref 3.5–5.1)
PROTEIN TOTAL: 7.1 g/dL (ref 6.4–8.9)
SODIUM: 136 mmol/L (ref 136–145)

## 2022-07-14 ENCOUNTER — Other Ambulatory Visit: Payer: Medicare Other | Attending: FAMILY PRACTICE | Admitting: FAMILY PRACTICE

## 2022-07-14 DIAGNOSIS — Z79899 Other long term (current) drug therapy: Secondary | ICD-10-CM | POA: Insufficient documentation

## 2022-07-14 LAB — COMPREHENSIVE METABOLIC PANEL, NON-FASTING
ALBUMIN/GLOBULIN RATIO: 1.4 (ref 0.8–1.4)
ALBUMIN: 4.2 g/dL (ref 3.5–5.7)
ALKALINE PHOSPHATASE: 74 U/L (ref 34–104)
ALT (SGPT): 14 U/L (ref 7–52)
ANION GAP: 7 mmol/L (ref 4–13)
AST (SGOT): 18 U/L (ref 13–39)
BILIRUBIN TOTAL: 1.1 mg/dL (ref 0.3–1.2)
BUN/CREA RATIO: 24 — ABNORMAL HIGH (ref 6–22)
BUN: 25 mg/dL (ref 7–25)
CALCIUM, CORRECTED: 9.4 mg/dL (ref 8.9–10.8)
CALCIUM: 9.6 mg/dL (ref 8.6–10.3)
CHLORIDE: 92 mmol/L — ABNORMAL LOW (ref 98–107)
CO2 TOTAL: 31 mmol/L (ref 21–31)
CREATININE: 1.06 mg/dL (ref 0.60–1.30)
ESTIMATED GFR: 53 mL/min/{1.73_m2} — ABNORMAL LOW (ref >59)
GLOBULIN: 3.1 (ref 2.9–5.4)
GLUCOSE: 391 mg/dL — ABNORMAL HIGH (ref 74–109)
OSMOLALITY, CALCULATED: 282 mOsm/kg (ref 270–290)
POTASSIUM: 4.8 mmol/L (ref 3.5–5.1)
PROTEIN TOTAL: 7.3 g/dL (ref 6.4–8.9)
SODIUM: 130 mmol/L — ABNORMAL LOW (ref 136–145)

## 2022-07-14 LAB — LIPID PANEL
CHOL/HDL RATIO: 2.1
CHOLESTEROL: 169 mg/dL (ref <200)
HDL CHOL: 82 mg/dL (ref 23–92)
LDL CALC: 58 mg/dL (ref 0–100)
TRIGLYCERIDES: 144 mg/dL (ref <=150)
VLDL CALC: 29 mg/dL (ref 0–50)

## 2022-07-14 LAB — VITAMIN D 25 TOTAL: VITAMIN D: 33 ng/mL (ref 30–100)

## 2022-07-14 LAB — HGA1C (HEMOGLOBIN A1C WITH EST AVG GLUCOSE): HEMOGLOBIN A1C: 10 % — ABNORMAL HIGH (ref 4.0–6.0)

## 2022-07-14 LAB — MAGNESIUM: MAGNESIUM: 1.9 mg/dL (ref 1.9–2.7)

## 2022-07-14 LAB — THYROID STIMULATING HORMONE (SENSITIVE TSH): TSH: 1.352 u[IU]/mL (ref 0.450–5.330)

## 2022-07-17 ENCOUNTER — Other Ambulatory Visit: Payer: Self-pay

## 2022-08-09 ENCOUNTER — Other Ambulatory Visit (HOSPITAL_COMMUNITY): Payer: Medicare Other | Admitting: FAMILY PRACTICE

## 2022-08-09 DIAGNOSIS — F0390 Unspecified dementia without behavioral disturbance: Secondary | ICD-10-CM

## 2022-08-09 DIAGNOSIS — E1110 Type 2 diabetes mellitus with ketoacidosis without coma: Secondary | ICD-10-CM

## 2022-08-09 DIAGNOSIS — Z79899 Other long term (current) drug therapy: Secondary | ICD-10-CM

## 2022-08-09 LAB — URINALYSIS, MACROSCOPIC
BILIRUBIN: NEGATIVE mg/dL
BLOOD: NEGATIVE mg/dL
GLUCOSE: 150 mg/dL — AB
KETONES: 10 mg/dL — AB
LEUKOCYTES: 75 WBCs/uL — AB
NITRITE: NEGATIVE
PH: 5 (ref 5.0–9.0)
PROTEIN: NEGATIVE mg/dL
SPECIFIC GRAVITY: 1.01 (ref 1.002–1.030)
UROBILINOGEN: NORMAL mg/dL

## 2022-08-09 LAB — URINALYSIS, MICROSCOPIC
BACTERIA: NEGATIVE /hpf
NON-SQUAMOUS EPITHELIAL CELLS URINE: <1 /hpf (ref <=1)
RBCS: 1 /hpf (ref <4)
SQUAMOUS EPITHELIAL: <1 /hpf (ref <28)
WBCS: 9 /hpf — ABNORMAL HIGH (ref <6)

## 2022-08-11 ENCOUNTER — Encounter (HOSPITAL_COMMUNITY): Payer: Self-pay

## 2022-08-11 ENCOUNTER — Inpatient Hospital Stay
Admission: EM | Admit: 2022-08-11 | Discharge: 2022-08-14 | DRG: 638 | Disposition: A | Discharge status: Skilled Nursing Facility | Payer: Medicare Other | Attending: Internal Medicine | Admitting: Internal Medicine

## 2022-08-11 ENCOUNTER — Emergency Department (HOSPITAL_COMMUNITY): Payer: Medicare Other

## 2022-08-11 ENCOUNTER — Other Ambulatory Visit: Payer: Self-pay

## 2022-08-11 ENCOUNTER — Inpatient Hospital Stay (HOSPITAL_COMMUNITY): Payer: Medicare Other | Admitting: Pulmonary Disease

## 2022-08-11 DIAGNOSIS — E785 Hyperlipidemia, unspecified: Secondary | ICD-10-CM | POA: Diagnosis present

## 2022-08-11 DIAGNOSIS — I440 Atrioventricular block, first degree: Secondary | ICD-10-CM

## 2022-08-11 DIAGNOSIS — N179 Acute kidney failure, unspecified: Secondary | ICD-10-CM | POA: Diagnosis present

## 2022-08-11 DIAGNOSIS — W19XXXA Unspecified fall, initial encounter: Principal | ICD-10-CM | POA: Diagnosis present

## 2022-08-11 DIAGNOSIS — F0280 Dementia in other diseases classified elsewhere without behavioral disturbance: Secondary | ICD-10-CM | POA: Diagnosis present

## 2022-08-11 DIAGNOSIS — Z7901 Long term (current) use of anticoagulants: Secondary | ICD-10-CM | POA: Diagnosis present

## 2022-08-11 DIAGNOSIS — N390 Urinary tract infection, site not specified: Secondary | ICD-10-CM | POA: Diagnosis present

## 2022-08-11 DIAGNOSIS — R000 Tachycardia, unspecified: Secondary | ICD-10-CM

## 2022-08-11 DIAGNOSIS — S0990XA Unspecified injury of head, initial encounter: Secondary | ICD-10-CM

## 2022-08-11 DIAGNOSIS — R7989 Other specified abnormal findings of blood chemistry: Secondary | ICD-10-CM | POA: Diagnosis present

## 2022-08-11 DIAGNOSIS — E1165 Type 2 diabetes mellitus with hyperglycemia: Secondary | ICD-10-CM

## 2022-08-11 DIAGNOSIS — Z79899 Other long term (current) drug therapy: Secondary | ICD-10-CM

## 2022-08-11 DIAGNOSIS — E11649 Type 2 diabetes mellitus with hypoglycemia without coma: Secondary | ICD-10-CM | POA: Diagnosis not present

## 2022-08-11 DIAGNOSIS — F0390 Unspecified dementia without behavioral disturbance: Secondary | ICD-10-CM | POA: Diagnosis present

## 2022-08-11 DIAGNOSIS — E1110 Type 2 diabetes mellitus with ketoacidosis without coma: Secondary | ICD-10-CM | POA: Diagnosis present

## 2022-08-11 DIAGNOSIS — B951 Streptococcus, group B, as the cause of diseases classified elsewhere: Secondary | ICD-10-CM | POA: Diagnosis present

## 2022-08-11 DIAGNOSIS — E875 Hyperkalemia: Secondary | ICD-10-CM | POA: Diagnosis present

## 2022-08-11 DIAGNOSIS — E039 Hypothyroidism, unspecified: Secondary | ICD-10-CM | POA: Diagnosis present

## 2022-08-11 DIAGNOSIS — I10 Essential (primary) hypertension: Secondary | ICD-10-CM | POA: Diagnosis present

## 2022-08-11 DIAGNOSIS — G309 Alzheimer's disease, unspecified: Secondary | ICD-10-CM | POA: Diagnosis present

## 2022-08-11 DIAGNOSIS — Z7984 Long term (current) use of oral hypoglycemic drugs: Secondary | ICD-10-CM

## 2022-08-11 DIAGNOSIS — Z8673 Personal history of transient ischemic attack (TIA), and cerebral infarction without residual deficits: Secondary | ICD-10-CM

## 2022-08-11 DIAGNOSIS — S0502XA Injury of conjunctiva and corneal abrasion without foreign body, left eye, initial encounter: Secondary | ICD-10-CM

## 2022-08-11 DIAGNOSIS — Z794 Long term (current) use of insulin: Secondary | ICD-10-CM

## 2022-08-11 DIAGNOSIS — R739 Hyperglycemia, unspecified: Secondary | ICD-10-CM

## 2022-08-11 DIAGNOSIS — E872 Acidosis, unspecified: Secondary | ICD-10-CM

## 2022-08-11 DIAGNOSIS — Z79890 Hormone replacement therapy (postmenopausal): Secondary | ICD-10-CM

## 2022-08-11 DIAGNOSIS — S00212A Abrasion of left eyelid and periocular area, initial encounter: Secondary | ICD-10-CM | POA: Diagnosis present

## 2022-08-11 DIAGNOSIS — R9431 Abnormal electrocardiogram [ECG] [EKG]: Secondary | ICD-10-CM

## 2022-08-11 DIAGNOSIS — J4489 Other specified chronic obstructive pulmonary disease: Secondary | ICD-10-CM | POA: Diagnosis present

## 2022-08-11 HISTORY — DX: Depression, unspecified: F32.A

## 2022-08-11 LAB — POC BLOOD GLUCOSE (RESULTS)
GLUCOSE, POC: >600 mg/dl (ref 50–500)
GLUCOSE, POC: >600 mg/dl (ref 50–500)
GLUCOSE, POC: >600 mg/dl (ref 50–500)
GLUCOSE, POC: >600 mg/dl (ref 50–500)
GLUCOSE, POC: 352 mg/dl (ref 50–500)
GLUCOSE, POC: 302 mg/dl (ref 50–500)
GLUCOSE, POC: 179 mg/dl (ref 50–500)
GLUCOSE, POC: 137 mg/dl (ref 50–500)
GLUCOSE, POC: 79 mg/dl (ref 50–500)
GLUCOSE, POC: 145 mg/dl (ref 50–500)

## 2022-08-11 LAB — VENOUS BLOOD GAS/LACTATE
%FIO2 (VENOUS): 21 %
%FIO2 (VENOUS): 21 %
%FIO2 (VENOUS): 21 %
%FIO2 (VENOUS): 21 %
BASE DEFICIT: 8.6 mmol/L — ABNORMAL HIGH (ref 0.0–2.0)
BASE DEFICIT: 10.4 mmol/L — ABNORMAL HIGH (ref 0.0–2.0)
BASE DEFICIT: 5.7 mmol/L — ABNORMAL HIGH (ref 0.0–2.0)
BASE EXCESS: 2.1 mmol/L — ABNORMAL HIGH (ref 0.0–2.0)
BICARBONATE (VENOUS): 17.4 mmol/L — ABNORMAL LOW (ref 22.0–26.0)
BICARBONATE (VENOUS): 19.5 mmol/L — ABNORMAL LOW (ref 22.0–26.0)
BICARBONATE (VENOUS): 25.7 mmol/L (ref 22.0–26.0)
CARBOXYHEMOGLOBIN: 3.4 % — ABNORMAL HIGH (ref <=1.5)
CARBOXYHEMOGLOBIN: 5.8 % — ABNORMAL HIGH (ref <=1.5)
CARBOXYHEMOGLOBIN: 3.7 % — ABNORMAL HIGH (ref <=1.5)
CARBOXYHEMOGLOBIN: 5.5 % — ABNORMAL HIGH (ref <=1.5)
LACTATE: 1.4 mmol/L (ref <=2.0)
LACTATE: 8.3 mmol/L (ref <=2.0)
LACTATE: 8.9 mmol/L (ref <=2.0)
LACTATE: 4.8 mmol/L (ref <=2.0)
MET-HEMOGLOBIN: 0.5 % (ref <=2.0)
MET-HEMOGLOBIN: 0.7 % (ref <=2.0)
MET-HEMOGLOBIN: 0.5 % (ref <=2.0)
MET-HEMOGLOBIN: 0.6 % (ref <=2.0)
O2CT: 15.8 %
O2CT: 12.8 %
O2CT: 14.9 %
OXYHEMOGLOBIN: 86.1 %
OXYHEMOGLOBIN: 93.5 %
OXYHEMOGLOBIN: 76.9 %
OXYHEMOGLOBIN: 92.6 %
PCO2 (VENOUS): 49 mm/Hg (ref 41–51)
PCO2 (VENOUS): 47 mm/Hg (ref 41–51)
PCO2 (VENOUS): 47 mm/Hg (ref 41–51)
PCO2 (VENOUS): 49 mm/Hg (ref 41–51)
PH (VENOUS): 7.21 — ABNORMAL LOW (ref 7.31–7.41)
PH (VENOUS): 7.19 — CL (ref 7.31–7.41)
PH (VENOUS): 7.27 — ABNORMAL LOW (ref 7.31–7.41)
PH (VENOUS): 7.36 (ref 7.31–7.41)
PO2 (VENOUS): 63 mm/Hg — ABNORMAL HIGH (ref 35–50)
PO2 (VENOUS): 192 mm/Hg — ABNORMAL HIGH (ref 35–50)
PO2 (VENOUS): 44 mm/Hg (ref 35–50)
PO2 (VENOUS): 94 mm/Hg — ABNORMAL HIGH (ref 35–50)

## 2022-08-11 LAB — CBC WITH DIFF
BASOPHIL #: 0 10*3/uL (ref 0.00–0.10)
BASOPHIL %: 0 % (ref 0–1)
EOSINOPHIL #: 0 10*3/uL (ref 0.00–0.50)
EOSINOPHIL %: 0 % — ABNORMAL LOW
HCT: 39.5 % (ref 31.2–41.9)
HGB: 12.7 g/dL (ref 10.9–14.3)
LYMPHOCYTE #: 0.8 10*3/uL — ABNORMAL LOW (ref 1.00–3.00)
LYMPHOCYTE %: 11 % — ABNORMAL LOW (ref 16–44)
MCH: 30.9 pg (ref 24.7–32.8)
MCHC: 32.1 g/dL — ABNORMAL LOW (ref 32.3–35.6)
MCV: 96.3 fL — ABNORMAL HIGH (ref 75.5–95.3)
MONOCYTE #: 0.2 10*3/uL — ABNORMAL LOW (ref 0.30–1.00)
MONOCYTE %: 2 % — ABNORMAL LOW (ref 5–13)
MPV: 7.7 fL — ABNORMAL LOW (ref 7.9–10.8)
NEUTROPHIL #: 6.1 10*3/uL (ref 1.85–7.80)
NEUTROPHIL %: 87 % — ABNORMAL HIGH (ref 43–77)
PLATELETS: 240 10*3/uL (ref 140–440)
RBC: 4.1 10*6/uL (ref 3.63–4.92)
RDW: 14.6 % (ref 12.3–17.7)
WBC: 7.1 10*3/uL (ref 3.8–11.8)

## 2022-08-11 LAB — BASIC METABOLIC PANEL
ANION GAP: 24 mmol/L — ABNORMAL HIGH (ref 4–13)
ANION GAP: 19 mmol/L — ABNORMAL HIGH (ref 4–13)
ANION GAP: 19 mmol/L — ABNORMAL HIGH (ref 4–13)
ANION GAP: 14 mmol/L — ABNORMAL HIGH (ref 4–13)
ANION GAP: 10 mmol/L (ref 4–13)
BUN/CREA RATIO: 25 — ABNORMAL HIGH (ref 6–22)
BUN/CREA RATIO: 23 — ABNORMAL HIGH (ref 6–22)
BUN/CREA RATIO: 22 (ref 6–22)
BUN/CREA RATIO: 22 (ref 6–22)
BUN/CREA RATIO: 24 — ABNORMAL HIGH (ref 6–22)
BUN: 35 mg/dL — ABNORMAL HIGH (ref 7–25)
BUN: 34 mg/dL — ABNORMAL HIGH (ref 7–25)
BUN: 34 mg/dL — ABNORMAL HIGH (ref 7–25)
BUN: 32 mg/dL — ABNORMAL HIGH (ref 7–25)
BUN: 30 mg/dL — ABNORMAL HIGH (ref 7–25)
CALCIUM: 9.3 mg/dL (ref 8.6–10.3)
CALCIUM: 9.2 mg/dL (ref 8.6–10.3)
CALCIUM: 9.2 mg/dL (ref 8.6–10.3)
CALCIUM: 9.5 mg/dL (ref 8.6–10.3)
CALCIUM: 9.2 mg/dL (ref 8.6–10.3)
CHLORIDE: 89 mmol/L — ABNORMAL LOW (ref 98–107)
CHLORIDE: 92 mmol/L — ABNORMAL LOW (ref 98–107)
CHLORIDE: 95 mmol/L — ABNORMAL LOW (ref 98–107)
CHLORIDE: 97 mmol/L — ABNORMAL LOW (ref 98–107)
CHLORIDE: 99 mmol/L (ref 98–107)
CO2 TOTAL: 13 mmol/L — ABNORMAL LOW (ref 21–31)
CO2 TOTAL: 19 mmol/L — ABNORMAL LOW (ref 21–31)
CO2 TOTAL: 17 mmol/L — ABNORMAL LOW (ref 21–31)
CO2 TOTAL: 21 mmol/L (ref 21–31)
CO2 TOTAL: 27 mmol/L (ref 21–31)
CREATININE: 1.4 mg/dL — ABNORMAL HIGH (ref 0.60–1.30)
CREATININE: 1.51 mg/dL — ABNORMAL HIGH (ref 0.60–1.30)
CREATININE: 1.55 mg/dL — ABNORMAL HIGH (ref 0.60–1.30)
CREATININE: 1.47 mg/dL — ABNORMAL HIGH (ref 0.60–1.30)
CREATININE: 1.23 mg/dL (ref 0.60–1.30)
ESTIMATED GFR: 38 mL/min/{1.73_m2} — ABNORMAL LOW (ref >59)
ESTIMATED GFR: 35 mL/min/{1.73_m2} — ABNORMAL LOW (ref >59)
ESTIMATED GFR: 33 mL/min/{1.73_m2} — ABNORMAL LOW (ref >59)
ESTIMATED GFR: 36 mL/min/{1.73_m2} — ABNORMAL LOW (ref >59)
ESTIMATED GFR: 44 mL/min/{1.73_m2} — ABNORMAL LOW (ref >59)
GLUCOSE: 847 mg/dL (ref 74–109)
GLUCOSE: 683 mg/dL (ref 74–109)
GLUCOSE: 578 mg/dL (ref 74–109)
GLUCOSE: 354 mg/dL — ABNORMAL HIGH (ref 74–109)
GLUCOSE: 110 mg/dL — ABNORMAL HIGH (ref 74–109)
OSMOLALITY, CALCULATED: 303 mOsm/kg — ABNORMAL HIGH (ref 270–290)
OSMOLALITY, CALCULATED: 301 mOsm/kg — ABNORMAL HIGH (ref 270–290)
OSMOLALITY, CALCULATED: 297 mOsm/kg — ABNORMAL HIGH (ref 270–290)
OSMOLALITY, CALCULATED: 286 mOsm/kg (ref 270–290)
OSMOLALITY, CALCULATED: 279 mOsm/kg (ref 270–290)
POTASSIUM: 6 mmol/L (ref 3.5–5.1)
POTASSIUM: 3.7 mmol/L (ref 3.5–5.1)
POTASSIUM: 3.7 mmol/L (ref 3.5–5.1)
POTASSIUM: 3.5 mmol/L (ref 3.5–5.1)
POTASSIUM: 3.9 mmol/L (ref 3.5–5.1)
SODIUM: 126 mmol/L — ABNORMAL LOW (ref 136–145)
SODIUM: 130 mmol/L — ABNORMAL LOW (ref 136–145)
SODIUM: 131 mmol/L — ABNORMAL LOW (ref 136–145)
SODIUM: 132 mmol/L — ABNORMAL LOW (ref 136–145)
SODIUM: 136 mmol/L (ref 136–145)

## 2022-08-11 LAB — LACTIC ACID - SECOND REFLEX: LACTIC ACID: 1.5 mmol/L (ref 0.5–2.2)

## 2022-08-11 LAB — TROPONIN-I: TROPONIN I: 10 ng/L (ref <15)

## 2022-08-11 LAB — URINALYSIS, MACROSCOPIC
BILIRUBIN: NEGATIVE mg/dL
BLOOD: NEGATIVE mg/dL
GLUCOSE: >1000 mg/dL — AB
KETONES: 80 mg/dL — AB
LEUKOCYTES: NEGATIVE WBCs/uL
NITRITE: NEGATIVE
PH: 5 (ref 5.0–9.0)
PROTEIN: NEGATIVE mg/dL
SPECIFIC GRAVITY: 1.023 (ref 1.002–1.030)
UROBILINOGEN: NORMAL mg/dL

## 2022-08-11 LAB — ECG 12 LEAD
Atrial Rate: 96 {beats}/min
Atrial Rate: 106 {beats}/min
Calculated P Axis: 80 degrees
Calculated P Axis: 64 degrees
Calculated R Axis: 99 degrees
Calculated R Axis: 102 degrees
Calculated T Axis: 22 degrees
Calculated T Axis: 256 degrees
PR Interval: 170 ms
PR Interval: 352 ms
QRS Duration: 90 ms
QRS Duration: 92 ms
QT Interval: 332 ms
QT Interval: 242 ms
QTC Calculation: 419 ms
QTC Calculation: 321 ms
Ventricular rate: 96 {beats}/min
Ventricular rate: 106 {beats}/min

## 2022-08-11 LAB — COMPREHENSIVE METABOLIC PANEL, NON-FASTING
ALBUMIN/GLOBULIN RATIO: 1.2 (ref 0.8–1.4)
ALBUMIN: 3.8 g/dL (ref 3.5–5.7)
ALKALINE PHOSPHATASE: 85 U/L (ref 34–104)
ALT (SGPT): 14 U/L (ref 7–52)
ANION GAP: 21 mmol/L — ABNORMAL HIGH (ref 4–13)
AST (SGOT): 15 U/L (ref 13–39)
BILIRUBIN TOTAL: 0.8 mg/dL (ref 0.3–1.2)
BUN/CREA RATIO: 25 — ABNORMAL HIGH (ref 6–22)
BUN: 36 mg/dL — ABNORMAL HIGH (ref 7–25)
CALCIUM, CORRECTED: 9.9 mg/dL (ref 8.9–10.8)
CALCIUM: 9.7 mg/dL (ref 8.6–10.3)
CHLORIDE: 85 mmol/L — ABNORMAL LOW (ref 98–107)
CO2 TOTAL: 18 mmol/L — ABNORMAL LOW (ref 21–31)
CREATININE: 1.45 mg/dL — ABNORMAL HIGH (ref 0.60–1.30)
ESTIMATED GFR: 36 mL/min/{1.73_m2} — ABNORMAL LOW (ref >59)
GLOBULIN: 3.2 (ref 2.9–5.4)
GLUCOSE: 881 mg/dL (ref 74–109)
OSMOLALITY, CALCULATED: 302 mOsm/kg — ABNORMAL HIGH (ref 270–290)
POTASSIUM: 6.1 mmol/L (ref 3.5–5.1)
PROTEIN TOTAL: 7 g/dL (ref 6.4–8.9)
SODIUM: 124 mmol/L — ABNORMAL LOW (ref 136–145)

## 2022-08-11 LAB — URINALYSIS, MICROSCOPIC
BACTERIA: NEGATIVE /hpf
RBCS: <1 /hpf (ref <4)
SQUAMOUS EPITHELIAL: <1 /hpf (ref <28)
WBCS: 1 /hpf (ref <6)

## 2022-08-11 LAB — LACTIC ACID - FIRST REFLEX
LACTIC ACID: 8.2 mmol/L (ref 0.5–2.2)
LACTIC ACID: 4.7 mmol/L (ref 0.5–2.2)

## 2022-08-11 LAB — MAGNESIUM: MAGNESIUM: 2.2 mg/dL (ref 1.9–2.7)

## 2022-08-11 LAB — PHOSPHORUS
PHOSPHORUS: 4.8 mg/dL (ref 3.7–7.2)
PHOSPHORUS: 3.2 mg/dL — ABNORMAL LOW (ref 3.7–7.2)

## 2022-08-11 LAB — URINE CULTURE,ROUTINE: URINE CULTURE: 20000 — AB

## 2022-08-11 LAB — LACTIC ACID LEVEL W/ REFLEX FOR LEVEL >2.0: LACTIC ACID: 1.1 mmol/L (ref 0.5–2.2)

## 2022-08-11 LAB — BETA-HYDROXYBUTYRATE, PLASMA OR SERUM: BETA-HYDROXYBUTYRATE: >8 mmol/L — ABNORMAL HIGH (ref 0.02–0.29)

## 2022-08-11 MED ORDER — MAGNESIUM OXIDE 400 MG (241.3 MG MAGNESIUM) TABLET
400.0000 mg | ORAL_TABLET | Freq: Three times a day (TID) | ORAL | Status: DC
Start: 2022-08-11 — End: 2022-08-14
  Administered 2022-08-11 – 2022-08-14 (×10): 400 mg via ORAL
  Filled 2022-08-11 (×10): qty 1

## 2022-08-11 MED ORDER — LEVOTHYROXINE 150 MCG TABLET
75.0000 ug | ORAL_TABLET | Freq: Every morning | ORAL | Status: DC
Start: 2022-08-11 — End: 2022-08-14
  Administered 2022-08-11 – 2022-08-14 (×4): 75 ug via ORAL
  Filled 2022-08-11 (×4): qty 1

## 2022-08-11 MED ORDER — SODIUM ZIRCONIUM CYCLOSILICATE 10 GRAM ORAL POWDER PACKET
10.0000 g | Freq: Three times a day (TID) | ORAL | Status: DC
Start: 2022-08-11 — End: 2022-08-11
  Administered 2022-08-11: 10 g via ORAL
  Filled 2022-08-11: qty 1

## 2022-08-11 MED ORDER — CITALOPRAM 20 MG TABLET
10.0000 mg | ORAL_TABLET | Freq: Every day | ORAL | Status: DC
Start: 2022-08-11 — End: 2022-08-14
  Administered 2022-08-11 – 2022-08-14 (×4): 10 mg via ORAL
  Filled 2022-08-11 (×4): qty 1

## 2022-08-11 MED ORDER — SODIUM CHLORIDE 0.9 % IV BOLUS
500.0000 mL | INJECTION | Status: AC
Start: 2022-08-11 — End: 2022-08-11
  Administered 2022-08-11: 0 mL via INTRAVENOUS
  Administered 2022-08-11: 500 mL via INTRAVENOUS

## 2022-08-11 MED ORDER — FERROUS SULFATE 324 MG (65 MG IRON) TABLET,DELAYED RELEASE
324.0000 mg | DELAYED_RELEASE_TABLET | Freq: Every morning | ORAL | Status: DC
Start: 2022-08-12 — End: 2022-08-14
  Administered 2022-08-12 – 2022-08-14 (×3): 324 mg via ORAL
  Filled 2022-08-11 (×3): qty 1

## 2022-08-11 MED ORDER — APIXABAN 5 MG TABLET
5.0000 mg | ORAL_TABLET | Freq: Two times a day (BID) | ORAL | Status: DC
Start: 2022-08-11 — End: 2022-08-14
  Administered 2022-08-11 – 2022-08-14 (×7): 5 mg via ORAL
  Filled 2022-08-11 (×7): qty 1

## 2022-08-11 MED ORDER — GLUCAGON 1 MG/ML SOLUTION FOR INJECTION
1.0000 mg | INTRAMUSCULAR | Status: DC | PRN
Start: 2022-08-11 — End: 2022-08-14

## 2022-08-11 MED ORDER — SODIUM ZIRCONIUM CYCLOSILICATE 10 GRAM ORAL POWDER PACKET
10.0000 g | Freq: Every day | ORAL | Status: DC
Start: 2022-08-13 — End: 2022-08-11

## 2022-08-11 MED ORDER — INSULIN REGULAR HUMAN 100 UNIT/ML INJECTION SSIP
0.0000 [IU] | INJECTION | Freq: Four times a day (QID) | SUBCUTANEOUS | Status: DC | PRN
Start: 2022-08-11 — End: 2022-08-13
  Administered 2022-08-12: 9 [IU] via SUBCUTANEOUS
  Filled 2022-08-11: qty 27

## 2022-08-11 MED ORDER — SODIUM CHLORIDE 0.9 % IV BOLUS
500.0000 mL | INJECTION | Status: AC
Start: 2022-08-11 — End: 2022-08-11
  Administered 2022-08-11: 500 mL via INTRAVENOUS
  Administered 2022-08-11: 0 mL via INTRAVENOUS

## 2022-08-11 MED ORDER — FENTANYL (PF) 50 MCG/ML INJECTION SOLUTION
25.0000 ug | INTRAMUSCULAR | Status: AC
Start: 2022-08-11 — End: 2022-08-11
  Administered 2022-08-11: 25 ug via INTRAVENOUS

## 2022-08-11 MED ORDER — DEXTROSE 5 % AND 0.9 % SODIUM CHLORIDE INTRAVENOUS SOLUTION
INTRAVENOUS | Status: DC
Start: 2022-08-11 — End: 2022-08-13
  Administered 2022-08-11 – 2022-08-13 (×2): 0 mL via INTRAVENOUS

## 2022-08-11 MED ORDER — BISACODYL 5 MG TABLET,DELAYED RELEASE
5.0000 mg | DELAYED_RELEASE_TABLET | ORAL | Status: DC | PRN
Start: 2022-08-11 — End: 2022-08-14

## 2022-08-11 MED ORDER — PYRIDOXINE (VITAMIN B6) 50 MG TABLET
250.0000 mg | ORAL_TABLET | Freq: Two times a day (BID) | ORAL | Status: DC
Start: 2022-08-11 — End: 2022-08-14
  Administered 2022-08-11 – 2022-08-14 (×7): 250 mg via ORAL
  Filled 2022-08-11 (×7): qty 5

## 2022-08-11 MED ORDER — INSULIN REGULAR HUMAN 100 UNIT/ML INJECTION - CHARGE BY DOSE
10.0000 [IU] | INTRAMUSCULAR | Status: AC
Start: 2022-08-11 — End: 2022-08-11
  Administered 2022-08-11: 10 [IU] via INTRAVENOUS

## 2022-08-11 MED ORDER — SODIUM CHLORIDE 0.9 % INTRAVENOUS SOLUTION
INTRAVENOUS | Status: DC
Start: 2022-08-11 — End: 2022-08-11
  Administered 2022-08-11: 0 mL via INTRAVENOUS

## 2022-08-11 MED ORDER — DEXTROSE 50 % IN WATER (D50W) INTRAVENOUS SYRINGE
25.0000 g | INJECTION | INTRAVENOUS | Status: DC | PRN
Start: 2022-08-11 — End: 2022-08-14
  Filled 2022-08-11: qty 50

## 2022-08-11 MED ORDER — ALBUTEROL CONTINUOUS NEBULIZATION - USING 2.5 MG/3 ML
10.0000 mg | INHALATION_SOLUTION | RESPIRATORY_TRACT | Status: DC
Start: 2022-08-11 — End: 2022-08-14
  Administered 2022-08-11: 0 mg via RESPIRATORY_TRACT
  Administered 2022-08-11: 10 mg via RESPIRATORY_TRACT
  Filled 2022-08-11: qty 12

## 2022-08-11 MED ORDER — FENTANYL (PF) 50 MCG/ML INJECTION SOLUTION
INTRAMUSCULAR | Status: AC
Start: 2022-08-11 — End: 2022-08-11
  Filled 2022-08-11: qty 2

## 2022-08-11 MED ORDER — DEXTROSE 50 % IN WATER (D50W) INTRAVENOUS SYRINGE
12.5000 g | INJECTION | INTRAVENOUS | Status: DC | PRN
Start: 2022-08-11 — End: 2022-08-14
  Filled 2022-08-11: qty 50

## 2022-08-11 MED ORDER — DEXTROSE 50 % IN WATER (D50W) INTRAVENOUS SYRINGE
12.5000 g | INJECTION | INTRAVENOUS | Status: DC | PRN
Start: 2022-08-11 — End: 2022-08-14
  Administered 2022-08-13 (×2): 25 mL via INTRAVENOUS

## 2022-08-11 MED ORDER — ATORVASTATIN 20 MG TABLET
20.0000 mg | ORAL_TABLET | Freq: Every evening | ORAL | Status: DC
Start: 2022-08-11 — End: 2022-08-14
  Administered 2022-08-11 – 2022-08-13 (×3): 20 mg via ORAL
  Filled 2022-08-11 (×3): qty 1

## 2022-08-11 MED ORDER — MAGNESIUM SULFATE 1 GRAM/100 ML IN DEXTROSE 5 % INTRAVENOUS PIGGYBACK
INJECTION | INTRAVENOUS | Status: AC
Start: 2022-08-11 — End: 2022-08-11
  Filled 2022-08-11: qty 200

## 2022-08-11 MED ORDER — SODIUM CHLORIDE 0.9 % IV BOLUS
1000.0000 mL | INJECTION | Status: AC
Start: 2022-08-11 — End: 2022-08-11
  Administered 2022-08-11: 1000 mL via INTRAVENOUS
  Administered 2022-08-11: 0 mL via INTRAVENOUS

## 2022-08-11 MED ORDER — HYDROCODONE 5 MG-ACETAMINOPHEN 325 MG TABLET
0.5000 | ORAL_TABLET | Freq: Three times a day (TID) | ORAL | Status: DC
Start: 2022-08-11 — End: 2022-08-13
  Administered 2022-08-11 – 2022-08-13 (×6): 0.5 via ORAL
  Filled 2022-08-11 (×6): qty 1

## 2022-08-11 MED ORDER — INSULIN U-100 REGULAR HUMAN 100 UNIT/ML INJECTION SOLUTION
INTRAMUSCULAR | Status: AC
Start: 2022-08-11 — End: 2022-08-11
  Filled 2022-08-11: qty 30

## 2022-08-11 MED ORDER — PRAMIPEXOLE 0.25 MG TABLET
0.1250 mg | ORAL_TABLET | Freq: Three times a day (TID) | ORAL | Status: DC
Start: 2022-08-11 — End: 2022-08-14
  Administered 2022-08-11 – 2022-08-13 (×6): 0.125 mg via ORAL
  Administered 2022-08-13: 0 mg via ORAL
  Administered 2022-08-13 – 2022-08-14 (×2): 0.125 mg via ORAL
  Administered 2022-08-14: 0 mg via ORAL
  Filled 2022-08-11: qty 1
  Filled 2022-08-11 (×2): qty 0.5
  Filled 2022-08-11 (×6): qty 1

## 2022-08-11 MED ORDER — BUSPIRONE 5 MG TABLET
5.0000 mg | ORAL_TABLET | Freq: Every day | ORAL | Status: DC
Start: 2022-08-11 — End: 2022-08-14
  Administered 2022-08-11 – 2022-08-14 (×4): 5 mg via ORAL
  Filled 2022-08-11 (×4): qty 1

## 2022-08-11 MED ORDER — SODIUM BICARBONATE 8.4 % (1 MEQ/ML) INTRAVENOUS SYRINGE
50.0000 meq | INJECTION | INTRAVENOUS | Status: AC
Start: 2022-08-11 — End: 2022-08-11
  Administered 2022-08-11: 50 meq via INTRAVENOUS

## 2022-08-11 MED ORDER — ACETAMINOPHEN 325 MG TABLET
650.0000 mg | ORAL_TABLET | ORAL | Status: DC | PRN
Start: 2022-08-11 — End: 2022-08-14

## 2022-08-11 MED ORDER — INSULIN GLARGINE 100 UNITS/ML SUBQ - CHARGE BY DOSE
10.0000 [IU] | Freq: Every evening | SUBCUTANEOUS | Status: DC
Start: 2022-08-11 — End: 2022-08-13
  Administered 2022-08-11 – 2022-08-12 (×2): 10 [IU] via SUBCUTANEOUS
  Filled 2022-08-11 (×2): qty 10

## 2022-08-11 MED ORDER — ERGOCALCIFEROL (VITAMIN D2) 1,250 MCG (50,000 UNIT) CAPSULE
50000.0000 [IU] | ORAL_CAPSULE | ORAL | Status: DC
Start: 2022-08-11 — End: 2022-08-14
  Administered 2022-08-11: 0 [IU] via ORAL

## 2022-08-11 MED ORDER — SODIUM BICARBONATE 8.4 % (1 MEQ/ML) INTRAVENOUS SYRINGE
INJECTION | INTRAVENOUS | Status: AC
Start: 2022-08-11 — End: 2022-08-11
  Filled 2022-08-11: qty 50

## 2022-08-11 MED ORDER — SODIUM CHLORIDE 0.9 % INTRAVENOUS SOLUTION
4.0000 [IU]/h | INTRAVENOUS | Status: DC
Start: 2022-08-11 — End: 2022-08-11
  Filled 2022-08-11 (×2): qty 100

## 2022-08-11 MED ORDER — CALCIUM 250 MG (AS CARBONATE)-VITAMIN D3 3.125 MCG (125 UNIT) TABLET
1.0000 | ORAL_TABLET | Freq: Two times a day (BID) | ORAL | Status: DC
Start: 2022-08-11 — End: 2022-08-14
  Administered 2022-08-11 – 2022-08-14 (×7): 1 via ORAL
  Filled 2022-08-11 (×7): qty 1

## 2022-08-11 MED ORDER — SODIUM CHLORIDE 0.9 % INTRAVENOUS SOLUTION
2.0000 [IU]/h | INTRAVENOUS | Status: DC
Start: 2022-08-11 — End: 2022-08-11
  Administered 2022-08-11: 15 [IU]/h via INTRAVENOUS
  Administered 2022-08-11: 0 [IU]/h via INTRAVENOUS
  Administered 2022-08-11: 9 [IU]/h via INTRAVENOUS
  Administered 2022-08-11: 7 [IU]/h via INTRAVENOUS
  Administered 2022-08-11: 15 [IU]/h via INTRAVENOUS
  Administered 2022-08-11: 12 [IU]/h via INTRAVENOUS
  Administered 2022-08-11: 6 [IU]/h via INTRAVENOUS
  Filled 2022-08-11 (×2): qty 100

## 2022-08-11 MED ORDER — ETHYL ALCOHOL 62 % (NOZIN NASAL SANITIZER) NASAL SOLUTION - BULK BOTTLE
1.0000 | Freq: Two times a day (BID) | NASAL | Status: DC
Start: 2022-08-11 — End: 2022-08-14
  Administered 2022-08-11 – 2022-08-14 (×6): 1 via NASAL

## 2022-08-11 MED ORDER — MAGNESIUM SULFATE 1 GRAM/100 ML IN DEXTROSE 5 % INTRAVENOUS PIGGYBACK
1.0000 g | INJECTION | INTRAVENOUS | Status: AC
Start: 2022-08-11 — End: 2022-08-11
  Administered 2022-08-11 (×2): 1 g via INTRAVENOUS
  Administered 2022-08-11 (×2): 0 g via INTRAVENOUS

## 2022-08-11 MED ORDER — DOCUSATE SODIUM 100 MG CAPSULE
100.0000 mg | ORAL_CAPSULE | Freq: Every day | ORAL | Status: DC
Start: 2022-08-11 — End: 2022-08-14
  Administered 2022-08-11 – 2022-08-14 (×4): 100 mg via ORAL
  Filled 2022-08-11 (×4): qty 1

## 2022-08-11 NOTE — ED Nurses Note (Signed)
Patients daughter Estill Bamberg notified of patients bed in the ICU.

## 2022-08-11 NOTE — ED Provider Notes (Signed)
Emergency Medicine    Name: Jordan Buckley  Age and Gender: 82 y.o. female  Date of Birth: 1941-07-16  MRN: [id number]  PCP: Lerry Paterson, PA-C    CC:  Chief Complaint   Patient presents with    Fall    Head Injury With Unknown Loc       HPI:  Jordan Buckley is a 82 y.o. White female with dementia who lives in a local nursing home. She has a history of fall tonight while going to restroom. Fall was unwitnessed and it is unknown whether or not she had syncope. She has been believed to have UTI but no antibiotics have been started. She is diabetic and noted by EMS to be hyperglycemic.      [place] Pain Rating Scale     On a scale of 0-10, during the past 24 hours, pain has interfered with you usual activity:       On a scale of 0-10, during the past 24 hours, pain has interfered with your sleep:      On a scale of 0-10, during the past 24 hours, pain has affected your mood:       On a scale of 0-10, during the past 24 hours, pain has contributed to your stress:       On a scale of 0-10, what is your overall pain Rating:          Below pertinent information reviewed with patient:  Past Medical History:   Diagnosis Date    Alzheimer's dementia (CMS [place])     Arthropathy     Asthma     Cancer (CMS HCC)     COPD (chronic obstructive pulmonary disease) (CMS HCC)     Coronary artery disease     CVA (cerebrovascular accident) (CMS [place])     Depression     Diabetes mellitus, type 2 (CMS HCC)     Hard of hearing     HTN (hypertension)     Osteoporosis     Thyroid disease     Wears glasses            Allergies   Allergen Reactions    Cefaclor Rash    Nitrofurantoin Rash    Quinine Rash    Sulfa (Sulfonamides) Rash    Ciprofloxacin     Dulaglutide     Fluconazole     Nitrofurantoin Macrocrystal     Quinidine-Quinine Analogues (Cinchona Alkaloids)        Past Surgical History:   Procedure Laterality Date    FIXATION KYPHOPLASTY Right 11/27/2021    HX BACK SURGERY      HX CATARACT REMOVAL      HX CESAREAN SECTION      HX CHOLECYSTECTOMY       HX CORONARY ARTERY BYPASS GRAFT      HX HYSTERECTOMY      KIDNEY SURGERY Left     cancerous tumor removed    LIVER RESECTION      WRIST SURGERY Left            Social History        Objective:    ED Triage Vitals [08/11/22 0618]   BP (Non-Invasive) 125/60   Heart Rate 91   Respiratory Rate 20   Temperature 36.4 C (97.5 F)   SpO2 94 %   Weight 56.7 kg (125 lb)   Height 1.626 m (_0 )     Filed Vitals:    08/11/22 0618 08/11/22 0700  BP: 125/60 (!) 112/41   Pulse: 91    Resp: 20    Temp: 36.4 C (97.5 F)    SpO2: 94%        Nursing notes and vital signs reviewed.    Constitutional - No acute distress.  Alert and Active.  HEENT - Normocephalic. Abrasion/skin tear under left eye.   Scleral hematoma of left eye. No hyphema. Eye turgor normal. PERRL. EOMI. Oropharynx with no erythema, lesions, or exudates. Moist mucous membranes.   Neck - Trachea midline. No stridor. No hoarseness. Tender posterior but she says chronically so.  Cardiac - Regular rate and rhythm. No murmurs, rubs, or gallops.  Respiratory - Clear to auscultation bilaterally. No rales, wheezes or rhonchi.  Abdomen - Non-tender, soft, mild distension. No rebound or guarding.   Musculoskeletal - Good AROM. Back non-tender. Left hip tender. Distal pulses intact in both lower extremities.  Skin - Warm and dry, without any rashes or other lesions.  Neuro - Cranial nerves II-XII are grossly intact.  Moving all extremities.  Alert and cooperative, pleasant.    Any pertinent labs and imaging obtained during this encounter reviewed below in MDM.    MDM/ED Course:  EKG shows a sinus rhythm, rate of 96. Normal PR and QRS. No STEMI. Septal Q waves noted.    Patient is alert and pleasant. CT head and cervical spine are negative for acute process. Left hip xray is ordered and pending.  She is acidotic and I am concerned she might have DKA given her accucheck of over 600. Fluids ordered and CMP pending. I am leaving her with Dr.  Vicenta Aly pending results and  admission.    Medical Decision Making  Amount and/or Complexity of Data Reviewed  Labs: ordered. Decision-making details documented in ED Course.  Radiology: ordered. Decision-making details documented in ED Course.  ECG/medicine tests: ordered. Decision-making details documented in ED Course.    Risk  Prescription drug management.  Decision regarding hospitalization.             Orders Placed This Encounter    ADULT ROUTINE BLOOD CULTURE, SET OF 2 ADULT BOTTLES (BACTERIA AND YEAST)    ADULT ROUTINE BLOOD CULTURE, SET OF 2 ADULT BOTTLES (BACTERIA AND YEAST)    CT BRAIN WO IV CONTRAST    CT CERVICAL SPINE WO IV CONTRAST    XR HIP LEFT W PELVIS 2-3 VIEWS    CBC/DIFF    COMPREHENSIVE METABOLIC PANEL, NON-FASTING    LACTIC ACID LEVEL W/ REFLEX FOR LEVEL >2.0    URINALYSIS, MACROSCOPIC AND MICROSCOPIC W/CULTURE REFLEX [PRN ONLY]    CBC WITH DIFF    URINALYSIS, MACROSCOPIC    URINALYSIS, MICROSCOPIC    VENOUS BLOOD GAS/LACTATE    BETA-HYDROXYBUTYRATE, PLASMA OR SERUM    ECG 12 LEAD    NS bolus infusion 500 mL    fentaNYL (SUBLIMAZE) 50 mcg/mL injection    NS bolus infusion 500 mL         Impression:   Clinical Impression   Fall (Primary)   Head injury   Hyperglycemia       Disposition: Admitted          Portions of this note may have been dictated using voice recognition software.     Cruzita Lederer, MD  [facility] ED    -----------------------  Results for orders placed or performed during the hospital encounter of 08/11/22 (from the past 12 hour(s))   POC BLOOD GLUCOSE (RESULTS)   Result Value  Ref Range    GLUCOSE, POC >600 (HH) 50 - 500 mg/dl   CBC WITH DIFF   Result Value Ref Range    WBC 7.1 3.8 - 11.8 x10^3/uL    RBC 4.10 3.63 - 4.92 x10^6/uL    HGB 12.7 10.9 - 14.3 g/dL    HCT 39.5 31.2 - 41.9 %    MCV 96.3 (H) 75.5 - 95.3 fL    MCH 30.9 24.7 - 32.8 pg    MCHC 32.1 (L) 32.3 - 35.6 g/dL    RDW 14.6 12.3 - 17.7 %    PLATELETS 240 140 - 440 x10^3/uL    MPV 7.7 (L) 7.9 - 10.8 fL    NEUTROPHIL % 87 (H) 43  - 77 %    LYMPHOCYTE % 11 (L) 16 - 44 %    MONOCYTE % 2 (L) 5 - 13 %    EOSINOPHIL % 0 (L) %    BASOPHIL % 0 0 - 1 %    NEUTROPHIL # 6.10 1.85 - 7.80 x10^3/uL    LYMPHOCYTE # 0.80 (L) 1.00 - 3.00 x10^3/uL    MONOCYTE # 0.20 (L) 0.30 - 1.00 x10^3/uL    EOSINOPHIL # 0.00 0.00 - 0.50 x10^3/uL    BASOPHIL # 0.00 0.00 - 0.10 x10^3/uL   VENOUS BLOOD GAS/LACTATE   Result Value Ref Range    PH (VENOUS) 7.21 (L) 7.31 - 7.41    PCO2 (VENOUS) 49 41 - 51 mm/Hg    PO2 (VENOUS) 63 (H) 35 - 50 mm/Hg    BICARBONATE (VENOUS) 17.4 (L) 22.0 - 26.0 mmol/L    LACTATE 1.4 <=2.0 mmol/L    OXYHEMOGLOBIN 86.1 %    CARBOXYHEMOGLOBIN 3.4 (H) <=1.5 %    MET-HEMOGLOBIN 0.5 <=2.0 %    %FIO2 (VENOUS) 21.0 %    O2CT 15.8 %    BASE DEFICIT 8.6 (H) 0.0 - 2.0 mmol/L     CT BRAIN WO IV CONTRAST   Final Result   NO ACUTE INTRACRANIAL HEMORRHAGE         One or more dose reduction techniques were used (e.g., Automated exposure control, adjustment of the mA and/or kV according to patient size, use of iterative reconstruction technique).         Radiologist location ID: [id number]         CT CERVICAL SPINE WO IV CONTRAST   Final Result   NO ACUTE CERVICAL FRACTURE.  DEGENERATIVE CHANGES.         One or more dose reduction techniques were used (e.g., Automated exposure control, adjustment of the mA and/or kV according to patient size, use of iterative reconstruction technique).         Radiologist location ID: [id number]         XR HIP LEFT W PELVIS 2-3 VIEWS    (Results Pending)

## 2022-08-11 NOTE — H&P (Signed)
[place]    HOSPITALIST H&P    Jordan Buckley 82 y.o. female ED08/ED08   Date of Service: 08/11/2022    Date of Admission:  08/11/2022   PCP: Lerry Paterson, PA-C Code Status:Prior       Chief Complaint: fall, elevated blood glucose    HPI:   Jordan Buckley is an 82 year old female who presented to ED via EMS from [facility] after a fall. She was found after unwitnessed fall with abrasion below left eye. Glucose level per NH was "high". On presentation to ED, VS WNL. CBC unremarkable. Na 126, K 6, bicarb 13, BUN 35, Cr 1.45 (baseline 0.8-1), glucose 847, anion gap 24. LFTs unremarkable. LA 1.1, B-hydroxybutyrate >8. UA reveals >1k glucose, 80 ketones. ABG reveals a metabolic acidosis. CT head unremarkable. Cervical spine CT unremarkable. She was given IV fluids in ED and started on insulin drip. ICU team contacted for admission. Patient seen and evaluated in the ED. She was a fairly poor historian. Most of the history obtained from discussion with ED physician and review of the medical record.      ED medications:   Medications Administered in the ED   NS bolus infusion 500 mL (500 mL Intravenous New Bag/New Syringe 08/11/22 0710)   fentaNYL (SUBLIMAZE) 50 mcg/mL injection (25 mcg Intravenous Given 08/11/22 0720)   NS bolus infusion 500 mL (has no administration in time range)         PMHx:    Past Medical History:   Diagnosis Date    Alzheimer's dementia (CMS [place])     Arthropathy     Asthma     Cancer (CMS HCC)     COPD (chronic obstructive pulmonary disease) (CMS HCC)     Coronary artery disease     CVA (cerebrovascular accident) (CMS [place])     Depression     Diabetes mellitus, type 2 (CMS HCC)     Hard of hearing     HTN (hypertension)     Osteoporosis     Thyroid disease     Wears glasses         PSHx:   Past Surgical History:   Procedure Laterality Date    FIXATION KYPHOPLASTY Right 11/27/2021    HX BACK SURGERY      HX CATARACT REMOVAL      HX CESAREAN SECTION      HX  CHOLECYSTECTOMY      HX CORONARY ARTERY BYPASS GRAFT      HX HYSTERECTOMY      KIDNEY SURGERY Left     cancerous tumor removed    LIVER RESECTION      WRIST SURGERY Left           Allergies:    Allergies   Allergen Reactions    Cefaclor Rash    Nitrofurantoin Rash    Quinine Rash    Sulfa (Sulfonamides) Rash    Ciprofloxacin     Dulaglutide     Fluconazole     Nitrofurantoin Macrocrystal     Quinidine-Quinine Analogues ([place]inchona Alkaloids)     Social History  Social History     Tobacco Use    Smoking status: Never    Smokeless tobacco: Never   Vaping Use    Vaping Use: Never used   Substance Use Topics    Alcohol use: Never    Drug use: Never       Family History  Family Medical History:  Problem Relation (Age of Onset)    No Known Problems Mother, Father               Home Meds:      Prior to Admission medications    Medication Sig Start Date End Date Taking? Authorizing Provider   acetaminophen (TYLENOL) 325 mg Oral Tablet Take 2 Tablets (650 mg total) by mouth Every 4 hours as needed for Pain or Fever   Yes Provider, Historical   apixaban (ELIQUIS) 5 mg Oral Tablet Take 1 Tablet (5 mg total) by mouth Twice daily   Yes Provider, Historical   atorvastatin (LIPITOR) 20 mg Oral Tablet Take 1 Tablet (20 mg total) by mouth Every night   Yes Provider, Historical   bisacodyL (DULCOLAX) 5 mg Oral Tablet, Delayed Release (E.C.) Take 1 Tablet (5 mg total) by mouth Every 72 hours as needed for Constipation   Yes Provider, Historical   busPIRone (BUSPAR) 5 mg Oral Tablet Take 1 Tablet (5 mg total) by mouth Once a day 05/01/22  Yes Provider, Historical   calcium carbonate-vitamin D3 (CALCIUM 600 + D) 600 mg-10 mcg (400 unit) Oral Tablet Take 1 Tablet by mouth Twice daily   Yes Provider, Historical   dextromethorphan-guaiFENesin (ROBAFEN DM COUGH) 10-100 mg/5 mL Oral Liquid Take 10 mL by mouth Every 4 hours as needed   Yes Provider, Historical   docusate sodium (COLACE) 100 mg Oral Capsule Take 1 Capsule (100 mg total) by  mouth Once a day 12/20/21  Yes Sherol Dade, PA-C   ergocalciferol, vitamin D2, (DRISDOL) 1,250 mcg (50,000 unit) Oral Capsule Take 1 Capsule (50,000 Units total) by mouth On the first of the month   Yes Provider, Historical   escitalopram oxalate (LEXAPRO) 5 mg Oral Tablet Take 1 Tablet (5 mg total) by mouth Once a day 05/01/22  Yes Provider, Historical   ferrous sulfate (FEOSOL) 325 mg (65 mg iron) Oral Tablet Take 1 Tablet (325 mg total) by mouth Once a day 01/20/22  Yes Vale Haven, DO   HYDROcodone-acetaminophen [facility]) 5-325 mg Oral Tablet Take 0.5 Tablets by mouth Three times a day 05/14/22  Yes Provider, Historical   JANUVIA 100 mg Oral Tablet Take 1 Tablet (100 mg total) by mouth Once a day 05/01/22  Yes Provider, Historical   levothyroxine (SYNTHROID) 75 mcg Oral Tablet Take 1 Tablet (75 mcg total) by mouth Every morning   Yes Provider, Historical   lidocaine (LIDODERM) 5 % Adhesive Patch, Medicated Place 1 Patch (700 mg total) on the skin Once a day   Yes Provider, Historical   loperamide (IMODIUM) 2 mg Oral Capsule Take 1 Capsule (2 mg total) by mouth Once, as needed After each loose stool, not to exceed 4 caplets in 24 hours   Yes Provider, Historical   mag hydrox/aluminum hyd/simeth (RULOX ORAL) Take 20 mL by mouth Every 4 hours as needed No more than 91m in a 24 hour period   Yes Provider, Historical   magnesium hydroxide (MILK OF MAGNESIA) 400 mg/5 mL Oral Suspension Take 30 mL (2,400 mg total) by mouth Once per day as needed   Yes Provider, Historical   magnesium oxide (MAG-OX) 400 mg Oral Tablet Take 1 Tablet (400 mg total) by mouth Three times a day   Yes Provider, Historical   miconazole nitrate (SECURA) 2 % Cream Apply topically Three times a day as needed   Yes Provider, Historical   mineral oil (FLEET) Rectal Enema Insert 133 mL into the rectum Every  72 hours as needed   Yes Provider, Historical   NOVOLOG U-100 INSULIN ASPART 100 unit/mL Subcutaneous Solution Inject under the skin Per  sliding scale 03/26/22  Yes Provider, Historical   ondansetron (ZOFRAN ODT) 4 mg Oral Tablet, Rapid Dissolve Take 1 Tablet (4 mg total) by mouth Every 8 hours as needed for Nausea/Vomiting 12/03/21  Yes Benard Rink, APRN   pramipexole (MIRAPEX) 0.125 mg Oral Tablet Take 1 Tablet (0.125 mg total) by mouth Three times a day   Yes Provider, Historical   Pyridoxine (VITAMIN B6) 250 mg Oral Tablet Take 1 Tablet (250 mg total) by mouth Twice daily   Yes Provider, Historical   insulin glargine 100 unit/mL Subcutaneous injection Inject 7 Units under the skin Every 12 hours for 30 days 01/20/22 08/11/22  Vale Haven, DO   insulin R human (HUMULIN R) 100 units/mL Subcutaneous Injectable Inject 0-12 Units under the skin Three times daily before meals 01/08/22 08/11/22  [name], MD   MetFORMIN (GLUCOPHAGE) 1,000 mg Oral Tablet Take 1 Tablet (1,000 mg total) by mouth Twice daily with food  08/11/22  Provider, Historical          ROS:   Unable to obtain secondary to profoundly [facility], demented      Results for orders placed or performed during the hospital encounter of 08/11/22 (from the past 24 hour(s))   POC BLOOD GLUCOSE (RESULTS)   Result Value Ref Range    GLUCOSE, POC >600 (HH) 50 - 500 mg/dl   ECG 12 LEAD   Result Value Ref Range    Ventricular rate 96 BPM    Atrial Rate 96 BPM    PR Interval 170 ms    QRS Duration 90 ms    QT Interval 332 ms    QTC Calculation 419 ms    Calculated P Axis 80 degrees    Calculated R Axis 99 degrees    Calculated T Axis 22 degrees   COMPREHENSIVE METABOLIC PANEL, NON-FASTING   Result Value Ref Range    SODIUM 124 (L) 136 - 145 mmol/L    POTASSIUM 6.1 (HH) 3.5 - 5.1 mmol/L    CHLORIDE 85 (L) 98 - 107 mmol/L    CO2 TOTAL 18 (L) 21 - 31 mmol/L    ANION GAP 21 (H) 4 - 13 mmol/L    BUN 36 (H) 7 - 25 mg/dL    CREATININE 1.45 (H) 0.60 - 1.30 mg/dL    BUN/CREA RATIO 25 (H) 6 - 22    ESTIMATED GFR 36 (L) >59 mL/min/1.50m2    ALBUMIN 3.8 3.5 - 5.7 g/dL    CALCIUM 9.7 8.6 - 10.3 mg/dL     GLUCOSE 881 (HH) 74 - 109 mg/dL    ALKALINE PHOSPHATASE 85 34 - 104 U/L    ALT (SGPT) 14 7 - 52 U/L    AST (SGOT) 15 13 - 39 U/L    BILIRUBIN TOTAL 0.8 0.3 - 1.2 mg/dL    PROTEIN TOTAL 7.0 6.4 - 8.9 g/dL    ALBUMIN/GLOBULIN RATIO 1.2 0.8 - 1.4    OSMOLALITY, CALCULATED 302 (H) 270 - 290 mOsm/kg    CALCIUM, CORRECTED 9.9 8.9 - 10.8 mg/dL    GLOBULIN 3.2 2.9 - 5.4   LACTIC ACID LEVEL W/ REFLEX FOR LEVEL >2.0   Result Value Ref Range    LACTIC ACID 1.1 0.5 - 2.2 mmol/L   CBC WITH DIFF   Result Value Ref Range    WBC 7.1 3.8 - 11.8  x10^3/uL    RBC 4.10 3.63 - 4.92 x10^6/uL    HGB 12.7 10.9 - 14.3 g/dL    HCT 39.5 31.2 - 41.9 %    MCV 96.3 (H) 75.5 - 95.3 fL    MCH 30.9 24.7 - 32.8 pg    MCHC 32.1 (L) 32.3 - 35.6 g/dL    RDW 14.6 12.3 - 17.7 %    PLATELETS 240 140 - 440 x10^3/uL    MPV 7.7 (L) 7.9 - 10.8 fL    NEUTROPHIL % 87 (H) 43 - 77 %    LYMPHOCYTE % 11 (L) 16 - 44 %    MONOCYTE % 2 (L) 5 - 13 %    EOSINOPHIL % 0 (L) %    BASOPHIL % 0 0 - 1 %    NEUTROPHIL # 6.10 1.85 - 7.80 x10^3/uL    LYMPHOCYTE # 0.80 (L) 1.00 - 3.00 x10^3/uL    MONOCYTE # 0.20 (L) 0.30 - 1.00 x10^3/uL    EOSINOPHIL # 0.00 0.00 - 0.50 x10^3/uL    BASOPHIL # 0.00 0.00 - 0.10 x10^3/uL   VENOUS BLOOD GAS/LACTATE   Result Value Ref Range    PH (VENOUS) 7.21 (L) 7.31 - 7.41    PCO2 (VENOUS) 49 41 - 51 mm/Hg    PO2 (VENOUS) 63 (H) 35 - 50 mm/Hg    BICARBONATE (VENOUS) 17.4 (L) 22.0 - 26.0 mmol/L    LACTATE 1.4 <=2.0 mmol/L    OXYHEMOGLOBIN 86.1 %    CARBOXYHEMOGLOBIN 3.4 (H) <=1.5 %    MET-HEMOGLOBIN 0.5 <=2.0 %    %FIO2 (VENOUS) 21.0 %    O2CT 15.8 %    BASE DEFICIT 8.6 (H) 0.0 - 2.0 mmol/L   BETA-HYDROXYBUTYRATE, PLASMA OR SERUM   Result Value Ref Range    BETA-HYDROXYBUTYRATE >8.00 (H) 0.02 - 0.29 mmol/L   ECG 12 LEAD   Result Value Ref Range    Ventricular rate 106 BPM    Atrial Rate 106 BPM    PR Interval 352 ms    QRS Duration 92 ms    QT Interval 242 ms    QTC Calculation 321 ms    Calculated P Axis 64 degrees    Calculated R Axis 102 degrees     Calculated T Axis 256 degrees   URINALYSIS, MACROSCOPIC   Result Value Ref Range    COLOR Yellow Colorless, Light Yellow, Yellow    APPEARANCE Clear Clear    SPECIFIC GRAVITY 1.023 1.002 - 1.030    PH 5.0 5.0 - 9.0    LEUKOCYTES Negative Negative, 100  WBCs/uL    NITRITE Negative Negative    PROTEIN Negative Negative, 10 , 20  mg/dL    GLUCOSE >1000 (A) Negative, 30  mg/dL    KETONES 80 (A) Negative, Trace mg/dL    BILIRUBIN Negative Negative, 0.5 mg/dL    BLOOD Negative Negative, 0.03 mg/dL    UROBILINOGEN Normal Normal mg/dL   URINALYSIS, MICROSCOPIC   Result Value Ref Range    BACTERIA Negative Negative /hpf    MUCOUS Rare (A) (none) /hpf    BUDDING YEAST Rare (A) (none) /hpf    RBCS <1 <4 /hpf    WBCS 1 <6 /hpf    SQUAMOUS EPITHELIAL <1 <28 /hpf   BASIC METABOLIC PANEL - STAT   Result Value Ref Range    SODIUM 126 (L) 136 - 145 mmol/L    POTASSIUM 6.0 (HH) 3.5 - 5.1 mmol/L    CHLORIDE 89 (L) 98 -  107 mmol/L    CO2 TOTAL 13 (L) 21 - 31 mmol/L    ANION GAP 24 (H) 4 - 13 mmol/L    CALCIUM 9.3 8.6 - 10.3 mg/dL    GLUCOSE 847 (HH) 74 - 109 mg/dL    BUN 35 (H) 7 - 25 mg/dL    CREATININE 1.40 (H) 0.60 - 1.30 mg/dL    BUN/CREA RATIO 25 (H) 6 - 22    ESTIMATED GFR 38 (L) >59 mL/min/1.12m2    OSMOLALITY, CALCULATED 303 (H) 270 - 290 mOsm/kg   MAGNESIUM   Result Value Ref Range    MAGNESIUM 2.2 1.9 - 2.7 mg/dL   TROPONIN-I   Result Value Ref Range    TROPONIN I 10 <15 ng/L   PHOSPHORUS - STAT   Result Value Ref Range    PHOSPHORUS 4.8 3.7 - 7.2 mg/dL   POC BLOOD GLUCOSE (RESULTS)   Result Value Ref Range    GLUCOSE, POC >600 (HH) 50 - 500 mg/dl   POC BLOOD GLUCOSE (RESULTS)   Result Value Ref Range    GLUCOSE, POC >600 (HH) 50 - 500 mg/dl          Physical:  Filed Vitals:    08/11/22 1035 08/11/22 1045 08/11/22 1100 08/11/22 1115   BP:  (!) 99/37 (!) 101/39 (!) 91/42   Pulse:  (!) 105 (!) 105 (!) 105   Resp: _0 Temp:       SpO2: 99% (!) 87% 97% 98%      General: Patient is alert. Profoundly hard of hearing.    Head: Normocephalic and atraumatic.    Eyes: Pupils equally round and react to light and accommodate. Extraocular movements intact.  Conjunctiva normal. Sclerae are normal.    Nose: Nasal passages clear.   Throat: Dry oral mucosa. No erythema or exudate of the pharynx. Clear oropharynx.    Neck: Supple. No cervical lymphadenopathy or supraclavicular nodes detected. Trachea midline   Heart: Regular rate and rhythm. S1 & S2 present. No S3 or S4. No rubs, gallops, or murmurs appreciated.  Radial and dorsalis pedis pulses +2/4 bilaterally.  Brisk capillary refill.    Lungs: Clear to auscultation bilaterally with no wheezes or rales. Equal chest excursion.  No conversational dyspnea. No respiratory distress noted.   Abdomen: Soft, nontender, nondistended belly. Bowel sounds are present in all four quadrants. No rigidity.  No guarding.  No ascites.   Extremities: No edema, cyanosis, or clubbing. Grossly moves all extremities.    Skin: Warm and dry without lesions. No ecchymosis noted.    Neurologic: Cranial nerves II through XII are grossly intact. Sensation to light touch is intact. Strength 5/5 in upper extremities and lower extremities bilaterally.    Genitourinary:  No urinary incontinence or Foley catheter   Psychiatric: Judgment and insight are intact. Mood and affect are appropriate for the situation.       Diagnostic studies:  No results found.       EKG interpretation: Sinus rhythm, rate 96, nonspecific IVCD, PR 170 ms, QRS 90 ms, QTC 419 ms      _1 @    Assessments:  Active Hospital Problems   (*Primary Problem)    Diagnosis    *DKA (diabetic ketoacidosis) (CMS [place])    Fall    Pseudohyponatremia    Hyperkalemia    AKI (acute kidney injury) (CMS [place]    Hyperlipidemia, unspecified    Dementia (CMS HCC)    Anticoagulant long-term use  Hypothyroidism     DKA  Hyperkalemia  Fall  Pseudohyponatremia  AKI        Plan:    Admit to ICU/CCU. DKA insulin drip protocol. IV hydration.  Serial BMPLs/VBGs. Hourly accu  checks. NPO except sips water/ice chips. Continue home medications as appropriate; see orders. Repeat labs AM. Further interventions will depend on clinical course and progress.      Patient is a 82 year old female who will be admitted for the above problems.      Code status: Prior  DVT prophylaxis: apixaban    Diet: DIET NPO - NOW EXCEPT ICE CHIPS, EXCEPT SIPS OF WATER    Disposition:  The patient is currently acutely ill requiring treatment in the ICU for DKA, hyperkalemia, fall, AKI. Patient will be closely evaluated monitor and remove be adjusted accordingly.  Estimated length of stay greater than 48 hr to obtain full medical treatment.      Tivis Ringer, FNP-BC    [place] HOSPITALIST

## 2022-08-11 NOTE — ED Nurses Note (Signed)
Report called to ICU

## 2022-08-11 NOTE — ED Triage Notes (Signed)
Brought to ED via [facility] from [facility] with c/o head injury after fall. Unwitnessed. Unknown LOC. Blood sugar reading "high". Staff reports pt was diagnosed with a UTI yesterday, no treatment prescribed.

## 2022-08-11 NOTE — Respiratory Therapy (Signed)
Continuous 10 mg albuterol hhn tx given at this time.

## 2022-08-11 NOTE — Care Plan (Signed)
Pt admitted to ICU 2 with DKA. Patient fell at [facility] and upon assessment in ER, blood sugar is 847. Insulin gtt in use along with NS running at 125 ml/hr. Accuchecks ordered every hour. Fall and skin precautions in place. Transition readiness ongoing.

## 2022-08-11 NOTE — ED Nurses Note (Signed)
PT transported to ICU. Insulin drip infusing upon transfer. Care endorsed.

## 2022-08-11 NOTE — ED Attending Handoff Note (Signed)
[facility]  Emergency Department  Course Note    Care/report received from Dr. Rosalee Kaufman at [place].   Per report:  Jordan Buckley is a 82 y.o. female who had concerns including Fall and Head Injury With Unknown Loc. this patient was received in handoff from Dr. Rosalee Kaufman at shift change.  This patient did have findings that could be consistent with diabetic ketoacidosis with lower pH, ketones known in the urine, elevated anion gap, and hyperglycemia.  In discussion with the patient's daughter, she has experienced diabetic ketoacidosis previously.  A urine culture obtained in days prior does not show any growth.  I did add on a beta hydroxybutyrate, in addition to troponin.  There was a delay in obtaining these lab results unfortunately.  A repeat chemistry showed improvement of renal function, improvement of glucose, improvement of hyperkalemia.  The patient was treated in regards to DKA and hyperkalemia with IV insulin, and insulin infusion, IV fluids, magnesium, sodium bicarbonate, and Lokelma.  Albuterol will also be ordered.  Considering the patient has softer blood pressures, we will hold on Lasix at this point in time.        Course:   ED Course as of 08/11/22 1047   Mon Aug 11, 2022   [id number] Dr. Flavia Shipper Paged.   [phone] Discussed with [name].       Patient will be admitted to the ICU service for further workup and management.    Disposition: Admitted    Clinical Impression   Fall (Primary)   Head injury   Hyperglycemia         Vonda Antigua, MD

## 2022-08-12 DIAGNOSIS — E119 Type 2 diabetes mellitus without complications: Secondary | ICD-10-CM

## 2022-08-12 LAB — CBC
HCT: 32.7 % (ref 31.2–41.9)
HGB: 11.1 g/dL (ref 10.9–14.3)
MCH: 31.2 pg (ref 24.7–32.8)
MCHC: 34 g/dL (ref 32.3–35.6)
MCV: 91.8 fL (ref 75.5–95.3)
MPV: 7 fL — ABNORMAL LOW (ref 7.9–10.8)
PLATELETS: 226 10*3/uL (ref 140–440)
RBC: 3.57 10*6/uL — ABNORMAL LOW (ref 3.63–4.92)
RDW: 13.7 % (ref 12.3–17.7)
WBC: 7.9 10*3/uL (ref 3.8–11.8)

## 2022-08-12 LAB — BASIC METABOLIC PANEL
ANION GAP: 4 mmol/L (ref 4–13)
ANION GAP: 9 mmol/L (ref 4–13)
ANION GAP: 10 mmol/L (ref 4–13)
BUN/CREA RATIO: 23 — ABNORMAL HIGH (ref 6–22)
BUN/CREA RATIO: 22 (ref 6–22)
BUN/CREA RATIO: 23 — ABNORMAL HIGH (ref 6–22)
BUN: 23 mg/dL (ref 7–25)
BUN: 20 mg/dL (ref 7–25)
BUN: 20 mg/dL (ref 7–25)
CALCIUM: 8.8 mg/dL (ref 8.6–10.3)
CALCIUM: 8.9 mg/dL (ref 8.6–10.3)
CALCIUM: 8.6 mg/dL (ref 8.6–10.3)
CHLORIDE: 102 mmol/L (ref 98–107)
CHLORIDE: 102 mmol/L (ref 98–107)
CHLORIDE: 99 mmol/L (ref 98–107)
CO2 TOTAL: 30 mmol/L (ref 21–31)
CO2 TOTAL: 22 mmol/L (ref 21–31)
CO2 TOTAL: 21 mmol/L (ref 21–31)
CREATININE: 0.99 mg/dL (ref 0.60–1.30)
CREATININE: 0.9 mg/dL (ref 0.60–1.30)
CREATININE: 0.86 mg/dL (ref 0.60–1.30)
ESTIMATED GFR: 57 mL/min/{1.73_m2} — ABNORMAL LOW (ref >59)
ESTIMATED GFR: 64 mL/min/{1.73_m2} (ref >59)
ESTIMATED GFR: 68 mL/min/{1.73_m2} (ref >59)
GLUCOSE: 66 mg/dL — ABNORMAL LOW (ref 74–109)
GLUCOSE: 69 mg/dL — ABNORMAL LOW (ref 74–109)
GLUCOSE: 277 mg/dL — ABNORMAL HIGH (ref 74–109)
OSMOLALITY, CALCULATED: 274 mOsm/kg (ref 270–290)
OSMOLALITY, CALCULATED: 267 mOsm/kg — ABNORMAL LOW (ref 270–290)
OSMOLALITY, CALCULATED: 274 mOsm/kg (ref 270–290)
POTASSIUM: 4 mmol/L (ref 3.5–5.1)
POTASSIUM: 5 mmol/L (ref 3.5–5.1)
POTASSIUM: 4.9 mmol/L (ref 3.5–5.1)
SODIUM: 136 mmol/L (ref 136–145)
SODIUM: 133 mmol/L — ABNORMAL LOW (ref 136–145)
SODIUM: 130 mmol/L — ABNORMAL LOW (ref 136–145)

## 2022-08-12 LAB — POC BLOOD GLUCOSE (RESULTS)
GLUCOSE, POC: 96 mg/dl (ref 50–500)
GLUCOSE, POC: 277 mg/dl (ref 50–500)

## 2022-08-12 LAB — MAGNESIUM: MAGNESIUM: 2 mg/dL (ref 1.9–2.7)

## 2022-08-12 NOTE — Progress Notes (Signed)
[place]    HOSPITALIST PROGRESS NOTE    Jordan Buckley  Date of service: 08/12/2022  Date of Admission:  08/11/2022  Hospital Day:  LOS: 1 day     Subjective:   Patient seen in follow up for DKA, fall, pseudohyponatremia, AKI. She was started on insulin drip yesterday, stopped approximately 1930 08/11/21. Transitioned to basal (Lantus 10 units nightly) and bolus (SSI coverage) insulin regimen. No events overnight per nursing. Labs and imaging reviewed.      Vital Signs:  Filed Vitals:    08/12/22 0800 08/12/22 0900 08/12/22 1100 08/12/22 1200   BP: 115/61 (!) 150/84 114/73 (!) 142/79   Pulse: 63 70 61 70   Resp:   (!) 23 14   Temp:       SpO2: 100% 100% 97% 99%        Physical Exam:  General:  Patient in NAD, resting in bed, no visitors present  Head:  Normocephalic, atraumatic  Eyes:  PERRL, anicteric sclera  ENT:  Oral mucosa moist, no nasal discharge   Neck:  Soft, supple, trachea midline  Heart:  RRR, S1 and S2 normal  Lungs:  Unlabored respirations.  Lungs are clear to auscultation bilaterally, with no wheezes, no rales, no conversational dyspnea  Abdomen:  Soft, active bowel sounds, non-tender to palpation, non-distended  Extremities:  Pulses equal bilaterally.  Capillary refill less than 3 seconds.  No edema in lower extremities bilaterally   Skin:  Warm and dry, not diaphoretic.  No ecchymosis noted.   Neuro:  A&O x 3.  No focal deficits.  Speech intact  Psych:  Cooperative, not agitated    Intake & Output:    Intake/Output Summary (Last 24 hours) at 08/12/2022 1253  Last data filed at 08/12/2022 0500  Gross per 24 hour   Intake 100 ml   Output 500 ml   Net -400 ml     I/O current shift:  No intake/output data recorded.  Emesis:    BM:       Heme:      acetaminophen (TYLENOL) tablet, 650 mg, Oral, Q4H PRN  albuterol continuous nebulization, 10 mg, Continuous Nebulization, Continuous  alcohol 62 % (NOZIN NASAL SANITIZER) nasal solution, 1 Each, Each Nostril, 2x/day  apixaban (ELIQUIS)  tablet, 5 mg, Oral, 2x/day  atorvastatin (LIPITOR) tablet, 20 mg, Oral, NIGHTLY  bisacodyl (DULCOLAX) enteric coated tablet, 5 mg, Oral, Q72H PRN  busPIRone (BUSPAR) tablet, 5 mg, Oral, Daily  calcium carbonate-vitamin D3 63m (2544m-125 units per tablet, 1 Tablet, Oral, 2x/day  citalopram (CeleXA) tablet, 10 mg, Oral, Daily  D5W NS premix infusion, , Intravenous, Continuous  dextrose 50% (0.5 g/mL) injection - syringe, 12.5 g, Intravenous, Q1H PRN  dextrose 50% (0.5 g/mL) injection - syringe, 25 g, Intravenous, Q15 Min PRN   Or  dextrose 50% (0.5 g/mL) injection - syringe, 12.5 g, Intravenous, Q15 Min PRN   Or  glucagon (GLUCAGEN DIAGNOSTIC KIT) injection 1 mg, 1 mg, Subcutaneous, Q15 Min PRN   Or  glucagon (GLUCAGEN DIAGNOSTIC KIT) injection 1 mg, 1 mg, IntraMUSCULAR, Q15 Min PRN  docusate sodium (COLACE) capsule, 100 mg, Oral, Daily  ergocalciferol-vitamin D2 (DRISDOL) capsule, 50,000 Units, Oral, Q30 Days  ferrous sulfate 324 mg (65 mg elemental IRON) tablet, 324 mg, Oral, Daily before Breakfast  HYDROcodone-acetaminophen (NORCO) 5-325 mg per tablet, 0.5 Tablet, Oral, 3x/day  insulin glargine 100 units/mL injection, 10 Units, Subcutaneous, NIGHTLY  levothyroxine (SYNTHROID) tablet, 75 mcg, Oral, QAM  magnesium oxide (MAG-OX)  429m (2418melemental magnesium) tablet, 400 mg, Oral, 3x/day  pramipexole (MIRAPEX) tablet, 0.125 mg, Oral, 3x/day  pyridOXINE Vitamin B6 tablet, 250 mg, Oral, 2x/day  SSIP insulin R human (HumuLIN R) 100 units/mL injection, 0-12 Units, Subcutaneous, 4x/day PRN          Labs:  Recent Results (from the past 48 hour(s))   CBC WITH DIFF    Collection Time: 08/11/22  7:00 AM   Result Value    WBC 7.1    HGB 12.7    HCT 39.5    PLATELETS 240      Results for orders placed or performed during the hospital encounter of 08/11/22 (from the past 48 hour(s))   BASIC METABOLIC PANEL - AM ONCE    Collection Time: 08/12/22 10:47 AM   Result Value    SODIUM 133 (L)    POTASSIUM 5.0    CHLORIDE 102     CO2 TOTAL 22    GLUCOSE 69 (L)    BUN 20    CREATININE 0.90      Recent Results (from the past 48 hour(s))   COMPREHENSIVE METABOLIC PANEL, NON-FASTING    Collection Time: 08/11/22  7:00 AM   Result Value    ALKALINE PHOSPHATASE 85    ALT (SGPT) 14    AST (SGOT) 15      Results for orders placed or performed during the hospital encounter of 08/11/22 (from the past 48 hour(s))   TROPONIN-I    Collection Time: 08/11/22  8:20 AM   Result Value    TROPONIN I 10      No results found for this or any previous visit (from the past 48 hour(s)).   Results for orders placed or performed in visit on 07/14/22 (from the past 1344 hour(s))   HGA1C (HEMOGLOBIN A1C WITH EST AVG GLUCOSE)    Collection Time: 07/14/22  1:30 PM   Result Value    HEMOGLOBIN A1C 10.0 (H)      Results for orders placed or performed during the hospital encounter of 08/11/22 (from the past 48 hour(s))   VENOUS BLOOD GAS/LACTATE    Collection Time: 08/11/22  6:13 PM   Result Value    LACTATE 4.8 (HH)   VENOUS BLOOD GAS/LACTATE    Collection Time: 08/11/22  3:13 PM   Result Value    LACTATE 8.9 (HH)   VENOUS BLOOD GAS/LACTATE    Collection Time: 08/11/22 12:57 PM   Result Value    LACTATE 8.3 (HH)   VENOUS BLOOD GAS/LACTATE    Collection Time: 08/11/22  7:00 AM   Result Value    LACTATE 1.4        Microbiology:  Hospital Encounter on 08/11/22 (from the past 96 hour(s))   ADULT ROUTINE BLOOD CULTURE, SET OF 2 ADULT BOTTLES (BACTERIA AND YEAST)    Collection Time: 08/11/22  7:00 AM    Specimen: Blood   Culture Result Status    BLOOD CULTURE, ROUTINE No Growth 18-24 hrs. Preliminary   ADULT ROUTINE BLOOD CULTURE, SET OF 2 ADULT BOTTLES (BACTERIA AND YEAST)    Collection Time: 08/11/22  7:00 AM    Specimen: Blood   Culture Result Status    BLOOD CULTURE, ROUTINE No Growth 18-24 hrs. Preliminary       Imaging:   ECG 12 LEAD  Normal sinus rhythm  Rightward axis  Septal infarct (cited on or before 16-Dec-2021)  Abnormal ECG  Confirmed by PaPosey ProntoBrijesh ([id number]) on  08/11/2022 10:48:57 AM  ECG 12 LEAD  Sinus tachycardia with 1st degree AV block  Rightward axis  Septal infarct (cited on or before 16-Dec-2021)  Abnormal ECG  Confirmed by Posey Pronto, Brijesh ([id number]) on 08/11/2022 10:44:24 AM  XR AP MOBILE CHEST  Narrative: Dhiya PEARL Reddoch    RADIOLOGIST: Berton Bon, MD    XR AP MOBILE CHEST performed on 08/11/2022 9:03 AM    CLINICAL HISTORY: Screening.  port ap erect  non smoker    TECHNIQUE: Frontal view of the chest.    COMPARISON:  12/25/2021    FINDINGS:  Sternotomy wires are present.   Heart size is mildly enlarged.     Mild left basilar atelectasis/scarring   Degenerative changes are identified within the thoracic spine.    Impression: Probable left basilar scarring. No significant change from the prior exam    Radiologist location ID: [id number]  Narrative: Legrand Rams PEARL Crispen    RADIOLOGIST: Diana Eves, MD    XR HIP LEFT W PELVIS 2-3 VIEWS performed on 08/11/2022 8:00 AM    CLINICAL HISTORY: pain post fall.  fell    TECHNIQUE:  3 views of the left hip including AP pelvis.    COMPARISON:  01/28/2020    FINDINGS:   No fracture.  No suspicious bone lesion. The bones are osteopenic.  Normal alignment.  Soft tissues are unremarkable.  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 The visualized hardware is intact.  Impression: NO ACUTE FRACTURE OR DISLOCATION.     If acute hip fracture is suspected after a fall or minor trauma and initial radiographs are negative then MRI of the pelvis and affected hip without IV contrast or CT of the pelvis and hips without IV contrast is usually appropriate as the next imaging study. (ACR Appropriateness Criteria: Acute Hip Pain-Suspected Fracture, 2018)     Radiologist location ID: [id number]  CT CERVICAL SPINE WO IV CONTRAST  Narrative: Abryana PEARL Perreira    RADIOLOGIST:  Berton Bon, MD    CT CERVICAL SPINE WO IV CONTRAST performed on 08/11/2022 6:35 AM    CLINICAL HISTORY: fall.  FALL, HIT HEAD, POOR HISTORIAN    TECHNIQUE:  Cervical spine CT without contrast.      COMPARISON: None.  # of known CTs in the past 12 months:  12   # of known Cardiac Nuclear Medicine Studies in the past 12 months:  0    FINDINGS:  Alignment: There is multilevel disc narrowing and spurring. There is straightening of the normal lordosis that may represent spasm. Mild scoliosis convexity to the left.    Vertebrae: No acute fracture    Soft Tissues:   No large prevertebral hematoma  There is moderate to severe recess encroachment at the C4-5 C5-6 and C6-7 levels.  Impression: NO ACUTE CERVICAL FRACTURE.  DEGENERATIVE CHANGES.    One or more dose reduction techniques were used (e.g., Automated exposure control, adjustment of the mA and/or kV according to patient size, use of iterative reconstruction technique).    Radiologist location ID: [id number]  CT BRAIN WO IV CONTRAST  Narrative: Adrian PEARL Julio    RADIOLOGIST: Berton Bon, MD    CT BRAIN WO IV CONTRAST performed on 08/11/2022 6:38 AM    CLINICAL HISTORY: head injury/eliquis.  FALL, HIT HEAD, POOR HISTORIAN    TECHNIQUE:  Head CT without  intravenous contrast.    COMPARISON: 12/25/2021  # of known CTs in the past 12 months:  11   # of known Cardiac Nuclear Medicine Studies in the past 12 months:  0    FINDINGS:  There is no acute intracranial hemorrhage, mass effect, or evidence of large acute infarct.    Brain: Low density in the periventricular white matter suggests mild chronic small vessel ischemic changes.    CSF Spaces: Moderate generalized cerebral atrophy     Sinuses/Mastoids:  Clear at visualized levels     Bones: Unremarkable  Impression: NO ACUTE INTRACRANIAL HEMORRHAGE    One or more dose reduction techniques were used (e.g., Automated exposure control, adjustment of the mA and/or kV according to patient size, use of iterative reconstruction  technique).    Radiologist location ID: [id number]        Assessment/ Plan:   Active Hospital Problems   (*Primary Problem)    Diagnosis    *DKA (diabetic ketoacidosis) (CMS HCC)    Fall    Pseudohyponatremia    Hyperkalemia    AKI (acute kidney injury) (CMS HCC)    Hyperlipidemia, unspecified    Dementia (CMS HCC)    Anticoagulant long-term use    Hypothyroidism     DKA-resolved  Pseudohyponatremia-resolved  AKI-resolved  Hyperkalemia-resolved  Fall  Long term anticoagulation      08/12/22- Off insulin drip, transitioned to basal/bolus insulin, diabetic diet. AKI and hyperkalemia resolved. We will downgrade to telemetry today. Repeat labs AM. Continue glucose monitoring.    Disposition Planning:  SNF    Tivis Ringer, [employer]  08/12/2022  [place] HOSPITALIST

## 2022-08-12 NOTE — Care Plan (Signed)
Patient remains in ICU 2 with monitoring post DKA. Patient is transferred to hospitalist service and will move to telemetry room when bed becomes available. Accuchecks are ACHS with sliding scale coverage and daily Lantus.Diabetic diet started today and patient is tolerating well. Patient is up to chair and can ambulate to bathroom with assistance. Daughter given update at bedside. Vital signs are stable on room air. IV is saline lock. Fall and skin precautions in place. Transition readiness ongoing.     Jobe Gibbon, RN

## 2022-08-12 NOTE — Transitional Care (Signed)
Jordan Buckley is an 82 year old female who presented to the ED on 08/11/21 after unwitnessed fall at nursing home [facility]). She was found and had apparently struck her head. 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 Glucose check at NH was too high to register. In ED she was found to have glucose level up to 881 mg/dL. She was also found to have evidence for DKA, as well as AKI with hyperkalemia. She was admitted to ICU and started on insulin drip per DKA protocol, with serial BMPLs/VBGs/hourly accu checks. She was able to come off insulin drip 08/11/22 around 19:30, and was transitioned to basal (Lantus 10 units nightly) and bolus (SSI coverage) insulin regimen. Started on diabetic prudent diet. CT of head was obtained, no evidence for hemorrhage, mass effect, fracture. Cervical spine CT unremarkable. 08/12/22 it was felt that she was stable to downgrade from ICU to telemetry unit.

## 2022-08-13 ENCOUNTER — Other Ambulatory Visit: Payer: Self-pay

## 2022-08-13 ENCOUNTER — Inpatient Hospital Stay (HOSPITAL_COMMUNITY): Payer: Medicare Other

## 2022-08-13 DIAGNOSIS — N390 Urinary tract infection, site not specified: Secondary | ICD-10-CM

## 2022-08-13 DIAGNOSIS — E11649 Type 2 diabetes mellitus with hypoglycemia without coma: Secondary | ICD-10-CM

## 2022-08-13 LAB — POC BLOOD GLUCOSE (RESULTS)
GLUCOSE, POC: 112 mg/dl (ref 50–500)
GLUCOSE, POC: 135 mg/dl (ref 50–500)
GLUCOSE, POC: 20 mg/dl — CL (ref 50–500)
GLUCOSE, POC: 22 mg/dl — CL (ref 50–500)
GLUCOSE, POC: 221 mg/dl (ref 50–500)
GLUCOSE, POC: 252 mg/dl (ref 50–500)
GLUCOSE, POC: 263 mg/dl (ref 50–500)
GLUCOSE, POC: 274 mg/dl (ref 50–500)
GLUCOSE, POC: 29 mg/dl — CL (ref 50–500)
GLUCOSE, POC: 408 mg/dl (ref 50–500)
GLUCOSE, POC: 455 mg/dl (ref 50–500)
GLUCOSE, POC: 78 mg/dl (ref 50–500)

## 2022-08-13 LAB — CBC
HCT: 34.6 % (ref 31.2–41.9)
HGB: 11.5 g/dL (ref 10.9–14.3)
MCH: 30.6 pg (ref 24.7–32.8)
MCHC: 33.4 g/dL (ref 32.3–35.6)
MCV: 91.7 fL (ref 75.5–95.3)
MPV: 7.1 fL — ABNORMAL LOW (ref 7.9–10.8)
PLATELETS: 219 10*3/uL (ref 140–440)
RBC: 3.77 10*6/uL (ref 3.63–4.92)
RDW: 13.7 % (ref 12.3–17.7)
WBC: 5.7 10*3/uL (ref 3.8–11.8)

## 2022-08-13 LAB — MAGNESIUM: MAGNESIUM: 2.1 mg/dL (ref 1.9–2.7)

## 2022-08-13 MED ORDER — SODIUM CHLORIDE 0.9 % INTRAVENOUS PIGGYBACK
3.0000 g | Freq: Four times a day (QID) | INTRAVENOUS | Status: DC
Start: 2022-08-13 — End: 2022-08-14
  Administered 2022-08-13 (×2): 0 g via INTRAVENOUS
  Administered 2022-08-13 – 2022-08-14 (×3): 3 g via INTRAVENOUS
  Administered 2022-08-14: 0 g via INTRAVENOUS
  Administered 2022-08-14 (×2): 3 g via INTRAVENOUS
  Administered 2022-08-14 (×2): 0 g via INTRAVENOUS
  Filled 2022-08-13 (×5): qty 8

## 2022-08-13 MED ORDER — IOHEXOL 350 MG IODINE/ML INTRAVENOUS SOLUTION
50.0000 mL | INTRAVENOUS | Status: AC
Start: 2022-08-13 — End: 2022-08-13
  Administered 2022-08-13: 80 mL via INTRAVENOUS

## 2022-08-13 MED ORDER — DEXTROSE 5 % AND 0.9 % SODIUM CHLORIDE INTRAVENOUS SOLUTION
INTRAVENOUS | Status: DC
Start: 2022-08-13 — End: 2022-08-13
  Administered 2022-08-13: 0 mL via INTRAVENOUS

## 2022-08-13 MED ORDER — INSULIN GLARGINE 100 UNITS/ML SUBQ - CHARGE BY DOSE
10.0000 [IU] | SUBCUTANEOUS | Status: AC
Start: 2022-08-13 — End: 2022-08-13
  Administered 2022-08-13: 10 [IU] via SUBCUTANEOUS
  Filled 2022-08-13: qty 10

## 2022-08-13 MED ORDER — HYDROCORTISONE SOD SUCCINATE 100 MG/2 ML VIAL WRAPPER
50.0000 mg | Freq: Four times a day (QID) | INTRAMUSCULAR | Status: DC
Start: 2022-08-13 — End: 2022-08-13
  Administered 2022-08-13: 50 mg via INTRAVENOUS
  Filled 2022-08-13: qty 2

## 2022-08-13 MED ORDER — HYDROCODONE 5 MG-ACETAMINOPHEN 325 MG TABLET
0.5000 | ORAL_TABLET | Freq: Three times a day (TID) | ORAL | Status: DC
Start: 2022-08-13 — End: 2022-08-14
  Administered 2022-08-13 – 2022-08-14 (×3): 0.5 via ORAL
  Filled 2022-08-13 (×3): qty 1

## 2022-08-13 MED ORDER — INSULIN REGULAR HUMAN 100 UNIT/ML INJECTION - CHARGE BY DOSE
10.0000 [IU] | INTRAMUSCULAR | Status: AC
Start: 2022-08-13 — End: 2022-08-13
  Administered 2022-08-13: 10 [IU] via INTRAVENOUS
  Filled 2022-08-13: qty 30

## 2022-08-13 NOTE — Nurses Notes (Signed)
Spoke with Mathews Argyle, he wants patient on tele monitor. Called telemetry room, said they would place monitor on patient.

## 2022-08-13 NOTE — Nurses Notes (Signed)
Report given to [place] at this time on [address].

## 2022-08-13 NOTE — Nurses Notes (Signed)
Charge nurse called telemetry for monitor for patient. Informed that she is not a tele patient.

## 2022-08-13 NOTE — Nurses Notes (Signed)
Transported patient to [address] at this time. Placed on bedside 02 monitoring. Patient not a telemetry patient. Belongings at bedside with patient.

## 2022-08-13 NOTE — Progress Notes (Signed)
[facility]  IP PROGRESS NOTE      Jordan Buckley, [place]  Date of Admission:  08/11/2022  Date of Birth:  1940/11/30  Date of Service:  08/13/2022    Hospital Day:  LOS: 2 days     Subjective:   Patient seen examined for follow-up of DKA, UTI, hypoglycemia.    Vital Signs:  Temp (24hrs) Max:37.1 C (98.7 F)      Temperature: 37.1 C (98.7 F)  BP (Non-Invasive): (!) 139/59  MAP (Non-Invasive): 78 mmHG  Heart Rate: 80  Respiratory Rate: 17  SpO2: 99 %    Current Medications:  acetaminophen (TYLENOL) tablet, 650 mg, Oral, Q4H PRN  albuterol continuous nebulization, 10 mg, Continuous Nebulization, Continuous  alcohol 62 % (NOZIN NASAL SANITIZER) nasal solution, 1 Each, Each Nostril, 2x/day  ampicillin-sulbactam (UNASYN) 3 g in NS 100 mL IVPB minibag, 3 g, Intravenous, Q6H  apixaban (ELIQUIS) tablet, 5 mg, Oral, 2x/day  atorvastatin (LIPITOR) tablet, 20 mg, Oral, NIGHTLY  bisacodyl (DULCOLAX) enteric coated tablet, 5 mg, Oral, Q72H PRN  busPIRone (BUSPAR) tablet, 5 mg, Oral, Daily  calcium carbonate-vitamin D3 628m (2560m-125 units per tablet, 1 Tablet, Oral, 2x/day  citalopram (CeleXA) tablet, 10 mg, Oral, Daily  D5W NS premix infusion, , Intravenous, Continuous  dextrose 50% (0.5 g/mL) injection - syringe, 12.5 g, Intravenous, Q1H PRN  dextrose 50% (0.5 g/mL) injection - syringe, 25 g, Intravenous, Q15 Min PRN   Or  dextrose 50% (0.5 g/mL) injection - syringe, 12.5 g, Intravenous, Q15 Min PRN   Or  glucagon (GLUCAGEN DIAGNOSTIC KIT) injection 1 mg, 1 mg, Subcutaneous, Q15 Min PRN   Or  glucagon (GLUCAGEN DIAGNOSTIC KIT) injection 1 mg, 1 mg, IntraMUSCULAR, Q15 Min PRN  docusate sodium (COLACE) capsule, 100 mg, Oral, Daily  ergocalciferol-vitamin D2 (DRISDOL) capsule, 50,000 Units, Oral, Q30 Days  ferrous sulfate 324 mg (65 mg elemental IRON) tablet, 324 mg, Oral, Daily before Breakfast  HYDROcodone-acetaminophen (NORCO) 5-325 mg per tablet, 0.5 Tablet, Oral,  Q8H  levothyroxine (SYNTHROID) tablet, 75 mcg, Oral, QAM  magnesium oxide (MAG-OX) 40041m240m34memental magnesium) tablet, 400 mg, Oral, 3x/day  pramipexole (MIRAPEX) tablet, 0.125 mg, Oral, 3x/day  pyridOXINE Vitamin B6 tablet, 250 mg, Oral, 2x/day        Current Orders:  Active Orders   Imaging    CT ABDOMEN PELVIS W IV CONTRAST     Frequency: ONE TIME     Number of Occurrences: 1 Occurrences     Scheduling Instructions:               Lab    CBC     Frequency: ONE TIME     Number of Occurrences: 1 Occurrences    MAGNESIUM     Frequency: ONE TIME     Number of Occurrences: 1 Occurrences   Diet    DIET DIABETIC Calorie amount: CC 2000; Do you want to initiate MNT Protocol? Yes     Frequency: All Meals     Number of Occurrences: 1 Occurrences   Nursing    APIXABAN NURSING ORDER     Frequency: UNTIL DISCONTINUED     Number of Occurrences: Until Specified     Order Comments: Nursing Instructions:    1.  Print apixaban (Eliquis) guide using the link in this order.   2.  Nurse to provide apixaban patient guide to all patients and be sure that all new starts have received education by the trained anticoagulation educator.  If additional questions, contact pharmacy.           DIABETIC HISTORY FOR INSULIN MANAGEMENT     Frequency: UNTIL DISCONTINUED     Number of Occurrences: Until Specified    DO NOT START INSULIN DRIP IF K+ IS LESS THAN 3.3. CONTACT PROVIDER FOR FURTHER INSTRUCTIONS     Frequency: UNTIL DISCONTINUED     Number of Occurrences: Until Specified     Order Comments: DO NOT START INSULIN DRIP IF K+ IS LESS THAN 3.3. CONTACT PROVIDER FOR FURTHER INSTRUCTIONS      INSULIN DRIP - NOTIFY MD PARAMETERS     Frequency: UNTIL DISCONTINUED     Number of Occurrences: Until Specified    INSULIN DRIP - NOTIFY MD PARAMETERS     Frequency: UNTIL DISCONTINUED     Number of Occurrences: Until Specified    INSULIN INFUSION MUST CONTINUE FOR AT LEAST 2 HOURS AFTER FIRST DOSE OF SUBCUTANEOUS INSULIN     Frequency: UNTIL  DISCONTINUED     Number of Occurrences: Until Specified     Order Comments: INSULIN INFUSION MUST CONTINUE FOR AT LEAST 2 HOURS AFTER FIRST DOSE OF SUBCUTANEOUS INSULIN      NOTIFY MD     Frequency: PRN     Number of Occurrences: Until Specified    NURSING TO ENTER SECONDAY ORDER FOR POINT OF CARE BLOOD GLUCOSE Q30 MIN X 1 HR, THEN Q1HR X 2 HRS  IF INSULIN ADMINISTERED     Frequency: UNTIL DISCONTINUED     Number of Occurrences: Until Specified     Order Comments: Nursing to enter secondary order for Point of Care blood glucose (POC10) Q30 min x 1hr, then Q1hr x 2hrs if insulin administered.  If blood glucose is < 100 nurse to follow hypoglycemia protocol.      NURSING: CALL MD FOR SIGNS & SYMPTOMS OF CEREBAL EDEMA. SYMPTOMS MAY INCLUDE: BRADYCARDIA, RESPIRATORY DEPRESSION, HYPERTENSION, ALTERED LEVEL OF CONSCIOUSNESS, VOMITING     Frequency: UNTIL DISCONTINUED     Number of Occurrences: Until Specified     Order Comments: NURSING: CALL MD FOR SIGNS & SYMPTOMS OF CEREBAL EDEMA. SYMPTOMS MAY INCLUDE: BRADYCARDIA, RESPIRATORY DEPRESSION, HYPERTENSION, ALTERED LEVEL OF CONSCIOUSNESS, VOMITING      NURSING: IF BLOOD GLUCOSE DECREASES BY MORE THAN 100 mg/DL IN 1 HOUR, DO NOT ADJUST THE INSULIN INFUSION RATE     Frequency: UNTIL DISCONTINUED     Number of Occurrences: Until Specified     Order Comments: NURSING: IF BLOOD GLUCOSE DECREASES BY MORE THAN 100 mg/DL IN 1 HOUR, DO NOT ADJUST THE INSULIN INFUSION RATE      NURSING: ONCE BLOOD GLUCOSE IS LESS THAN 200 FOR 1 HOUR AND THE ANION GAP IS WITHIN NORMAL LIMITS X 2, CALL PRESCRIBER TO TRANSITION TO SQ LONG-ACTING INSULIN AND VERIFY IF A DIET CAN BE INITIATED.     Frequency: UNTIL DISCONTINUED     Number of Occurrences: Until Specified     Order Comments: ONCE BLOOD GLUCOSE IS LESS THAN 200 FOR 1 HOUR AND THE ANION GAP IS WITHIN NORMAL LIMITS X 2, CALL PRESCRIBER TO TRANSITION TO SQ LONG-ACTING INSULIN AND VERIFY IF A DIET CAN BE INITIATED.      STRICT I & O Q4H      Frequency: Q4H     Number of Occurrences: Until Specified    STRICT I & O QSHIFT     Frequency: QSHIFT     Number of Occurrences: Until Specified    USE [place]  INSULIN TITRATION SCALE TO ADJUST INSULIN INFUSION FOR GLUCOSE LEVEL     Frequency: UNTIL DISCONTINUED     Number of Occurrences: Until Specified    VITAL SIGNS MULTI FREQUENCY Q 1 HOUR (X 4 THEN), Q 2 HOURS     Frequency: UNTIL DISCONTINUED     Number of Occurrences: Until Specified    WAS PATIENT ON APIXABAN PRIOR TO ADMISSION?     Frequency: UNTIL DISCONTINUED     Number of Occurrences: Until Specified   Code Status    NO  CPR     Frequency: CONTINUOUS     Number of Occurrences: Until Specified   Consult    IP CONSULT TO CARE MANAGEMENT     Frequency: ONE TIME     Number of Occurrences: 1 Occurrences     Order Comments: From [facility]      IP CONSULT TO PALLIATIVE CARE     Frequency: ONE TIME     Number of Occurrences: 1 Occurrences   Point of Care Testing    PERFORM POC WHOLE BLOOD GLUCOSE     Frequency: TID AC & HS     Number of Occurrences: Until Specified    POCT WHOLE BLOOD GLUCOSE EVERY HOUR     Frequency: Q1H     Number of Occurrences: 250 Occurrences   Medications    acetaminophen (TYLENOL) tablet     Frequency: Q4H PRN     Dose: 650 mg     Route: Oral    albuterol continuous nebulization     Frequency: Continuous     Dose: 10 mg     Route: Continuous Nebulization    alcohol 62 % (NOZIN NASAL SANITIZER) nasal solution     Frequency: 2x/day     Dose: 1 Each     Route: Each Nostril    ampicillin-sulbactam (UNASYN) 3 g in NS 100 mL IVPB minibag     Frequency: Q6H     Dose: 3 g     Route: Intravenous    apixaban (ELIQUIS) tablet     Frequency: 2x/day     Dose: 5 mg     Route: Oral    atorvastatin (LIPITOR) tablet     Frequency: NIGHTLY     Dose: 20 mg     Route: Oral    bisacodyl (DULCOLAX) enteric coated tablet     Frequency: Q72H PRN     Dose: 5 mg     Route: Oral    busPIRone (BUSPAR) tablet     Frequency: Daily     Dose: 5 mg     Route: Oral     calcium carbonate-vitamin D3 639m (2557m-125 units per tablet     Frequency: 2x/day     Dose: 1 Tablet     Route: Oral    citalopram (CeleXA) tablet     Frequency: Daily     Dose: 10 mg     Route: Oral    D5W NS premix infusion     Frequency: Continuous     Route: Intravenous    dextrose 50% (0.5 g/mL) injection - syringe     Frequency: Q1H PRN     Dose: 12.5 g     Route: Intravenous    dextrose 50% (0.5 g/mL) injection - syringe     Linked Order: Or     Frequency: Q15 Min PRN     Dose: 25 g     Route: Intravenous    dextrose 50% (0.5 g/mL) injection - syringe  Linked Order: Or     Frequency: Q15 Min PRN     Dose: 12.5 g     Route: Intravenous    docusate sodium (COLACE) capsule     Frequency: Daily     Dose: 100 mg     Route: Oral    ergocalciferol-vitamin D2 (DRISDOL) capsule     Frequency: Q30 Days     Dose: 50,000 Units     Route: Oral    ferrous sulfate 324 mg (65 mg elemental IRON) tablet     Frequency: Daily before Breakfast     Dose: 324 mg     Route: Oral    glucagon (GLUCAGEN DIAGNOSTIC KIT) injection 1 mg     Linked Order: Or     Frequency: Q15 Min PRN     Dose: 1 mg     Route: Subcutaneous    glucagon (GLUCAGEN DIAGNOSTIC KIT) injection 1 mg     Linked Order: Or     Frequency: Q15 Min PRN     Dose: 1 mg     Route: IntraMUSCULAR    HYDROcodone-acetaminophen (NORCO) 5-325 mg per tablet     Frequency: Q8H     Dose: 0.5 Tablet     Route: Oral     Order Comments: Changing time    levothyroxine (SYNTHROID) tablet     Frequency: QAM     Dose: 75 mcg     Route: Oral    magnesium oxide (MAG-OX) 449m (2492melemental magnesium) tablet     Frequency: 3x/day     Dose: 400 mg     Route: Oral    pramipexole (MIRAPEX) tablet     Frequency: 3x/day     Dose: 0.125 mg     Route: Oral    pyridOXINE Vitamin B6 tablet     Frequency: 2x/day     Dose: 250 mg     Route: Oral        Review of Systems:  Focused review of system was completed. Refer to the HPI for ROS details.     Today's Physical Exam:  Physical  Exam  Constitutional:       Appearance: Normal appearance.   Cardiovascular:      Rate and Rhythm: Normal rate and regular rhythm.   Pulmonary:      Effort: Pulmonary effort is normal.      Breath sounds: Normal breath sounds.   Abdominal:      Tenderness: There is abdominal tenderness.   Skin:     General: Skin is warm and dry.   Neurological:      Mental Status: She is alert and oriented to person, place, and time.   Psychiatric:         Mood and Affect: Mood normal.          I/O:  I/O last 24 hours:    Intake/Output Summary (Last 24 hours) at 08/13/2022 1259  Last data filed at 08/13/2022 0811  Gross per 24 hour   Intake 325 ml   Output --   Net 325 ml     I/O current shift:  01/03 0700 - 01/03 1859  In: 25 [I.V.:25]  Out: -     Problem List:  Active Hospital Problems   (*Primary Problem)    Diagnosis    *DKA (diabetic ketoacidosis) (CMS HCC)    Urinary tract infection    Fall    Pseudohyponatremia    Hyperkalemia    AKI (acute kidney injury) ([facility]  Dementia (CMS HCC)    Anticoagulant long-term use    Hypothyroidism       Assessment/ Plan:     DKA:  resolved    Hypoglycemia:  on D5w and solucortef    Suspected UTI:  on unasyn group b strep on urine culture, ct abd pending    AKI:  resolved      Carron Brazen, DO      DVT/PE Prophylaxis: Apixaban    Disposition Planning: Nursing Home     Advance Care Planning Discussed:  No

## 2022-08-13 NOTE — Care Plan (Signed)
Problem: Adult Inpatient Plan of Care  Goal: Plan of Care Review  Outcome: Ongoing (see interventions/notes)  Goal: Patient-Specific Goal (Individualized)  Outcome: Ongoing (see interventions/notes)  Flowsheets (Taken 08/13/2022 0800)  Individualized Care Needs: monitor blood sugar  Goal: Absence of Hospital-Acquired Illness or Injury  Outcome: Ongoing (see interventions/notes)  Intervention: Identify and Manage Fall Risk  Recent Flowsheet Documentation  Taken 08/13/2022 0800 by Mathis Fare, RN  Safety Promotion/Fall Prevention: activity supervised  Intervention: Prevent Skin Injury  Recent Flowsheet Documentation  Taken 08/13/2022 0800 by Mathis Fare, RN  Skin Protection: adhesive use limited  Goal: Optimal Comfort and Wellbeing  Outcome: Ongoing (see interventions/notes)  Goal: Rounds/Family Conference  Outcome: Ongoing (see interventions/notes)     Problem: Fall Injury Risk  Goal: Absence of Fall and Fall-Related Injury  Outcome: Ongoing (see interventions/notes)  Intervention: Promote Injury-Free Environment  Recent Flowsheet Documentation  Taken 08/13/2022 0800 by Mathis Fare, RN  Safety Promotion/Fall Prevention: activity supervised     Problem: Diabetic Ketoacidosis  Goal: Optimal Coping  Outcome: Ongoing (see interventions/notes)  Goal: Fluid and Electrolyte Balance with Absence of Ketosis  Outcome: Ongoing (see interventions/notes)     Problem: Skin Injury Risk Increased  Goal: Skin Health and Integrity  Outcome: Ongoing (see interventions/notes)  Intervention: Optimize Skin Protection  Recent Flowsheet Documentation  Taken 08/13/2022 0800 by Mathis Fare, RN  Pressure Reduction Techniques: Heels elevated off of the bed  Pressure Reduction Devices: Repositioning wedges/pillows utilized  Skin Protection: adhesive use limited

## 2022-08-13 NOTE — Nurses Notes (Signed)
At 0743 patient's blood sugar reading was 20. Recheck: 29. Patient alert and complaining of being cold and dizzy. Patient did not have an IV upon arrival to shift, therefore nurse started a 22G in the left arm. 1/2 amp of D50 was given at 0811. Nurse restarted patient on ordered fluids of D5W NS @ 125. Blood sugar check in 15 minutes: 22. Recheck came up to 78. Nurse gave patient other 1/2 amp of D50 at [phone]. Patient blood sugar recheck 15 minutes later was 112. Patient began to eat her breakfast and stated that she was feeling better now. IV fluids infusing. Physician called and informed. Orders placed and nurse told to check patient's blood sugar Q1hr.

## 2022-08-14 LAB — CBC
HCT: 33.6 % (ref 31.2–41.9)
HGB: 11.4 g/dL (ref 10.9–14.3)
MCH: 31.2 pg (ref 24.7–32.8)
MCHC: 33.9 g/dL (ref 32.3–35.6)
MCV: 92.2 fL (ref 75.5–95.3)
MPV: 7.2 fL — ABNORMAL LOW (ref 7.9–10.8)
PLATELETS: 209 10*3/uL (ref 140–440)
RBC: 3.64 10*6/uL (ref 3.63–4.92)
RDW: 13.6 % (ref 12.3–17.7)
WBC: 5.3 10*3/uL (ref 3.8–11.8)

## 2022-08-14 LAB — COMPREHENSIVE METABOLIC PANEL, NON-FASTING
ALBUMIN/GLOBULIN RATIO: 1.3 (ref 0.8–1.4)
ALBUMIN: 3.2 g/dL — ABNORMAL LOW (ref 3.5–5.7)
ALKALINE PHOSPHATASE: 51 U/L (ref 34–104)
ALT (SGPT): 12 U/L (ref 7–52)
ANION GAP: 4 mmol/L (ref 4–13)
AST (SGOT): 20 U/L (ref 13–39)
BILIRUBIN TOTAL: 0.5 mg/dL (ref 0.3–1.2)
BUN/CREA RATIO: 18 (ref 6–22)
BUN: 14 mg/dL (ref 7–25)
CALCIUM, CORRECTED: 9.3 mg/dL (ref 8.9–10.8)
CALCIUM: 8.7 mg/dL (ref 8.6–10.3)
CHLORIDE: 100 mmol/L (ref 98–107)
CO2 TOTAL: 29 mmol/L (ref 21–31)
CREATININE: 0.8 mg/dL (ref 0.60–1.30)
ESTIMATED GFR: 74 mL/min/{1.73_m2} (ref >59)
GLOBULIN: 2.5 — ABNORMAL LOW (ref 2.9–5.4)
GLUCOSE: 188 mg/dL — ABNORMAL HIGH (ref 74–109)
OSMOLALITY, CALCULATED: 272 mOsm/kg (ref 270–290)
POTASSIUM: 4.4 mmol/L (ref 3.5–5.1)
PROTEIN TOTAL: 5.7 g/dL — ABNORMAL LOW (ref 6.4–8.9)
SODIUM: 133 mmol/L — ABNORMAL LOW (ref 136–145)

## 2022-08-14 LAB — POC BLOOD GLUCOSE (RESULTS)
GLUCOSE, POC: 217 mg/dl (ref 50–500)
GLUCOSE, POC: 201 mg/dl (ref 50–500)
GLUCOSE, POC: 148 mg/dl (ref 50–500)
GLUCOSE, POC: 111 mg/dl (ref 50–500)
GLUCOSE, POC: 119 mg/dl (ref 50–500)
GLUCOSE, POC: 90 mg/dl (ref 50–500)
GLUCOSE, POC: 110 mg/dl (ref 50–500)
GLUCOSE, POC: 189 mg/dl (ref 50–500)
GLUCOSE, POC: 222 mg/dl (ref 50–500)

## 2022-08-14 LAB — ADULT ROUTINE BLOOD CULTURE, SET OF 2 BOTTLES (BACTERIA AND YEAST): BLOOD CULTURE, ROUTINE: NO GROWTH

## 2022-08-14 LAB — MAGNESIUM: MAGNESIUM: 1.8 mg/dL — ABNORMAL LOW (ref 1.9–2.7)

## 2022-08-14 MED ORDER — AMPICILLIN 500 MG CAPSULE
500.0000 mg | ORAL_CAPSULE | Freq: Four times a day (QID) | ORAL | 0 refills | Status: AC
Start: 2022-08-14 — End: 2022-08-19

## 2022-08-14 MED ORDER — MAGNESIUM SULFATE 1 GRAM/100 ML IN DEXTROSE 5 % INTRAVENOUS PIGGYBACK
1.0000 g | INJECTION | INTRAVENOUS | Status: AC
  Administered 2022-08-14: 1 g via INTRAVENOUS
  Administered 2022-08-14: 0 g via INTRAVENOUS
  Administered 2022-08-14: 1 g via INTRAVENOUS
  Administered 2022-08-14: 0 g via INTRAVENOUS
  Filled 2022-08-14 (×2): qty 100

## 2022-08-14 NOTE — Care Management Notes (Signed)
[facility] Management Initial Evaluation    Patient Name: Jordan Buckley  Date of Birth: [birth year]  Sex: female  Date/Time of Admission: 08/11/2022  6:22 AM  Room/Bed: 328/A  Payor: MEDICARE / Plan: MEDICARE PART A AND B / Product Type: Medicare /   Primary Care Providers:  Walker Kehr, PA-C (General)    Pharmacy Info:   [facility] [id number] - [place], [place] [address] AT [facility] [address]    [address] [name] [address]    Phone: [phone] Fax: [phone]    Hours: Not open 24 hours    [place], [place]    [address] [place] [address]    Phone: [phone] Fax: [phone]    Hours: Not open 24 hours          Emergency Contact Info:   Extended Emergency Contact Information  Primary Emergency Contact: [facility]  Mobile Phone: [phone]  Relation: Daughter  Interpreter needed? No  Secondary Emergency Contact: [place] Phone: [phone]  Relation: Daughter  Interpreter needed? No    History:   Jordan Buckley is a 82 y.o., female, admitted     Height/Weight: 162.6 cm ('5\' 4"'$ ) / 56.7 kg (125 lb)     LOS: 3 days   Admitting Diagnosis: DKA (diabetic ketoacidosis) (CMS [place]) [E11.10]    Assessment:      08/14/22 1404   Assessment Details   Assessment Type Admission   Date of Care Management Update 08/14/22   Readmission   Is this a readmission? No   Insurance Information/Type   Insurance type Medicare   Employment/Financial   Patient has Prescription Coverage?  Yes        Name of Insurance Coverage for Medications medicare   Financial Concerns none   Living Environment   Lives With facility resident   [place] to Return to Prior Arrangements yes   Home Safety   Home Assessment: No Problems Identified   Home Accessibility no concerns   Custody and Legal Status   Do you have a court appointed  guardian/conservator? No   Are you an emancipated minor? No   Custody Issues? No   Paternity Affidavit Requested? No   Care Management Plan   Discharge Planning Status initial meeting   CM will evaluate for rehabilitation potential no   Patient choice offered to patient/family yes   Form for patient choice reviewed/signed and on chart yes   Facility or Agency Preferences resident of [facility], wants to remain there   Discharge Needs Assessment   Discharge Facility/Level of Care Needs SNF Return (Medicare certified)(code 3)   Transportation Available ambulance   Referral Information   Admission Type inpatient   Address Verified verified-no changes   Arrived From nursing facility   ADVANCE DIRECTIVES   Does the Patient have an Advance Directive? Yes, Patient Does Have Advance Directive for Healthcare Treatment   Document the Substance of the Advance Directive (Required) DNR   Type of Advance Directive Completed DNR Card;Medical Power of Attorney   Copy of Advance Directives in Chart? Yes, Copy on Chart.(Specify in [place] Directive)   Name of MPOA or [place]   Phone Number of [place] or Healthcare Surrogate [phone]   Patient Requests Assistance in Having Advance Directive Notarized. N/A  Discharge Plan:  SNF Return (Medicare certified) (code 3)  CM SPOKE WITH PT REGARDING D/C GOALS. PT PLANS TO RETURN TO [facility]. PT EXPRESSED NO CONCERNS.    The patient will continue to be evaluated for developing discharge needs.     Case Manager: Lindell Noe, [place]  Phone: [phone]

## 2022-08-14 NOTE — Care Management Notes (Signed)
[facility] Management Initial Evaluation    Patient Name: Jordan Buckley  Date of Birth: 1940-12-29  Sex: female  Date/Time of Admission: 08/11/2022  6:22 AM  Room/Bed: 328/A  Payor: MEDICARE / Plan: MEDICARE PART A AND B / Product Type: Medicare /   Primary Care Providers:  Jordan Kehr, PA-C (General)    Pharmacy Info:   [facility] [id number] - [place], [place] [address] AT [facility] [place] [address]    [address] [name] [address]    Phone: [phone] Fax: [phone]    Hours: Not open 24 hours    [place], [place]    [address] [place] [address]    Phone: [phone] Fax: [phone]    Hours: Not open 24 hours          Emergency Contact Info:   Extended Emergency Contact Information  Primary Emergency Contact: [facility]  Mobile Phone: [phone]  Relation: Daughter  Interpreter needed? No  Secondary Emergency Contact: [place] Phone: [phone]  Relation: Daughter  Interpreter needed? No    History:   Jordan Buckley is a 82 y.o., female, admitted     Height/Weight: 162.6 cm ('5\' 4"'$ ) / 56.7 kg (125 lb)     LOS: 3 days   Admitting Diagnosis: DKA (diabetic ketoacidosis) (CMS [facility]) [E11.10]    Assessment:       Discharge Plan:  SNF Return (Medicare certified) (code 3)  CM TOLD PT CLOSE TO D/C. CM PLACED UPDATES IN CAREPORT AND NOTIFIED [facility] [place], [place]. Jordan Buckley STATES THAT PT WILL NOT NEED A PT EVAL PRIOR TO D/C.     The patient will continue to be evaluated for developing discharge needs.     Case Manager: Jordan Buckley, Jordan Buckley  Phone: [phone]

## 2022-08-14 NOTE — Consults (Signed)
[facility]  Palliative Care Nurse Practitioner  Consult Note    Jordan, Buckley, 82 y.o. female  Date of Birth:  May 03, 1941  MRN: [id number]  Admit Date: 08/11/2022   Attending: Hospitalist  Lerry Paterson, [place]   Reason for Consult: Goal Coordination    Chief Complaint:  Ms. Jordan Buckley is an 82 year old female consulted to palliative care for the symptom management of chronic disease processes.     HPI:  Jordan Buckley is a 82 y.o. female who presented to the ED after sustaining an unwitnessed fall with abrasion below the left eye. On presentation, CBC was unremarkable. NA 126, K 6, HC03 13, BUN 35, Cr 1.45, glu 847, anion gap 24. LFT unremarkable. LA 1.1, B-hydroxybutyrate >8. UA reveals >1k glucose, 80 ketones. ABG reveals a metabolic acidosis. CT head unremarkable. Cervical spine CT unremarkable. The patient has been admitted for DKA, fall, pseudohyponatremia, hyperkalemia, AKI, and chronic disease management.       Subjective: Resting in bed with no acute distress.       Review of Systems:  ROS: Other than ROS in the HPI, all other systems were negative.    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/27/2021    HX BACK SURGERY      HX CATARACT REMOVAL      HX CESAREAN SECTION      HX CHOLECYSTECTOMY      HX CORONARY ARTERY BYPASS GRAFT      HX HYSTERECTOMY      KIDNEY SURGERY Left     cancerous tumor removed    LIVER RESECTION      WRIST SURGERY Left           Family Medical History:       Problem Relation (Age of Onset)    No Known Problems Mother, Father             Social Determinants of Health     Financial Resource Strain: No   Transportation Needs: None   Social Connections: Family interaction   Intimate Partner Violence: None   Housing Stability: Has housing     Social History     Socioeconomic History    Marital status: Widowed   Tobacco Use    Smoking status: Never    Smokeless tobacco: Never   Vaping Use    Vaping Use: Never used   Substance and Sexual Activity    Alcohol use: Never    Drug use: Never    Sexual activity: Not Currently     Social Determinants of Health     Social Connections: Medium Risk (08/11/2022)    Social Connections     SDOH Social Isolation: 1 or 2 times a week       Current Outpatient Medications   Medication Instructions  acetaminophen (TYLENOL) 650 mg, Oral, EVERY 4 HOURS PRN    ampicillin (PRINCIPEN) 500 mg, Oral, 4 TIMES DAILY    apixaban (ELIQUIS) 5 mg, Oral, 2 TIMES DAILY    atorvastatin (LIPITOR) 20 mg, Oral, NIGHTLY    bisacodyL (DULCOLAX) 5 mg, Oral, EVERY 72 HOURS PRN    busPIRone (BUSPAR) 5 mg Oral Tablet Take 1 Tablet (5 mg total) by mouth Once a day    calcium carbonate-vitamin D3 (CALCIUM 600 + D) 600 mg-10 mcg (400 unit) Oral Tablet 1 Tablet, Oral, 2 TIMES DAILY    dextromethorphan-guaiFENesin (ROBAFEN DM COUGH) 10-100 mg/5 mL Oral Liquid 10 mL, Oral, EVERY 4 HOURS PRN    docusate sodium (COLACE) 100 mg, Oral, DAILY    ergocalciferol, vitamin D2, (DRISDOL) 1,250 mcg (50,000 unit) Oral Capsule Take 1 Capsule (50,000 Units total) by mouth On the first of the month    escitalopram oxalate (LEXAPRO) 5 mg Oral Tablet Take 1 Tablet (5 mg total) by mouth Once a day    ferrous sulfate (FEOSOL) 325 mg, Oral, DAILY    HYDROcodone-acetaminophen (NORCO) 5-325 mg Oral Tablet Take 0.5 Tablets by mouth Three times a day    JANUVIA 100 mg Oral Tablet Take 1 Tablet (100 mg total) by mouth Once a day    levothyroxine (SYNTHROID) 75 mcg, Oral, EVERY MORNING    lidocaine (LIDODERM) 5 % Adhesive Patch, Medicated 1 Patch, Transdermal, DAILY     loperamide (IMODIUM) 2 mg, Oral, ONCE PRN, After each loose stool, not to exceed 4 caplets in 24 hours    mag hydrox/aluminum hyd/simeth (RULOX ORAL) 20 mL, Oral, EVERY 4 HOURS PRN, No more than 75m in a 24 hour period     magnesium hydroxide (MILK OF MAGNESIA) 400 mg/5 mL Oral Suspension 30 mL, Oral, DAILY PRN    magnesium oxide (MAG-OX) 400 mg, Oral, 3 TIMES DAILY    miconazole nitrate (SECURA) 2 % Cream Topical, 3 TIMES DAILY PRN    mineral oil (FLEET) Rectal Enema 133 mL, Rectal, EVERY 72 HOURS PRN    NOVOLOG U-100 INSULIN ASPART 100 unit/mL Subcutaneous Solution Inject under the skin Per sliding scale    ondansetron (ZOFRAN ODT) 4 mg, Oral, EVERY 8 HOURS PRN    pramipexole (MIRAPEX) 0.125 mg, Oral, 3 TIMES DAILY    Pyridoxine (VITAMIN B6) 250 mg, Oral, 2 TIMES DAILY      Allergies   Allergen Reactions    Cefaclor Rash    Nitrofurantoin Rash    Quinine Rash    Sulfa (Sulfonamides) Rash    Ciprofloxacin     Dulaglutide     Fluconazole     Nitrofurantoin Macrocrystal     Quinidine-Quinine Analogues ([place] Alkaloids)         Physical Exam:  Constitutional: no distress  Respiratory: Clear to auscultation bilaterally.   Cardiovascular: regular rate and rhythm  Gastrointestinal: Bowel sounds normal, non-distended  Extremities:  No edema  Integumentary:  Skin warm and dry  Neurologic:  Alert but confused    BP (!) (P) 144/61   Pulse (P) 63   Temp 36.1 C (96.9 F)   Resp (P) 18   Ht 1.626 m ('5\' 4"'$ )   Wt 56.7 kg (125 lb)   SpO2 (P) 97%   BMI 21.46 kg/m        Pain: Numeric 0     Labs:  Lab Results Today:    Results for orders placed or performed during the hospital encounter of 08/11/22 (from the past 24  hour(s))   POC BLOOD GLUCOSE (RESULTS)   Result Value Ref Range    GLUCOSE, POC 455 50 - 500 mg/dl   POC BLOOD GLUCOSE (RESULTS)   Result Value Ref Range    GLUCOSE, POC 408 50 - 500 mg/dl   POC BLOOD GLUCOSE (RESULTS)   Result Value Ref Range    GLUCOSE, POC 263 50 - 500 mg/dl   POC BLOOD GLUCOSE  (RESULTS)   Result Value Ref Range    GLUCOSE, POC 252 50 - 500 mg/dl   POC BLOOD GLUCOSE (RESULTS)   Result Value Ref Range    GLUCOSE, POC 217 50 - 500 mg/dl   CBC   Result Value Ref Range    WBC 5.3 3.8 - 11.8 x10^3/uL    RBC 3.64 3.63 - 4.92 x10^6/uL    HGB 11.4 10.9 - 14.3 g/dL    HCT 33.6 31.2 - 41.9 %    MCV 92.2 75.5 - 95.3 fL    MCH 31.2 24.7 - 32.8 pg    MCHC 33.9 32.3 - 35.6 g/dL    RDW 13.6 12.3 - 17.7 %    PLATELETS 209 140 - 440 x10^3/uL    MPV 7.2 (L) 7.9 - 10.8 fL   MAGNESIUM   Result Value Ref Range    MAGNESIUM 1.8 (L) 1.9 - 2.7 mg/dL   COMPREHENSIVE METABOLIC PANEL, NON-FASTING   Result Value Ref Range    SODIUM 133 (L) 136 - 145 mmol/L    POTASSIUM 4.4 3.5 - 5.1 mmol/L    CHLORIDE 100 98 - 107 mmol/L    CO2 TOTAL 29 21 - 31 mmol/L    ANION GAP 4 4 - 13 mmol/L    BUN 14 7 - 25 mg/dL    CREATININE 0.80 0.60 - 1.30 mg/dL    BUN/CREA RATIO 18 6 - 22    ESTIMATED GFR 74 >59 mL/min/1.47m2    ALBUMIN 3.2 (L) 3.5 - 5.7 g/dL    CALCIUM 8.7 8.6 - 10.3 mg/dL    GLUCOSE 188 (H) 74 - 109 mg/dL    ALKALINE PHOSPHATASE 51 34 - 104 U/L    ALT (SGPT) 12 7 - 52 U/L    AST (SGOT) 20 13 - 39 U/L    BILIRUBIN TOTAL 0.5 0.3 - 1.2 mg/dL    PROTEIN TOTAL 5.7 (L) 6.4 - 8.9 g/dL    ALBUMIN/GLOBULIN RATIO 1.3 0.8 - 1.4    OSMOLALITY, CALCULATED 272 270 - 290 mOsm/kg    CALCIUM, CORRECTED 9.3 8.9 - 10.8 mg/dL    GLOBULIN 2.5 (L) 2.9 - 5.4   POC BLOOD GLUCOSE (RESULTS)   Result Value Ref Range    GLUCOSE, POC 201 50 - 500 mg/dl   POC BLOOD GLUCOSE (RESULTS)   Result Value Ref Range    GLUCOSE, POC 148 50 - 500 mg/dl   POC BLOOD GLUCOSE (RESULTS)   Result Value Ref Range    GLUCOSE, POC 119 50 - 500 mg/dl   POC BLOOD GLUCOSE (RESULTS)   Result Value Ref Range    GLUCOSE, POC 111 50 - 500 mg/dl   POC BLOOD GLUCOSE (RESULTS)   Result Value Ref Range    GLUCOSE, POC 90 50 - 500 mg/dl   POC BLOOD GLUCOSE (RESULTS)   Result Value Ref Range    GLUCOSE, POC 110 50 - 500 mg/dl       Imaging Studies: Images and Reports reviewed to  current date.    Care collaborated with the following  providers:  Provider notes, labs, and imaging reviewed     ACP/GOC:  Ms. Stegman is unable to provide much historical information today during our visit and unfortunately I was unable to reach either one of her daughters to speak to more extensively.  The majority of medical information and historical information has been taken from EHR review.  Ms. Mckinstry is currently a resident at [facility] where she receives total care assistance.  She is pending discharge back to this facility after she is medically stable.  Although the patient is alert and able to communicate with me today while at the bedside she is unable to determine whether palliative care services would be beneficial following discharge.  Ms. Golomb does have designated medical power-of-attorney on file at this time.  Estill Bamberg is who I attempted to call and left a voicemail for.  Should I received a return call from Estill Bamberg we will discuss palliative care at that time.       PPS prior to hospitalization: 40%    PPS currently: 40%      Persons present and participating in discussion: Patient      Was the conversation voluntary? Yes      Patient Disposition/Discharge needs:  [facility]    ACP/GOC time:     0 minutes    Total visit time:     35 minutes including ACP/GOC time, Face to face and reviewing medical records.    Thank you for this consult.    [place], NP-C  Palliative Care Nurse Practitioner    This note may have been partially generated using M[employer] Fluency Direct system and there may be some incorrect words, spellings, and punctuation that were not noted in checking the note before saving.

## 2022-08-14 NOTE — Discharge Summary (Signed)
[facility]  DISCHARGE SUMMARY    PATIENT NAME:  Jordan Buckley, Jordan Buckley  MRN:  [id number]  DOB:  12/15/1940    ENCOUNTER DATE:  08/11/2022  INPATIENT ADMISSION DATE: 08/11/2022  DISCHARGE DATE:  08/14/2022    ATTENDING PHYSICIAN: Carron Brazen, DO  SERVICE: [facility] HOSPITALIST 3  PRIMARY CARE PHYSICIAN: Lerry Paterson, PA-C       No lay caregiver identified.    PRIMARY DISCHARGE DIAGNOSIS: DKA (diabetic ketoacidosis) (CMS [facility])  Active Hospital Problems    Diagnosis Date Noted    Principal Problem: DKA (diabetic ketoacidosis) (CMS [facility]) [E11.10] 08/11/2022    Urinary tract infection [N39.0] 08/13/2022    Fall [W19.XXXA] 08/11/2022    Pseudohyponatremia [R79.89] 08/11/2022    Hyperkalemia [E87.5] 12/23/2021    AKI (acute kidney injury) (CMS [facility]) [N17.9] 12/23/2021    Dementia (CMS [place]) [F03.90] 12/17/2021    Anticoagulant long-term use [Z79.01] 12/17/2021    Hypothyroidism [E03.9] 10/31/2020      Resolved Hospital Problems   No resolved problems to display.     Active Non-Hospital Problems    Diagnosis Date Noted    Hyperglycemia 01/17/2022    Anemia, unspecified 12/23/2021    Need for assistance with personal care 12/23/2021    Abnormal posture 12/21/2021    Other abnormalities of gait and mobility 12/21/2021    Other lack of coordination 12/21/2021    Closed compression fracture of L1 vertebra (CMS HCC) 12/20/2021    Diabetes mellitus, type 2 (CMS [place]) 12/20/2021    Age-related osteoporosis without current pathological fracture 12/20/2021    Atherosclerotic heart disease of native coronary artery without angina pectoris 12/20/2021    Dysphagia, oral phase 12/20/2021    Hypothyroidism, unspecified 12/20/2021    Muscle weakness (generalized) 12/20/2021    Wedge compression fracture of first lumbar vertebra, subsequent encounter for fracture with routine healing 12/20/2021    Vertebrogenic low back pain 12/20/2021    Alzheimer's disease, unspecified (CODE) (CMS [place]) 12/20/2021    Unspecified dementia, unspecified severity,  without behavioral disturbance, psychotic disturbance, mood disturbance, and anxiety (CMS [place]) 12/20/2021    Chest pain 12/16/2021    Back arthralgia 12/16/2021    Weakness 12/16/2021    Vitamin D deficiency 10/31/2020    Type 2 diabetes mellitus with hyperglycemia (CMS [place]) 10/31/2020    Diabetes mellitus (CMS [place]) 10/31/2020    Osteoarthrosis 04/27/2002    Osteoporosis 04/27/2002           Current Discharge Medication List        START taking these medications.        Details   ampicillin 500 mg Capsule  Commonly known as: PRINCIPEN   500 mg, Oral, 4 TIMES DAILY  Qty: 20 Capsule  Refills: 0            CONTINUE these medications - NO CHANGES were made during your visit.        Details   acetaminophen 325 mg Tablet  Commonly known as: TYLENOL   650 mg, Oral, EVERY 4 HOURS PRN  Refills: 0     apixaban 5 mg Tablet  Commonly known as: ELIQUIS   5 mg, Oral, 2 TIMES DAILY  Refills: 0     atorvastatin 20 mg Tablet  Commonly known as: LIPITOR   20 mg, Oral, NIGHTLY  Refills: 0     bisacodyL 5 mg Tablet, Delayed Release (E.C.)  Commonly known as: DULCOLAX   5 mg, Oral, EVERY 72 HOURS PRN  Refills: 0  busPIRone 5 mg Tablet  Commonly known as: BUSPAR   Take 1 Tablet (5 mg total) by mouth Once a day  Refills: 0     Calcium 600 + D 600 mg-10 mcg (400 unit) Tablet  Generic drug: calcium carbonate-vitamin D3   1 Tablet, Oral, 2 TIMES DAILY  Refills: 0     docusate sodium 100 mg Capsule  Commonly known as: COLACE   100 mg, Oral, DAILY  Qty: 1 Capsule  Refills: 0     ergocalciferol (vitamin D2) 1,250 mcg (50,000 unit) Capsule  Commonly known as: DRISDOL   Take 1 Capsule (50,000 Units total) by mouth On the first of the month  Refills: 0     escitalopram oxalate 5 mg Tablet  Commonly known as: LEXAPRO   Take 1 Tablet (5 mg total) by mouth Once a day  Refills: 0     ferrous sulfate 325 mg (65 mg iron) Tablet  Commonly known as: FEOSOL   325 mg, Oral, DAILY  Qty: 30 Tablet  Refills: 1     HYDROcodone-acetaminophen 5-325 mg  Tablet  Commonly known as: NORCO   Take 0.5 Tablets by mouth Three times a day  Refills: 0     Januvia 100 mg Tablet  Generic drug: SITagliptin phosphate   Take 1 Tablet (100 mg total) by mouth Once a day  Refills: 0     levothyroxine 75 mcg Tablet  Commonly known as: SYNTHROID   75 mcg, Oral, EVERY MORNING  Refills: 0     lidocaine 5 % Adhesive Patch, Medicated  Commonly known as: LIDODERM   1 Patch, Transdermal, DAILY  Refills: 0     loperamide 2 mg Capsule  Commonly known as: IMODIUM   2 mg, Oral, ONCE PRN, After each loose stool, not to exceed 4 caplets in 24 hours  Refills: 0     magnesium oxide 400 mg Tablet  Commonly known as: MAG-OX   400 mg, Oral, 3 TIMES DAILY  Refills: 0     miconazole nitrate 2 % Cream  Commonly known as: SECURA   Topical, 3 TIMES DAILY PRN  Refills: 0     Milk of Magnesia 400 mg/5 mL Suspension  Generic drug: magnesium hydroxide   30 mL, Oral, DAILY PRN  Refills: 0     mineral oil Enema  Commonly known as: FLEET   133 mL, Rectal, EVERY 72 HOURS PRN  Refills: 0     NovoLOG U-100 Insulin aspart 100 unit/mL Solution  Generic drug: insulin aspart U-100   Inject under the skin Per sliding scale  Refills: 0     ondansetron 4 mg Tablet, Rapid Dissolve  Commonly known as: ZOFRAN ODT   4 mg, Oral, EVERY 8 HOURS PRN  Qty: 12 Tablet  Refills: 0     pramipexole 0.125 mg Tablet  Commonly known as: MIRAPEX   0.125 mg, Oral, 3 TIMES DAILY  Refills: 0     Pyridoxine 250 mg Tablet  Commonly known as: VITAMIN B6   250 mg, Oral, 2 TIMES DAILY  Refills: 0     Robafen DM Cough 10-100 mg/5 mL Liquid  Generic drug: dextromethorphan-guaiFENesin   10 mL, Oral, EVERY 4 HOURS PRN  Refills: 0     RULOX ORAL   20 mL, Oral, EVERY 4 HOURS PRN, No more than 79m in a 24 hour period   Refills: 0            Discharge med list refreshed?  YES  Allergies   Allergen Reactions    Cefaclor Rash    Nitrofurantoin Rash    Quinine Rash    Sulfa (Sulfonamides) Rash    Ciprofloxacin     Dulaglutide     Fluconazole      Nitrofurantoin Macrocrystal     Quinidine-Quinine Analogues ([place] Alkaloids)      HOSPITAL PROCEDURE(S):   No orders of the defined types were placed in this encounter.      REASON FOR HOSPITALIZATION AND HOSPITAL COURSE   BRIEF HPI:  This is a 82 y.o., female admitted for DKA, UTI  BRIEF HOSPITAL NARRATIVE:  See H And P for admission details    DKA:  Patient initially placed in CCU for insulin drip and IV fluid therapy.  She was  transitioned to floor on 1/2.  Patient was started on insulin sliding scale and was given and diabetic diet.  She became hypoglycemic on 01/03 in the morning, she was placed on D5 and Solu-Cortef.  Blood sugars improved    Hypoglycemia likely secondary to urinary tract infection:  Patient is started on Unasyn for group B strep urinary tract infection, she will continue with p.o. antibiotics upon discharge.    Group B strep UTI: Abdominal imaging unremarkable, continue with antibiotic outpatient      CONDITION ON DISCHARGE:  A. Ambulation: Up with assistance only  B. Self-care Ability: With partial assistance  C. Cognitive Status Alert and Oriented x 3  D. Code status at discharge:       LINES/DRAINS/WOUNDS AT DISCHARGE:   Patient Lines/Drains/Airways Status       Active Line / Dialysis Catheter / Dialysis Graft / Drain / Airway / Wound       Name Placement date Placement time Site Days    Peripheral IV Left Basilic  (medial side of arm) 08/13/22  0810  -- 1                    DISCHARGE DISPOSITION:  Home discharge  DISCHARGE INSTRUCTIONS:  Post-Discharge Follow Up Appointments       Follow up with Lerry Paterson, PA-C in 1 week(s)    Phone: [phone]    Where: [place], [place] [address]      Thursday Aug 28, 2022    Return Patient Visit with Pia Mau, APRN,NP-C at 10:30 AM      Cardiology [place], [facility], [place] [address]  [phone]           No discharge procedures on file.       Carron Brazen, DO    Copies sent to Care Team         Relationship Specialty Notifications Start End    Lerry Paterson, [place] PCP - General PHYSICIAN ASSISTANT  10/29/21     Phone: [phone] Fax: [phone]         [address] [address]            Referring providers can utilize [email or web address] to access their referred [place] patient's information.

## 2022-08-14 NOTE — Nurses Notes (Signed)
Report called to [facility]. Report given to Carita Pian, RN.

## 2022-08-14 NOTE — Nurses Notes (Signed)
EMS arrived to transfer pt to [facility]. Pt IV and telemetry reviewed. Pt discharge reviewed with pt and instructions were sent with pt. No questions at this time.

## 2022-08-16 LAB — ADULT ROUTINE BLOOD CULTURE, SET OF 2 BOTTLES (BACTERIA AND YEAST): BLOOD CULTURE, ROUTINE: NO GROWTH

## 2022-08-19 ENCOUNTER — Other Ambulatory Visit: Payer: Self-pay

## 2022-08-19 ENCOUNTER — Other Ambulatory Visit: Payer: Medicare Other | Attending: FAMILY PRACTICE | Admitting: FAMILY PRACTICE

## 2022-08-19 DIAGNOSIS — R52 Pain, unspecified: Secondary | ICD-10-CM | POA: Insufficient documentation

## 2022-08-19 LAB — URINALYSIS, MACROSCOPIC
BILIRUBIN: NEGATIVE mg/dL
BLOOD: NEGATIVE mg/dL
GLUCOSE: 300 mg/dL — AB
KETONES: NEGATIVE mg/dL
LEUKOCYTES: 75 WBCs/uL — AB
NITRITE: NEGATIVE
PH: 6 (ref 5.0–9.0)
PROTEIN: NEGATIVE mg/dL
SPECIFIC GRAVITY: 1.023 (ref 1.002–1.030)
UROBILINOGEN: NORMAL mg/dL

## 2022-08-19 LAB — URINALYSIS, MICROSCOPIC
RBCS: 3 /hpf (ref <4)
SQUAMOUS EPITHELIAL: 1 /hpf (ref <28)
WBCS: 6 /hpf — ABNORMAL HIGH (ref <6)

## 2022-08-22 LAB — URINE CULTURE,ROUTINE: URINE CULTURE: >100000 — AB

## 2022-08-26 ENCOUNTER — Other Ambulatory Visit: Payer: Self-pay

## 2022-08-26 ENCOUNTER — Other Ambulatory Visit: Payer: Medicare Other | Attending: FAMILY PRACTICE | Admitting: FAMILY PRACTICE

## 2022-08-26 DIAGNOSIS — R52 Pain, unspecified: Secondary | ICD-10-CM | POA: Insufficient documentation

## 2022-08-26 DIAGNOSIS — R300 Dysuria: Secondary | ICD-10-CM

## 2022-08-26 LAB — URINALYSIS, MICROSCOPIC
BACTERIA: NEGATIVE /hpf
NON-SQUAMOUS EPITHELIAL CELLS URINE: 1 /hpf (ref <=1)
RBCS: 15 /hpf — ABNORMAL HIGH (ref <4)
SQUAMOUS EPITHELIAL: 1 /hpf (ref <28)
WBCS: 123 /hpf — ABNORMAL HIGH (ref <6)

## 2022-08-26 LAB — URINALYSIS, MACROSCOPIC
BILIRUBIN: NEGATIVE mg/dL
BLOOD: 0.03 mg/dL
GLUCOSE: 70 mg/dL — AB
KETONES: NEGATIVE mg/dL
LEUKOCYTES: 500 WBCs/uL — AB
NITRITE: NEGATIVE
PH: 6 (ref 5.0–9.0)
PROTEIN: 20 mg/dL
SPECIFIC GRAVITY: 1.021 (ref 1.002–1.030)
UROBILINOGEN: NORMAL mg/dL

## 2022-08-28 ENCOUNTER — Encounter (INDEPENDENT_AMBULATORY_CARE_PROVIDER_SITE_OTHER): Payer: Self-pay | Admitting: Family

## 2022-08-29 LAB — URINE CULTURE,ROUTINE: URINE CULTURE: >100000 — AB

## 2022-09-12 ENCOUNTER — Other Ambulatory Visit: Payer: Medicare Other | Attending: FAMILY PRACTICE | Admitting: FAMILY PRACTICE

## 2022-09-12 DIAGNOSIS — Z79899 Other long term (current) drug therapy: Secondary | ICD-10-CM | POA: Insufficient documentation

## 2022-09-12 LAB — CBC
HCT: 34.9 % (ref 31.2–41.9)
HGB: 11.9 g/dL (ref 10.9–14.3)
MCH: 31.1 pg (ref 24.7–32.8)
MCHC: 34 g/dL (ref 32.3–35.6)
MCV: 91.6 fL (ref 75.5–95.3)
MPV: 7.4 fL — ABNORMAL LOW (ref 7.9–10.8)
PLATELETS: 231 10*3/uL (ref 140–440)
RBC: 3.81 10*6/uL (ref 3.63–4.92)
RDW: 14.4 % (ref 12.3–17.7)
WBC: 3.9 10*3/uL (ref 3.8–11.8)

## 2022-09-26 LAB — POC BLOOD GLUCOSE (RESULTS): GLUCOSE, POC: 412 mg/dl — ABNORMAL HIGH (ref 70–100)

## 2022-10-13 ENCOUNTER — Other Ambulatory Visit: Payer: Medicare Other | Attending: FAMILY PRACTICE | Admitting: FAMILY PRACTICE

## 2022-10-13 DIAGNOSIS — Z79899 Other long term (current) drug therapy: Secondary | ICD-10-CM | POA: Insufficient documentation

## 2022-10-13 LAB — CBC
HCT: 36 % (ref 31.2–41.9)
HGB: 11.9 g/dL (ref 10.9–14.3)
MCH: 31 pg (ref 24.7–32.8)
MCHC: 33.1 g/dL (ref 32.3–35.6)
MCV: 93.4 fL (ref 75.5–95.3)
MPV: 7.2 fL — ABNORMAL LOW (ref 7.9–10.8)
PLATELETS: 227 10*3/uL (ref 140–440)
RBC: 3.85 10*6/uL (ref 3.63–4.92)
RDW: 14.5 % (ref 12.3–17.7)
WBC: 4.4 10*3/uL (ref 3.8–11.8)

## 2022-10-16 ENCOUNTER — Other Ambulatory Visit: Payer: Self-pay

## 2022-11-04 ENCOUNTER — Other Ambulatory Visit (HOSPITAL_COMMUNITY): Payer: Self-pay | Admitting: FAMILY PRACTICE

## 2022-11-04 DIAGNOSIS — R52 Pain, unspecified: Secondary | ICD-10-CM

## 2022-11-04 LAB — URINALYSIS, MACROSCOPIC
BILIRUBIN: NEGATIVE mg/dL
BLOOD: 0.06 mg/dL — AB
GLUCOSE: NEGATIVE mg/dL
KETONES: NEGATIVE mg/dL
LEUKOCYTES: NEGATIVE WBCs/uL
PH: 7 (ref 5.0–9.0)
PROTEIN: NEGATIVE mg/dL
SPECIFIC GRAVITY: 1.008 (ref 1.002–1.030)
UROBILINOGEN: NORMAL mg/dL

## 2022-11-04 LAB — URINALYSIS, MICROSCOPIC
RBCS: 2 /hpf (ref <4)
SQUAMOUS EPITHELIAL: <1 /hpf (ref <28)
WBCS: <1 /hpf (ref <6)

## 2022-11-05 ENCOUNTER — Other Ambulatory Visit: Payer: Medicare Other | Attending: FAMILY PRACTICE | Admitting: FAMILY PRACTICE

## 2022-11-05 DIAGNOSIS — R059 Cough, unspecified: Secondary | ICD-10-CM | POA: Insufficient documentation

## 2022-11-05 DIAGNOSIS — Z1152 Encounter for screening for COVID-19: Secondary | ICD-10-CM | POA: Insufficient documentation

## 2022-11-05 LAB — COVID-19, FLU A/B, RSV RAPID BY PCR - LAB USE ONLY
INFLUENZA VIRUS TYPE A: NOT DETECTED
INFLUENZA VIRUS TYPE B: NOT DETECTED
RESPIRATORY SYNCTIAL VIRUS (RSV): NOT DETECTED
SARS-CoV-2: NOT DETECTED

## 2022-11-06 LAB — URINE CULTURE,ROUTINE: URINE CULTURE: >100000 — AB

## 2022-11-21 ENCOUNTER — Other Ambulatory Visit: Payer: Medicare Other | Attending: NURSE PRACTITIONER | Admitting: NURSE PRACTITIONER

## 2022-11-21 DIAGNOSIS — R300 Dysuria: Secondary | ICD-10-CM | POA: Insufficient documentation

## 2022-11-21 LAB — URINALYSIS, MICROSCOPIC
RBCS: <1 /hpf (ref <4)
SQUAMOUS EPITHELIAL: 1 /hpf (ref <28)
WBCS: 1 /hpf (ref <6)

## 2022-11-21 LAB — URINALYSIS, MACRO/MICRO
BILIRUBIN: NEGATIVE mg/dL
BLOOD: 0.03 mg/dL
GLUCOSE: NEGATIVE mg/dL
KETONES: NEGATIVE mg/dL
LEUKOCYTES: NEGATIVE WBCs/uL
NITRITE: NEGATIVE
PH: 6.5 (ref 5.0–9.0)
PROTEIN: NEGATIVE mg/dL
SPECIFIC GRAVITY: 1.009 (ref 1.002–1.030)
UROBILINOGEN: NORMAL mg/dL

## 2022-12-21 ENCOUNTER — Emergency Department (HOSPITAL_COMMUNITY): Payer: Medicare Other

## 2022-12-21 ENCOUNTER — Other Ambulatory Visit: Payer: Self-pay

## 2022-12-21 ENCOUNTER — Emergency Department
Admission: EM | Admit: 2022-12-21 | Discharge: 2022-12-22 | Disposition: A | Discharge status: Home / Self Care | Payer: Medicare Other | Source: Skilled Nursing Facility | Attending: Emergency Medicine | Admitting: Emergency Medicine

## 2022-12-21 DIAGNOSIS — R1011 Right upper quadrant pain: Secondary | ICD-10-CM | POA: Insufficient documentation

## 2022-12-21 DIAGNOSIS — F0390 Unspecified dementia without behavioral disturbance: Secondary | ICD-10-CM | POA: Insufficient documentation

## 2022-12-21 DIAGNOSIS — M25551 Pain in right hip: Secondary | ICD-10-CM | POA: Insufficient documentation

## 2022-12-21 LAB — CBC WITH DIFF
BASOPHIL #: 0 10*3/uL (ref 0.00–0.10)
BASOPHIL %: 1 % (ref 0–1)
EOSINOPHIL #: 0.1 10*3/uL (ref 0.00–0.50)
EOSINOPHIL %: 3 %
HCT: 37.3 % (ref 31.2–41.9)
HGB: 12.6 g/dL (ref 10.9–14.3)
LYMPHOCYTE #: 1.3 10*3/uL (ref 1.00–3.00)
LYMPHOCYTE %: 28 % (ref 16–44)
MCH: 31.6 pg (ref 24.7–32.8)
MCHC: 33.8 g/dL (ref 32.3–35.6)
MCV: 93.7 fL (ref 75.5–95.3)
MONOCYTE #: 0.3 10*3/uL (ref 0.30–1.00)
MONOCYTE %: 5 % (ref 5–13)
MPV: 6.7 fL — ABNORMAL LOW (ref 7.9–10.8)
NEUTROPHIL #: 3.1 10*3/uL (ref 1.85–7.80)
NEUTROPHIL %: 64 % (ref 43–77)
PLATELETS: 203 10*3/uL (ref 140–440)
RBC: 3.98 10*6/uL (ref 3.63–4.92)
RDW: 13.8 % (ref 12.3–17.7)
WBC: 4.9 10*3/uL (ref 3.8–11.8)

## 2022-12-21 LAB — COMPREHENSIVE METABOLIC PANEL, NON-FASTING
ALBUMIN/GLOBULIN RATIO: 1.3 (ref 0.8–1.4)
ALBUMIN: 4 g/dL (ref 3.5–5.7)
ALKALINE PHOSPHATASE: 60 U/L (ref 34–104)
ALT (SGPT): 12 U/L (ref 7–52)
ANION GAP: 9 mmol/L (ref 4–13)
AST (SGOT): 21 U/L (ref 13–39)
BILIRUBIN TOTAL: 0.7 mg/dL (ref 0.3–1.2)
BUN/CREA RATIO: 19 (ref 6–22)
BUN: 21 mg/dL (ref 7–25)
CALCIUM, CORRECTED: 9.4 mg/dL (ref 8.9–10.8)
CALCIUM: 9.4 mg/dL (ref 8.6–10.3)
CHLORIDE: 96 mmol/L — ABNORMAL LOW (ref 98–107)
CO2 TOTAL: 26 mmol/L (ref 21–31)
CREATININE: 1.1 mg/dL (ref 0.60–1.30)
ESTIMATED GFR: 50 mL/min/{1.73_m2} — ABNORMAL LOW (ref >59)
GLOBULIN: 3.2 (ref 2.9–5.4)
GLUCOSE: 344 mg/dL — ABNORMAL HIGH (ref 74–109)
OSMOLALITY, CALCULATED: 279 mOsm/kg (ref 270–290)
POTASSIUM: 4.7 mmol/L (ref 3.5–5.1)
PROTEIN TOTAL: 7.2 g/dL (ref 6.4–8.9)
SODIUM: 131 mmol/L — ABNORMAL LOW (ref 136–145)

## 2022-12-21 LAB — URINALYSIS, MACROSCOPIC
BILIRUBIN: NEGATIVE mg/dL
BLOOD: NEGATIVE mg/dL
GLUCOSE: >1000 mg/dL — AB
KETONES: NEGATIVE mg/dL
LEUKOCYTES: 25 WBCs/uL — AB
NITRITE: NEGATIVE
PH: 7 (ref 5.0–9.0)
PROTEIN: NEGATIVE mg/dL
SPECIFIC GRAVITY: 1.011 (ref 1.002–1.030)
UROBILINOGEN: NORMAL mg/dL

## 2022-12-21 LAB — URINALYSIS, MICROSCOPIC
RBCS: 1 /hpf (ref <4)
SQUAMOUS EPITHELIAL: 1 /hpf (ref <28)
TRANSITIONAL EPITHELIAL CELLS URINE: <1 /hpf (ref <6)
WBCS: 2 /hpf (ref <6)

## 2022-12-21 LAB — LACTIC ACID LEVEL W/ REFLEX FOR LEVEL >2.0: LACTIC ACID: 1.4 mmol/L (ref 0.5–2.2)

## 2022-12-21 LAB — LIPASE: LIPASE: 53 U/L (ref 11–82)

## 2022-12-21 MED ORDER — IOHEXOL 350 MG IODINE/ML INTRAVENOUS SOLUTION
60.0000 mL | INTRAVENOUS | Status: AC
Start: 2022-12-21 — End: 2022-12-21
  Administered 2022-12-21: 60 mL via INTRAVENOUS

## 2022-12-21 MED ORDER — HYDROCODONE 5 MG-ACETAMINOPHEN 325 MG TABLET
1.0000 | ORAL_TABLET | Freq: Four times a day (QID) | ORAL | 0 refills | Status: AC | PRN
Start: 2022-12-21 — End: ?

## 2022-12-21 MED ORDER — HYDROCODONE 5 MG-ACETAMINOPHEN 325 MG TABLET
ORAL_TABLET | ORAL | Status: AC
Start: 2022-12-21 — End: 2022-12-21
  Filled 2022-12-21: qty 1

## 2022-12-21 MED ORDER — HYDROCODONE 5 MG-ACETAMINOPHEN 325 MG TABLET
1.0000 | ORAL_TABLET | ORAL | Status: AC
Start: 2022-12-21 — End: 2022-12-21
  Administered 2022-12-21: 1 via ORAL

## 2022-12-21 NOTE — Discharge Instructions (Addendum)
Hydrocodone 5 mg 1/2 - 1 every 6 hours as needed for severe pain.    Return to emergency department if worse and as needed

## 2022-12-21 NOTE — ED Nurses Note (Addendum)
Pt resting in bed with eyes open. Pt is hard of hearing and is slightly confused to situation. Pt has 20G PIV in right AC with dressing that is clean, dry and intact. Awaiting CT scan. No needs identified at this time.

## 2022-12-21 NOTE — ED Attending Handoff Note (Addendum)
[facility]  Emergency Department  Course Note    Patient Name: Jordan Buckley  Age and Gender: 82 y.o. female  Date of Birth: 1941/06/18  Date of Service: 12/21/2022  MRN: [id number]  PCP: Vernell Barrier, PA-C    After a thorough discussion of the patient I have assumed care of Jordan Buckley from Dr. Joseph Art at 19:35    VS:  Temperature: 36.6 C (97.8 F)  Heart Rate: 68  Respiratory Rate: 15  BP (Non-Invasive): (!) 152/82  SpO2: 96 %    DIAGNOSTICS  Labs Ordered/Reviewed   COMPREHENSIVE METABOLIC PANEL, NON-FASTING - Abnormal; Notable for the following components:       Result Value    SODIUM 131 (*)     CHLORIDE 96 (*)     ESTIMATED GFR 50 (*)     GLUCOSE 344 (*)     All other components within normal limits    Narrative:     Estimated Glomerular Filtration Rate (eGFR) is calculated using the CKD-EPI (2021) equation, intended for patients 19 years of age and older. If gender is not documented or "unknown", there will be no eGFR calculation.     CBC WITH DIFF - Abnormal; Notable for the following components:    MPV 6.7 (*)     All other components within normal limits   URINALYSIS, MACROSCOPIC - Abnormal; Notable for the following components:    LEUKOCYTES 25 (*)     GLUCOSE >1000 (*)     All other components within normal limits   URINALYSIS, MICROSCOPIC - Abnormal; Notable for the following components:    BACTERIA Rare (*)     MUCOUS Rare (*)     All other components within normal limits   LACTIC ACID LEVEL W/ REFLEX FOR LEVEL >2.0 - Normal   LIPASE - Normal   CBC/DIFF    Narrative:     The following orders were created for panel order CBC/DIFF.  Procedure                               Abnormality         Status                     ---------                               -----------         ------                     CBC WITH BMWU[[id number]]                Abnormal            Final result                 Please view results for these tests on the individual orders.   URINALYSIS, MACROSCOPIC AND  MICROSCOPIC W/CULTURE REFLEX    Narrative:     The following orders were created for panel order URINALYSIS, MACROSCOPIC AND MICROSCOPIC W/CULTURE REFLEX.  Procedure                               Abnormality         Status                     ---------                               -----------         ------  URINALYSIS, MACROSCOPIC[[id number]]      Abnormal            Final result               URINALYSIS, MICROSCOPIC[[id number]]      Abnormal            Final result                 Please view results for these tests on the individual orders.     CT ABDOMEN PELVIS W IV CONTRAST   Final Result   No acute changes of the abdomen and pelvis.          One or more dose reduction techniques were used (e.g., Automated exposure control, adjustment of the mA and/or kV according to patient size, use of iterative reconstruction technique).         Radiologist location ID: [id number]             PENDING STUDIES AT TIME OF TRANSITION  CT abdomen pelvis    ED COURSE/MEDICAL DECISION MAKING  Medications Administered in the ED   iohexol (OMNIPAQUE 350) infusion (60 mL Intravenous Given 12/21/22 1930)          Medical Decision Making  This patient's care was assumed from Dr. Joseph Art at shift change.  The patient is into the ED from the nursing home with right sinus abdominal pain.  Labs were obtained and are unremarkable.  A CT was obtained has no acute findings.  The patient is currently abdominal pain-free.  However, the patient does complain of significant right hip pain.  Imaging of the hip joint were obtained on the abdominal CT.  These were reviewed and revealed significant hip joint narrowing.  Patient does appear to have bone-on-bone.  The patient has increased pain with rotation of the leg.  She was given hydrocodone in the ED. She will be discharged home at this time with prescription for hydrocodone.        CLINICAL IMPRESSION  Clinical Impression   Right hip pain (Primary)     DISPOSITION  Discharged        DISCHARGE MEDICATIONS  Current Discharge Medication List          Langley Adie D.O.   12/21/2022, 21:55   [facility]  Department of Emergency Medicine  [facility]    This note was partially generated using MModal Fluency Direct system, and there may be some incorrect words, spellings, and punctuation that were not noted in checking the note before saving.

## 2022-12-21 NOTE — ED Provider Notes (Signed)
[facility]  ED Primary Provider Note  Patient Name: Jordan Buckley  Patient Age: 82 y.o.  Date of Birth: January 09, 1941    Chief Complaint: Abdominal Pain and Groin Pain        History of Present Illness       Jordan Buckley is a 82 y.o. female who had concerns including Abdominal Pain and Groin Pain. This patient presents as an 82 y.o. with right side abdominal pain. The patient does have dementia which limits the exam. She was transferred to the ED from the nursing home for further evaluation.         Review of Systems     No other overt Review of Systems are noted to be positive except noted in the HPI.    { Be sure to review and modify ROS as appropriate. As of Aug 11, 2021 you are only required to have a "medically appropriate" ROS and PE for billing purposes. This help text will disappear when signing your note.:123}  Historical Data   History Reviewed This Encounter: ***History has not been "Marked as Reviewed" - Go to the top of the ED Triage Tab to document your review. Make sure history sections are updated and accurate - Click Here to jump to History for update.***      Physical Exam   ED Triage Vitals [12/21/22 1657]   BP (Non-Invasive) (!) 160/77   Heart Rate 78   Respiratory Rate 20   Temperature 36.6 C (97.8 F)   SpO2 94 %   Weight 61.2 kg (135 lb)   Height 1.626 m (5\' 4" )     {Be sure to review and modify Physical Exam as appropriate. As of Aug 11, 2021 you are only required to have a "medically appropriate" ROS and PE for billing purposes. This help text will disappear when signing your note.:123}    Nursing notes reviewed for what could be assessed. Past Medical, Surgical, and Social history reviewed.     Constitutional: NAD. Well-Developed. Well Nourished.  Head: Normocephalic, atraumatic.  Mouth/Throat: Equal facial movement.  Eyes: EOM grossly intact, conjunctiva normal.  Neck: Supple  Cardiovascular: Extremities well perfused. Not Tachycardic.  Pulmonary/Chest: No  respiratory distress.   Abdominal: Non-distended. No overt peritoneal findings. Some grimacing with palpation.   MSK: No Lower Extremity Edema.  Skin: Warm, dry.  Neuro: Appropriate, CN II-XII grossly intact.   Psych: Pleasant            Procedures      Patient Data   {Click here to open the ED Workup Activity for clinical data review *This link will automatically disappear upon signing your note*:123}  Labs Ordered/Reviewed   COMPREHENSIVE METABOLIC PANEL, NON-FASTING - Abnormal; Notable for the following components:       Result Value    SODIUM 131 (*)     CHLORIDE 96 (*)     ESTIMATED GFR 50 (*)     GLUCOSE 344 (*)     All other components within normal limits    Narrative:     Estimated Glomerular Filtration Rate (eGFR) is calculated using the CKD-EPI (2021) equation, intended for patients 38 years of age and older. If gender is not documented or "unknown", there will be no eGFR calculation.     CBC WITH DIFF - Abnormal; Notable for the following components:    MPV 6.7 (*)     All other components within normal limits   URINALYSIS, MACROSCOPIC - Abnormal; Notable for the following  components:    LEUKOCYTES 25 (*)     GLUCOSE >1000 (*)     All other components within normal limits   URINALYSIS, MICROSCOPIC - Abnormal; Notable for the following components:    BACTERIA Rare (*)     MUCOUS Rare (*)     All other components within normal limits   LACTIC ACID LEVEL W/ REFLEX FOR LEVEL >2.0 - Normal   LIPASE - Normal   CBC/DIFF    Narrative:     The following orders were created for panel order CBC/DIFF.  Procedure                               Abnormality         Status                     ---------                               -----------         ------                     CBC WITH ZOXW[[id number]]                Abnormal            Final result                 Please view results for these tests on the individual orders.   URINALYSIS, MACROSCOPIC AND MICROSCOPIC W/CULTURE REFLEX    Narrative:     The following  orders were created for panel order URINALYSIS, MACROSCOPIC AND MICROSCOPIC W/CULTURE REFLEX.  Procedure                               Abnormality         Status                     ---------                               -----------         ------                     URINALYSIS, MACROSCOPIC[[id number]]      Abnormal            Final result               URINALYSIS, MICROSCOPIC[[id number]]      Abnormal            Final result                 Please view results for these tests on the individual orders.       CT ABDOMEN PELVIS W IV CONTRAST   Final Result by Edi, Radresults In (05/12 2010)   No acute changes of the abdomen and pelvis.          One or more dose reduction techniques were used (e.g., Automated exposure control, adjustment of the mA and/or kV according to patient size, use of iterative reconstruction technique).         Radiologist location ID: [id number]  Medical Decision Making        {Be sure to fill out the MDM SmartBlock in Notewriter to the left. Do not modify this italicized text, it will disappear upon signing your note:123}  MDM      Studies Assessed and/or Ordered: ***    EKG:   This EKG interpreted by me shows:    Rate: ***    Interpretation: ***      MDM Narrative:  This patient presents with right side abdominal pain. Differential includes appendicitis, enteritis, colitis, cholecystitis. The patient was screened, with labs and CT imaging. While awaiting return of results, the care was signed-out to Dr. Hyacinth Meeker.          A 47ml/kg fluid bolus not ordered due to ***.  Please see the EMR for the total amount ordered/administered.                          Medications Administered in the ED   iohexol (OMNIPAQUE 350) infusion (60 mL Intravenous Given 12/21/22 1930)       {Disposition:38159}             Clinical Impression   Right upper quadrant abdominal pain (Primary)         Current Discharge Medication List            /R. Tobey Bride, MD, [facility]  Department of Emergency  Medicine  [facility]                {Remember to refresh your note prior to signing. Use Control + F11 or click the refresh button at the bottom of the note. This reminder text will automatically disappear when you sign your note.:123}

## 2022-12-21 NOTE — ED Nurses Note (Signed)
Report called to [facility]

## 2022-12-21 NOTE — ED Triage Notes (Signed)
RLQ and groin pain. From [facility]. Pain on palpation but no pain when resting. BLS transport.

## 2022-12-22 NOTE — ED Nurses Note (Signed)
Pt discharged back to [facility]. Pt left with copy of AVS and new Rx for Norco from provider. Pt left dept via [facility] at this time.

## 2022-12-23 ENCOUNTER — Encounter (HOSPITAL_COMMUNITY): Payer: Self-pay | Admitting: Emergency Medicine

## 2023-01-12 ENCOUNTER — Other Ambulatory Visit: Payer: Medicare Other | Attending: INTERNAL MEDICINE | Admitting: INTERNAL MEDICINE

## 2023-01-12 DIAGNOSIS — Z79899 Other long term (current) drug therapy: Secondary | ICD-10-CM | POA: Insufficient documentation

## 2023-01-12 LAB — COMPREHENSIVE METABOLIC PANEL, NON-FASTING
ALBUMIN/GLOBULIN RATIO: 1.3 (ref 0.8–1.4)
ALBUMIN: 4.3 g/dL (ref 3.5–5.7)
ALKALINE PHOSPHATASE: 55 U/L (ref 34–104)
ALT (SGPT): 16 U/L (ref 7–52)
ANION GAP: 5 mmol/L (ref 4–13)
AST (SGOT): 19 U/L (ref 13–39)
BILIRUBIN TOTAL: 1.2 mg/dL (ref 0.3–1.2)
BUN/CREA RATIO: 21 (ref 6–22)
BUN: 22 mg/dL (ref 7–25)
CALCIUM, CORRECTED: 9.8 mg/dL (ref 8.9–10.8)
CALCIUM: 10 mg/dL (ref 8.6–10.3)
CHLORIDE: 98 mmol/L (ref 98–107)
CO2 TOTAL: 32 mmol/L — ABNORMAL HIGH (ref 21–31)
CREATININE: 1.04 mg/dL (ref 0.60–1.30)
ESTIMATED GFR: 54 mL/min/{1.73_m2} — ABNORMAL LOW (ref >59)
GLOBULIN: 3.2 (ref 2.9–5.4)
GLUCOSE: 115 mg/dL — ABNORMAL HIGH (ref 74–109)
OSMOLALITY, CALCULATED: 274 mOsm/kg (ref 270–290)
POTASSIUM: 4 mmol/L (ref 3.5–5.1)
PROTEIN TOTAL: 7.5 g/dL (ref 6.4–8.9)
SODIUM: 135 mmol/L — ABNORMAL LOW (ref 136–145)

## 2023-01-12 LAB — HGA1C (HEMOGLOBIN A1C WITH EST AVG GLUCOSE): HEMOGLOBIN A1C: 11.1 % — ABNORMAL HIGH (ref 4.0–6.0)

## 2023-01-12 LAB — LIPID PANEL
CHOL/HDL RATIO: 1.7
CHOLESTEROL: 154 mg/dL (ref <200)
HDL CHOL: 89 mg/dL (ref >=40)
LDL CALC: 51 mg/dL (ref 0–100)
TRIGLYCERIDES: 68 mg/dL (ref <=150)
VLDL CALC: 14 mg/dL (ref 0–50)

## 2023-01-12 LAB — CBC
HCT: 38.1 % (ref 31.2–41.9)
HGB: 13 g/dL (ref 10.9–14.3)
MCH: 31.4 pg (ref 24.7–32.8)
MCHC: 34.1 g/dL (ref 32.3–35.6)
MCV: 91.9 fL (ref 75.5–95.3)
MPV: 7.1 fL — ABNORMAL LOW (ref 7.9–10.8)
PLATELETS: 229 10*3/uL (ref 140–440)
RBC: 4.14 10*6/uL (ref 3.63–4.92)
RDW: 13.6 % (ref 12.3–17.7)
WBC: 4.5 10*3/uL (ref 3.8–11.8)

## 2023-01-12 LAB — MAGNESIUM: MAGNESIUM: 1.8 mg/dL — ABNORMAL LOW (ref 1.9–2.7)

## 2023-01-12 LAB — VITAMIN D 25 TOTAL: VITAMIN D: 41 ng/mL (ref 30–100)

## 2023-01-12 LAB — THYROID STIMULATING HORMONE (SENSITIVE TSH): TSH: 2.105 u[IU]/mL (ref 0.450–5.330)

## 2023-01-19 ENCOUNTER — Other Ambulatory Visit: Payer: Medicare Other | Attending: INTERNAL MEDICINE | Admitting: INTERNAL MEDICINE

## 2023-01-19 DIAGNOSIS — Z79899 Other long term (current) drug therapy: Secondary | ICD-10-CM | POA: Insufficient documentation

## 2023-01-19 LAB — MAGNESIUM: MAGNESIUM: 1.9 mg/dL (ref 1.9–2.7)

## 2023-02-07 ENCOUNTER — Other Ambulatory Visit: Payer: Self-pay

## 2023-02-07 ENCOUNTER — Emergency Department (HOSPITAL_COMMUNITY): Payer: Medicare Other

## 2023-02-07 ENCOUNTER — Emergency Department
Admission: EM | Admit: 2023-02-07 | Discharge: 2023-02-07 | Disposition: A | Discharge status: Home / Self Care | Payer: Medicare Other | Attending: Emergency Medicine | Admitting: Emergency Medicine

## 2023-02-07 DIAGNOSIS — W448XXA Other foreign body entering into or through a natural orifice, initial encounter: Secondary | ICD-10-CM | POA: Insufficient documentation

## 2023-02-07 DIAGNOSIS — T17998A Other foreign object in respiratory tract, part unspecified causing other injury, initial encounter: Secondary | ICD-10-CM

## 2023-02-07 DIAGNOSIS — T17898A Other foreign object in other parts of respiratory tract causing other injury, initial encounter: Secondary | ICD-10-CM | POA: Insufficient documentation

## 2023-02-07 DIAGNOSIS — T17908A Unspecified foreign body in respiratory tract, part unspecified causing other injury, initial encounter: Secondary | ICD-10-CM

## 2023-02-07 LAB — COMPREHENSIVE METABOLIC PANEL, NON-FASTING
ALBUMIN/GLOBULIN RATIO: 1.5 — ABNORMAL HIGH (ref 0.8–1.4)
ALBUMIN: 4.3 g/dL (ref 3.5–5.7)
ALKALINE PHOSPHATASE: 57 U/L (ref 34–104)
ALT (SGPT): 15 U/L (ref 7–52)
ANION GAP: 6 mmol/L (ref 4–13)
AST (SGOT): 20 U/L (ref 13–39)
BILIRUBIN TOTAL: 0.7 mg/dL (ref 0.3–1.2)
BUN/CREA RATIO: 22 (ref 6–22)
BUN: 24 mg/dL (ref 7–25)
CALCIUM, CORRECTED: 9.4 mg/dL (ref 8.9–10.8)
CALCIUM: 9.6 mg/dL (ref 8.6–10.3)
CHLORIDE: 100 mmol/L (ref 98–107)
CO2 TOTAL: 29 mmol/L (ref 21–31)
CREATININE: 1.11 mg/dL (ref 0.60–1.30)
ESTIMATED GFR: 50 mL/min/{1.73_m2} — ABNORMAL LOW (ref >59)
GLOBULIN: 2.8 — ABNORMAL LOW (ref 2.9–5.4)
GLUCOSE: 60 mg/dL — ABNORMAL LOW (ref 74–109)
OSMOLALITY, CALCULATED: 272 mOsm/kg (ref 270–290)
POTASSIUM: 4.2 mmol/L (ref 3.5–5.1)
PROTEIN TOTAL: 7.1 g/dL (ref 6.4–8.9)
SODIUM: 135 mmol/L — ABNORMAL LOW (ref 136–145)

## 2023-02-07 LAB — CBC WITH DIFF
BASOPHIL #: 0 10*3/uL (ref 0.00–0.10)
BASOPHIL %: 1 % (ref 0–1)
EOSINOPHIL #: 0.1 10*3/uL (ref 0.00–0.50)
EOSINOPHIL %: 2 %
HCT: 35.8 % (ref 31.2–41.9)
HGB: 11.9 g/dL (ref 10.9–14.3)
LYMPHOCYTE #: 1.4 10*3/uL (ref 1.00–3.00)
LYMPHOCYTE %: 27 % (ref 16–44)
MCH: 31.3 pg (ref 24.7–32.8)
MCHC: 33.3 g/dL (ref 32.3–35.6)
MCV: 94.1 fL (ref 75.5–95.3)
MONOCYTE #: 0.3 10*3/uL (ref 0.30–1.00)
MONOCYTE %: 5 % (ref 5–13)
MPV: 6.6 fL — ABNORMAL LOW (ref 7.9–10.8)
NEUTROPHIL #: 3.5 10*3/uL (ref 1.85–7.80)
NEUTROPHIL %: 66 % (ref 43–77)
PLATELETS: 218 10*3/uL (ref 140–440)
RBC: 3.81 10*6/uL (ref 3.63–4.92)
RDW: 14.7 % (ref 12.3–17.7)
WBC: 5.3 10*3/uL (ref 3.8–11.8)

## 2023-02-07 LAB — BLUE TOP TUBE

## 2023-02-07 LAB — GOLD TOP TUBE

## 2023-02-07 LAB — LACTIC ACID LEVEL W/ REFLEX FOR LEVEL >2.0: LACTIC ACID: 1.1 mmol/L (ref 0.5–2.2)

## 2023-02-07 MED ORDER — LACTOBACILLUS ACIDOPHILUS 75 MILLION CELL-PECTIN 100 MG CAPSULE
1.0000 | ORAL_CAPSULE | Freq: Two times a day (BID) | ORAL | 0 refills | Status: AC
Start: 2023-02-07 — End: 2023-02-17

## 2023-02-07 MED ORDER — BENZONATATE 100 MG CAPSULE
100.0000 mg | ORAL_CAPSULE | Freq: Three times a day (TID) | ORAL | 0 refills | Status: AC | PRN
Start: 2023-02-07 — End: ?

## 2023-02-07 MED ORDER — CLINDAMYCIN HCL 150 MG CAPSULE
150.0000 mg | ORAL_CAPSULE | ORAL | Status: AC
Start: 2023-02-07 — End: 2023-02-07
  Administered 2023-02-07: 150 mg via ORAL

## 2023-02-07 MED ORDER — CLINDAMYCIN HCL 150 MG CAPSULE
150.0000 mg | ORAL_CAPSULE | Freq: Three times a day (TID) | ORAL | 0 refills | Status: AC
Start: 2023-02-07 — End: 2023-02-14

## 2023-02-07 MED ORDER — CLINDAMYCIN HCL 150 MG CAPSULE
ORAL_CAPSULE | ORAL | Status: AC
Start: 2023-02-07 — End: 2023-02-07
  Filled 2023-02-07: qty 1

## 2023-02-07 NOTE — ED Triage Notes (Signed)
[facility] sent for eval - pt got choked while medication and staff believe she aspirated. Right lung sounds rhonchi throughout.     [facility]: BS 67, 20g left forearm

## 2023-02-07 NOTE — Discharge Instructions (Addendum)
Clindamycin three times a day w/ food.   Probiotics twice a day to prevent diarrhea or yeast infections from the clindamycin   Tessalon Perles 3 times a day for cough  Return to the ER if there are any emergencies   See your doctor on Monday or Tuesday for rechecked  Continue all your normal medications at home as prescribed by your doctor

## 2023-02-07 NOTE — ED Provider Notes (Signed)
[facility]  ED Primary Provider Note  Patient Name: Jordan Buckley  Patient Age: 82 y.o.  Date of Birth: 04-Jun-1941    Chief Complaint: Shortness of Breath        History of Present Illness       Jordan Buckley is a 82 y.o. female who had concerns including Shortness of Breath.     HPI      chief complaint cough.      HPI  Symptoms:  The patient was noted to have shortness of breath.  However, her real problem is coughing.  The patient got choked drinking water.  She currently has no chest pain no overt shortness of breath she is just coughing.  There is no sputum.  She has no fever or chills.  There is no new confusion.  Patient has no nausea vomiting or diarrhea   Onset acute  Timing immediately prior to arrival   Mechanism:  Patient [place] when she was [place]ing.  She tried to drink a glass of water to help her coughing.  Unfortunately this resulted in her getting choked.  She thinks she may have aspirated  Support:  Patient does have supplemental oxygen at the nursing home as a PRN basis.  PMH: reviewed and listed on the chart below.   SH: reviewed and listed on the chart below.   Social History: reviewed and listed on the chart below.   Family History: reviewed and listed on the chart below.       PMHx:    Past Medical History:   Diagnosis Date    Alzheimer's dementia (CMS HCC)     Arthropathy     Asthma     Cancer (CMS HCC)     COPD (chronic obstructive pulmonary disease) (CMS HCC)     Coronary artery disease     CVA (cerebrovascular accident) (CMS HCC)     Depression     Diabetes mellitus, type 2 (CMS HCC)     Hard of hearing     HTN (hypertension)     Osteoporosis     Thyroid disease     Wears glasses     PSHx:   Past Surgical History:   Procedure Laterality Date    FIXATION KYPHOPLASTY Right 11/27/2021    HX BACK SURGERY      HX CATARACT REMOVAL      HX CESAREAN SECTION      HX CHOLECYSTECTOMY      HX CORONARY ARTERY BYPASS GRAFT      HX  HYSTERECTOMY      KIDNEY SURGERY Left     cancerous tumor removed    LIVER RESECTION      WRIST SURGERY Left        Allergies:    Allergies   Allergen Reactions    Cefaclor Rash    Nitrofurantoin Rash    Quinine Rash    Sulfa (Sulfonamides) Rash    Ciprofloxacin     Dulaglutide     Fluconazole     Nitrofurantoin Macrocrystal     Quinidine-Quinine Analogues (Cinchona Alkaloids)     Social History  Social History     Tobacco Use    Smoking status: Never    Smokeless tobacco: Never   Vaping Use    Vaping status: Never Used   Substance Use Topics    Alcohol use: Never    Drug use: Never       Family  History  Family Medical History:       Problem Relation (Age of Onset)    No Known Problems Mother, Father               Home Meds:      Prior to Admission medications    Medication Sig Start Date End Date Taking? Authorizing Provider   acetaminophen (TYLENOL) 325 mg Oral Tablet Take 2 Tablets (650 mg total) by mouth Every 4 hours as needed for Pain or Fever    Provider, Historical   apixaban (ELIQUIS) 5 mg Oral Tablet Take 1 Tablet (5 mg total) by mouth Twice daily    Provider, Historical   atorvastatin (LIPITOR) 20 mg Oral Tablet Take 1 Tablet (20 mg total) by mouth Every night    Provider, Historical   bisacodyL (DULCOLAX) 5 mg Oral Tablet, Delayed Release (E.C.) Take 1 Tablet (5 mg total) by mouth Every 72 hours as needed for Constipation    Provider, Historical   busPIRone (BUSPAR) 5 mg Oral Tablet Take 1 Tablet (5 mg total) by mouth Once a day 05/01/22   Provider, Historical   calcium carbonate-vitamin D3 (CALCIUM 600 + D) 600 mg-10 mcg (400 unit) Oral Tablet Take 1 Tablet by mouth Twice daily    Provider, Historical   dextromethorphan-guaiFENesin (ROBAFEN DM COUGH) 10-100 mg/5 mL Oral Liquid Take 10 mL by mouth Every 4 hours as needed    Provider, Historical   docusate sodium (COLACE) 100 mg Oral Capsule Take 1 Capsule (100 mg total) by mouth Once a day 12/20/21   Laurey Morale, PA-C   ergocalciferol, vitamin D2,  (DRISDOL) 1,250 mcg (50,000 unit) Oral Capsule Take 1 Capsule (50,000 Units total) by mouth On the first of the month    Provider, Historical   escitalopram oxalate (LEXAPRO) 5 mg Oral Tablet Take 1 Tablet (5 mg total) by mouth Once a day 05/01/22   Provider, Historical   ferrous sulfate (FEOSOL) 325 mg (65 mg iron) Oral Tablet Take 1 Tablet (325 mg total) by mouth Once a day 01/20/22   Linus Orn, DO   HYDROcodone-acetaminophen [facility]) 5-325 mg Oral Tablet Take 1 Tablet by mouth Every 6 hours as needed for Pain 12/21/22   Alcide Evener, DO   JANUVIA 100 mg Oral Tablet Take 1 Tablet (100 mg total) by mouth Once a day 05/01/22   Provider, Historical   levothyroxine (SYNTHROID) 75 mcg Oral Tablet Take 1 Tablet (75 mcg total) by mouth Every morning    Provider, Historical   lidocaine (LIDODERM) 5 % Adhesive Patch, Medicated Place 1 Patch (700 mg total) on the skin Once a day    Provider, Historical   loperamide (IMODIUM) 2 mg Oral Capsule Take 1 Capsule (2 mg total) by mouth Once, as needed After each loose stool, not to exceed 4 caplets in 24 hours    Provider, Historical   mag hydrox/aluminum hyd/simeth (RULOX ORAL) Take 20 mL by mouth Every 4 hours as needed No more than 80ml in a 24 hour period    Provider, Historical   magnesium hydroxide (MILK OF MAGNESIA) 400 mg/5 mL Oral Suspension Take 30 mL (2,400 mg total) by mouth Once per day as needed    Provider, Historical   magnesium oxide (MAG-OX) 400 mg Oral Tablet Take 1 Tablet (400 mg total) by mouth Three times a day    Provider, Historical   miconazole nitrate (SECURA) 2 % Cream Apply topically Three times a day as needed  Provider, Historical   mineral oil (FLEET) Rectal Enema Insert 133 mL into the rectum Every 72 hours as needed    Provider, Historical   NOVOLOG U-100 INSULIN ASPART 100 unit/mL Subcutaneous Solution Inject under the skin Per sliding scale 03/26/22   Provider, Historical   ondansetron (ZOFRAN ODT) 4 mg Oral Tablet, Rapid Dissolve Take 1  Tablet (4 mg total) by mouth Every 8 hours as needed for Nausea/Vomiting 12/03/21   Roma Kayser, APRN   pramipexole (MIRAPEX) 0.125 mg Oral Tablet Take 1 Tablet (0.125 mg total) by mouth Three times a day    Provider, Historical   Pyridoxine (VITAMIN B6) 250 mg Oral Tablet Take 1 Tablet (250 mg total) by mouth Twice daily    Provider, Historical          ROS: A total of 10 systems were reviewed by me at the time of the visit. All are negative except the HPI and the following.           Physical Exam   ED Triage Vitals [02/07/23 1320]   BP (Non-Invasive) (!) 123/56   Heart Rate 78   Respiratory Rate 18   Temperature (!) 35.9 C (96.7 F)   SpO2 (!) 89 %   Weight 61.2 kg (135 lb)   Height 1.626 m (5\' 4" )         Physical exam  Constitutional/general: The nursing notes were reviewed and agreed upon.  The vital signs were reviewed and are listed on the chart.   82 year old well-appearing female who is in no acute distress.  Patient appears to be comfortable and in no pain.  HEENT: Eyes show normal extraocular movements.  Well-hydrated oral mucosa is noted.  There is no facial trauma or abnormalities.  Neck: No midline tenderness, no meningeal signs.  Full range of motion.  No JVD.    Cardiovascular: Heart is regular rate and rhythm S1-S2 sounds were auscultated without murmur click or rub  Respiratory:  Patient is coughing significantly.  It is very difficult for me to auscultate her lungs due to the cough.  She does have rhonchi in the bilateral lower lung fields.  There is no wheeze there is no rale.  Her oxygen saturation is stable the entire time.  The measurements that were initially recorded are not valid as the recording is not accurate.    GI: Abdomen is soft non tender normal bowel sounds are auscultated.  There is no rebound tenderness or guarding  Neuro cranial nerves II through XII are intact and normal.  Patient has normal speech and normal gait.  There is no muscle weakness in any extremities.   Sensation is intact throughout.   Psych: Patient is alert and oriented person (daughter) and place.  Patient is very pleasant converse with has a euthymic affect.  There are no signs of depression or anxiety.  Skin: No rash.  No petechiae or purpura.  The skin is warm and dry without diaphoresis.  There is no pallor.  Musculoskeletal: There is no tenderness to palpation in any body region.  There is no lower extremity edema and no calf tenderness.  Negative Homan's sign bilaterally.  GU: Deferred        Procedures      Patient Data     Labs Ordered/Reviewed   COMPREHENSIVE METABOLIC PANEL, NON-FASTING - Abnormal; Notable for the following components:       Result Value    SODIUM 135 (*)     ESTIMATED GFR 50 (*)  GLUCOSE 60 (*)     ALBUMIN/GLOBULIN RATIO 1.5 (*)     GLOBULIN 2.8 (*)     All other components within normal limits    Narrative:     Estimated Glomerular Filtration Rate (eGFR) is calculated using the CKD-EPI (2021) equation, intended for patients 34 years of age and older. If gender is not documented or "unknown", there will be no eGFR calculation.     CBC WITH DIFF - Abnormal; Notable for the following components:    MPV 6.6 (*)     All other components within normal limits   LACTIC ACID LEVEL W/ REFLEX FOR LEVEL >2.0 - Normal   ADULT ROUTINE BLOOD CULTURE, SET OF 2 BOTTLES (BACTERIA AND YEAST)   ADULT ROUTINE BLOOD CULTURE, SET OF 2 BOTTLES (BACTERIA AND YEAST)   CBC/DIFF    Narrative:     The following orders were created for panel order CBC/DIFF.  Procedure                               Abnormality         Status                     ---------                               -----------         ------                     CBC WITH ZOXW[[id number]]                Abnormal            Final result                 Please view results for these tests on the individual orders.   EXTRA TUBES    Narrative:     The following orders were created for panel order EXTRA TUBES.  Procedure                                Abnormality         Status                     ---------                               -----------         ------                     BLUE TOP [id number][[id number]]                                    In process                 GOLD TOP HYQM[[id number]]                                    In process                   Please  view results for these tests on the individual orders.   BLUE TOP TUBE   GOLD TOP TUBE       BMP:   135* (06/29 1331) 100 (06/29 1331) 24 (06/29 1331)    /     60* (06/29 1331)   4.2 (06/29 1331) 29 (06/29 1331) 1.11 (06/29 1331) \              CBC:     5.3 (06/29 1331) \   11.9 (06/29 1331) /   218 (06/29 1331)      / 35.8 (06/29 1331) \             XR AP MOBILE CHEST   Final Result by Edi, Radresults In (06/29 1414)   Bibasilar linear atelectasis.            Radiologist location ID: [id number]                      Medical Decision Making  I feel that the patient likely does have an early aspiration.  However this was just water.  Therefore I do feel she is going to have a good outcome.  I do feel that she needs to have antibiotic coverage but I do think that she can go back to the nursing home.  Fortunately, the patient does have supplemental oxygen in case she needs it at the nursing home.    Amount and/or Complexity of Data Reviewed  Labs: ordered. Decision-making details documented in ED Course.  Radiology: ordered and independent interpretation performed. Decision-making details documented in ED Course.     Details: I personally viewed the chest x-ray.  I feel that this is representing an early pneumonitis likely from aspiration right lower lobe greater than left lower lobe.     Risk  Prescription drug management.  Decision regarding hospitalization.                     Medications Administered in the ED   clindamycin (CLEOCIN) capsule (150 mg Oral Given 02/07/23 1455)                 Clinical Impression   Aspiration into airway, initial encounter (Primary)           Discharged      Current  Discharge Medication List        START taking these medications    Details   benzonatate (TESSALON) 100 mg Oral Capsule Take 1 Capsule (100 mg total) by mouth Every 8 hours as needed for Cough  Qty: 12 Capsule, Refills: 0      clindamycin (CLEOCIN) 150 mg Oral Capsule Take 1 Capsule (150 mg total) by mouth Three times a day for 7 days  Qty: 21 Capsule, Refills: 0      lactobacillus acidoph-pectin (ACIDOPHILUS-PECTIN) 75 million cell -100 mg Oral Capsule Take 1 Capsule by mouth Twice daily for 10 days  Qty: 20 Capsule, Refills: 0                     _______________________________  Alphonzo Cruise, D.O.  Emergency Medicine  [facility]          This note may have been partially generated using MModal Fluency Direct system, and there may be some incorrect words, spellings, and punctuation that were not noted in checking the note before saving, though effort was made to avoid such errors.

## 2023-02-07 NOTE — ED Nurses Note (Signed)
Report called to [facility]  , daughter to transport pt back to nursing home.  Iv removed intact pressure drsng applied. Pt left department via wheelchair assist. With written instructions.

## 2023-02-12 LAB — ADULT ROUTINE BLOOD CULTURE, SET OF 2 BOTTLES (BACTERIA AND YEAST)
BLOOD CULTURE, ROUTINE: NO GROWTH
BLOOD CULTURE, ROUTINE: NO GROWTH

## 2023-02-20 ENCOUNTER — Other Ambulatory Visit: Payer: Medicare Other | Attending: INTERNAL MEDICINE | Admitting: INTERNAL MEDICINE

## 2023-02-20 DIAGNOSIS — F419 Anxiety disorder, unspecified: Secondary | ICD-10-CM | POA: Insufficient documentation

## 2023-02-20 DIAGNOSIS — E1165 Type 2 diabetes mellitus with hyperglycemia: Secondary | ICD-10-CM | POA: Insufficient documentation

## 2023-02-20 DIAGNOSIS — F0390 Unspecified dementia without behavioral disturbance: Secondary | ICD-10-CM | POA: Insufficient documentation

## 2023-02-20 DIAGNOSIS — Z1152 Encounter for screening for COVID-19: Secondary | ICD-10-CM | POA: Insufficient documentation

## 2023-02-20 LAB — COVID-19, FLU A/B, RSV RAPID BY PCR - LAB USE ONLY
INFLUENZA VIRUS TYPE A: NOT DETECTED
INFLUENZA VIRUS TYPE B: NOT DETECTED
RESPIRATORY SYNCTIAL VIRUS (RSV): NOT DETECTED
SARS-CoV-2: NOT DETECTED

## 2023-02-21 ENCOUNTER — Other Ambulatory Visit: Payer: Self-pay

## 2023-02-24 NOTE — Care Management Notes (Signed)
Referral Information  ++++++ Placed Provider #1 ++++++  Case Manager: Luiz Ochoa  Provider Type: Nursing Home/SNF  Provider Name: [facility]  Address:  [address]  [place], [place] [address]  Contact:  Admissions  Phone: [phone] x  Fax:   Fax: [phone]

## 2023-02-24 NOTE — Care Management Notes (Signed)
Referral Information  ++++++ Placed Provider #1 ++++++  Case Manager: Vicki Johnson  Provider Type: Nursing Home/SNF-Return  Provider Name: [facility]  Address:  [address]  [place], [place] [address]  Contact:  Admissions  Phone: [phone] x  Fax:   Fax: [phone]

## 2023-04-13 ENCOUNTER — Other Ambulatory Visit: Payer: Medicare Other | Attending: INTERNAL MEDICINE | Admitting: INTERNAL MEDICINE

## 2023-04-13 ENCOUNTER — Other Ambulatory Visit: Payer: Self-pay

## 2023-04-13 DIAGNOSIS — Z79899 Other long term (current) drug therapy: Secondary | ICD-10-CM | POA: Insufficient documentation

## 2023-04-13 LAB — CBC
HCT: 36.2 % (ref 31.2–41.9)
HGB: 12.3 g/dL (ref 10.9–14.3)
MCH: 31.1 pg (ref 24.7–32.8)
MCHC: 33.8 g/dL (ref 32.3–35.6)
MCV: 92.1 fL (ref 75.5–95.3)
MPV: 7 fL — ABNORMAL LOW (ref 7.9–10.8)
PLATELETS: 197 10*3/uL (ref 140–440)
RBC: 3.94 10*6/uL (ref 3.63–4.92)
RDW: 14 % (ref 12.3–17.7)
WBC: 3.6 10*3/uL — ABNORMAL LOW (ref 3.8–11.8)

## 2023-05-26 ENCOUNTER — Other Ambulatory Visit: Payer: Medicare Other | Attending: INTERNAL MEDICINE | Admitting: INTERNAL MEDICINE

## 2023-05-26 DIAGNOSIS — R252 Cramp and spasm: Secondary | ICD-10-CM | POA: Insufficient documentation

## 2023-05-26 LAB — BASIC METABOLIC PANEL
ANION GAP: 3 mmol/L — ABNORMAL LOW (ref 4–13)
BUN/CREA RATIO: 25 — ABNORMAL HIGH (ref 6–22)
BUN: 25 mg/dL (ref 7–25)
CALCIUM: 9.6 mg/dL (ref 8.6–10.3)
CHLORIDE: 96 mmol/L — ABNORMAL LOW (ref 98–107)
CO2 TOTAL: 33 mmol/L — ABNORMAL HIGH (ref 21–31)
CREATININE: 1.02 mg/dL (ref 0.60–1.30)
ESTIMATED GFR: 55 mL/min/{1.73_m2} — ABNORMAL LOW (ref >59)
GLUCOSE: 407 mg/dL — ABNORMAL HIGH (ref 74–109)
OSMOLALITY, CALCULATED: 286 mosm/kg (ref 270–290)
POTASSIUM: 4.9 mmol/L (ref 3.5–5.1)
SODIUM: 132 mmol/L — ABNORMAL LOW (ref 136–145)

## 2023-05-26 LAB — MAGNESIUM: MAGNESIUM: 1.7 mg/dL — ABNORMAL LOW (ref 1.9–2.7)

## 2023-06-03 ENCOUNTER — Other Ambulatory Visit: Payer: Medicare Other | Attending: INTERNAL MEDICINE | Admitting: INTERNAL MEDICINE

## 2023-06-03 DIAGNOSIS — R300 Dysuria: Secondary | ICD-10-CM | POA: Insufficient documentation

## 2023-06-03 LAB — URINALYSIS, MACROSCOPIC
BILIRUBIN: NEGATIVE mg/dL
BLOOD: NEGATIVE mg/dL
GLUCOSE: >1000 mg/dL — AB
KETONES: NEGATIVE mg/dL
LEUKOCYTES: 250 WBCs/uL — AB
PH: 5.5 (ref 5.0–9.0)
PROTEIN: NEGATIVE mg/dL
SPECIFIC GRAVITY: 1.024 (ref 1.002–1.030)
UROBILINOGEN: NORMAL mg/dL

## 2023-06-03 LAB — URINALYSIS, MICROSCOPIC
BACTERIA: NEGATIVE /[HPF]
RBCS: 1 /[HPF] (ref <4)
SQUAMOUS EPITHELIAL: 6 /[HPF] (ref <28)
WBCS: 10 /[HPF] — ABNORMAL HIGH (ref <6)

## 2023-06-05 LAB — URINE CULTURE,ROUTINE: URINE CULTURE: >100000 — AB

## 2023-06-06 ENCOUNTER — Other Ambulatory Visit: Payer: Self-pay

## 2023-06-09 ENCOUNTER — Other Ambulatory Visit (HOSPITAL_COMMUNITY): Payer: Medicare Other | Admitting: INTERNAL MEDICINE

## 2023-06-09 ENCOUNTER — Other Ambulatory Visit: Payer: Medicare Other | Attending: INTERNAL MEDICINE | Admitting: INTERNAL MEDICINE

## 2023-06-09 DIAGNOSIS — A0472 Enterocolitis due to Clostridium difficile, not specified as recurrent: Secondary | ICD-10-CM | POA: Insufficient documentation

## 2023-06-10 ENCOUNTER — Other Ambulatory Visit: Payer: Medicare Other | Attending: INTERNAL MEDICINE | Admitting: INTERNAL MEDICINE

## 2023-06-10 ENCOUNTER — Other Ambulatory Visit (HOSPITAL_COMMUNITY): Payer: Self-pay | Admitting: EXTERNAL

## 2023-06-10 DIAGNOSIS — Z79899 Other long term (current) drug therapy: Secondary | ICD-10-CM | POA: Insufficient documentation

## 2023-06-10 LAB — CBC WITH DIFF
BASOPHIL #: 0 10*3/uL (ref 0.00–0.10)
BASOPHIL %: 0 % (ref 0–1)
EOSINOPHIL #: 0.2 10*3/uL (ref 0.00–0.50)
EOSINOPHIL %: 3 % (ref 1–7)
HCT: 37.1 % (ref 31.2–41.9)
HGB: 12.2 g/dL (ref 10.9–14.3)
LYMPHOCYTE #: 1.1 10*3/uL (ref 1.00–3.00)
LYMPHOCYTE %: 19 % (ref 16–44)
MCH: 30.8 pg (ref 24.7–32.8)
MCHC: 32.8 g/dL (ref 32.3–35.6)
MCV: 93.9 fL (ref 75.5–95.3)
MONOCYTE #: 0.4 10*3/uL (ref 0.30–1.00)
MONOCYTE %: 7 % (ref 5–13)
MPV: 7 fL — ABNORMAL LOW (ref 7.9–10.8)
NEUTROPHIL #: 4.1 10*3/uL (ref 1.85–7.80)
NEUTROPHIL %: 71 % (ref 43–77)
PLATELETS: 213 10*3/uL (ref 140–440)
RBC: 3.95 10*6/uL (ref 3.63–4.92)
RDW: 14.6 % (ref 12.3–17.7)
WBC: 5.7 10*3/uL (ref 3.8–11.8)

## 2023-06-10 LAB — BASIC METABOLIC PANEL
ANION GAP: 2 mmol/L — ABNORMAL LOW (ref 4–13)
BUN/CREA RATIO: 21 (ref 6–22)
BUN: 20 mg/dL (ref 7–25)
CALCIUM: 9.2 mg/dL (ref 8.6–10.3)
CHLORIDE: 105 mmol/L (ref 98–107)
CO2 TOTAL: 27 mmol/L (ref 21–31)
CREATININE: 0.95 mg/dL (ref 0.60–1.30)
ESTIMATED GFR: 60 mL/min/{1.73_m2} (ref >59)
GLUCOSE: 270 mg/dL — ABNORMAL HIGH (ref 74–109)
OSMOLALITY, CALCULATED: 281 mosm/kg (ref 270–290)
POTASSIUM: 4.5 mmol/L (ref 3.5–5.1)
SODIUM: 134 mmol/L — ABNORMAL LOW (ref 136–145)

## 2023-06-10 LAB — OCCULT BLOOD, STOOL (IFOB): IFOB: POSITIVE — AB

## 2023-06-10 LAB — C. DIFFICILE PCR
C. DIFFICILE TOXIN GENE, PCR: POSITIVE — AB
PRESUMPTIVE 027/NAP1/BI: NEGATIVE

## 2023-06-10 LAB — CLOSTRIDIUM DIFFICILE TOXIN A/B: CLOSTRIDIUM DIFFICILE TOXIN A/B: NEGATIVE

## 2023-07-08 ENCOUNTER — Ambulatory Visit (INDEPENDENT_AMBULATORY_CARE_PROVIDER_SITE_OTHER): Payer: Self-pay

## 2023-07-13 ENCOUNTER — Other Ambulatory Visit: Payer: Medicare Other | Attending: INTERNAL MEDICINE | Admitting: INTERNAL MEDICINE

## 2023-07-13 DIAGNOSIS — Z79899 Other long term (current) drug therapy: Secondary | ICD-10-CM | POA: Insufficient documentation

## 2023-07-13 LAB — VITAMIN D 25 TOTAL: VITAMIN D 25, TOTAL: 42.45 ng/mL (ref 30.00–100.00)

## 2023-07-13 LAB — COMPREHENSIVE METABOLIC PANEL, NON-FASTING
ALBUMIN/GLOBULIN RATIO: 1.5 — ABNORMAL HIGH (ref 0.8–1.4)
ALBUMIN: 4.5 g/dL (ref 3.5–5.7)
ALKALINE PHOSPHATASE: 61 U/L (ref 34–104)
ALT (SGPT): 27 U/L (ref 7–52)
ANION GAP: 4 mmol/L (ref 4–13)
AST (SGOT): 27 U/L (ref 13–39)
BILIRUBIN TOTAL: 1.1 mg/dL — ABNORMAL HIGH (ref 0.3–1.0)
BUN/CREA RATIO: 22 (ref 6–22)
BUN: 20 mg/dL (ref 7–25)
CALCIUM, CORRECTED: 9.1 mg/dL (ref 8.9–10.8)
CALCIUM: 9.5 mg/dL (ref 8.6–10.3)
CHLORIDE: 95 mmol/L — ABNORMAL LOW (ref 98–107)
CO2 TOTAL: 35 mmol/L — ABNORMAL HIGH (ref 21–31)
CREATININE: 0.93 mg/dL (ref 0.60–1.30)
ESTIMATED GFR: 61 mL/min/{1.73_m2} (ref >59)
GLOBULIN: 3 (ref 2.0–3.5)
GLUCOSE: 214 mg/dL — ABNORMAL HIGH (ref 74–109)
OSMOLALITY, CALCULATED: 277 mosm/kg (ref 270–290)
POTASSIUM: 4 mmol/L (ref 3.5–5.1)
PROTEIN TOTAL: 7.5 g/dL (ref 6.4–8.9)
SODIUM: 134 mmol/L — ABNORMAL LOW (ref 136–145)

## 2023-07-13 LAB — CBC
HCT: 39.9 % (ref 31.2–41.9)
HGB: 13.3 g/dL (ref 10.9–14.3)
MCH: 31.3 pg (ref 24.7–32.8)
MCHC: 33.2 g/dL (ref 32.3–35.6)
MCV: 94.1 fL (ref 75.5–95.3)
MPV: 7.3 fL — ABNORMAL LOW (ref 7.9–10.8)
PLATELETS: 208 10*3/uL (ref 140–440)
RBC: 4.24 10*6/uL (ref 3.63–4.92)
RDW: 14.5 % (ref 12.3–17.7)
WBC: 4.4 10*3/uL (ref 3.8–11.8)

## 2023-07-13 LAB — MAGNESIUM: MAGNESIUM: 1.8 mg/dL — ABNORMAL LOW (ref 1.9–2.7)

## 2023-07-13 LAB — LIPID PANEL
CHOL/HDL RATIO: 1.6
CHOLESTEROL: 147 mg/dL (ref <200)
HDL CHOL: 94 mg/dL (ref >=40)
LDL CALC: 40 mg/dL (ref 0–100)
TRIGLYCERIDES: 63 mg/dL (ref <=150)
VLDL CALC: 13 mg/dL (ref 0–50)

## 2023-07-13 LAB — THYROID STIMULATING HORMONE (SENSITIVE TSH): TSH: 4.336 u[IU]/mL (ref 0.450–5.330)

## 2023-07-13 LAB — HGA1C (HEMOGLOBIN A1C WITH EST AVG GLUCOSE): HEMOGLOBIN A1C: 11 % — ABNORMAL HIGH (ref 4.0–6.0)

## 2023-07-28 ENCOUNTER — Other Ambulatory Visit: Payer: Medicare Other | Attending: INTERNAL MEDICINE | Admitting: INTERNAL MEDICINE

## 2023-07-28 ENCOUNTER — Other Ambulatory Visit: Payer: Self-pay

## 2023-07-28 DIAGNOSIS — J029 Acute pharyngitis, unspecified: Secondary | ICD-10-CM | POA: Insufficient documentation

## 2023-07-28 LAB — COVID-19, FLU A/B, RSV RAPID BY PCR - LAB USE ONLY
INFLUENZA VIRUS TYPE A: NOT DETECTED
INFLUENZA VIRUS TYPE B: NOT DETECTED
RESPIRATORY SYNCTIAL VIRUS (RSV): NOT DETECTED
SARS-CoV-2: NOT DETECTED

## 2023-08-02 ENCOUNTER — Other Ambulatory Visit (HOSPITAL_COMMUNITY): Payer: Medicare Other | Admitting: INTERNAL MEDICINE

## 2023-08-02 ENCOUNTER — Other Ambulatory Visit: Payer: Medicare Other | Attending: INTERNAL MEDICINE

## 2023-08-02 ENCOUNTER — Other Ambulatory Visit: Payer: Self-pay

## 2023-08-02 DIAGNOSIS — R300 Dysuria: Secondary | ICD-10-CM | POA: Insufficient documentation

## 2023-08-02 DIAGNOSIS — N390 Urinary tract infection, site not specified: Secondary | ICD-10-CM

## 2023-08-02 LAB — URINALYSIS, MICROSCOPIC
RBCS: 5 /[HPF] — ABNORMAL HIGH (ref <4)
SQUAMOUS EPITHELIAL: 2 /[HPF] (ref <28)
WBCS: 14 /[HPF] — ABNORMAL HIGH (ref <6)

## 2023-08-02 LAB — URINALYSIS, MACROSCOPIC
BILIRUBIN: NEGATIVE mg/dL
BLOOD: 0.03 mg/dL
GLUCOSE: >1000 mg/dL — AB
KETONES: NEGATIVE mg/dL
LEUKOCYTES: 75 WBCs/uL — AB
NITRITE: NEGATIVE
PH: 5.5 (ref 5.0–9.0)
PROTEIN: NEGATIVE mg/dL
SPECIFIC GRAVITY: 1.021 (ref 1.002–1.030)
UROBILINOGEN: NORMAL mg/dL

## 2023-08-03 ENCOUNTER — Other Ambulatory Visit: Payer: Medicare Other | Attending: INTERNAL MEDICINE | Admitting: INTERNAL MEDICINE

## 2023-08-03 DIAGNOSIS — Z79899 Other long term (current) drug therapy: Secondary | ICD-10-CM | POA: Insufficient documentation

## 2023-08-03 LAB — CBC WITH DIFF
BASOPHIL #: 0 10*3/uL (ref 0.00–0.10)
BASOPHIL %: 1 % (ref 0–1)
EOSINOPHIL #: 0.1 10*3/uL (ref 0.00–0.50)
EOSINOPHIL %: 3 % (ref 1–7)
HCT: 35.4 % (ref 31.2–41.9)
HGB: 11.6 g/dL (ref 10.9–14.3)
LYMPHOCYTE #: 1.1 10*3/uL (ref 1.00–3.00)
LYMPHOCYTE %: 33 % (ref 16–44)
MCH: 31.1 pg (ref 24.7–32.8)
MCHC: 32.9 g/dL (ref 32.3–35.6)
MCV: 94.6 fL (ref 75.5–95.3)
MONOCYTE #: 0.2 10*3/uL — ABNORMAL LOW (ref 0.30–1.00)
MONOCYTE %: 7 % (ref 5–13)
MPV: 6.8 fL — ABNORMAL LOW (ref 7.9–10.8)
NEUTROPHIL #: 1.9 10*3/uL (ref 1.85–7.80)
NEUTROPHIL %: 55 % (ref 43–77)
PLATELETS: 161 10*3/uL (ref 140–440)
RBC: 3.74 10*6/uL (ref 3.63–4.92)
RDW: 14.4 % (ref 12.3–17.7)
WBC: 3.4 10*3/uL — ABNORMAL LOW (ref 3.8–11.8)

## 2023-08-03 LAB — MICROALBUMIN/CREATININE RATIO, URINE, RANDOM
CREATININE RANDOM URINE: 68 mg/dL (ref 30–125)
MICROALBUMIN RANDOM URINE: 1.8 mg/dL
MICROALBUMIN/CREATININE RATIO RANDOM URINE: 26.5 mg/g

## 2023-08-03 LAB — BASIC METABOLIC PANEL
ANION GAP: 6 mmol/L (ref 4–13)
BUN/CREA RATIO: 24 — ABNORMAL HIGH (ref 6–22)
BUN: 23 mg/dL (ref 7–25)
CALCIUM: 9.2 mg/dL (ref 8.6–10.3)
CHLORIDE: 96 mmol/L — ABNORMAL LOW (ref 98–107)
CO2 TOTAL: 32 mmol/L — ABNORMAL HIGH (ref 21–31)
CREATININE: 0.95 mg/dL (ref 0.60–1.30)
ESTIMATED GFR: 60 mL/min/{1.73_m2} (ref >59)
GLUCOSE: 252 mg/dL — ABNORMAL HIGH (ref 74–109)
OSMOLALITY, CALCULATED: 281 mosm/kg (ref 270–290)
POTASSIUM: 4.3 mmol/L (ref 3.5–5.1)
SODIUM: 134 mmol/L — ABNORMAL LOW (ref 136–145)

## 2023-08-03 LAB — PARATHYROID HORMONE (PTH): PTH: 38.6 pg/mL (ref 12.0–88.0)

## 2023-08-03 LAB — PROTEIN/CREATININE RATIO, URINE, RANDOM
CREATININE RANDOM URINE: 68 mg/dL (ref 30–125)
PROTEIN RANDOM URINE: 15 mg/dL — ABNORMAL LOW (ref 50–80)
PROTEIN/CREATININE RATIO RANDOM URINE: 0.221 mg/mg (ref 0.000–200.000)

## 2023-08-03 LAB — MAGNESIUM: MAGNESIUM: 1.5 mg/dL — ABNORMAL LOW (ref 1.9–2.7)

## 2023-08-03 LAB — VITAMIN D 25 TOTAL: VITAMIN D 25, TOTAL: 36.53 ng/mL (ref 30.00–100.00)

## 2023-08-03 LAB — URIC ACID: URIC ACID: 3.4 mg/dL (ref 2.3–7.6)

## 2023-08-04 LAB — URINE CULTURE,ROUTINE: URINE CULTURE: >10000 — AB

## 2023-08-06 ENCOUNTER — Ambulatory Visit (INDEPENDENT_AMBULATORY_CARE_PROVIDER_SITE_OTHER): Payer: Medicare Other

## 2023-08-19 ENCOUNTER — Ambulatory Visit: Payer: Medicare Other | Attending: INTERNAL MEDICINE | Admitting: INTERNAL MEDICINE

## 2023-08-19 DIAGNOSIS — R69 Illness, unspecified: Secondary | ICD-10-CM | POA: Insufficient documentation

## 2023-08-21 LAB — LEGIONELLA URINE ANTIGEN: LEGIONELLA ANTIGEN: NEGATIVE

## 2023-08-31 ENCOUNTER — Ambulatory Visit (HOSPITAL_COMMUNITY): Payer: Medicare Other | Admitting: INTERNAL MEDICINE

## 2023-08-31 ENCOUNTER — Ambulatory Visit: Payer: Medicare Other | Attending: INTERNAL MEDICINE | Admitting: INTERNAL MEDICINE

## 2023-08-31 DIAGNOSIS — R300 Dysuria: Secondary | ICD-10-CM

## 2023-08-31 DIAGNOSIS — R109 Unspecified abdominal pain: Secondary | ICD-10-CM | POA: Insufficient documentation

## 2023-08-31 DIAGNOSIS — Z79899 Other long term (current) drug therapy: Secondary | ICD-10-CM | POA: Insufficient documentation

## 2023-08-31 LAB — URINALYSIS, MACROSCOPIC
BILIRUBIN: NEGATIVE mg/dL
BLOOD: 0.06 mg/dL — AB
GLUCOSE: 70 mg/dL — AB
KETONES: NEGATIVE mg/dL
LEUKOCYTES: 500 WBCs/uL — AB
NITRITE: NEGATIVE
PH: 5.5 (ref 5.0–9.0)
PROTEIN: 20 mg/dL
SPECIFIC GRAVITY: 1.023 (ref 1.002–1.030)
UROBILINOGEN: NORMAL mg/dL

## 2023-08-31 LAB — URINALYSIS, MICROSCOPIC
BACTERIA: NEGATIVE /[HPF]
HYALINE CASTS: 1 /[LPF] — ABNORMAL HIGH (ref <0)
NON-SQUAMOUS EPITHELIAL CELLS URINE: 1 /[HPF] (ref <=1)
RBCS: 8 /[HPF] — ABNORMAL HIGH (ref <4)
SQUAMOUS EPITHELIAL: 5 /[HPF] (ref <28)
WBCS: 41 /[HPF] — ABNORMAL HIGH (ref <6)

## 2023-08-31 LAB — MAGNESIUM: MAGNESIUM: 1.6 mg/dL — ABNORMAL LOW (ref 1.9–2.7)

## 2023-09-02 LAB — URINE CULTURE,ROUTINE: URINE CULTURE: >100000 — AB

## 2023-09-07 ENCOUNTER — Ambulatory Visit (INDEPENDENT_AMBULATORY_CARE_PROVIDER_SITE_OTHER): Payer: Medicare Other

## 2023-09-13 ENCOUNTER — Ambulatory Visit: Payer: Medicare Other | Attending: INTERNAL MEDICINE | Admitting: INTERNAL MEDICINE

## 2023-09-13 DIAGNOSIS — J101 Influenza due to other identified influenza virus with other respiratory manifestations: Secondary | ICD-10-CM | POA: Insufficient documentation

## 2023-09-13 DIAGNOSIS — R059 Cough, unspecified: Secondary | ICD-10-CM | POA: Insufficient documentation

## 2023-09-13 LAB — COVID-19, FLU A/B, RSV RAPID BY PCR
INFLUENZA VIRUS TYPE A: DETECTED — AB
INFLUENZA VIRUS TYPE B: NOT DETECTED
RESPIRATORY SYNCTIAL VIRUS (RSV): NOT DETECTED
SARS-CoV-2: NOT DETECTED

## 2023-09-28 ENCOUNTER — Ambulatory Visit: Payer: Medicare Other | Attending: INTERNAL MEDICINE | Admitting: INTERNAL MEDICINE

## 2023-09-28 DIAGNOSIS — J45909 Unspecified asthma, uncomplicated: Secondary | ICD-10-CM | POA: Insufficient documentation

## 2023-09-28 DIAGNOSIS — E1165 Type 2 diabetes mellitus with hyperglycemia: Secondary | ICD-10-CM | POA: Insufficient documentation

## 2023-09-30 LAB — THROAT CULTURE, BETA HEMOLYTIC STREPTOCOCCUS: THROAT CULTURE: NORMAL

## 2023-10-01 ENCOUNTER — Other Ambulatory Visit: Payer: Self-pay

## 2023-10-02 ENCOUNTER — Ambulatory Visit (INDEPENDENT_AMBULATORY_CARE_PROVIDER_SITE_OTHER): Payer: Medicare Other | Admitting: UROLOGY

## 2023-10-12 ENCOUNTER — Ambulatory Visit (HOSPITAL_COMMUNITY): Admitting: INTERNAL MEDICINE

## 2023-10-12 ENCOUNTER — Ambulatory Visit: Attending: INTERNAL MEDICINE | Admitting: INTERNAL MEDICINE

## 2023-10-12 DIAGNOSIS — Z79899 Other long term (current) drug therapy: Secondary | ICD-10-CM

## 2023-10-12 LAB — CBC
HCT: 38.3 % (ref 31.2–41.9)
HGB: 12.7 g/dL (ref 10.9–14.3)
MCH: 31.1 pg (ref 24.7–32.8)
MCHC: 33.1 g/dL (ref 32.3–35.6)
MCV: 94.1 fL (ref 75.5–95.3)
MPV: 8 fL (ref 7.9–10.8)
PLATELETS: 143 10*3/uL (ref 140–440)
RBC: 4.06 10*6/uL (ref 3.63–4.92)
RDW: 14.8 % (ref 12.3–17.7)
WBC: 3.9 10*3/uL (ref 3.8–11.8)

## 2023-10-15 ENCOUNTER — Ambulatory Visit: Attending: INTERNAL MEDICINE | Admitting: INTERNAL MEDICINE

## 2023-10-15 DIAGNOSIS — R52 Pain, unspecified: Secondary | ICD-10-CM | POA: Insufficient documentation

## 2023-10-15 DIAGNOSIS — M62838 Other muscle spasm: Secondary | ICD-10-CM | POA: Insufficient documentation

## 2023-10-15 LAB — MAGNESIUM: MAGNESIUM: 1.9 mg/dL (ref 1.9–2.7)

## 2023-10-15 LAB — CBC
HCT: 39.4 % (ref 31.2–41.9)
HGB: 13 g/dL (ref 10.9–14.3)
MCH: 31.5 pg (ref 24.7–32.8)
MCHC: 33 g/dL (ref 32.3–35.6)
MCV: 95.6 fL — ABNORMAL HIGH (ref 75.5–95.3)
MPV: 7.6 fL — ABNORMAL LOW (ref 7.9–10.8)
PLATELETS: 121 10*3/uL — ABNORMAL LOW (ref 140–440)
RBC: 4.12 10*6/uL (ref 3.63–4.92)
RDW: 15.7 % (ref 12.3–17.7)
WBC: 5.5 10*3/uL (ref 3.8–11.8)

## 2023-10-15 LAB — BASIC METABOLIC PANEL
ANION GAP: 2 mmol/L — ABNORMAL LOW (ref 4–13)
BUN/CREA RATIO: 19 (ref 6–22)
BUN: 23 mg/dL (ref 7–25)
CALCIUM: 9.3 mg/dL (ref 8.6–10.3)
CHLORIDE: 103 mmol/L (ref 98–107)
CO2 TOTAL: 29 mmol/L (ref 21–31)
CREATININE: 1.19 mg/dL (ref 0.60–1.30)
ESTIMATED GFR: 46 mL/min/{1.73_m2} — ABNORMAL LOW (ref >59)
GLUCOSE: 211 mg/dL — ABNORMAL HIGH (ref 74–109)
OSMOLALITY, CALCULATED: 278 mosm/kg (ref 270–290)
POTASSIUM: 5 mmol/L (ref 3.5–5.1)
SODIUM: 134 mmol/L — ABNORMAL LOW (ref 136–145)

## 2023-10-15 LAB — SCAN DIFFERENTIAL
PLATELET MORPHOLOGY COMMENT: ADEQUATE
RBC MORPHOLOGY COMMENT: NORMAL

## 2023-11-04 ENCOUNTER — Other Ambulatory Visit: Payer: Self-pay

## 2023-11-04 ENCOUNTER — Encounter (HOSPITAL_COMMUNITY): Payer: Self-pay | Admitting: Emergency Medicine

## 2023-11-04 ENCOUNTER — Emergency Department (HOSPITAL_COMMUNITY)

## 2023-11-04 ENCOUNTER — Emergency Department
Admission: EM | Admit: 2023-11-04 | Discharge: 2023-11-04 | Disposition: A | Discharge status: Home / Self Care | Attending: Emergency Medicine | Admitting: Emergency Medicine

## 2023-11-04 DIAGNOSIS — Z7901 Long term (current) use of anticoagulants: Secondary | ICD-10-CM

## 2023-11-04 DIAGNOSIS — I10 Essential (primary) hypertension: Secondary | ICD-10-CM | POA: Insufficient documentation

## 2023-11-04 DIAGNOSIS — Z8673 Personal history of transient ischemic attack (TIA), and cerebral infarction without residual deficits: Secondary | ICD-10-CM | POA: Insufficient documentation

## 2023-11-04 DIAGNOSIS — R233 Spontaneous ecchymoses: Secondary | ICD-10-CM | POA: Insufficient documentation

## 2023-11-04 DIAGNOSIS — E1165 Type 2 diabetes mellitus with hyperglycemia: Secondary | ICD-10-CM | POA: Insufficient documentation

## 2023-11-04 DIAGNOSIS — Z1152 Encounter for screening for COVID-19: Secondary | ICD-10-CM | POA: Insufficient documentation

## 2023-11-04 DIAGNOSIS — J449 Chronic obstructive pulmonary disease, unspecified: Secondary | ICD-10-CM | POA: Insufficient documentation

## 2023-11-04 DIAGNOSIS — I25810 Atherosclerosis of coronary artery bypass graft(s) without angina pectoris: Secondary | ICD-10-CM | POA: Insufficient documentation

## 2023-11-04 DIAGNOSIS — I2510 Atherosclerotic heart disease of native coronary artery without angina pectoris: Secondary | ICD-10-CM | POA: Insufficient documentation

## 2023-11-04 DIAGNOSIS — R9431 Abnormal electrocardiogram [ECG] [EKG]: Secondary | ICD-10-CM | POA: Insufficient documentation

## 2023-11-04 DIAGNOSIS — Z79899 Other long term (current) drug therapy: Secondary | ICD-10-CM

## 2023-11-04 DIAGNOSIS — E875 Hyperkalemia: Secondary | ICD-10-CM | POA: Insufficient documentation

## 2023-11-04 DIAGNOSIS — Z7984 Long term (current) use of oral hypoglycemic drugs: Secondary | ICD-10-CM | POA: Insufficient documentation

## 2023-11-04 DIAGNOSIS — I4892 Unspecified atrial flutter: Secondary | ICD-10-CM

## 2023-11-04 LAB — COMPREHENSIVE METABOLIC PANEL, NON-FASTING
ALBUMIN/GLOBULIN RATIO: 1.5 — ABNORMAL HIGH (ref 0.8–1.4)
ALBUMIN: 4 g/dL (ref 3.5–5.7)
ALKALINE PHOSPHATASE: 97 U/L (ref 34–104)
ALT (SGPT): 96 U/L — ABNORMAL HIGH (ref 7–52)
ANION GAP: 5 mmol/L (ref 4–13)
AST (SGOT): 143 U/L — ABNORMAL HIGH (ref 13–39)
BILIRUBIN TOTAL: 0.8 mg/dL (ref 0.3–1.0)
BUN/CREA RATIO: 22 (ref 6–22)
BUN: 24 mg/dL (ref 7–25)
CALCIUM, CORRECTED: 9 mg/dL (ref 8.9–10.8)
CALCIUM: 9 mg/dL (ref 8.6–10.3)
CHLORIDE: 97 mmol/L — ABNORMAL LOW (ref 98–107)
CO2 TOTAL: 30 mmol/L (ref 21–31)
CREATININE: 1.1 mg/dL (ref 0.60–1.30)
ESTIMATED GFR: 50 mL/min/{1.73_m2} — ABNORMAL LOW (ref >59)
GLOBULIN: 2.7 (ref 2.0–3.5)
GLUCOSE: 471 mg/dL — ABNORMAL HIGH (ref 74–109)
OSMOLALITY, CALCULATED: 290 mosm/kg (ref 270–290)
POTASSIUM: 5.3 mmol/L — ABNORMAL HIGH (ref 3.5–5.1)
PROTEIN TOTAL: 6.7 g/dL (ref 6.4–8.9)
SODIUM: 132 mmol/L — ABNORMAL LOW (ref 136–145)

## 2023-11-04 LAB — CBC WITH DIFF
BASOPHIL #: 0 10*3/uL (ref 0.00–0.10)
BASOPHIL %: 1 % (ref 0–1)
EOSINOPHIL #: 0 10*3/uL (ref 0.00–0.50)
EOSINOPHIL %: 1 % (ref 1–7)
HCT: 38.6 % (ref 31.2–41.9)
HGB: 12.3 g/dL (ref 10.9–14.3)
LYMPHOCYTE #: 0.6 10*3/uL — ABNORMAL LOW (ref 1.10–3.10)
LYMPHOCYTE %: 14 % — ABNORMAL LOW (ref 16–46)
MCH: 31 pg (ref 24.7–32.8)
MCHC: 31.9 g/dL — ABNORMAL LOW (ref 32.3–35.6)
MCV: 97.1 fL — ABNORMAL HIGH (ref 75.5–95.3)
MONOCYTE #: 0.2 10*3/uL (ref 0.20–0.90)
MONOCYTE %: 4 % (ref 4–11)
MPV: 7.6 fL — ABNORMAL LOW (ref 7.9–10.8)
NEUTROPHIL #: 3.6 10*3/uL (ref 1.90–8.20)
NEUTROPHIL %: 80 % — ABNORMAL HIGH (ref 43–77)
PLATELETS: 121 10*3/uL — ABNORMAL LOW (ref 140–440)
RBC: 3.97 10*6/uL (ref 3.63–4.92)
RDW: 15.2 % (ref 12.3–17.7)
WBC: 4.5 10*3/uL (ref 3.8–11.8)

## 2023-11-04 LAB — URINALYSIS, MACROSCOPIC
BILIRUBIN: NEGATIVE mg/dL
BLOOD: NEGATIVE mg/dL
GLUCOSE: >1000 mg/dL — AB
KETONES: NEGATIVE mg/dL
LEUKOCYTES: NEGATIVE WBCs/uL
NITRITE: NEGATIVE
PH: 6 (ref 5.0–9.0)
PROTEIN: NEGATIVE mg/dL
SPECIFIC GRAVITY: 1.022 (ref 1.002–1.030)
UROBILINOGEN: NORMAL mg/dL

## 2023-11-04 LAB — URINALYSIS, MICROSCOPIC
RBCS: 2 /HPF (ref <4)
SQUAMOUS EPITHELIAL: <1 /HPF (ref <28)
WBCS: <1 /HPF (ref <6)

## 2023-11-04 LAB — GOLD TOP TUBE

## 2023-11-04 LAB — ECG 12 LEAD
Atrial Rate: 110 {beats}/min
Calculated R Axis: 122 degrees
Calculated T Axis: 29 degrees
PR Interval: 272 ms
QRS Duration: 108 ms
QT Interval: 296 ms
QTC Calculation: 400 ms
Ventricular rate: 110 {beats}/min

## 2023-11-04 LAB — COVID-19, FLU A/B, RSV RAPID BY PCR
INFLUENZA VIRUS TYPE A: NOT DETECTED
INFLUENZA VIRUS TYPE B: NOT DETECTED
RESPIRATORY SYNCTIAL VIRUS (RSV): NOT DETECTED
SARS-CoV-2: NOT DETECTED

## 2023-11-04 LAB — LACTIC ACID LEVEL W/ REFLEX FOR LEVEL >2.0: LACTIC ACID: 1.2 mmol/L (ref 0.5–2.2)

## 2023-11-04 LAB — B-TYPE NATRIURETIC PEPTIDE (BNP),PLASMA: BNP: 255 pg/mL — ABNORMAL HIGH (ref 1–100)

## 2023-11-04 LAB — BLUE TOP TUBE

## 2023-11-04 LAB — POC BLOOD GLUCOSE (RESULTS)
GLUCOSE, POC: 499 mg/dL — ABNORMAL HIGH (ref 70–100)
GLUCOSE, POC: 471 mg/dL — ABNORMAL HIGH (ref 70–100)

## 2023-11-04 MED ORDER — INSULIN REGULAR HUMAN 100 UNIT/ML INJECTION - CHARGE BY DOSE
5.0000 [IU] | INTRAMUSCULAR | Status: AC
Start: 2023-11-04 — End: 2023-11-04
  Administered 2023-11-04: 0 [IU] via INTRAVENOUS
  Administered 2023-11-04: 5 [IU] via INTRAVENOUS

## 2023-11-04 MED ORDER — IPRATROPIUM 0.5 MG-ALBUTEROL 3 MG (2.5 MG BASE)/3 ML NEBULIZATION SOLN
3.0000 mL | INHALATION_SOLUTION | RESPIRATORY_TRACT | Status: AC
Start: 2023-11-04 — End: 2023-11-04
  Administered 2023-11-04: 3 mL via RESPIRATORY_TRACT

## 2023-11-04 MED ORDER — INSULIN REGULAR HUMAN 100 UNIT/ML INJ FOR MIXTURES -RX ADMIN UNIT ROUNDS TO NEAREST 0.01 ML
INTRAMUSCULAR | Status: AC
Start: 2023-11-04 — End: 2023-11-04
  Filled 2023-11-04: qty 5

## 2023-11-04 NOTE — ED Nurses Note (Signed)
 Patients daughter at bedside and questioning what the IV is for. This nurse advised patients daughter that patients glucose on labs was 471 and repeat at bedside was 499. Patients daughter states " no, no, no, I don't want her to have IV insulin . Her glucose fluctuates too quick. She had a steroid shot at the nursing home about thirty minutes prior to coming here so I'm sure that's what caused it. I will have her right back her for low blood sugar if you give her that insulin . I don't want her to have that". Advised Dr. Felipe Horton of this.

## 2023-11-04 NOTE — Respiratory Therapy (Signed)
 [facility] treatment held at this time for covid 19 test result and patient being in the hallway

## 2023-11-04 NOTE — ED Nurses Note (Signed)
Called report to [facility] at this time.

## 2023-11-04 NOTE — ED Provider Notes (Signed)
 [facility]  ED Primary Provider Note  Patient Name: Jordan Buckley  Patient Age: 83 y.o.  Date of Birth: 23-Oct-1940    Chief Complaint: Weakness and Shortness of Breath        History of Present Illness       Jordan Buckley is a 83 y.o. female who had concerns including Weakness and Shortness of Breath.     HPI      chief complaint weakness.      HPI   Symptoms:  Daughter is with the patient states she has had decreased level of consciousness.  She states it many times she will just stare off into space or sleep during the day.  She states this is unusual for her.  She has a rash on her right lower leg.  She does have shortness of breath and cough.  She normally wears her oxygen only at night but is now wearing it during the day also.  She denies any dysuria or frequency.  She has had decreased p.o. intake.  Onset gradual   Timing 2 to 3 days   Context: Patient is at a personal care home  Mechanism: She has had no new sick context.  However he is told to the daughter that she does have pneumonia last week in his on Augmentin .  The rash on her legs started after the Augmentin   Duration constant with intermittent waxing and waning  PMH: reviewed and listed on the chart below.   SH: reviewed and listed on the chart below.   Social History: reviewed and listed on the chart below.   Family History: reviewed and listed on the chart below.       PMHx:    Past Medical History:   Diagnosis Date    Alzheimer's dementia (CMS HCC)     Arthropathy     Asthma     Cancer (CMS HCC)     COPD (chronic obstructive pulmonary disease) (CMS HCC)     Coronary artery disease     CVA (cerebrovascular accident) (CMS HCC)     Depression     Diabetes mellitus, type 2 (CMS HCC)     Hard of hearing     HTN (hypertension)     Osteoporosis     Thyroid  disease     Wears glasses     PSHx:   Past Surgical History:   Procedure Laterality Date    FIXATION KYPHOPLASTY Right 11/27/2021    HX BACK SURGERY      HX CATARACT  REMOVAL      HX CESAREAN SECTION      HX CHOLECYSTECTOMY      HX CORONARY ARTERY BYPASS GRAFT      HX HYSTERECTOMY      KIDNEY SURGERY Left     cancerous tumor removed    LIVER RESECTION      WRIST SURGERY Left        Allergies:    Allergies   Allergen Reactions    Cefaclor Rash    Nitrofurantoin Rash    Quinine Rash    Sulfa (Sulfonamides) Rash    Ciprofloxacin     Dulaglutide     Fluconazole     Nitrofurantoin Macrocrystal     Quinidine-Quinine Analogues (Cinchona Alkaloids)     Social History  Social History     Tobacco Use    Smoking status: Never    Smokeless tobacco: Never   Vaping Use    Vaping status: Never  Used   Substance Use Topics    Alcohol  use: Never    Drug use: Never       Family History  Family Medical History:       Problem Relation (Age of Onset)    No Known Problems Mother, Father               Home Meds:      Prior to Admission medications    Medication Sig Start Date End Date Taking? Authorizing Provider   acetaminophen  (TYLENOL ) 325 mg Oral Tablet Take 2 Tablets (650 mg total) by mouth Every 4 hours as needed for Pain or Fever    Provider, Historical   apixaban  (ELIQUIS ) 5 mg Oral Tablet Take 1 Tablet (5 mg total) by mouth Twice daily    Provider, Historical   atorvastatin  (LIPITOR) 20 mg Oral Tablet Take 1 Tablet (20 mg total) by mouth Every night    Provider, Historical   benzonatate  (TESSALON ) 100 mg Oral Capsule Take 1 Capsule (100 mg total) by mouth Every 8 hours as needed for Cough 02/07/23   Larue Pock, DO   bisacodyL  (DULCOLAX) 5 mg Oral Tablet, Delayed Release (E.C.) Take 1 Tablet (5 mg total) by mouth Every 72 hours as needed for Constipation    Provider, Historical   busPIRone  (BUSPAR ) 5 mg Oral Tablet Take 1 Tablet (5 mg total) by mouth Once a day 05/01/22   Provider, Historical   calcium  carbonate-vitamin D3 (CALCIUM  600 + D) 600 mg-10 mcg (400 unit) Oral Tablet Take 1 Tablet by mouth Twice daily    Provider, Historical   dextromethorphan -guaiFENesin  (ROBAFEN DM COUGH) 10-100 mg/5  mL Oral Liquid Take 10 mL by mouth Every 4 hours as needed    Provider, Historical   docusate sodium  (COLACE) 100 mg Oral Capsule Take 1 Capsule (100 mg total) by mouth Once a day 12/20/21   Leodis Rainwater, PA-C   ergocalciferol , vitamin D2, (DRISDOL ) 1,250 mcg (50,000 unit) Oral Capsule Take 1 Capsule (50,000 Units total) by mouth On the first of the month    Provider, Historical   escitalopram  oxalate (LEXAPRO ) 5 mg Oral Tablet Take 1 Tablet (5 mg total) by mouth Once a day 05/01/22   Provider, Historical   ferrous sulfate  (FEOSOL) 325 mg (65 mg iron ) Oral Tablet Take 1 Tablet (325 mg total) by mouth Once a day 01/20/22   Camille Cedars, DO   HYDROcodone -acetaminophen  (NORCO) 5-325 mg Oral Tablet Take 1 Tablet by mouth Every 6 hours as needed for Pain 12/21/22   Cleon Dacosta, DO   JANUVIA 100 mg Oral Tablet Take 1 Tablet (100 mg total) by mouth Once a day 05/01/22   Provider, Historical   levothyroxine  (SYNTHROID ) 75 mcg Oral Tablet Take 1 Tablet (75 mcg total) by mouth Every morning    Provider, Historical   lidocaine  (LIDODERM ) 5 % Adhesive Patch, Medicated Place 1 Patch (700 mg total) on the skin Once a day    Provider, Historical   loperamide  (IMODIUM ) 2 mg Oral Capsule Take 1 Capsule (2 mg total) by mouth Once, as needed After each loose stool, not to exceed 4 caplets in 24 hours    Provider, Historical   mag hydrox/aluminum hyd/simeth (RULOX ORAL) Take 20 mL by mouth Every 4 hours as needed No more than 80ml in a 24 hour period    Provider, Historical   magnesium  hydroxide (MILK OF MAGNESIA) 400 mg/5 mL Oral Suspension Take 30 mL (2,400 mg total) by mouth Once  per day as needed    Provider, Historical   magnesium  oxide (MAG-OX) 400 mg Oral Tablet Take 1 Tablet (400 mg total) by mouth Three times a day    Provider, Historical   miconazole  nitrate (SECURA) 2 % Cream Apply topically Three times a day as needed    Provider, Historical   mineral oil (FLEET) Rectal Enema Insert 133 mL into the rectum Every 72  hours as needed    Provider, Historical   NOVOLOG U-100 INSULIN  ASPART 100 unit/mL Subcutaneous Solution Inject under the skin Per sliding scale 03/26/22   Provider, Historical   ondansetron  (ZOFRAN  ODT) 4 mg Oral Tablet, Rapid Dissolve Take 1 Tablet (4 mg total) by mouth Every 8 hours as needed for Nausea/Vomiting 12/03/21   Arletha Bend, APRN   pramipexole  (MIRAPEX ) 0.125 mg Oral Tablet Take 1 Tablet (0.125 mg total) by mouth Three times a day    Provider, Historical   Pyridoxine  (VITAMIN B6) 250 mg Oral Tablet Take 1 Tablet (250 mg total) by mouth Twice daily    Provider, Historical          ROS: A total of 10 systems were reviewed by me at the time of the visit. All are negative except the HPI and the following.           Physical Exam   ED Triage Vitals [11/04/23 1310]   BP (Non-Invasive) (!) 154/89   Heart Rate (!) 108   Respiratory Rate 16   Temperature 36.7 C (98.1 F)   SpO2 96 %   Weight 61.2 kg (135 lb)   Height 1.626 m (5\' 4" )         Physical exam  Constitutional/general: The nursing notes were reviewed and agreed upon.  The vital signs were reviewed and are listed on the chart.   83 year old female who has in no acute distress.  Patient appears to be comfortable and in no pain.  HEENT: Eyes show normal extraocular movements.  Well-hydrated oral mucosa is noted.  There is no facial trauma or abnormalities.  Neck: No midline tenderness, no meningeal signs.  Full range of motion.  No JVD.    Cardiovascular: Heart is regular rate and rhythm S1-S2 sounds were auscultated without murmur click or rub  Respiratory: Lungs are clear to auscultation in all fields without wheeze rale or rhonchi.  Decreased lung auscultation in all lung fields.  She has no wheeze no rale.  GI: Abdomen is soft non tender normal bowel sounds are auscultated.  There is no rebound tenderness or guarding  Neuro cranial nerves II through XII are intact and normal.  Patient has normal speech and normal gait.  There is no muscle  weakness in any extremities.  Sensation is intact throughout.   Psych:  The patient is alert and interacts very well with the examiner as well as her daughter.  She is not very talkative like she normally is.  She appears to be mildly depressed but she appears to be more confused and depressed.    Skin: No rash.  No ecchymosis or purpura.  The skin is warm and dry without diaphoresis.  There is no pallor.  Patient has an isolated area of petechiae to the right distal leg.  This does not appear to be an allergic reaction.  Musculoskeletal: There is no tenderness to palpation in any body region.  There is no lower extremity edema and no calf tenderness.  Negative Homan's sign bilaterally.  GU: Deferred  Procedures      Patient Data   Labs Ordered/Reviewed   B-TYPE NATRIURETIC PEPTIDE (BNP),PLASMA - Abnormal; Notable for the following components:       Result Value    BNP 255 (*)     All other components within normal limits    Narrative:                                 Class 1: 101-250 pg/mL                              Class 2: 251-550 pg/mL                              Class 3: 551-900 pg/mL                              Class 4: >901 pg/mL     The [place]  [employer] has developed a four-stage functional classification system for CHF that is based on a subjective interpretation of the severity of a patient's clinical signs and symptoms.    Class 1 - Patients have no limitations on physical activity and have no symptoms with ordinary physical activity.    Class 2 - Patients have a slight limitation of physical activity and have symptoms with ordinary physical activity.    Class 3 - Patients have a marked limitation of physical activity and have symptoms with less than ordinary physical activity, but not at rest.    Class 4 - Patients are unable to perform any physical activity without discomfort.   COMPREHENSIVE METABOLIC PANEL, NON-FASTING - Abnormal; Notable for the following components:    SODIUM 132  (*)     POTASSIUM 5.3 (*)     CHLORIDE 97 (*)     ESTIMATED GFR 50 (*)     GLUCOSE 471 (*)     ALT (SGPT) 96 (*)     AST (SGOT) 143 (*)     ALBUMIN/GLOBULIN RATIO 1.5 (*)     All other components within normal limits    Narrative:     Estimated Glomerular Filtration Rate (eGFR) is calculated using the CKD-EPI (2021) equation, intended for patients 72 years of age and older. If gender is not documented or "unknown", there will be no eGFR calculation.     CBC WITH DIFF - Abnormal; Notable for the following components:    MCV 97.1 (*)     MCHC 31.9 (*)     PLATELETS 121 (*)     MPV 7.6 (*)     NEUTROPHIL % 80 (*)     LYMPHOCYTE % 14 (*)     LYMPHOCYTE # 0.60 (*)     All other components within normal limits   URINALYSIS, MACROSCOPIC - Abnormal; Notable for the following components:    GLUCOSE >1000 (*)     All other components within normal limits   POC BLOOD GLUCOSE (RESULTS) - Abnormal; Notable for the following components:    GLUCOSE, POC 499 (*)     All other components within normal limits   POC BLOOD GLUCOSE (RESULTS) - Abnormal; Notable for the following components:    GLUCOSE, POC 471 (*)     All other components within normal limits   LACTIC ACID LEVEL  W/ REFLEX FOR LEVEL >2.0 - Normal   URINALYSIS, MICROSCOPIC - Normal   COVID-19, FLU A/B, RSV RAPID BY PCR - Normal    Narrative:     Results are for the simultaneous qualitative identification of SARS-CoV-2 (formerly 2019-nCoV), Influenza A, Influenza B, and RSV RNA. These etiologic agents are generally detectable in nasopharyngeal and nasal swabs during the ACUTE PHASE of infection. Hence, this test is intended to be performed on respiratory specimens collected from individuals with signs and symptoms of upper respiratory tract infection who meet Centers for Disease Control and Prevention (CDC) clinical and/or epidemiological criteria for Coronavirus Disease 2019 (COVID-19) testing. CDC COVID-19 criteria for testing on human specimens is available at [facility]  webpage information for Healthcare Professionals: Coronavirus Disease 2019 (COVID-19) ([employer]).     False-negative results may occur if the virus has genomic mutations, insertions, deletions, or rearrangements or if performed very early in the course of illness. Otherwise, negative results indicate virus specific RNA targets are not detected, however negative results do not preclude SARS-CoV-2 infection/COVID-19, Influenza, or Respiratory syncytial virus infection. Results should not be used as the sole basis for patient management decisions. Negative results must be combined with clinical observations, patient history, and epidemiological information. If upper respiratory tract infection is still suspected based on exposure history together with other clinical findings, re-testing should be considered.    Test methodology:   Cepheid Xpert Xpress SARS-CoV-2/Flu/RSV Assay real-time polymerase chain reaction (RT-PCR) test on the GeneXpert Dx and Xpert Xpress systems.   ADULT ROUTINE BLOOD CULTURE, SET OF 2 BOTTLES (BACTERIA AND YEAST)   ADULT ROUTINE BLOOD CULTURE, SET OF 2 BOTTLES (BACTERIA AND YEAST)   CBC/DIFF    Narrative:     The following orders were created for panel order CBC/DIFF.  Procedure                               Abnormality         Status                     ---------                               -----------         ------                     CBC WITH QIHK[[id number]]                Abnormal            Final result                 Please view results for these tests on the individual orders.   URINALYSIS, MACROSCOPIC AND MICROSCOPIC W/CULTURE REFLEX    Narrative:     The following orders were created for panel order URINALYSIS, MACROSCOPIC AND MICROSCOPIC W/CULTURE REFLEX [PRN ONLY].  Procedure                               Abnormality         Status                     ---------                               -----------         ------  URINALYSIS, MACROSCOPIC[[id number]]      Abnormal            Final result               URINALYSIS, MICROSCOPIC[[id number]]      Normal              Final result                 Please view results for these tests on the individual orders.   EXTRA TUBES    Narrative:     The following orders were created for panel order EXTRA TUBES.  Procedure                               Abnormality         Status                     ---------                               -----------         ------                     BLUE TOP VZDG[[id number]]                                    Final result               GOLD TOP RJJO[[id number]]                                    Final result                 Please view results for these tests on the individual orders.   BLUE TOP TUBE   GOLD TOP TUBE   PERFORM POC WHOLE BLOOD GLUCOSE   PERFORM POC WHOLE BLOOD GLUCOSE   PERFORM POC WHOLE BLOOD GLUCOSE       BMP:   132* (03/26 1410) 97* (03/26 1410) 24 (03/26 1410)    /     471* (03/26 1410)   5.3* (03/26 1410) 30 (03/26 1410) 1.10 (03/26 1410) \              CBC:     4.5 (03/26 1409) \   12.3 (03/26 1409) /   121* (03/26 1409)      / 38.6 (03/26 1409) \             XR CHEST PA AND LATERAL   Final Result by Edi, Radresults In (03/26 1403)   NO ACUTE FINDINGS.         Radiologist location ID: [id number]                      Medical Decision Making  Daughter refuses for her mother to have insulin  despite me explaining to her the reason for it.  I explained to her that the insulin  would bring her potassium down also.  However she is still refuses to do it.  Therefore the patient is being discharged to home without being treated for her glucose of 499.  The patient's daughter states she is very brittle diabetic  in the nursing home normally only gives just a few      Daughter now agrees for 5 U of insulin .  Units subQ.    The daughter now wants take the patient home before adequate monitoring.  Her blood sugar has only dropped about 20 points in 1  hour.  She wants to take her home because her mother is hungry.  We have informed her that if the blood sugar is not lower and she eats it is going to make it worse.  That said, the patient is being discharged back to [facility]    Code status: DNR    Problems Addressed:  Chronic obstructive pulmonary disease, unspecified COPD type (CMS 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 Decision-making details documented in ED Course.  Radiology: ordered and independent interpretation performed. Decision-making details documented in ED Course.     Details: I personally viewed all radiologic studies at the time of the visit. I agree with the radiologist.      ECG/medicine tests: ordered and independent interpretation performed. Decision-making details documented in ED Course.     Details: EKG is interpreted by me at 14:15.  This will be reviewed by cardiologist later time.    Right axis deviation poor R-wave progression across the anterior precordial leads.  Q-waves noted in septal leads V1 and V2.  Normal PR and QT/QTC interval.  There was no ST elevation no ST depression.  Sinus tachycardic at 110 beats per minute              ED Course as of 11/04/23 1930   Wed Nov 04, 2023   1711 PLATELET COUNT(!): 121   1711 SODIUM(!): 132   1711 POTASSIUM(!): 5.3   1711 CHLORIDE(!): 97   1711 BUN: 24   1711 CREATININE: 1.10   1711 GLUCOSE(!): 471   1711 ALT (SGPT)(!): 96   1711 AST (SGOT)(!): 143   1711 GLUCOSE(!): >1000   1711 COVID-19, FLU A/B, RSV RAPID BY PCR   1712 B-TYPE NATRIURETIC PEPTIDE(!): 255   1712 LACTIC ACID: 1.2         Medications  Ordered/Administered in the ED   ipratropium-albuterol  0.5 mg-3 mg(2.5 mg base)/3 mL Solution for Nebulization (3 mL Nebulization Given 11/04/23 1549)   insulin  R human 100 units/mL injection (5 Units Intravenous Given 11/04/23 1805)                 Clinical Impression   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  Emergency Medicine  [facility]          This note may have been partially generated using MModal Fluency Direct system, and there may be some incorrect  words, spellings, and punctuation that were not noted in checking the note before saving, though effort was made to avoid such errors.

## 2023-11-04 NOTE — Discharge Instructions (Signed)
 Ms. Jordan Buckley him needs to have her blood sugar checked more frequently in the next couple of days.  The steroid shot will definitely make it increased   Return to the ER if there is any emergencies   She should have blood redrawn on Monday looking at her potassium.  It was 5.2 today.    Continue all the other medications that she is normally prescribed  She needs to see the primary care provider within the next week.

## 2023-11-04 NOTE — ED Nurses Note (Signed)
 Patient to ED [address] 6 via EMS from [facility] for hypoxia. Per EMS monitor at [facility] was not reading oxygen correctly. Patient is currently on 2L NC with oxygen saturation of 97%. Patient denies any current chest pain, SOB, headache, back pain, fever, or N/V/D. Patients daughter is at bedside and states patient was just started on Augmentin  for pneumonia and "hasn't been herself latelty". Patient placed on bedside monitoring. Assessment as documented. Call bell in reach. Family at bedside.

## 2023-11-04 NOTE — ED Triage Notes (Signed)
 Sent by [facility] for evaluation - reports they saw 79% on 3l nc but ems reports hands did not have waveform reading but did read on toes at 99% on 4l nc. Pt is currently on antibiotics for pneumonia x 1 week. Pt reports that she just feels weak.     PRS: 4l nc, monitor, BS 348

## 2023-11-09 LAB — ADULT ROUTINE BLOOD CULTURE, SET OF 2 BOTTLES (BACTERIA AND YEAST)
BLOOD CULTURE, ROUTINE: NO GROWTH
BLOOD CULTURE, ROUTINE: NO GROWTH

## 2023-11-16 ENCOUNTER — Encounter (HOSPITAL_COMMUNITY): Payer: Self-pay

## 2023-11-16 ENCOUNTER — Other Ambulatory Visit: Payer: Self-pay

## 2023-11-16 ENCOUNTER — Emergency Department (HOSPITAL_COMMUNITY)

## 2023-11-16 ENCOUNTER — Inpatient Hospital Stay (HOSPITAL_COMMUNITY)

## 2023-11-16 ENCOUNTER — Inpatient Hospital Stay
Admission: EM | Admit: 2023-11-16 | Discharge: 2023-11-23 | DRG: 291 | Disposition: A | Discharge status: Skilled Nursing Facility | Source: Skilled Nursing Facility | Attending: HOSPITALIST | Admitting: HOSPITALIST

## 2023-11-16 DIAGNOSIS — T361X5A Adverse effect of cephalosporins and other beta-lactam antibiotics, initial encounter: Secondary | ICD-10-CM | POA: Diagnosis present

## 2023-11-16 DIAGNOSIS — J440 Chronic obstructive pulmonary disease with acute lower respiratory infection: Secondary | ICD-10-CM | POA: Diagnosis present

## 2023-11-16 DIAGNOSIS — I071 Rheumatic tricuspid insufficiency: Secondary | ICD-10-CM | POA: Diagnosis present

## 2023-11-16 DIAGNOSIS — R0902 Hypoxemia: Secondary | ICD-10-CM

## 2023-11-16 DIAGNOSIS — E8770 Fluid overload, unspecified: Secondary | ICD-10-CM | POA: Diagnosis present

## 2023-11-16 DIAGNOSIS — F32A Depression, unspecified: Secondary | ICD-10-CM | POA: Diagnosis present

## 2023-11-16 DIAGNOSIS — I272 Pulmonary hypertension, unspecified: Secondary | ICD-10-CM | POA: Diagnosis present

## 2023-11-16 DIAGNOSIS — R0600 Dyspnea, unspecified: Principal | ICD-10-CM

## 2023-11-16 DIAGNOSIS — H9190 Unspecified hearing loss, unspecified ear: Secondary | ICD-10-CM | POA: Diagnosis present

## 2023-11-16 DIAGNOSIS — Z1629 Resistance to other single specified antibiotic: Secondary | ICD-10-CM | POA: Diagnosis present

## 2023-11-16 DIAGNOSIS — F02818 Dementia in other diseases classified elsewhere, unspecified severity, with other behavioral disturbance: Secondary | ICD-10-CM | POA: Diagnosis present

## 2023-11-16 DIAGNOSIS — I2510 Atherosclerotic heart disease of native coronary artery without angina pectoris: Secondary | ICD-10-CM | POA: Diagnosis present

## 2023-11-16 DIAGNOSIS — J9602 Acute respiratory failure with hypercapnia: Secondary | ICD-10-CM | POA: Diagnosis present

## 2023-11-16 DIAGNOSIS — I110 Hypertensive heart disease with heart failure: Principal | ICD-10-CM | POA: Diagnosis present

## 2023-11-16 DIAGNOSIS — Z79899 Other long term (current) drug therapy: Secondary | ICD-10-CM

## 2023-11-16 DIAGNOSIS — Z7901 Long term (current) use of anticoagulants: Secondary | ICD-10-CM

## 2023-11-16 DIAGNOSIS — Z8701 Personal history of pneumonia (recurrent): Secondary | ICD-10-CM

## 2023-11-16 DIAGNOSIS — N390 Urinary tract infection, site not specified: Secondary | ICD-10-CM | POA: Diagnosis present

## 2023-11-16 DIAGNOSIS — Z8673 Personal history of transient ischemic attack (TIA), and cerebral infarction without residual deficits: Secondary | ICD-10-CM

## 2023-11-16 DIAGNOSIS — Z951 Presence of aortocoronary bypass graft: Secondary | ICD-10-CM

## 2023-11-16 DIAGNOSIS — E11649 Type 2 diabetes mellitus with hypoglycemia without coma: Secondary | ICD-10-CM | POA: Diagnosis not present

## 2023-11-16 DIAGNOSIS — J4489 Other specified chronic obstructive pulmonary disease: Secondary | ICD-10-CM | POA: Diagnosis present

## 2023-11-16 DIAGNOSIS — I509 Heart failure, unspecified: Secondary | ICD-10-CM

## 2023-11-16 DIAGNOSIS — I5020 Unspecified systolic (congestive) heart failure: Secondary | ICD-10-CM

## 2023-11-16 DIAGNOSIS — G309 Alzheimer's disease, unspecified: Secondary | ICD-10-CM | POA: Diagnosis present

## 2023-11-16 DIAGNOSIS — Z1152 Encounter for screening for COVID-19: Secondary | ICD-10-CM

## 2023-11-16 DIAGNOSIS — I4891 Unspecified atrial fibrillation: Secondary | ICD-10-CM | POA: Diagnosis not present

## 2023-11-16 DIAGNOSIS — Z7984 Long term (current) use of oral hypoglycemic drugs: Secondary | ICD-10-CM

## 2023-11-16 DIAGNOSIS — R8271 Bacteriuria: Secondary | ICD-10-CM

## 2023-11-16 DIAGNOSIS — T360X5A Adverse effect of penicillins, initial encounter: Secondary | ICD-10-CM | POA: Diagnosis present

## 2023-11-16 DIAGNOSIS — G9341 Metabolic encephalopathy: Secondary | ICD-10-CM | POA: Diagnosis present

## 2023-11-16 DIAGNOSIS — M810 Age-related osteoporosis without current pathological fracture: Secondary | ICD-10-CM | POA: Diagnosis present

## 2023-11-16 DIAGNOSIS — Z66 Do not resuscitate: Secondary | ICD-10-CM | POA: Diagnosis present

## 2023-11-16 DIAGNOSIS — E875 Hyperkalemia: Secondary | ICD-10-CM | POA: Diagnosis not present

## 2023-11-16 DIAGNOSIS — T380X5A Adverse effect of glucocorticoids and synthetic analogues, initial encounter: Secondary | ICD-10-CM | POA: Diagnosis present

## 2023-11-16 DIAGNOSIS — F0283 Dementia in other diseases classified elsewhere, unspecified severity, with mood disturbance: Secondary | ICD-10-CM | POA: Diagnosis present

## 2023-11-16 DIAGNOSIS — J9601 Acute respiratory failure with hypoxia: Secondary | ICD-10-CM | POA: Diagnosis present

## 2023-11-16 DIAGNOSIS — Z79890 Hormone replacement therapy (postmenopausal): Secondary | ICD-10-CM

## 2023-11-16 DIAGNOSIS — E1165 Type 2 diabetes mellitus with hyperglycemia: Secondary | ICD-10-CM | POA: Diagnosis present

## 2023-11-16 DIAGNOSIS — E119 Type 2 diabetes mellitus without complications: Secondary | ICD-10-CM | POA: Diagnosis present

## 2023-11-16 DIAGNOSIS — I5031 Acute diastolic (congestive) heart failure: Secondary | ICD-10-CM | POA: Diagnosis present

## 2023-11-16 DIAGNOSIS — Z7951 Long term (current) use of inhaled steroids: Secondary | ICD-10-CM

## 2023-11-16 DIAGNOSIS — J449 Chronic obstructive pulmonary disease, unspecified: Secondary | ICD-10-CM | POA: Diagnosis present

## 2023-11-16 DIAGNOSIS — Z5320 Procedure and treatment not carried out because of patient's decision for unspecified reasons: Secondary | ICD-10-CM | POA: Diagnosis present

## 2023-11-16 DIAGNOSIS — Z9981 Dependence on supplemental oxygen: Secondary | ICD-10-CM

## 2023-11-16 DIAGNOSIS — E039 Hypothyroidism, unspecified: Secondary | ICD-10-CM | POA: Diagnosis present

## 2023-11-16 DIAGNOSIS — I639 Cerebral infarction, unspecified: Secondary | ICD-10-CM | POA: Diagnosis present

## 2023-11-16 DIAGNOSIS — Z794 Long term (current) use of insulin: Secondary | ICD-10-CM

## 2023-11-16 LAB — COMPREHENSIVE METABOLIC PANEL, NON-FASTING
ALBUMIN/GLOBULIN RATIO: 1.4 (ref 0.8–1.4)
ALBUMIN: 3.8 g/dL (ref 3.5–5.7)
ALKALINE PHOSPHATASE: 84 U/L (ref 34–104)
ALT (SGPT): 73 U/L — ABNORMAL HIGH (ref 7–52)
ANION GAP: 10 mmol/L (ref 4–13)
AST (SGOT): 68 U/L — ABNORMAL HIGH (ref 13–39)
BILIRUBIN TOTAL: 0.6 mg/dL (ref 0.3–1.0)
BUN/CREA RATIO: 25 — ABNORMAL HIGH (ref 6–22)
BUN: 31 mg/dL — ABNORMAL HIGH (ref 7–25)
CALCIUM, CORRECTED: 8.9 mg/dL (ref 8.9–10.8)
CALCIUM: 8.7 mg/dL (ref 8.6–10.3)
CHLORIDE: 98 mmol/L (ref 98–107)
CO2 TOTAL: 27 mmol/L (ref 21–31)
CREATININE: 1.22 mg/dL (ref 0.60–1.30)
ESTIMATED GFR: 44 mL/min/{1.73_m2} — ABNORMAL LOW (ref >59)
GLOBULIN: 2.8 (ref 2.0–3.5)
GLUCOSE: 269 mg/dL — ABNORMAL HIGH (ref 74–109)
OSMOLALITY, CALCULATED: 286 mosm/kg (ref 270–290)
POTASSIUM: 5.1 mmol/L (ref 3.5–5.1)
PROTEIN TOTAL: 6.6 g/dL (ref 6.4–8.9)
SODIUM: 135 mmol/L — ABNORMAL LOW (ref 136–145)

## 2023-11-16 LAB — URINALYSIS, MACROSCOPIC
BILIRUBIN: NEGATIVE mg/dL
BLOOD: NEGATIVE mg/dL
GLUCOSE: 1000 mg/dL — AB
KETONES: NEGATIVE mg/dL
LEUKOCYTES: 75 WBCs/uL — AB
NITRITE: NEGATIVE
PH: 5.5 (ref 5.0–9.0)
PROTEIN: 20 mg/dL
SPECIFIC GRAVITY: 1.018 (ref 1.002–1.030)
UROBILINOGEN: NORMAL mg/dL

## 2023-11-16 LAB — B-TYPE NATRIURETIC PEPTIDE (BNP),PLASMA: BNP: 210 pg/mL — ABNORMAL HIGH (ref 1–100)

## 2023-11-16 LAB — CBC WITH DIFF
BASOPHIL #: 0 10*3/uL (ref 0.00–0.10)
BASOPHIL %: 0 % (ref 0–1)
EOSINOPHIL #: 0 10*3/uL (ref 0.00–0.50)
EOSINOPHIL %: 0 % — ABNORMAL LOW (ref 1–7)
HCT: 34.5 % (ref 31.2–41.9)
HGB: 11.2 g/dL (ref 10.9–14.3)
LYMPHOCYTE #: 0.2 10*3/uL — ABNORMAL LOW (ref 1.10–3.10)
LYMPHOCYTE %: 4 % — ABNORMAL LOW (ref 16–46)
MCH: 31.1 pg (ref 24.7–32.8)
MCHC: 32.4 g/dL (ref 32.3–35.6)
MCV: 96 fL — ABNORMAL HIGH (ref 75.5–95.3)
MONOCYTE #: 0.1 10*3/uL — ABNORMAL LOW (ref 0.20–0.90)
MONOCYTE %: 3 % — ABNORMAL LOW (ref 4–11)
MPV: 7.2 fL — ABNORMAL LOW (ref 7.9–10.8)
NEUTROPHIL #: 4.2 10*3/uL (ref 1.90–8.20)
NEUTROPHIL %: 93 % — ABNORMAL HIGH (ref 43–77)
PLATELETS: 125 10*3/uL — ABNORMAL LOW (ref 140–440)
RBC: 3.59 10*6/uL — ABNORMAL LOW (ref 3.63–4.92)
RDW: 15.5 % (ref 12.3–17.7)
WBC: 4.6 10*3/uL (ref 3.8–11.8)

## 2023-11-16 LAB — COVID-19, FLU A/B, RSV RAPID BY PCR
INFLUENZA VIRUS TYPE A: NOT DETECTED
INFLUENZA VIRUS TYPE B: NOT DETECTED
RESPIRATORY SYNCTIAL VIRUS (RSV): NOT DETECTED
SARS-CoV-2: NOT DETECTED

## 2023-11-16 LAB — SCAN DIFFERENTIAL
PLATELET MORPHOLOGY COMMENT: ADEQUATE
RBC MORPHOLOGY COMMENT: NORMAL

## 2023-11-16 LAB — TROPONIN-I: TROPONIN I: 18 ng/L — ABNORMAL HIGH (ref <15)

## 2023-11-16 LAB — URINALYSIS, MICROSCOPIC
HYALINE CASTS: 8 /LPF — ABNORMAL HIGH (ref <0)
RBCS: 1 /HPF (ref <4)
SQUAMOUS EPITHELIAL: 2 /HPF (ref <28)
WBCS: 2 /HPF (ref <6)

## 2023-11-16 LAB — LACTIC ACID LEVEL W/ REFLEX FOR LEVEL >2.0: LACTIC ACID: 0.8 mmol/L (ref 0.5–2.2)

## 2023-11-16 LAB — C-REACTIVE PROTEIN(CRP),INFLAMMATION: C-REACTIVE PROTEIN (CRP): <0.5 mg/dL (ref 0.1–0.5)

## 2023-11-16 MED ORDER — IPRATROPIUM 0.5 MG-ALBUTEROL 3 MG (2.5 MG BASE)/3 ML NEBULIZATION SOLN
3.0000 mL | INHALATION_SOLUTION | RESPIRATORY_TRACT | Status: AC
Start: 2023-11-16 — End: 2023-11-16
  Administered 2023-11-16: 3 mL via RESPIRATORY_TRACT

## 2023-11-16 NOTE — ED Provider Notes (Signed)
 [facility] - Emergency Department  ED Primary Provider Note  History of Present Illness   Chief Complaint   Patient presents with    Congestion    Decreased Oxygen Level Symptomatic     Arrival: The patient arrived by Ambulance  Jordan Buckley is a 83 y.o. female who had concerns including Congestion and Decreased Oxygen Level Symptomatic.  The patient presents to the ED via EMS from a local nursing home with due to cough, congestion, shortness breath.  Family states the patient has had recurrent pneumonia over the past 3 weeks.  The patient has taken 2 different antibiotics without improvement.  Today the ED nursing home staff noticed the patient was struggling to breathe.  Oxygen saturation was in the 70s.  They had did increase her oxygen she is in from 2 to 4 L nasal cannula.    History Reviewed This Encounter: Medical History  Surgical History  Family History  Social History    Physical Exam   ED Triage Vitals [11/16/23 1945]   BP (Non-Invasive) (!) 164/64   Heart Rate (!) 107   Respiratory Rate 20   Temperature 36.7 C (98.1 F)   SpO2 94 %   Weight 61.2 kg (135 lb)   Height 1.626 m (5\' 4" )       Constitutional:  83 y.o. female who appears in no distress. Normal color, no cyanosis.   HENT:   Head: Normocephalic and atraumatic.   Mouth/Throat: Oropharynx is clear and moist.   Eyes: EOMI, PERRL   Neck: Trachea midline. Neck supple.  Cardiovascular: RRR, No murmurs, rubs or gallops. Intact distal pulses.  Pulmonary/Chest: BS equal bilaterally. No respiratory distress. Crackles bilateral bases.   Abdominal: Bowel sounds present and normal. Abdomen soft, no tenderness, no rebound and no guarding.  Back: No midline spinal tenderness, no paraspinal tenderness, no CVA tenderness.           Musculoskeletal: No edema, tenderness or deformity.  Skin: warm and dry. No rash, erythema, pallor or cyanosis  Psychiatric: normal mood and affect. Behavior is normal.   Neurological: Patient keenly alert and  responsive, easily able to raise eyebrows, facial muscles/expressions symmetric, speaking in fluent sentences, moving all extremities equally and fully, normal gait  Patient Data     Labs Ordered/Reviewed   COMPREHENSIVE METABOLIC PANEL, NON-FASTING - Abnormal; Notable for the following components:       Result Value    SODIUM 135 (*)     BUN 31 (*)     BUN/CREA RATIO 25 (*)     ESTIMATED GFR 44 (*)     GLUCOSE 269 (*)     ALT (SGPT) 73 (*)     AST (SGOT) 68 (*)     All other components within normal limits    Narrative:     Estimated Glomerular Filtration Rate (eGFR) is calculated using the CKD-EPI (2021) equation, intended for patients 75 years of age and older. If gender is not documented or "unknown", there will be no eGFR calculation.     CBC WITH DIFF - Abnormal; Notable for the following components:    RBC 3.59 (*)     MCV 96.0 (*)     PLATELETS 125 (*)     MPV 7.2 (*)     NEUTROPHIL % 93 (*)     LYMPHOCYTE % 4 (*)     MONOCYTE % 3 (*)     EOSINOPHIL % 0 (*)     LYMPHOCYTE # 0.20 (*)  MONOCYTE # 0.10 (*)     All other components within normal limits   B-TYPE NATRIURETIC PEPTIDE (BNP),PLASMA - Abnormal; Notable for the following components:    BNP 210 (*)     All other components within normal limits    Narrative:                                 Class 1: 101-250 pg/mL                              Class 2: 251-550 pg/mL                              Class 3: 551-900 pg/mL                              Class 4: >901 pg/mL     The [place] [employer] has developed a four-stage functional classification system for CHF that is based on a subjective interpretation of the severity of a patient's clinical signs and symptoms.    Class 1 - Patients have no limitations on physical activity and have no symptoms with ordinary physical activity.    Class 2 - Patients have a slight limitation of physical activity and have symptoms with ordinary physical activity.    Class 3 - Patients have a marked limitation of  physical activity and have symptoms with less than ordinary physical activity, but not at rest.    Class 4 - Patients are unable to perform any physical activity without discomfort.   TROPONIN-I - Abnormal; Notable for the following components:    TROPONIN I 18 (*)     All other components within normal limits   C-REACTIVE PROTEIN(CRP),INFLAMMATION - Normal   LACTIC ACID LEVEL W/ REFLEX FOR LEVEL >2.0 - Normal   COVID-19, FLU A/B, RSV RAPID BY PCR - Normal    Narrative:     Results are for the simultaneous qualitative identification of SARS-CoV-2 (formerly 2019-nCoV), Influenza A, Influenza B, and RSV RNA. These etiologic agents are generally detectable in nasopharyngeal and nasal swabs during the ACUTE PHASE of infection. Hence, this test is intended to be performed on respiratory specimens collected from individuals with signs and symptoms of upper respiratory tract infection who meet Centers for Disease Control and Prevention (CDC) clinical and/or epidemiological criteria for Coronavirus Disease 2019 (COVID-19) testing. CDC COVID-19 criteria for testing on human specimens is available at [facility] webpage information for Healthcare Professionals: Coronavirus Disease 2019 (COVID-19) ([email or web address]).     False-negative results may occur if the virus has genomic mutations, insertions, deletions, or rearrangements or if performed very early in the course of illness. Otherwise, negative results indicate virus specific RNA targets are not detected, however negative results do not preclude SARS-CoV-2 infection/COVID-19, Influenza, or Respiratory syncytial virus infection. Results should not be used as the sole basis for patient management decisions. Negative results must be combined with clinical observations, patient history, and epidemiological information. If upper respiratory tract infection is still suspected based on exposure history together with other clinical findings,  re-testing should be considered.    Test methodology:   Cepheid Xpert Xpress SARS-CoV-2/Flu/RSV Assay real-time polymerase chain reaction (RT-PCR) test on the GeneXpert Dx and Xpert Xpress systems.   ADULT ROUTINE BLOOD CULTURE, SET  OF 2 BOTTLES (BACTERIA AND YEAST)   ADULT ROUTINE BLOOD CULTURE, SET OF 2 BOTTLES (BACTERIA AND YEAST)   CBC/DIFF    Narrative:     The following orders were created for panel order CBC/DIFF.  Procedure                               Abnormality         Status                     ---------                               -----------         ------                     CBC WITH DGLO[[id number]]                Abnormal            Final result                 Please view results for these tests on the individual orders.   SCAN DIFFERENTIAL   URINALYSIS, MACROSCOPIC AND MICROSCOPIC W/CULTURE REFLEX    Narrative:     The following orders were created for panel order URINALYSIS, MACROSCOPIC AND MICROSCOPIC W/CULTURE REFLEX.  Procedure                               Abnormality         Status                     ---------                               -----------         ------                     URINALYSIS, MACROSCOPIC[[id number]]                                                     URINALYSIS, MICROSCOPIC[[id number]]                                                       Please view results for these tests on the individual orders.   URINALYSIS, MACROSCOPIC   URINALYSIS, MICROSCOPIC     XR AP MOBILE CHEST   Final Result by Edi, Radresults In (04/07 2057)   NO ACUTE FINDINGS.                  Radiologist location ID: [id number]           Medical Decision Making        Medical Decision Making  This patient presents to the ED via EMS due to shortness of breath and new oxygen requirement of 4L NC.  Pt has been treated for pneumonia  recently.  Labs were collected.  WBC 4.6, Hgb 11.2, creatinine 1.22, ALT 73, AST 68, BNP 210, Trop 18.  CXR read by radiology as no acute findings.  The case was discussed  with the hospitalist and the patient will be admitted.                Medications Ordered/Administered in the ED   ipratropium-albuterol 0.5 mg-3 mg(2.5 mg base)/3 mL Solution for Nebulization (3 mL Nebulization Given 11/16/23 2238)     Clinical Impression   Dyspnea, unspecified type (Primary)   Hypoxemia       Disposition: Admitted

## 2023-11-16 NOTE — ED Triage Notes (Signed)
 EMS called to [facility] for low 02 SAT and congestion. Pt does use 2L NC cont. Increased to 4L NC prior to EMS arrival to [facility].

## 2023-11-16 NOTE — ED Nurses Note (Signed)
 Patient resting quietly in ER stretcher, no acute distress noted. Family at bedside. Denies any needs at this time. Call bell within reach.

## 2023-11-16 NOTE — Respiratory Therapy (Signed)
 11/16/23 2238   Respiratory   Respiratory WDL WDL   Additional Documentation Aerosol Therapy Group   Cough Frequency no cough;infrequent   Cough Type loose;congested   Rhythm/Pattern, Respiratory unlabored;no shortness of breath reported   Breath Sounds   L General Breath Sounds Expiratory Wheezes;Diminished   R General Breath Sounds Expiratory Wheezes;Diminished   Aerosol Therapy (SVN)   Start Time 2238   Treatment Status Given   Route (Aerosol Therapy) mask;with oxygen   $ Respiratory Treatment (Resp only) Neb (Initial)      Medications DuoNeb (albuterol-atrovent)   Signs of Intolerance (SVN) none   Respiratory Pre/Post-Treatment Assess   Pre-Treatment Heart Rate (beats/min) 107   Pre-Treatment Resp Rate (breaths/min) 21   Post-treatment Heart Rate (beats/min) 107   Post-treatment Resp Rate (breaths/min) 21   Device (Oxygen Therapy) nasal cannula   Flow (L/min) (Oxygen Therapy) 44     Pt has audible exp wheeze

## 2023-11-16 NOTE — ED Nurses Note (Addendum)
 Patient presents to ED 22 c/o of SOB and low O2 sat. Patient's daughter states patient is bleeding rectally, daughter states they were told she had hemorrhoids. Patient's daughter also states patient appears to be very bloated to her. Patient did recently have pneumonia. Daughter also states patient has been very tired and has been sleeping a lot.

## 2023-11-17 ENCOUNTER — Encounter (HOSPITAL_COMMUNITY): Payer: Self-pay

## 2023-11-17 ENCOUNTER — Inpatient Hospital Stay (HOSPITAL_COMMUNITY)

## 2023-11-17 DIAGNOSIS — F32A Depression, unspecified: Secondary | ICD-10-CM | POA: Diagnosis present

## 2023-11-17 DIAGNOSIS — I639 Cerebral infarction, unspecified: Secondary | ICD-10-CM | POA: Diagnosis present

## 2023-11-17 DIAGNOSIS — J449 Chronic obstructive pulmonary disease, unspecified: Secondary | ICD-10-CM | POA: Diagnosis present

## 2023-11-17 DIAGNOSIS — R188 Other ascites: Secondary | ICD-10-CM

## 2023-11-17 DIAGNOSIS — E8770 Fluid overload, unspecified: Secondary | ICD-10-CM | POA: Diagnosis present

## 2023-11-17 DIAGNOSIS — R140 Abdominal distension (gaseous): Secondary | ICD-10-CM

## 2023-11-17 DIAGNOSIS — I517 Cardiomegaly: Secondary | ICD-10-CM

## 2023-11-17 LAB — MANUAL DIFFERENTIAL
EOSINOPHIL %: 1 % (ref 0–7)
EOSINOPHIL ABSOLUTE: 0.04 10*3/uL (ref 0.00–0.80)
EOSINOPHILS MANUAL: 1
LYMPHOCYTE %: 12 % — ABNORMAL LOW (ref 25–45)
LYMPHOCYTE ABSOLUTE: 0.46 10*3/uL — ABNORMAL LOW (ref 1.10–5.00)
LYMPHOCYTES MANUAL: 12
MONOCYTE %: 6 % (ref 0–12)
MONOCYTE ABSOLUTE: 0.23 10*3/uL (ref 0.00–1.30)
MONOCYTES MANUAL: 6
NEUTROPHIL %: 81 % — ABNORMAL HIGH (ref 40–76)
NEUTROPHIL ABSOLUTE: 3.08 10*3/uL (ref 1.80–8.40)
NEUTROPHILS MANUAL: 81
PLATELET MORPHOLOGY COMMENT: ADEQUATE
TOTAL CELLS COUNTED [#] IN BLOOD: 100
WBC: 3.8 10*3/uL

## 2023-11-17 LAB — BLOOD GAS W/ CO-OX, LYTES, LACTATE REFLEX
%FIO2 (ARTERIAL): 32 %
BASE EXCESS (ARTERIAL): 6.6 mmol/L — ABNORMAL HIGH (ref 0.0–3.0)
BICARBONATE (ARTERIAL): 30.1 mmol/L — ABNORMAL HIGH (ref 21.0–28.0)
CARBOXYHEMOGLOBIN: 1.2 % (ref <=3.0)
CHLORIDE: 98 mmol/L (ref 98–107)
GLUCOSE: 253 mg/dL — ABNORMAL HIGH (ref 65–125)
HEMATOCRITRT: 33 % — ABNORMAL LOW (ref 37–50)
HEMOGLOBIN: 10.9 g/dL — ABNORMAL LOW (ref 12.0–18.0)
IONIZED CALCIUM: 1.19 mmol/L (ref 1.15–1.33)
LACTATE: 0.6 mmol/L (ref <=1.9)
MET-HEMOGLOBIN: <0.7 % (ref <=1.5)
O2 SATURATION (ARTERIAL): 97 % (ref 94.0–98.0)
O2CT: 14.9 %
OXYHEMOGLOBIN: 96.2 % — ABNORMAL HIGH (ref 90.0–95.0)
PAO2/FIO2 RATIO: 303
PCO2 (ARTERIAL): 67 mmHg — ABNORMAL HIGH (ref 35–45)
PH (ARTERIAL): 7.32 — ABNORMAL LOW (ref 7.35–7.45)
PO2 (ARTERIAL): 97 mmHg (ref 83–108)
SODIUM: 132 mmol/L — ABNORMAL LOW (ref 136–145)
WHOLE BLOOD POTASSIUM: 4.9 mmol/L (ref 3.5–5.1)

## 2023-11-17 LAB — COMPREHENSIVE METABOLIC PANEL, NON-FASTING
ALBUMIN/GLOBULIN RATIO: 1.4 (ref 0.8–1.4)
ALBUMIN: 3.6 g/dL (ref 3.5–5.7)
ALKALINE PHOSPHATASE: 79 U/L (ref 34–104)
ALT (SGPT): 70 U/L — ABNORMAL HIGH (ref 7–52)
ANION GAP: 12 mmol/L (ref 4–13)
AST (SGOT): 68 U/L — ABNORMAL HIGH (ref 13–39)
BILIRUBIN TOTAL: 0.6 mg/dL (ref 0.3–1.0)
BUN/CREA RATIO: 26 — ABNORMAL HIGH (ref 6–22)
BUN: 31 mg/dL — ABNORMAL HIGH (ref 7–25)
CALCIUM, CORRECTED: 9.3 mg/dL (ref 8.9–10.8)
CALCIUM: 9 mg/dL (ref 8.6–10.3)
CHLORIDE: 101 mmol/L (ref 98–107)
CO2 TOTAL: 23 mmol/L (ref 21–31)
CREATININE: 1.18 mg/dL (ref 0.60–1.30)
ESTIMATED GFR: 46 mL/min/{1.73_m2} — ABNORMAL LOW (ref >59)
GLOBULIN: 2.6 (ref 2.0–3.5)
GLUCOSE: 309 mg/dL — ABNORMAL HIGH (ref 74–109)
OSMOLALITY, CALCULATED: 290 mosm/kg (ref 270–290)
POTASSIUM: 5.3 mmol/L — ABNORMAL HIGH (ref 3.5–5.1)
PROTEIN TOTAL: 6.2 g/dL — ABNORMAL LOW (ref 6.4–8.9)
SODIUM: 136 mmol/L (ref 136–145)

## 2023-11-17 LAB — CBC WITH DIFF
HCT: 34.7 % (ref 31.2–41.9)
HGB: 11.3 g/dL (ref 10.9–14.3)
MCH: 31.2 pg (ref 24.7–32.8)
MCHC: 32.4 g/dL (ref 32.3–35.6)
MCV: 96.2 fL — ABNORMAL HIGH (ref 75.5–95.3)
MPV: 7.2 fL — ABNORMAL LOW (ref 7.9–10.8)
PLATELETS: 123 10*3/uL — ABNORMAL LOW (ref 140–440)
RBC: 3.61 10*6/uL — ABNORMAL LOW (ref 3.63–4.92)
RDW: 15.5 % (ref 12.3–17.7)
WBC: 3.8 10*3/uL (ref 3.8–11.8)

## 2023-11-17 LAB — BASIC METABOLIC PANEL
ANION GAP: 18 mmol/L — ABNORMAL HIGH (ref 4–13)
BUN/CREA RATIO: 27 — ABNORMAL HIGH (ref 6–22)
BUN: 32 mg/dL — ABNORMAL HIGH (ref 7–25)
CALCIUM: 8.7 mg/dL (ref 8.6–10.3)
CHLORIDE: 100 mmol/L (ref 98–107)
CO2 TOTAL: 21 mmol/L (ref 21–31)
CREATININE: 1.19 mg/dL (ref 0.60–1.30)
ESTIMATED GFR: 45 mL/min/{1.73_m2} — ABNORMAL LOW (ref >59)
GLUCOSE: 50 mg/dL — ABNORMAL LOW (ref 74–109)
OSMOLALITY, CALCULATED: 282 mosm/kg (ref 270–290)
POTASSIUM: 5 mmol/L (ref 3.5–5.1)
SODIUM: 139 mmol/L (ref 136–145)

## 2023-11-17 LAB — POC BLOOD GLUCOSE (RESULTS)
GLUCOSE, POC: 309 mg/dL — ABNORMAL HIGH (ref 70–100)
GLUCOSE, POC: 213 mg/dL — ABNORMAL HIGH (ref 70–100)
GLUCOSE, POC: 30 mg/dL — CL (ref 70–100)
GLUCOSE, POC: 88 mg/dL (ref 70–100)
GLUCOSE, POC: 112 mg/dL — ABNORMAL HIGH (ref 70–100)
GLUCOSE, POC: 53 mg/dL — ABNORMAL LOW (ref 70–100)
GLUCOSE, POC: 131 mg/dL — ABNORMAL HIGH (ref 70–100)
GLUCOSE, POC: 143 mg/dL — ABNORMAL HIGH (ref 70–100)
GLUCOSE, POC: 190 mg/dL — ABNORMAL HIGH (ref 70–100)
GLUCOSE, POC: 209 mg/dL — ABNORMAL HIGH (ref 70–100)

## 2023-11-17 LAB — TROPONIN-I
TROPONIN I: 18 ng/L — ABNORMAL HIGH (ref <15)
TROPONIN I: 18 ng/L — ABNORMAL HIGH (ref <15)

## 2023-11-17 LAB — TRANSTHORACIC ECHOCARDIOGRAM - ADULT: EF: 65

## 2023-11-17 LAB — FOLATE: FOLATE: 16.2 ng/mL (ref 5.9–24.8)

## 2023-11-17 LAB — HGA1C (HEMOGLOBIN A1C WITH EST AVG GLUCOSE): HEMOGLOBIN A1C: 11.2 % — ABNORMAL HIGH (ref 4.0–6.0)

## 2023-11-17 LAB — MAGNESIUM: MAGNESIUM: 1.9 mg/dL (ref 1.9–2.7)

## 2023-11-17 LAB — GOLD TOP TUBE

## 2023-11-17 LAB — VITAMIN B12: VITAMIN B 12: 549 pg/mL (ref 180–914)

## 2023-11-17 MED ORDER — DOCUSATE SODIUM 100 MG CAPSULE
ORAL_CAPSULE | ORAL | Status: AC
Start: 2023-11-17 — End: 2023-11-17
  Filled 2023-11-17: qty 1

## 2023-11-17 MED ORDER — RAMELTEON 8 MG TABLET
8.0000 mg | ORAL_TABLET | Freq: Every evening | ORAL | Status: DC
Start: 2023-11-17 — End: 2023-11-23
  Administered 2023-11-17 – 2023-11-22 (×6): 8 mg via ORAL
  Filled 2023-11-17 (×6): qty 1

## 2023-11-17 MED ORDER — ZINC OXIDE 20 % TOPICAL OINTMENT
TOPICAL_OINTMENT | CUTANEOUS | Status: DC | PRN
Start: 2023-11-17 — End: 2023-11-23
  Filled 2023-11-17: qty 57

## 2023-11-17 MED ORDER — DEXTROSE 50 % IN WATER (D50W) INTRAVENOUS SYRINGE
INJECTION | INTRAVENOUS | Status: AC
Start: 2023-11-17 — End: 2023-11-17
  Filled 2023-11-17: qty 50

## 2023-11-17 MED ORDER — BISACODYL 5 MG TABLET,DELAYED RELEASE
5.0000 mg | DELAYED_RELEASE_TABLET | ORAL | Status: DC | PRN
Start: 2023-11-17 — End: 2023-11-23

## 2023-11-17 MED ORDER — LEVOTHYROXINE 50 MCG TABLET
ORAL_TABLET | ORAL | Status: AC
Start: 2023-11-17 — End: 2023-11-17
  Filled 2023-11-17: qty 2

## 2023-11-17 MED ORDER — LEVOTHYROXINE 50 MCG TABLET
75.0000 ug | ORAL_TABLET | Freq: Every morning | ORAL | Status: DC
Start: 2023-11-17 — End: 2023-11-23
  Administered 2023-11-17 – 2023-11-23 (×7): 75 ug via ORAL
  Filled 2023-11-17 (×6): qty 2

## 2023-11-17 MED ORDER — FUROSEMIDE 10 MG/ML INJECTION SOLUTION
40.0000 mg | Freq: Once | INTRAMUSCULAR | Status: AC
Start: 2023-11-17 — End: 2023-11-17
  Administered 2023-11-17: 40 mg via INTRAVENOUS
  Filled 2023-11-17: qty 4

## 2023-11-17 MED ORDER — PRAMIPEXOLE 0.25 MG TABLET
0.1250 mg | ORAL_TABLET | Freq: Three times a day (TID) | ORAL | Status: DC
Start: 2023-11-17 — End: 2023-11-23
  Administered 2023-11-17 – 2023-11-23 (×20): 0.125 mg via ORAL
  Filled 2023-11-17 (×5): qty 1
  Filled 2023-11-17: qty 0.5
  Filled 2023-11-17 (×10): qty 1
  Filled 2023-11-17 (×2): qty 0.5
  Filled 2023-11-17 (×3): qty 1
  Filled 2023-11-17: qty 0.5
  Filled 2023-11-17: qty 1
  Filled 2023-11-17: qty 0.5
  Filled 2023-11-17: qty 1

## 2023-11-17 MED ORDER — DEXTROMETHORPHAN-GUAIFENESIN 10 MG-100 MG/5 ML ORAL SYRUP
10.0000 mL | ORAL_SOLUTION | ORAL | Status: DC | PRN
Start: 2023-11-17 — End: 2023-11-23
  Administered 2023-11-17 (×2): 10 mL via ORAL
  Filled 2023-11-17 (×2): qty 10

## 2023-11-17 MED ORDER — GLUCAGON HCL 1 MG/ML SOLUTION FOR INJECTION
1.0000 mg | Freq: Once | INTRAMUSCULAR | Status: DC | PRN
Start: 2023-11-17 — End: 2023-11-23

## 2023-11-17 MED ORDER — NYSTATIN 100,000 UNIT/GRAM TOPICAL POWDER
Freq: Two times a day (BID) | CUTANEOUS | Status: DC
Start: 2023-11-17 — End: 2023-11-23
  Filled 2023-11-17 (×3): qty 15

## 2023-11-17 MED ORDER — MAGNESIUM OXIDE 400 MG (241.3 MG MAGNESIUM) TABLET
400.0000 mg | ORAL_TABLET | Freq: Three times a day (TID) | ORAL | Status: DC
Start: 2023-11-17 — End: 2023-11-23
  Administered 2023-11-17 – 2023-11-23 (×19): 400 mg via ORAL
  Filled 2023-11-17 (×20): qty 1

## 2023-11-17 MED ORDER — FERROUS SULFATE 324 MG (65 MG IRON) TABLET,DELAYED RELEASE
324.0000 mg | DELAYED_RELEASE_TABLET | Freq: Every day | ORAL | Status: DC
Start: 2023-11-17 — End: 2023-11-23
  Administered 2023-11-17: 0 mg via ORAL
  Administered 2023-11-18 – 2023-11-23 (×6): 324 mg via ORAL
  Filled 2023-11-17 (×6): qty 1

## 2023-11-17 MED ORDER — DEXTROSE 50 % IN WATER (D50W) INTRAVENOUS SYRINGE
12.5000 g | INJECTION | INTRAVENOUS | Status: DC | PRN
Start: 2023-11-17 — End: 2023-11-23
  Administered 2023-11-17 (×2): 25 mL via INTRAVENOUS
  Filled 2023-11-17: qty 50

## 2023-11-17 MED ORDER — ATORVASTATIN 20 MG TABLET
20.0000 mg | ORAL_TABLET | Freq: Every evening | ORAL | Status: DC
Start: 2023-11-17 — End: 2023-11-23
  Administered 2023-11-17 – 2023-11-22 (×6): 20 mg via ORAL
  Filled 2023-11-17 (×7): qty 1

## 2023-11-17 MED ORDER — APIXABAN 5 MG TABLET
5.0000 mg | ORAL_TABLET | Freq: Two times a day (BID) | ORAL | Status: DC
Start: 2023-11-17 — End: 2023-11-23
  Administered 2023-11-17 – 2023-11-23 (×14): 5 mg via ORAL
  Filled 2023-11-17 (×12): qty 1

## 2023-11-17 MED ORDER — PYRIDOXINE (VITAMIN B6) 50 MG TABLET
250.0000 mg | ORAL_TABLET | Freq: Two times a day (BID) | ORAL | Status: DC
Start: 2023-11-17 — End: 2023-11-23
  Administered 2023-11-17: 250 mg via ORAL
  Administered 2023-11-17: 0 mg via ORAL
  Administered 2023-11-18 – 2023-11-23 (×11): 250 mg via ORAL
  Filled 2023-11-17 (×12): qty 5

## 2023-11-17 MED ORDER — HYDROCODONE 5 MG-ACETAMINOPHEN 325 MG TABLET
1.0000 | ORAL_TABLET | Freq: Four times a day (QID) | ORAL | Status: DC | PRN
Start: 2023-11-17 — End: 2023-11-21
  Administered 2023-11-17 – 2023-11-21 (×4): 1 via ORAL
  Filled 2023-11-17 (×3): qty 1

## 2023-11-17 MED ORDER — APIXABAN 5 MG TABLET
ORAL_TABLET | ORAL | Status: AC
Start: 2023-11-17 — End: 2023-11-17
  Filled 2023-11-17: qty 1

## 2023-11-17 MED ORDER — LOPERAMIDE 2 MG CAPSULE
2.0000 mg | ORAL_CAPSULE | Freq: Once | ORAL | Status: DC | PRN
Start: 2023-11-17 — End: 2023-11-23

## 2023-11-17 MED ORDER — METHYLPREDNISOLONE SOD SUCCINATE 40 MG/ML SOLUTION FOR INJ. WRAPPER
40.0000 mg | Freq: Three times a day (TID) | INTRAMUSCULAR | Status: DC
Start: 2023-11-18 — End: 2023-11-18
  Administered 2023-11-18: 40 mg via INTRAVENOUS
  Filled 2023-11-17: qty 1

## 2023-11-17 MED ORDER — BUSPIRONE 5 MG TABLET
ORAL_TABLET | ORAL | Status: AC
Start: 2023-11-17 — End: 2023-11-17
  Filled 2023-11-17: qty 1

## 2023-11-17 MED ORDER — INSULIN LISPRO 100 UNIT/ML SUB-Q - CHARGE BY DOSE
SUBCUTANEOUS | Status: AC
Start: 2023-11-17 — End: 2023-11-17
  Filled 2023-11-17: qty 7

## 2023-11-17 MED ORDER — HYDROXYZINE PAMOATE 25 MG CAPSULE
ORAL_CAPSULE | ORAL | Status: AC
Start: 2023-11-17 — End: 2023-11-17
  Filled 2023-11-17: qty 1

## 2023-11-17 MED ORDER — FUROSEMIDE 10 MG/ML INJECTION SOLUTION
10.0000 mg | INTRAMUSCULAR | Status: AC
Start: 2023-11-17 — End: 2023-11-17
  Administered 2023-11-17: 10 mg via INTRAVENOUS

## 2023-11-17 MED ORDER — MAGNESIUM HYDROXIDE 400 MG/5 ML ORAL SUSPENSION
30.0000 mL | Freq: Every day | ORAL | Status: DC | PRN
Start: 2023-11-17 — End: 2023-11-23

## 2023-11-17 MED ORDER — HYDROXYZINE PAMOATE 25 MG CAPSULE
25.0000 mg | ORAL_CAPSULE | ORAL | Status: AC
Start: 2023-11-17 — End: 2023-11-17
  Administered 2023-11-17: 25 mg via ORAL

## 2023-11-17 MED ORDER — FUROSEMIDE 10 MG/ML INJECTION SOLUTION
20.0000 mg | Freq: Two times a day (BID) | INTRAMUSCULAR | Status: DC
Start: 2023-11-17 — End: 2023-11-19
  Administered 2023-11-17 – 2023-11-19 (×5): 20 mg via INTRAVENOUS
  Filled 2023-11-17 (×7): qty 4

## 2023-11-17 MED ORDER — ESCITALOPRAM 10 MG TABLET
5.0000 mg | ORAL_TABLET | Freq: Every day | ORAL | Status: DC
Start: 2023-11-17 — End: 2023-11-23
  Administered 2023-11-17 – 2023-11-23 (×7): 5 mg via ORAL
  Filled 2023-11-17 (×6): qty 1

## 2023-11-17 MED ORDER — LORAZEPAM 2 MG/ML INJECTION WRAPPER
0.5000 mg | INTRAMUSCULAR | Status: AC
Start: 2023-11-17 — End: 2023-11-17
  Administered 2023-11-17: 0.5 mg via INTRAVENOUS

## 2023-11-17 MED ORDER — SODIUM CHLORIDE 0.9 % INJECTION SOLUTION
2.0000 mL | Freq: Once | INTRAVENOUS | Status: DC | PRN
Start: 2023-11-17 — End: 2023-11-23
  Administered 2023-11-17: 3 mL via INTRAVENOUS

## 2023-11-17 MED ORDER — PRAMIPEXOLE 0.25 MG TABLET
0.1250 mg | ORAL_TABLET | Freq: Three times a day (TID) | ORAL | Status: DC
Start: 2023-11-17 — End: 2023-11-17
  Filled 2023-11-17 (×3): qty 0.5

## 2023-11-17 MED ORDER — DEXTROSE 40 % ORAL GEL
15.0000 g | ORAL | Status: DC | PRN
Start: 2023-11-17 — End: 2023-11-23

## 2023-11-17 MED ORDER — LORAZEPAM 2 MG/ML INJECTION SOLUTION
INTRAMUSCULAR | Status: AC
Start: 2023-11-17 — End: 2023-11-17
  Filled 2023-11-17: qty 1

## 2023-11-17 MED ORDER — IPRATROPIUM 0.5 MG-ALBUTEROL 3 MG (2.5 MG BASE)/3 ML NEBULIZATION SOLN
3.0000 mL | INHALATION_SOLUTION | RESPIRATORY_TRACT | Status: DC | PRN
Start: 2023-11-17 — End: 2023-11-23
  Administered 2023-11-17 – 2023-11-19 (×4): 3 mL via RESPIRATORY_TRACT

## 2023-11-17 MED ORDER — METHYLPREDNISOLONE SOD SUCC 125 MG SOLUTION FOR INJECTION WRAPPER
125.0000 mg | INTRAVENOUS | Status: AC
Start: 2023-11-17 — End: 2023-11-17
  Administered 2023-11-17: 125 mg via INTRAVENOUS
  Filled 2023-11-17: qty 2

## 2023-11-17 MED ORDER — ESCITALOPRAM 10 MG TABLET
ORAL_TABLET | ORAL | Status: AC
Start: 2023-11-17 — End: 2023-11-17
  Filled 2023-11-17: qty 1

## 2023-11-17 MED ORDER — FUROSEMIDE 10 MG/ML INJECTION SOLUTION
INTRAMUSCULAR | Status: AC
Start: 2023-11-17 — End: 2023-11-17
  Filled 2023-11-17: qty 20

## 2023-11-17 MED ORDER — DEXTROSE 10 % IN WATER (D10W) INTRAVENOUS SOLUTION
INTRAVENOUS | Status: DC
Start: 2023-11-17 — End: 2023-11-18
  Administered 2023-11-17: 0 mL via INTRAVENOUS
  Filled 2023-11-17: qty 1000

## 2023-11-17 MED ORDER — INSULIN LISPRO 100 UNIT/ML SUB-Q - CHARGE BY DOSE
7.0000 [IU] | Freq: Once | SUBCUTANEOUS | Status: AC
Start: 2023-11-17 — End: 2023-11-17
  Administered 2023-11-17: 7 [IU] via SUBCUTANEOUS

## 2023-11-17 MED ORDER — DOCUSATE SODIUM 100 MG CAPSULE
100.0000 mg | ORAL_CAPSULE | Freq: Every day | ORAL | Status: DC
Start: 2023-11-17 — End: 2023-11-23
  Administered 2023-11-17 – 2023-11-23 (×7): 100 mg via ORAL
  Filled 2023-11-17 (×6): qty 1

## 2023-11-17 MED ORDER — INSULIN LISPRO 100 UNIT/ML SUB-Q SSIP VIAL
2.0000 [IU] | INJECTION | Freq: Four times a day (QID) | SUBCUTANEOUS | Status: DC
Start: 2023-11-17 — End: 2023-11-20
  Administered 2023-11-17 (×4): 0 [IU] via SUBCUTANEOUS
  Administered 2023-11-18 – 2023-11-19 (×5): 5 [IU] via SUBCUTANEOUS
  Administered 2023-11-19: 2 [IU] via SUBCUTANEOUS
  Administered 2023-11-19 (×2): 5 [IU] via SUBCUTANEOUS
  Administered 2023-11-20 (×2): 0 [IU] via SUBCUTANEOUS
  Filled 2023-11-17 (×3): qty 5
  Filled 2023-11-17: qty 2
  Filled 2023-11-17 (×4): qty 5

## 2023-11-17 MED ORDER — HYDROCODONE 5 MG-ACETAMINOPHEN 325 MG TABLET
ORAL_TABLET | ORAL | Status: AC
Start: 2023-11-17 — End: 2023-11-17
  Filled 2023-11-17: qty 1

## 2023-11-17 MED ORDER — BUSPIRONE 5 MG TABLET
5.0000 mg | ORAL_TABLET | Freq: Every day | ORAL | Status: DC
Start: 2023-11-17 — End: 2023-11-23
  Administered 2023-11-17 – 2023-11-23 (×7): 5 mg via ORAL
  Filled 2023-11-17 (×6): qty 1

## 2023-11-17 NOTE — ED Nurses Note (Signed)
 Patient placed in hospital bed for comfort. Patient placed in clean dry brief and repositioned in bed for comfort at this time. No other concerns. Call bell within reach.

## 2023-11-17 NOTE — ED Nurses Note (Addendum)
 Patient appears very restless and anxious. Patient keeps pulling off oxygen, and cardiac monitor. This nurse has redirected patient, and put oxygen and cardiac monitor back on multiple times. Provider made aware. Awaiting orders.

## 2023-11-17 NOTE — Nurses Notes (Signed)
 Patient accepted to room 314-A at this time.

## 2023-11-17 NOTE — Progress Notes (Signed)
 [place] MEDICINE [facility]    HOSPITALIST PROGRESS NOTE    Jordan Buckley  Date of service: 11/17/2023  Date of Admission:  11/16/2023  Hospital Day:  LOS: 0 days     Subjective:   Patient was admitted on 11/17/2023 after presenting to the ER from [facility] with complaints of cough, congestion and increased shortness a breath.  Family states patient has had recurrent pneumonia and was recently treated with Augmentin, however she had an allergic reaction to Augmentin and was switched to another antibiotic.  Patient has taken antibiotics with no improvement.  Family states patient is not typically on oxygen but has been using oxygen 2-4 L nasal cannula for the past several days.  Family states that patient has never been diagnosed with CHF however patient has edema and slightly elevated BNP at 2:10 a.m.Marland Kitchen  She has history of cardiac surgery and chronic obstructive pulmonary disease.    Vital Signs:  Filed Vitals:    11/17/23 1215 11/17/23 1246 11/17/23 1249 11/17/23 1442   BP:   (!) 118/98 (!) 134/103   Pulse: (!) 110 (!) 112 (!) 112 (!) 110   Resp:   20 (!) 24   Temp:   36.8 C (98.3 F) 36.7 C (98.1 F)   SpO2: 94%               Physical Exam       Intake & Output:    Intake/Output Summary (Last 24 hours) at 11/17/2023 1539  Last data filed at 11/17/2023 1432  Gross per 24 hour   Intake 50 ml   Output --   Net 50 ml     I/O current shift:  04/08 0700 - 04/08 1859  In: 50 [I.V.:50]  Out: -   Emesis:    BM:    Date of Last Bowel Movement: 11/16/23  Heme:      apixaban (ELIQUIS) tablet, 5 mg, Oral, 2x/day  atorvastatin (LIPITOR) tablet, 20 mg, Oral, NIGHTLY  bisacodyl (DULCOLAX) enteric coated tablet, 5 mg, Oral, Q72H PRN  busPIRone (BUSPAR) tablet, 5 mg, Oral, Daily  Correction/SSIP insulin lispro 100 units/mL injection, 2-9 Units, Subcutaneous, 4x/day AC  D10W premixed infusion, , Intravenous, Continuous  dextromethorphan-guaiFENesin (ROBITUSSIN DM) 10-100mg  per 5mL oral liquid, 10 mL, Oral,  Q4H PRN  dextrose (GLUTOSE) 40% oral gel, 15 g, Oral, Q15 Min PRN  dextrose 50% (0.5 g/mL) injection - syringe, 12.5 g, Intravenous, Q15 Min PRN  docusate sodium (COLACE) capsule, 100 mg, Oral, Daily  escitalopram (LEXAPRO) tablet, 5 mg, Oral, Daily  ferrous sulfate 324 mg (65 mg elemental IRON) tablet, 324 mg, Oral, Daily  furosemide (LASIX) 10 mg/mL injection, 20 mg, Intravenous, Q12H  furosemide (LASIX) 10 mg/mL injection, 40 mg, Intravenous, Once  glucagon injection 1 mg, 1 mg, IntraMUSCULAR, Once PRN  HYDROcodone-acetaminophen (NORCO) 5-325 mg per tablet, 1 Tablet, Oral, Q6H PRN  ipratropium-albuterol 0.5 mg-3 mg(2.5 mg base)/3 mL Solution for Nebulization, 3 mL, Nebulization, Q4H PRN  levothyroxine (SYNTHROID) tablet, 75 mcg, Oral, QAM  loperamide (IMODIUM) capsule, 2 mg, Oral, Once PRN  magnesium hydroxide (MILK OF MAGNESIA) 400mg  per 5mL oral liquid, 30 mL, Oral, Daily PRN  magnesium oxide (MAG-OX) 400mg  (241.3 mg elemental magnesium) tablet, 400 mg, Oral, 3x/day  nystatin (NYSTOP) 100,000 units/g topical powder, , Apply Topically, 2x/day  perflutren lipid microspheres (DEFINITY) 1.3 mL in NS 10 mL (tot vol) injection, 2 mL, Intravenous, Cardiology Once PRN  pramipexole (MIRAPEX) tablet, 0.125 mg, Oral, 3x/day  pyridOXINE Vitamin B6  tablet, 250 mg, Oral, 2x/day  ramelteon (ROZEREM) tablet, 8 mg, Oral, NIGHTLY  zinc oxide 56.7 g, clotrimazole (LOTRIMIN) 15 g, hydrocortisone (CORTIZONE-10) 28 g compounded topical cream, , Apply Topically, Q30 Min PRN          Labs:  Recent Results (from the past 48 hours)   MANUAL DIFFERENTIAL    Collection Time: 11/17/23  8:28 AM   Result Value    WBC 3.8   CBC WITH DIFF    Collection Time: 11/17/23  8:28 AM   Result Value    WBC 3.8    HGB 11.3    HCT 34.7    PLATELETS 123 (L)      Results for orders placed or performed during the hospital encounter of 11/16/23 (from the past 48 hours)   BASIC METABOLIC PANEL, NON-FASTING    Collection Time: 11/17/23  8:29 AM   Result Value     SODIUM 139    POTASSIUM 5.0    CHLORIDE 100    CO2 TOTAL 21    GLUCOSE 50 (L)    BUN 32 (H)    CREATININE 1.19      Recent Results (from the past 48 hours)   COMPREHENSIVE METABOLIC PANEL, NON-FASTING    Collection Time: 11/17/23  8:28 AM   Result Value    ALKALINE PHOSPHATASE 79    ALT (SGPT) 70 (H)    AST (SGOT) 68 (H)   COMPREHENSIVE METABOLIC PANEL, NON-FASTING    Collection Time: 11/16/23  8:25 PM   Result Value    ALKALINE PHOSPHATASE 84    ALT (SGPT) 73 (H)    AST (SGOT) 68 (H)      Results for orders placed or performed during the hospital encounter of 11/16/23 (from the past 48 hours)   TROPONIN-I    Collection Time: 11/17/23  8:28 AM   Result Value    TROPONIN I 18 (H)   B-TYPE NATRIURETIC PEPTIDE (BNP),PLASMA    Collection Time: 11/16/23  8:25 PM   Result Value    BNP 210 (H)      No results found for this or any previous visit (from the past 48 hours).   Results for orders placed or performed during the hospital encounter of 11/16/23 (from the past 8 weeks)   HGA1C (HEMOGLOBIN A1C WITH EST AVG GLUCOSE)    Collection Time: 11/17/23  8:28 AM   Result Value    HEMOGLOBIN A1C 11.2 (H)      No results found for this or any previous visit (from the past 48 hours).     Microbiology:  No results found for any visits on 11/16/23 (from the past 96 hours).    Imaging:   US ABDOMEN (FOR ASCITES)  Narrative: Jordan Buckley    EXAM DESCRIPTION: ABDOMINAL ULTRASOUND FOR ASCITES    CLINICAL HISTORY: Ascites; abdominal distention    COMPARISON: 12/21/2022    FINDINGS:     Ultrasound images of the abdomen were obtained. No ascites is noted at this time.  Impression: No ascites is noted on the current study.    Radiologist location ID: [id number]      Assessment/ Plan:   Active Hospital Problems   (*Primary Problem)    Diagnosis    *Fluid overload    COPD (chronic obstructive pulmonary disease)    CVA (cerebrovascular accident)     History      Depression    Metabolic encephalopathy    Alzheimer's disease,  unspecified (CODE)  Diabetes mellitus, type 2     Chronic    Coronary artery disease    Hypothyroidism       Nutrition:    DIET DIABETIC Calorie amount: CC 1800; Do you want to initiate MNT Protocol? Yes; Restrict fluids to: 1200 ML    Additional clinical characteristics related to nutrition:    - monitor for weight changes   - monitor intake and output    - monitor bowel functions          Disposition Planning:     Fluid overload  Hypoglycemia  Metabolic encephalopathy  Chronic obstructive pulmonary disease  History of CVA  Dementia  Diabetes type 2  History of CAD  Hypothyroidism  Depression    -echocardiogram pending  -IV Lasix  -D10 at 15 mL/hour  -q.12 hours blood sugars  -urine culture pending  -strict I&O  -home medications as appropriate  -start ramelteon tonight  -aspiration precaution  -avoid benzodiazepines    DVT px: Eliquis    The Hospitalist personally evaluated and examined the patient in conjunction with the MLP and agree with the assessments, treatment plan and disposition of the patient as recorded by the [facility].   Farrel Gobble, NP-C  11/17/2023  [facility] MEDICINE HOSPITALIST

## 2023-11-17 NOTE — ED Nurses Note (Signed)
 Patient resting in hospital bed with eyes closed. Respirations even and non labored, no acute distress noted. Call bell within reach.

## 2023-11-17 NOTE — ED Nurses Note (Addendum)
 Provider made aware of BS at this time.  Awaiting orders.

## 2023-11-17 NOTE — H&P (Signed)
 [facility]  History and Physical    Date of Service:  11/17/2023  Jordan Buckley, Jordan Buckley, 83 y.o. female  Encounter Start Date:  11/16/2023  Inpatient Admission Date: 11/17/2023  Date of Birth:  06/06/1941  PCP: Vernell Barrier, PA-C       Chief Complaint:  Increased SOB    HPI: Jordan Buckley is a 83 y.o., White female who presents with increased shortness of breath.  Patient was seen and examined at bedside.  Patient was brought to the ED by EMS from a local nursing home due to cough, congestion, and increased shortness of breath.  Family said that patient has had recurrent pneumonia was treated with Augmentin, then patient had an allergic reaction and then was switched to another antibiotic.  Patient is taken to antibiotics with no improvement.  Today in the ED family noticed the patient was extremely short of breath.  Patient is not normally O2 dependent but she has been having to use O2 pretty much all the time anywhere from 2-4 L via NC.  Patient has never been diagnosed with CHF however patient is edematous and has an elevated BNP of 210.  Patient does have a history of a cardiac surgery as well as chronic obstructive pulmonary disease and asthma.  Patient has a poor historian.  Patient denies any other complaints at this time.  Patient will be admitted to hospitalist services for further evaluation.    Past Medical History:    Past Medical History:   Diagnosis Date    Alzheimer's dementia     Arthropathy     Asthma     Cancer (CMS HCC)     COPD (chronic obstructive pulmonary disease)     Coronary artery disease     CVA (cerebrovascular accident)     Depression     Diabetes mellitus, type 2     Hard of hearing     HTN (hypertension)     Osteoporosis     Thyroid disease     Wears glasses              Medications Prior to Admission       Prescriptions    acetaminophen (TYLENOL) 325 mg Oral Tablet    Take 2 Tablets (650 mg total) by mouth Every 4 hours as needed for Pain or Fever    apixaban (ELIQUIS) 5 mg  Oral Tablet    Take 1 Tablet (5 mg total) by mouth Twice daily    atorvastatin (LIPITOR) 20 mg Oral Tablet    Take 1 Tablet (20 mg total) by mouth Every night    benzonatate (TESSALON) 100 mg Oral Capsule    Take 1 Capsule (100 mg total) by mouth Every 8 hours as needed for Cough    bisacodyL (DULCOLAX) 5 mg Oral Tablet, Delayed Release (E.C.)    Take 1 Tablet (5 mg total) by mouth Every 72 hours as needed for Constipation    busPIRone (BUSPAR) 5 mg Oral Tablet    Take 1 Tablet (5 mg total) by mouth Once a day    calcium carbonate-vitamin D3 (CALCIUM 600 + D) 600 mg-10 mcg (400 unit) Oral Tablet    Take 1 Tablet by mouth Twice daily    dextromethorphan-guaiFENesin (ROBAFEN DM COUGH) 10-100 mg/5 mL Oral Liquid    Take 10 mL by mouth Every 4 hours as needed    docusate sodium (COLACE) 100 mg Oral Capsule    Take 1 Capsule (100 mg total) by mouth Once a  day    ergocalciferol, vitamin D2, (DRISDOL) 1,250 mcg (50,000 unit) Oral Capsule    Take 1 Capsule (50,000 Units total) by mouth On the first of the month    escitalopram oxalate (LEXAPRO) 5 mg Oral Tablet    Take 1 Tablet (5 mg total) by mouth Once a day    ferrous sulfate (FEOSOL) 325 mg (65 mg iron) Oral Tablet    Take 1 Tablet (325 mg total) by mouth Once a day    HYDROcodone-acetaminophen (NORCO) 5-325 mg Oral Tablet    Take 1 Tablet by mouth Every 6 hours as needed for Pain    JANUVIA 100 mg Oral Tablet    Take 1 Tablet (100 mg total) by mouth Once a day    levothyroxine (SYNTHROID) 75 mcg Oral Tablet    Take 1 Tablet (75 mcg total) by mouth Every morning    lidocaine (LIDODERM) 5 % Adhesive Patch, Medicated    Place 1 Patch (700 mg total) on the skin Once a day    loperamide (IMODIUM) 2 mg Oral Capsule    Take 1 Capsule (2 mg total) by mouth Once, as needed After each loose stool, not to exceed 4 caplets in 24 hours    mag hydrox/aluminum hyd/simeth (RULOX ORAL)    Take 20 mL by mouth Every 4 hours as needed No more than 80ml in a 24 hour period    magnesium  hydroxide (MILK OF MAGNESIA) 400 mg/5 mL Oral Suspension    Take 30 mL (2,400 mg total) by mouth Once per day as needed    magnesium oxide (MAG-OX) 400 mg Oral Tablet    Take 1 Tablet (400 mg total) by mouth Three times a day    miconazole nitrate (SECURA) 2 % Cream    Apply topically Three times a day as needed    mineral oil (FLEET) Rectal Enema    Insert 133 mL into the rectum Every 72 hours as needed    NOVOLOG U-100 INSULIN ASPART 100 unit/mL Subcutaneous Solution    Inject under the skin Per sliding scale    ondansetron (ZOFRAN ODT) 4 mg Oral Tablet, Rapid Dissolve    Take 1 Tablet (4 mg total) by mouth Every 8 hours as needed for Nausea/Vomiting    pramipexole (MIRAPEX) 0.125 mg Oral Tablet    Take 1 Tablet (0.125 mg total) by mouth Three times a day    Pyridoxine (VITAMIN B6) 250 mg Oral Tablet    Take 1 Tablet (250 mg total) by mouth Twice daily          Allergies   Allergen Reactions    Cefaclor Rash    Nitrofurantoin Rash    Quinine Rash    Sulfa (Sulfonamides) Rash    Ciprofloxacin     Dulaglutide     Fluconazole     Nitrofurantoin Macrocrystal     Quinidine-Quinine Analogues (Cinchona Alkaloids)        Past Surgical History:  Past Surgical History:   Procedure Laterality Date    FIXATION KYPHOPLASTY Right 11/27/2021    HX BACK SURGERY      HX CATARACT REMOVAL      HX CESAREAN SECTION      HX CHOLECYSTECTOMY      HX CORONARY ARTERY BYPASS GRAFT      HX HYSTERECTOMY      KIDNEY SURGERY Left     cancerous tumor removed    LIVER RESECTION      WRIST SURGERY Left  Family History:  Family Medical History:       Problem Relation (Age of Onset)    No Known Problems Mother, Father               Social History:  Social History     Tobacco Use    Smoking status: Never    Smokeless tobacco: Never   Vaping Use    Vaping status: Never Used   Substance Use Topics    Alcohol use: Never    Drug use: Never        Review of Systems:  All systems are reviewed and are negative except those mentioned in the HPI  portion    Examination:  BP (!) 157/75   Pulse (!) 109   Temp 36.7 C (98.1 F)   Resp 13   Ht 1.626 m (5\' 4" )   Wt 61.2 kg (135 lb)   SpO2 96%   BMI 23.17 kg/m         General: Patient is alert and oriented to person and place.    HEENT: Pupils are of round shape, equal in size, and reactive to light bilaterally. Oral mucous membranes are moist.  Hard of hearing.    Heart: S1 and S2 are present. No appreciable murmur.    Lungs: Breath sounds are appreciated at all posterior lung fields, no appreciable crackles, wheezes, or rhonchi.    Gastrointestinal: Bowel sounds are appreciated at all 4 quadrants. Abdomen is soft, not appreciably distended, non-tender to palpation at all quadrants.  Edema noted to abdomen.    Extremities: Radial pulses are 3/4 bilaterally, dorsalis pedis pulses are 3/4 bilaterally. Capillary refill is less than 3 seconds at distal digits bilaterally. Appreciable edema of the lower extremities.    Genitourinary: No appreciable suprapubic tenderness.    Neurologic: Follows commands appropriately. No appreciable facial droop. No appreciable focal weakness of the bilateral upper or lower extremities.    Skin: Grossly intact at observable areas.    Labs:    Lab Results Today:    Results for orders placed or performed during the hospital encounter of 11/16/23 (from the past 24 hours)   C-REACTIVE PROTEIN(CRP),INFLAMMATION   Result Value Ref Range    C-REACTIVE PROTEIN (CRP) <0.5 0.1 - 0.5 mg/dL   COMPREHENSIVE METABOLIC PANEL, NON-FASTING   Result Value Ref Range    SODIUM 135 (L) 136 - 145 mmol/L    POTASSIUM 5.1 3.5 - 5.1 mmol/L    CHLORIDE 98 98 - 107 mmol/L    CO2 TOTAL 27 21 - 31 mmol/L    ANION GAP 10 4 - 13 mmol/L    BUN 31 (H) 7 - 25 mg/dL    CREATININE 1.61 0.96 - 1.30 mg/dL    BUN/CREA RATIO 25 (H) 6 - 22    ESTIMATED GFR 44 (L) >59 mL/min/1.5m^2    ALBUMIN 3.8 3.5 - 5.7 g/dL    CALCIUM 8.7 8.6 - 04.5 mg/dL    GLUCOSE 409 (H) 74 - 109 mg/dL    ALKALINE PHOSPHATASE 84 34 - 104 U/L     ALT (SGPT) 73 (H) 7 - 52 U/L    AST (SGOT) 68 (H) 13 - 39 U/L    BILIRUBIN TOTAL 0.6 0.3 - 1.0 mg/dL    PROTEIN TOTAL 6.6 6.4 - 8.9 g/dL    ALBUMIN/GLOBULIN RATIO 1.4 0.8 - 1.4    OSMOLALITY, CALCULATED 286 270 - 290 mOsm/kg    CALCIUM, CORRECTED 8.9 8.9 - 10.8 mg/dL  GLOBULIN 2.8 2.0 - 3.5   LACTIC ACID LEVEL W/ REFLEX FOR LEVEL >2.0   Result Value Ref Range    LACTIC ACID 0.8 0.5 - 2.2 mmol/L   CBC WITH DIFF   Result Value Ref Range    WBC 4.6 3.8 - 11.8 x10^3/uL    RBC 3.59 (L) 3.63 - 4.92 x10^6/uL    HGB 11.2 10.9 - 14.3 g/dL    HCT 16.1 09.6 - 04.5 %    MCV 96.0 (H) 75.5 - 95.3 fL    MCH 31.1 24.7 - 32.8 pg    MCHC 32.4 32.3 - 35.6 g/dL    RDW 40.9 81.1 - 91.4 %    PLATELETS 125 (L) 140 - 440 x10^3/uL    MPV 7.2 (L) 7.9 - 10.8 fL    NEUTROPHIL % 93 (H) 43 - 77 %    LYMPHOCYTE % 4 (L) 16 - 46 %    MONOCYTE % 3 (L) 4 - 11 %    EOSINOPHIL % 0 (L) 1 - 7 %    BASOPHIL % 0 0 - 1 %    NEUTROPHIL # 4.20 1.90 - 8.20 x10^3/uL    LYMPHOCYTE # 0.20 (L) 1.10 - 3.10 x10^3/uL    MONOCYTE # 0.10 (L) 0.20 - 0.90 x10^3/uL    EOSINOPHIL # 0.00 0.00 - 0.50 x10^3/uL    BASOPHIL # 0.00 0.00 - 0.10 x10^3/uL   B-TYPE NATRIURETIC PEPTIDE (BNP),PLASMA   Result Value Ref Range    BNP 210 (H) 1 - 100 pg/mL   TROPONIN-I   Result Value Ref Range    TROPONIN I 18 (H) <15 ng/L   SCAN DIFFERENTIAL   Result Value Ref Range    RBC MORPHOLOGY COMMENT Normal     PLATELET MORPHOLOGY COMMENT Adequate    COVID-19, FLU A/B, RSV RAPID BY PCR   Result Value Ref Range    SARS-CoV-2 Not Detected Not Detected    INFLUENZA VIRUS TYPE A Not Detected Not Detected    INFLUENZA VIRUS TYPE B Not Detected Not Detected    RESPIRATORY SYNCTIAL VIRUS (RSV) Not Detected Not Detected   URINALYSIS, MACROSCOPIC   Result Value Ref Range    COLOR Light Yellow Colorless, Light Yellow, Yellow    APPEARANCE Clear Clear    SPECIFIC GRAVITY 1.018 1.002 - 1.030    PH 5.5 5.0 - 9.0    LEUKOCYTES 75 (A) Negative, 100  WBCs/uL    NITRITE Negative Negative    PROTEIN 20 Negative,  10 , 20  mg/dL    GLUCOSE 7829 (A) Negative, 30  mg/dL    KETONES Negative Negative, Trace mg/dL    BILIRUBIN Negative Negative, 0.5 mg/dL    BLOOD Negative Negative, 0.03 mg/dL    UROBILINOGEN Normal Normal mg/dL   URINALYSIS, MICROSCOPIC   Result Value Ref Range    RBCS 1 <4 /hpf    WBCS 2 <6 /hpf    HYALINE CASTS 8 (H) <0 /lpf    SQUAMOUS EPITHELIAL 2 <28 /hpf       Imaging Studies:  Results for orders placed or performed during the hospital encounter of 11/16/23 (from the past 24 hours)   XR AP MOBILE CHEST     Status: None    Narrative    Elianna PEARL Beth    RADIOLOGIST: Ann Maki, MD    XR AP MOBILE CHEST performed on 11/16/2023 8:53 PM    CLINICAL HISTORY: cough, dyspnea.    TECHNIQUE: Frontal view of the  chest.    COMPARISON:  11/04/2023, 02/07/2023    FINDINGS:  Sternotomy wires are present.   Cardiac and mediastinal contours are stable.   There are mild chronic bibasilar opacities, and the lung bases are underexpanded.  There are no acute pulmonary opacities or pleural effusions.         Impression    NO ACUTE FINDINGS.            Radiologist location ID: [id number]          Assessment/Plan:   Active Hospital Problems    Diagnosis    Primary Problem: Fluid overload     Fluid overload  -IV Lasix 20 mg q.12h   -daily weight, strict I's and O's, fluid restriction 1200 cc/day  -echocardiogram in a.m.  -trend troponins  -consult tele cardiology to evaluate patient in a.m.  -plan to also get a US Abdomen to rule out ascites in a.m.    Plan to admit patient MIP.  Telemetry, continuous pulse ox, and supplemental oxygen titration p.r.n. will be ordered.  Will verify patient's home medications.  Accu-Cheks AC and HS with sliding scale coverage.  Diabetic low-sodium diet.  Plan repeat labs in a.m.Marland Kitchen  See attending addendum and orders for further information.    DVT/PE Prophylaxis: SCDs/ Venodynes/Impulse boots and Apixaban    Laurita Quint, FNP-BC    Contents of the document, in whole or in part, are  completed utilizing M*Modal dictation technology, please forgive any typographical errors that may exist.

## 2023-11-17 NOTE — Care Plan (Signed)
 Patient admitted to telemetry today 11/17/23 with fluid overload. Being treated with IV lasix and supplemental oxygen. Patient is from [facility]. She is alert and oriented x3, disoriented to time. Currently on 3 liters via oxymask. She continues to remove the nasal cannula, therefore oxymask was placed. Tolerating better. Incontinent of bowel and bladder. Turn q2h. Accu check q2h. Vitals q4h. Bed alarm is on. Call light in reach. Patient education ongoing.           Problem: Adult Inpatient Plan of Care  Goal: Patient-Specific Goal (Individualized)  Outcome: Ongoing (see interventions/notes)  Flowsheets (Taken 11/17/2023 1357)  Individualized Care Needs: monitor labs and vitals, assist with ADLs, incont. care, accu checks  Anxieties, Fears or Concerns: none voiced at this time     Problem: Adult Inpatient Plan of Care  Goal: Absence of Hospital-Acquired Illness or Injury  Intervention: Prevent Skin Injury  Recent Flowsheet Documentation  Taken 11/17/2023 1356 by Vito Berger  Skin Protection:   adhesive use limited   incontinence pads utilized   tubing/devices free from skin contact

## 2023-11-17 NOTE — Nurses Notes (Signed)
 Patient POC glucose is 53. PRN dextrose given. Snack provided as well.

## 2023-11-17 NOTE — Respiratory Therapy (Signed)
 11/17/23 0630   Aerosol Therapy (SVN)   Start Time 0630   Treatment Status Given   Route (Aerosol Therapy) mask;with oxygen   $ Respiratory Treatment (Resp only) Neb (Sub)   Medications DuoNeb (albuterol-atrovent)   Signs of Intolerance (SVN) none   Respiratory Pre/Post-Treatment Assess   Pre-Treatment Heart Rate (beats/min) 112   Pre-Treatment Resp Rate (breaths/min) 20   Post-treatment Heart Rate (beats/min) 112   Post-treatment Resp Rate (breaths/min) 20   Device (Oxygen Therapy) nasal cannula   Flow (L/min) (Oxygen Therapy) 4     Order for prn treatment given

## 2023-11-17 NOTE — ED Nurses Note (Signed)
 Patient appears calmer and is resting quietly in hospital bed with eyes closed. Respirations even and non labored, no acute distress noted. Call bell within reach.

## 2023-11-17 NOTE — ED Nurses Note (Signed)
 Per Verbal order from provider, give patient 7 units of insulin lispro at this time and recheck BS at 0400.

## 2023-11-17 NOTE — Respiratory Therapy (Signed)
 11/17/23 2219   Non-Invasive Ventilation Assessment   Start Time 2216   Orders Updated in EMR Yes   $ NIV Check I   NIV Status Placed On Bipap   $ O2 Delivery BP   Oxygen Concentration (%) 30   Oxygen Noting/Safety Checks Oxygen Apparatus Checked and Functioning Properly;Oxygen Flowmeter Checked and Functioning Properly;Humidified ;Water [employer] Initial   Proximal  Temperature   (passover humidification)   H2O Bag Initial   Hepa Filter Initial   Non-Invasive Ventilation Check   Type of Mask FFM   NG/OG Placed No   Facial Skin Integrity WNL   Device V-60    Mode S/T   Spontaneous Volume 517 mL   Set Rate 12   Spontaneous Rate 3 Breaths Per Minute   Minute Volume 7.5 LPM   FiO2 Set 30%   IPAP 18 cmH20   EPAP 6 cmH2O   PIP 18 cmH2O   Rise Time % 3   I-Time 0.9 seconds   SpO2 93 %   Alarms   Audible Alarms Checked & Functioning   Hi Rate 40   Lo Rate 5   Hi Pressure 40 cmH2O   Lo Pressure 5 cmH2O   Low Volume 100 m L   Leak 7 mL   Apnea Alarm 20 Seconds   Low Min Ventilation 2.0   Timepoint   Stop Time 2221   Check Duration 5 minutes       Pt was placed on BiPAP due to the blood gas

## 2023-11-17 NOTE — ED Nurses Note (Signed)
 Patient requesting pain medication. Patient complaining on burning and pain in legs, rates 6/10.

## 2023-11-17 NOTE — Respiratory Therapy (Signed)
 Latest Reference Range & Units 11/17/23 21:44   %FIO2 (ARTERIAL) % 32   PH 7.35 - 7.45  7.32 (L)   PCO2 35 - 45 mm/Hg 67 (H)   PO2 83 - 108 mm/Hg 97   BICARBONATE 21.0 - 28.0 mmol/L 30.1 (H)   BASE EXCESS 0.0 - 3.0 mmol/L 6.6 (H)   PAO2/FIO2 RATIO  303   O2CT % 14.9   O2 SATURATION (ARTERIAL) 94.0 - 98.0 % 97.0   HEMATOCRITRT 37 - 50 % 33 (L)   CARBOXYHEMOGLOBIN <=3.0 % 1.2   HEMOGLOBIN 12.0 - 18.0 g/dL 47.8 (L)   MET-HEMOGLOBIN <=1.5 % <0.7   O2HB 90.0 - 95.0 % 96.2 (H)   SODIUM 136 - 145 mmol/L 132 (L)   LACTATE <=1.9 mmol/L 0.6   CHLORIDE 98 - 107 mmol/L 98   GLUCOSE 65 - 125 mg/dL 295 (H)   IONIZED CALCIUM 1.15 - 1.33 mmol/L 1.19   WHOLE BLOOD K+ 3.5 - 5.1 mmol/L 4.9   (L): Data is abnormally low  (H): Data is abnormally high    Pt was wearing 3lpm OM

## 2023-11-18 DIAGNOSIS — Z7901 Long term (current) use of anticoagulants: Secondary | ICD-10-CM

## 2023-11-18 DIAGNOSIS — F32A Depression, unspecified: Secondary | ICD-10-CM

## 2023-11-18 DIAGNOSIS — E039 Hypothyroidism, unspecified: Secondary | ICD-10-CM

## 2023-11-18 DIAGNOSIS — E11649 Type 2 diabetes mellitus with hypoglycemia without coma: Secondary | ICD-10-CM

## 2023-11-18 DIAGNOSIS — F0280 Dementia in other diseases classified elsewhere without behavioral disturbance: Secondary | ICD-10-CM

## 2023-11-18 DIAGNOSIS — I2510 Atherosclerotic heart disease of native coronary artery without angina pectoris: Secondary | ICD-10-CM

## 2023-11-18 DIAGNOSIS — J449 Chronic obstructive pulmonary disease, unspecified: Secondary | ICD-10-CM

## 2023-11-18 DIAGNOSIS — G309 Alzheimer's disease, unspecified: Secondary | ICD-10-CM

## 2023-11-18 DIAGNOSIS — G9341 Metabolic encephalopathy: Secondary | ICD-10-CM

## 2023-11-18 DIAGNOSIS — E875 Hyperkalemia: Secondary | ICD-10-CM

## 2023-11-18 DIAGNOSIS — Z8673 Personal history of transient ischemic attack (TIA), and cerebral infarction without residual deficits: Secondary | ICD-10-CM

## 2023-11-18 LAB — BASIC METABOLIC PANEL
ANION GAP: 5 mmol/L (ref 4–13)
BUN/CREA RATIO: 36 — ABNORMAL HIGH (ref 6–22)
BUN: 50 mg/dL — ABNORMAL HIGH (ref 7–25)
CALCIUM: 8.8 mg/dL (ref 8.6–10.3)
CHLORIDE: 96 mmol/L — ABNORMAL LOW (ref 98–107)
CO2 TOTAL: 34 mmol/L — ABNORMAL HIGH (ref 21–31)
CREATININE: 1.4 mg/dL — ABNORMAL HIGH (ref 0.60–1.30)
ESTIMATED GFR: 37 mL/min/{1.73_m2} — ABNORMAL LOW (ref >59)
GLUCOSE: 244 mg/dL — ABNORMAL HIGH (ref 74–109)
OSMOLALITY, CALCULATED: 292 mosm/kg — ABNORMAL HIGH (ref 270–290)
POTASSIUM: 4.5 mmol/L (ref 3.5–5.1)
SODIUM: 135 mmol/L — ABNORMAL LOW (ref 136–145)

## 2023-11-18 LAB — CBC WITH DIFF
BASOPHIL #: 0 10*3/uL (ref 0.00–0.10)
BASOPHIL %: 0 % (ref 0–1)
EOSINOPHIL #: 0 10*3/uL (ref 0.00–0.50)
EOSINOPHIL %: 0 % — ABNORMAL LOW (ref 1–7)
HCT: 33.2 % (ref 31.2–41.9)
HGB: 11 g/dL (ref 10.9–14.3)
LYMPHOCYTE #: 0.3 10*3/uL — ABNORMAL LOW (ref 1.10–3.10)
LYMPHOCYTE %: 8 % — ABNORMAL LOW (ref 16–46)
MCH: 31.6 pg (ref 24.7–32.8)
MCHC: 33.2 g/dL (ref 32.3–35.6)
MCV: 95.2 fL (ref 75.5–95.3)
MONOCYTE #: 0.1 10*3/uL — ABNORMAL LOW (ref 0.20–0.90)
MONOCYTE %: 2 % — ABNORMAL LOW (ref 4–11)
MPV: 8.2 fL (ref 7.9–10.8)
NEUTROPHIL #: 3.9 10*3/uL (ref 1.90–8.20)
NEUTROPHIL %: 91 % — ABNORMAL HIGH (ref 43–77)
PLATELETS: 132 10*3/uL — ABNORMAL LOW (ref 140–440)
RBC: 3.49 10*6/uL — ABNORMAL LOW (ref 3.63–4.92)
RDW: 15.3 % (ref 12.3–17.7)
WBC: 4.3 10*3/uL (ref 3.8–11.8)

## 2023-11-18 LAB — COMPREHENSIVE METABOLIC PANEL, NON-FASTING
ALBUMIN/GLOBULIN RATIO: 1.4 (ref 0.8–1.4)
ALBUMIN: 3.7 g/dL (ref 3.5–5.7)
ALKALINE PHOSPHATASE: 66 U/L (ref 34–104)
ALT (SGPT): 74 U/L — ABNORMAL HIGH (ref 7–52)
ANION GAP: 13 mmol/L (ref 4–13)
AST (SGOT): 72 U/L — ABNORMAL HIGH (ref 13–39)
BILIRUBIN TOTAL: 0.9 mg/dL (ref 0.3–1.0)
BUN/CREA RATIO: 33 — ABNORMAL HIGH (ref 6–22)
BUN: 41 mg/dL — ABNORMAL HIGH (ref 7–25)
CALCIUM, CORRECTED: 8.9 mg/dL (ref 8.9–10.8)
CALCIUM: 8.7 mg/dL (ref 8.6–10.3)
CHLORIDE: 94 mmol/L — ABNORMAL LOW (ref 98–107)
CO2 TOTAL: 31 mmol/L (ref 21–31)
CREATININE: 1.24 mg/dL (ref 0.60–1.30)
ESTIMATED GFR: 43 mL/min/{1.73_m2} — ABNORMAL LOW (ref >59)
GLOBULIN: 2.7 (ref 2.0–3.5)
GLUCOSE: 291 mg/dL — ABNORMAL HIGH (ref 74–109)
OSMOLALITY, CALCULATED: 297 mosm/kg — ABNORMAL HIGH (ref 270–290)
POTASSIUM: 5.5 mmol/L — ABNORMAL HIGH (ref 3.5–5.1)
PROTEIN TOTAL: 6.4 g/dL (ref 6.4–8.9)
SODIUM: 138 mmol/L (ref 136–145)

## 2023-11-18 LAB — PROCALCITONIN: PROCALCITONIN: 0.03 ng/mL (ref <0.50)

## 2023-11-18 LAB — MAGNESIUM: MAGNESIUM: 1.8 mg/dL — ABNORMAL LOW (ref 1.9–2.7)

## 2023-11-18 LAB — SCAN DIFFERENTIAL: PLATELET MORPHOLOGY COMMENT: NORMAL

## 2023-11-18 LAB — POC BLOOD GLUCOSE (RESULTS)
GLUCOSE, POC: 231 mg/dL — ABNORMAL HIGH (ref 70–100)
GLUCOSE, POC: 255 mg/dL — ABNORMAL HIGH (ref 70–100)
GLUCOSE, POC: 267 mg/dL — ABNORMAL HIGH (ref 70–100)
GLUCOSE, POC: 270 mg/dL — ABNORMAL HIGH (ref 70–100)
GLUCOSE, POC: 278 mg/dL — ABNORMAL HIGH (ref 70–100)
GLUCOSE, POC: 328 mg/dL — ABNORMAL HIGH (ref 70–100)

## 2023-11-18 MED ORDER — FUROSEMIDE 10 MG/ML INJECTION SOLUTION
40.0000 mg | Freq: Once | INTRAMUSCULAR | Status: AC
Start: 2023-11-18 — End: 2023-11-18
  Administered 2023-11-18: 40 mg via INTRAVENOUS
  Filled 2023-11-18: qty 4

## 2023-11-18 MED ORDER — METHYLPREDNISOLONE SOD SUCCINATE 40 MG/ML SOLUTION FOR INJ. WRAPPER
40.0000 mg | Freq: Every day | INTRAMUSCULAR | Status: DC
Start: 2023-11-19 — End: 2023-11-20
  Administered 2023-11-19: 40 mg via INTRAVENOUS
  Filled 2023-11-18: qty 1

## 2023-11-18 MED ORDER — SODIUM ZIRCONIUM CYCLOSILICATE 10 GRAM ORAL POWDER PACKET
10.0000 g | Freq: Every day | ORAL | Status: DC
Start: 2023-11-20 — End: 2023-11-20

## 2023-11-18 MED ORDER — SODIUM ZIRCONIUM CYCLOSILICATE 10 GRAM ORAL POWDER PACKET
10.0000 g | ORAL | Status: AC
Start: 2023-11-18 — End: 2023-11-20
  Administered 2023-11-18 – 2023-11-20 (×6): 10 g via ORAL
  Filled 2023-11-18 (×6): qty 1

## 2023-11-18 NOTE — Respiratory Therapy (Signed)
 BiPAP on stand by-pt having an over night pulse oxi

## 2023-11-18 NOTE — Respiratory Therapy (Signed)
 11/18/23 1717   Non-Invasive Ventilation Assessment   Start Time 1717   $ NIV Check S   $ O2 Delivery NC   Flow (L/min) (Oxygen Therapy) 3   Oxygen Noting/Safety Checks Patient in No Apparent Respiratory Distress   Circuit Checked   H2O Bag Checked   Hepa Filter Checked     PT found to be off NC upon RT arrival with SpO2 of 84% PT returned to NC and SpO2 and respiratory status  improved to within acceptable limits. PT denies SOB or need for BiPAP at this time.

## 2023-11-18 NOTE — Respiratory Therapy (Signed)
 11/18/23 0931   Non-Invasive Ventilation Assessment   Start Time 0931   Orders Updated in EMR Yes   $ NIV Check S   NIV Status Found Off Bipap and On   $ O2 Delivery NC   Flow (L/min) (Oxygen Therapy) 3   Oxygen Noting/Safety Checks Patient in No Apparent Respiratory Distress   Circuit Checked   H2O Bag Checked   Hepa Filter Checked     PT found resting comfortably on NC, PT denies SOB, and respiratory status\vitals remain within acceptable limits at this time. PT given a PRN DUONEB for slight EXW. RT will continue to monitor\titrate, as tolerated.

## 2023-11-18 NOTE — Progress Notes (Signed)
 [place] MEDICINE [facility]    HOSPITALIST PROGRESS NOTE    Jordan Buckley  Date of service: 11/18/2023  Date of Admission:  11/16/2023  Hospital Day:  LOS: 1 day     Subjective:   Patient was admitted on 11/17/2023 after presenting to the ER from [facility] with complaints of cough, congestion and increased shortness a breath.  Family states patient has had recurrent pneumonia and was recently treated with Augmentin, however she had an allergic reaction to Augmentin and was switched to another antibiotic.  Patient has taken antibiotics with no improvement.  Family states patient is not typically on oxygen but has been using oxygen 2-4 L nasal cannula for the past several days.  Family states that patient has never been diagnosed with CHF however patient has edema and slightly elevated BNP at 2:10 a.m.Marland Kitchen  She has history of cardiac surgery and chronic obstructive pulmonary disease.    Patient seen and evaluated by the bedside.  No overnight events.  No nursing concerns at this time    Vital Signs:  Filed Vitals:    11/18/23 0931 11/18/23 0959 11/18/23 1149 11/18/23 1249   BP:   131/67    Pulse:  (!) 109 (!) 112    Resp:   17    Temp:   37.1 C (98.7 F)    SpO2: 94%   96%          General: Patient is alert and oriented to person and place.     HEENT: Pupils are of round shape, equal in size, and reactive to light bilaterally. Oral mucous membranes are moist.  Hard of hearing.     Heart: S1 and S2 are present. No appreciable murmur.     Lungs: Breath sounds are appreciated at all posterior lung fields, no appreciable crackles, wheezes, or rhonchi.     Gastrointestinal: Bowel sounds are appreciated at all 4 quadrants. Abdomen is soft, not appreciably distended, non-tender to palpation at all quadrants.  Edema noted to abdomen.     Extremities: Radial pulses are 3/4 bilaterally, dorsalis pedis pulses are 3/4 bilaterally. Capillary refill is less than 3 seconds at distal digits bilaterally.  Appreciable edema of the lower extremities.     Genitourinary: No appreciable suprapubic tenderness.     Neurologic: Follows commands appropriately. No appreciable facial droop. No appreciable focal weakness of the bilateral upper or lower extremities.     Skin: Grossly intact at observable areas.        Intake & Output:    Intake/Output Summary (Last 24 hours) at 11/18/2023 1622  Last data filed at 11/18/2023 0926  Gross per 24 hour   Intake 120 ml   Output --   Net 120 ml     I/O current shift:  04/09 0700 - 04/09 1859  In: 120 [P.O.:120]  Out: -   Emesis:    BM:    Date of Last Bowel Movement: 11/17/23  Heme:      apixaban (ELIQUIS) tablet, 5 mg, Oral, 2x/day  atorvastatin (LIPITOR) tablet, 20 mg, Oral, NIGHTLY  bisacodyl (DULCOLAX) enteric coated tablet, 5 mg, Oral, Q72H PRN  busPIRone (BUSPAR) tablet, 5 mg, Oral, Daily  Correction/SSIP insulin lispro 100 units/mL injection, 2-9 Units, Subcutaneous, 4x/day AC  dextromethorphan-guaiFENesin (ROBITUSSIN DM) 10-100mg  per 5mL oral liquid, 10 mL, Oral, Q4H PRN  dextrose (GLUTOSE) 40% oral gel, 15 g, Oral, Q15 Min PRN  dextrose 50% (0.5 g/mL) injection - syringe, 12.5 g, Intravenous, Q15 Min PRN  docusate sodium (COLACE) capsule, 100 mg, Oral, Daily  escitalopram (LEXAPRO) tablet, 5 mg, Oral, Daily  ferrous sulfate 324 mg (65 mg elemental IRON) tablet, 324 mg, Oral, Daily  furosemide (LASIX) 10 mg/mL injection, 20 mg, Intravenous, Q12H  glucagon injection 1 mg, 1 mg, IntraMUSCULAR, Once PRN  HYDROcodone-acetaminophen (NORCO) 5-325 mg per tablet, 1 Tablet, Oral, Q6H PRN  ipratropium-albuterol 0.5 mg-3 mg(2.5 mg base)/3 mL Solution for Nebulization, 3 mL, Nebulization, Q4H PRN  levothyroxine (SYNTHROID) tablet, 75 mcg, Oral, QAM  loperamide (IMODIUM) capsule, 2 mg, Oral, Once PRN  magnesium hydroxide (MILK OF MAGNESIA) 400mg  per 5mL oral liquid, 30 mL, Oral, Daily PRN  magnesium oxide (MAG-OX) 400mg  (241.3 mg elemental magnesium) tablet, 400 mg, Oral, 3x/day  [START ON  11/19/2023] methylPREDNISolone sod succ (SOLU-medrol) 40 mg/mL injection, 40 mg, Intravenous, Daily  nystatin (NYSTOP) 100,000 units/g topical powder, , Apply Topically, 2x/day  perflutren lipid microspheres (DEFINITY) 1.3 mL in NS 10 mL (tot vol) injection, 2 mL, Intravenous, Cardiology Once PRN  pramipexole (MIRAPEX) tablet, 0.125 mg, Oral, 3x/day  pyridOXINE Vitamin B6 tablet, 250 mg, Oral, 2x/day  ramelteon (ROZEREM) tablet, 8 mg, Oral, NIGHTLY  sodium zirconium cyclosilicate (LOKELMA) powder, 10 g, Oral, 3 times per day   Followed by  [START ON 11/20/2023] sodium zirconium cyclosilicate (LOKELMA) powder, 10 g, Oral, Daily with Lunch  zinc oxide 56.7 g, clotrimazole (LOTRIMIN) 15 g, hydrocortisone (CORTIZONE-10) 28 g compounded topical cream, , Apply Topically, Q30 Min PRN          Labs:  Recent Results (from the past 48 hours)   CBC WITH DIFF    Collection Time: 11/18/23  5:46 AM   Result Value    WBC 4.3    HGB 11.0    HCT 33.2    PLATELETS 132 (L)      Results for orders placed or performed during the hospital encounter of 11/16/23 (from the past 48 hours)   BASIC METABOLIC PANEL, NON-FASTING    Collection Time: 11/17/23  8:29 AM   Result Value    SODIUM 139    POTASSIUM 5.0    CHLORIDE 100    CO2 TOTAL 21    GLUCOSE 50 (L)    BUN 32 (H)    CREATININE 1.19      Recent Results (from the past 48 hours)   COMPREHENSIVE METABOLIC PANEL, NON-FASTING    Collection Time: 11/18/23  5:47 AM   Result Value    ALKALINE PHOSPHATASE 66    ALT (SGPT) 74 (H)    AST (SGOT) 72 (H)   COMPREHENSIVE METABOLIC PANEL, NON-FASTING    Collection Time: 11/17/23  8:28 AM   Result Value    ALKALINE PHOSPHATASE 79    ALT (SGPT) 70 (H)    AST (SGOT) 68 (H)   COMPREHENSIVE METABOLIC PANEL, NON-FASTING    Collection Time: 11/16/23  8:25 PM   Result Value    ALKALINE PHOSPHATASE 84    ALT (SGPT) 73 (H)    AST (SGOT) 68 (H)      Results for orders placed or performed during the hospital encounter of 11/16/23 (from the past 48 hours)    TROPONIN-I    Collection Time: 11/17/23  8:28 AM   Result Value    TROPONIN I 18 (H)   B-TYPE NATRIURETIC PEPTIDE (BNP),PLASMA    Collection Time: 11/16/23  8:25 PM   Result Value    BNP 210 (H)      No results found for this or any previous  visit (from the past 48 hours).   Results for orders placed or performed during the hospital encounter of 11/16/23 (from the past 8 weeks)   HGA1C (HEMOGLOBIN A1C WITH EST AVG GLUCOSE)    Collection Time: 11/17/23  8:28 AM   Result Value    HEMOGLOBIN A1C 11.2 (H)      Results for orders placed or performed during the hospital encounter of 11/16/23 (from the past 48 hours)   BLOOD GAS W/ CO-OX, LYTES, LACTATE REFLEX Arterial    Collection Time: 11/17/23  9:44 PM   Result Value    LACTATE 0.6        Microbiology:  Hospital Encounter on 11/16/23 (from the past 96 hours)   ADULT ROUTINE BLOOD CULTURE, SET OF 2 ADULT BOTTLES (BACTERIA AND YEAST)    Collection Time: 11/16/23  8:25 PM    Specimen: Blood   Culture Result Status    BLOOD CULTURE, ROUTINE No Growth 18-24 hrs. Preliminary   URINE CULTURE,ROUTINE    Collection Time: 11/16/23 10:59 PM    Specimen: Urine, Clean Catch   Culture Result Status    URINE CULTURE (A) Preliminary     >100,000 CFU/mL Non-Lactose Fermenting Gram Negative Rod    Narrative    SENSITIVITY (MIC) TO FOLLOW     ADULT ROUTINE BLOOD CULTURE, SET OF 2 ADULT BOTTLES (BACTERIA AND YEAST)    Collection Time: 11/16/23 11:02 PM    Specimen: Blood   Culture Result Status    BLOOD CULTURE, ROUTINE No Growth 18-24 hrs. Preliminary       Imaging:   XR AP MOBILE CHEST  Narrative: Jordan Buckley    RADIOLOGIST: Jordan Buckley    XR AP MOBILE CHEST performed on 11/17/2023 9:00 PM    CLINICAL HISTORY: Incresased SOB.      TECHNIQUE: Frontal view of the chest.    COMPARISON:  11/16/2023    FINDINGS:  Sternotomy wires are present.   The heart size is normal.   Low lung volumes. No consolidation in the visualized lungs.   Degenerative changes are identified within the  thoracic spine.    Impression: SHALLOW INSPIRATION LIMITS THE EXAM.  NO ACUTE FINDINGS.    Radiologist location ID: [id number]  TRANSTHORACIC ECHOCARDIOGRAM - ADULT  **See full report in linked PDF document**                                                                                              [facility]                                                                                          [address] [place], [place] [address]  Transthoracic Echocardiographic Report              ______________________________________________________________________________  Name: Jordan Buckley, Jordan Buckley                              MRN: [id number]                Weight: 135 lb  Study Date: 11/17/2023 11:43 AM                         DOB: 1940-10-20             Height: 64 in  Gender: Female                                          Age: 83 yrs                 BSA: 1.7 m2  Accession #: [id number]                              BP: 106/67 mmHg  Patient Location: PRN NON INVASIVE CARD PRN  Ordering Provider: Katrinka Blazing, EDMOND  Tech: Leavy Cella              ______________________________________________________________________________  Procedure:  Transthoracic complete echo with contrast, 2D, spectral and tissue Doppler, color flow Doppler, M-mode.    Quality:  The study images were of technically adequate quality.    Indications: Heart failure    Conclusions:  Normal left ventricular size and systolic function.  No regional wall motion abnormlities.  Left ventricular EF: 65%.  Normal diastolic function.  moderately dilated right ventricle with moderate RV systolic dysfunction. TAPSE equals 1.5 cm.  Elevated RA pressure (15 mmHg).  Moderate pulmonary hypertension (RVSP ).  Mildly dilated biatrial size  Normal aortic root and ascending aorta size.  No pericardial effusion.  . No aortic stenosis.  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  Dilated IVC with <50% inspiratory collapse (estimated RA pressure: 15 mmHg).  There is early flattening of the interventricular septum in both systole and diastole, consistent with right ventricular pressure and volume overload.  Left ventricular diastolic function could not be assessed due to the presence of AFib.    In comparison with the exam from May 2023, there is severe near differential, tricuspid regurgitation as a central jet. Accordingly, there is marked right ventricular dilation and dysfunction present  on today's exam. Patient is in atrial fibrillation on today's exam    Findings  Left Ventricle:   Normal left ventricular size. Left ventricular systolic function is normal. The left ventricular ejection fraction by visual assessment is estimated to be 65%. No segmental/regional  wall motion abnormalities identified. There is flattening of the interventricular septum in both systole and diastole, consistent with right ventricular pressure and volume overload. Left ventricular  diastolic function could not be assessed due to the presence of AFib.  Right Ventricle:   Moderately dilated right ventricle. Severely depressed right ventricular systolic function. RV systolic pressure is consistent with moderate pulmonary hypertension.  Left Atrium:   Mildly dilated left atrium.  Right Atrium:   The right atrium is mildly dilated.  Mitral Valve:   The mitral valve is normal. There is mild mitral regurgitation.  Tricuspid Valve:  There is severe tricuspid regurgitation.  Aortic Valve:   The aortic valve is normal. Trileaflet aortic valve. No Aortic valve stenosis. There is no evidence of aortic regurgitation.  Pulmonic Valve:   The pulmonic valve is normal.  Pulmonary Artery:   The pulmonary artery pressure is estimated at 45 mmHg.  Atrial Septum:   The interatrial septum is normal in appearance.  IVC/Hepatic Veins:   Dilated IVC with <50% inspiratory collapse (estimated RA pressure: 15  mmHg).  Aorta:   The aortic root is of normal size.  Pericardium/Pleural space:   Normal pericardium with no pericardial effusion.    Electronically signed by: M.D. Lerry Liner on 11/17/2023 06:25 PM  US ABDOMEN (FOR ASCITES)  Narrative: Jordan Buckley    EXAM DESCRIPTION: ABDOMINAL ULTRASOUND FOR ASCITES    CLINICAL HISTORY: Ascites; abdominal distention    COMPARISON: 12/21/2022    FINDINGS:     Ultrasound images of the abdomen were obtained. No ascites is noted at this time.  Impression: No ascites is noted on the current study.    Radiologist location ID: [id number]      Assessment/ Plan:   Active Hospital Problems   (*Primary Problem)    Diagnosis    *Fluid overload    COPD (chronic obstructive pulmonary disease)    CVA (cerebrovascular accident)     History      Depression    Metabolic encephalopathy    Alzheimer's disease, unspecified (CODE)    Diabetes mellitus, type 2     Chronic    Coronary artery disease    Hypothyroidism       Nutrition:    DIET DIABETIC Calorie amount: CC 1800; Do you want to initiate MNT Protocol? Yes; Restrict fluids to: 1200 ML    Additional clinical characteristics related to nutrition:    - monitor for weight changes   - monitor intake and output    - monitor bowel functions          Disposition Planning:     Fluid overload  Hypoglycemia  Metabolic encephalopathy  Chronic obstructive pulmonary disease  History of CVA  Dementia  Diabetes type 2  History of CAD  Hypothyroidism  Depression  Hyperkalemia    -echocardiogram pending  -IV Lasix  -D10 at 15 mL/hour  -q.12 hours blood sugars  -urine culture pending  -strict I&O  -home medications as appropriate  -start ramelteon tonight  -aspiration precaution  -avoid benzodiazepines  -started on Lokelma, repeat BMP    DVT px: Eliquis    The Hospitalist personally evaluated and examined the patient in conjunction with the MLP and agree with the assessments, treatment plan and disposition of the patient as recorded by the [facility].    Farrel Gobble, NP-C  11/18/2023  [facility] MEDICINE HOSPITALIST

## 2023-11-18 NOTE — Respiratory Therapy (Signed)
 11/18/23 0315   Non-Invasive Ventilation Assessment   Start Time 0315   NIV Status Found Off Bipap and On   $ O2 Delivery NC   Flow (L/min) (Oxygen Therapy) 3   Non-Invasive Ventilation Check   SpO2 97 %   Timepoint   Stop Time 0320   Check Duration 5 minutes     Pt. Assigned RN stated pt would not put BPAP back on. RN placed pt on 3L NC.

## 2023-11-19 DIAGNOSIS — I5031 Acute diastolic (congestive) heart failure: Secondary | ICD-10-CM

## 2023-11-19 DIAGNOSIS — E1165 Type 2 diabetes mellitus with hyperglycemia: Secondary | ICD-10-CM

## 2023-11-19 DIAGNOSIS — J9602 Acute respiratory failure with hypercapnia: Secondary | ICD-10-CM

## 2023-11-19 DIAGNOSIS — E8770 Fluid overload, unspecified: Secondary | ICD-10-CM

## 2023-11-19 DIAGNOSIS — Z79899 Other long term (current) drug therapy: Secondary | ICD-10-CM

## 2023-11-19 DIAGNOSIS — Z794 Long term (current) use of insulin: Secondary | ICD-10-CM

## 2023-11-19 LAB — CBC WITH DIFF
BASOPHIL #: 0.1 10*3/uL (ref 0.00–0.10)
BASOPHIL %: 1 % (ref 0–1)
EOSINOPHIL #: 0.1 10*3/uL (ref 0.00–0.50)
EOSINOPHIL %: 1 % (ref 1–7)
HCT: 35.7 % (ref 31.2–41.9)
HGB: 11.7 g/dL (ref 10.9–14.3)
LYMPHOCYTE #: 1.6 10*3/uL (ref 1.10–3.10)
LYMPHOCYTE %: 19 % (ref 16–46)
MCH: 31 pg (ref 24.7–32.8)
MCHC: 32.8 g/dL (ref 32.3–35.6)
MCV: 94.4 fL (ref 75.5–95.3)
MONOCYTE #: 0.5 10*3/uL (ref 0.20–0.90)
MONOCYTE %: 6 % (ref 4–11)
MPV: 7.5 fL — ABNORMAL LOW (ref 7.9–10.8)
NEUTROPHIL #: 6.1 10*3/uL (ref 1.90–8.20)
NEUTROPHIL %: 73 % (ref 43–77)
PLATELETS: 139 10*3/uL — ABNORMAL LOW (ref 140–440)
RBC: 3.78 10*6/uL (ref 3.63–4.92)
RDW: 15.3 % (ref 12.3–17.7)
WBC: 8.4 10*3/uL (ref 3.8–11.8)

## 2023-11-19 LAB — POC BLOOD GLUCOSE (RESULTS)
GLUCOSE, POC: 171 mg/dL — ABNORMAL HIGH (ref 70–100)
GLUCOSE, POC: 193 mg/dL — ABNORMAL HIGH (ref 70–100)
GLUCOSE, POC: 287 mg/dL — ABNORMAL HIGH (ref 70–100)
GLUCOSE, POC: 270 mg/dL — ABNORMAL HIGH (ref 70–100)
GLUCOSE, POC: 294 mg/dL — ABNORMAL HIGH (ref 70–100)

## 2023-11-19 LAB — COMPREHENSIVE METABOLIC PANEL, NON-FASTING
ALBUMIN/GLOBULIN RATIO: 1.3 (ref 0.8–1.4)
ALBUMIN: 3.6 g/dL (ref 3.5–5.7)
ALKALINE PHOSPHATASE: 65 U/L (ref 34–104)
ALT (SGPT): 71 U/L — ABNORMAL HIGH (ref 7–52)
ANION GAP: 8 mmol/L (ref 4–13)
AST (SGOT): 75 U/L — ABNORMAL HIGH (ref 13–39)
BILIRUBIN TOTAL: 1 mg/dL (ref 0.3–1.0)
BUN/CREA RATIO: 41 — ABNORMAL HIGH (ref 6–22)
BUN: 51 mg/dL — ABNORMAL HIGH (ref 7–25)
CALCIUM, CORRECTED: 9.2 mg/dL (ref 8.9–10.8)
CALCIUM: 8.9 mg/dL (ref 8.6–10.3)
CHLORIDE: 97 mmol/L — ABNORMAL LOW (ref 98–107)
CO2 TOTAL: 32 mmol/L — ABNORMAL HIGH (ref 21–31)
CREATININE: 1.23 mg/dL (ref 0.60–1.30)
ESTIMATED GFR: 44 mL/min/{1.73_m2} — ABNORMAL LOW (ref >59)
GLOBULIN: 2.8 (ref 2.0–3.5)
GLUCOSE: 179 mg/dL — ABNORMAL HIGH (ref 74–109)
OSMOLALITY, CALCULATED: 292 mosm/kg — ABNORMAL HIGH (ref 270–290)
POTASSIUM: 5.1 mmol/L (ref 3.5–5.1)
PROTEIN TOTAL: 6.4 g/dL (ref 6.4–8.9)
SODIUM: 137 mmol/L (ref 136–145)

## 2023-11-19 LAB — URINE CULTURE,ROUTINE: URINE CULTURE: >100000 — AB

## 2023-11-19 LAB — MAGNESIUM: MAGNESIUM: 2.2 mg/dL (ref 1.9–2.7)

## 2023-11-19 MED ORDER — INSULIN GLARGINE 100 UNITS/ML SUBQ - CHARGE BY DOSE
5.0000 [IU] | Freq: Every day | SUBCUTANEOUS | Status: DC
Start: 2023-11-19 — End: 2023-11-20
  Administered 2023-11-19: 5 [IU] via SUBCUTANEOUS
  Administered 2023-11-20: 0 [IU] via SUBCUTANEOUS
  Filled 2023-11-19: qty 5

## 2023-11-19 MED ORDER — FUROSEMIDE 40 MG TABLET
40.0000 mg | ORAL_TABLET | Freq: Every day | ORAL | Status: DC
Start: 2023-11-19 — End: 2023-11-20
  Administered 2023-11-19: 40 mg via ORAL
  Administered 2023-11-20: 0 mg via ORAL
  Filled 2023-11-19: qty 1

## 2023-11-19 MED ORDER — INSULIN LISPRO 100 UNIT/ML SUB-Q - CHARGE BY DOSE
3.0000 [IU] | Freq: Three times a day (TID) | SUBCUTANEOUS | Status: DC
Start: 2023-11-19 — End: 2023-11-20
  Administered 2023-11-19: 3 [IU] via SUBCUTANEOUS
  Administered 2023-11-20: 0 [IU] via SUBCUTANEOUS
  Filled 2023-11-19: qty 3

## 2023-11-19 MED ORDER — METOPROLOL TARTRATE 25 MG TABLET
12.5000 mg | ORAL_TABLET | Freq: Two times a day (BID) | ORAL | Status: DC
Start: 2023-11-19 — End: 2023-11-23
  Administered 2023-11-19 – 2023-11-23 (×9): 12.5 mg via ORAL
  Filled 2023-11-19 (×9): qty 1

## 2023-11-19 MED ORDER — AMOXICILLIN 875 MG-POTASSIUM CLAVULANATE 125 MG TABLET
1.0000 | ORAL_TABLET | Freq: Two times a day (BID) | ORAL | Status: DC
Start: 2023-11-19 — End: 2023-11-20
  Administered 2023-11-19: 1 via ORAL
  Administered 2023-11-19: 0 via ORAL
  Administered 2023-11-20: 1 via ORAL
  Filled 2023-11-19 (×3): qty 1

## 2023-11-19 NOTE — Care Management Notes (Signed)
 [facility]  Care Management Initial Evaluation    Patient Name: Jordan Buckley  Date of Birth: 03-Aug-1941  Sex: female  Date/Time of Admission: 11/16/2023  7:51 PM  Room/Bed: 314/A  Payor: MEDICARE / Plan: MEDICARE PART A AND B / Product Type: Medicare /   Primary Care Providers:  Hildred Priest, PA-C (General)    Pharmacy Info:   Preferred Pharmacy       [facility] [id number] - [place], [place] - [address] AT [facility] [address]    [address] [place] [address]    Phone: [phone] Fax: [phone]    Hours: Not open 24 hours    [facility] [place], [place] - [address]    [address] [place] [address]    Phone: [phone] Fax: [phone]    Hours: Not open 24 hours          Emergency Contact Info:   Extended Emergency Contact Information  Primary Emergency Contact: Jordan Buckley  Mobile Phone: [phone]  Relation: Daughter  Interpreter needed? No  Secondary Emergency Contact: Jordan Buckley  Home Phone: [phone]  Relation: Daughter  Interpreter needed? No    History:   Jordan Buckley is a 83 y.o., female, admitted 11/16/23   11/19/23 1926   Assessment Details   Assessment Type Admission   Insurance Information/Type   Insurance type Medicare   Employment/Financial   Patient has Prescription Coverage?  Yes        Name of Insurance Coverage for Medications Medicare/Medicaid   Financial/Environmental Concerns none   Living Environment   Lives With facility resident   Living Arrangements **nursing home   Able to Return to Prior Arrangements yes   Home Safety   Home Accessibility no concerns   Custody and Legal Status   Do you have a court appointed guardian/conservator? No   Care Management Plan   Discharge Planning Status initial meeting   Discharge plan discussed with: Patient;MPOA   CM will evaluate for rehabilitation potential yes   Patient choice offered to patient/family Yes   Patient aware of possible cost for  ambulance transport?  No   Discharge Needs Assessment   Outpatient/Agency/Support Group Needs skilled nursing facility   Discharge Facility/Level of Care Needs SNF Return (Medicare certified)(code 3)   Transportation Available ambulance   Discharge Information   Discharge Disposition skilled nursing facility   Referral Information   Admission Type inpatient   Arrived From skilled nursing facility   ADVANCE DIRECTIVES   Does the Patient have an Advance Directive? Yes, Patient Does Have Advance Directive for Healthcare Treatment   Type of Advance Directive Completed Medical Power of Attorney   Name of MPOA or Healthcare Surrogate Jordan Buckley/daughter   Phone Number of MPOA or Healthcare Surrogate #[phone]         Height/Weight: 162.6 cm (5\' 4" ) / 72.7 kg (160 lb 6 oz)     LOS: 2 days   Admitting Diagnosis: Fluid overload [E87.70]    Assessment:   11/19/23    Discharge Plan:  SNF Return (Medicare certified) (code 3)  Pt is alert,pleasant and very hard of hearing. Her daughter is at bedside and confirms that pt is expected to return to [facility] when medically ready. Pt has been a long term care resident for "almost 2 years". 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 Updated medicals have been submitted via [facility] to [facility].    The patient will  continue to be evaluated for developing discharge needs.     Case Manager: Allie Dimmer, SOCIAL WORKER  Phone: [phone]

## 2023-11-19 NOTE — Respiratory Therapy (Signed)
 11/19/23 2200   Non-Invasive Ventilation Check   Type of Mask FFM   NG/OG Placed No   Facial Skin Integrity WNL   Device V-60    Mode S/T   Spontaneous Volume 566 mL   Set Rate 14   Spontaneous Rate 17 Breaths Per Minute   Minute Volume 8.6 LPM   FiO2 Set 30%   IPAP 18 cmH20   EPAP 6 cmH2O   PIP 18 cmH2O   Rise Time % 3   I-Time 0.9 seconds   SpO2 99 %     Placed on bipap

## 2023-11-19 NOTE — Respiratory Therapy (Signed)
 11/19/23 0101   Non-Invasive Ventilation Check   Type of Mask FFM   NG/OG Placed No   Facial Skin Integrity WNL   Device V-60    Mode Spon   Spontaneous Volume 406 mL   Set Rate 12   Spontaneous Rate 24 Breaths Per Minute   Minute Volume 7.5 LPM   FiO2 Set 30%   IPAP 18 cmH20   EPAP 6 cmH2O   PIP 20 cmH2O   Rise Time % 3   I-Time 0.9 seconds   SpO2 93 %     Placed PT on BiPAP-ill continue to monitor PT

## 2023-11-19 NOTE — Progress Notes (Addendum)
 [facility]               IP PROGRESS NOTE      Jordan, Buckley  Date of Admission:  11/16/2023  Date of Birth:  04-May-1941  Date of Service:  11/19/2023    Hospital Day:  LOS: 2 days       Subjective:   Jordan Buckley is a 83 y.o. female who is seen and examined for pneumonia fluid overload, hypoglycemia, metabolic encephalopathy. Patient was having nocturnal desat study oxygen saturation dropped to 70 and on room air.  O2 2 L applied sats remained 80-82 oxygen increased to 3 L. patient placed on BiPAP later this morning.  Renal function back to baseline.      Vital Signs:  Temp (24hrs) Max:37.1 C (98.8 F)      Temperature: 36.4 C (97.5 F)  BP (Non-Invasive): (!) 137/105  MAP (Non-Invasive): 113 mmHG  Heart Rate: 98  Respiratory Rate: 16  SpO2: 96 %    Current Medications:  amoxicillin-clavulanate (AUGMENTIN) 875-125mg  per tablet, 1 Tablet, Oral, 2x/day  apixaban (ELIQUIS) tablet, 5 mg, Oral, 2x/day  atorvastatin (LIPITOR) tablet, 20 mg, Oral, NIGHTLY  bisacodyl (DULCOLAX) enteric coated tablet, 5 mg, Oral, Q72H PRN  busPIRone (BUSPAR) tablet, 5 mg, Oral, Daily  Correction/SSIP insulin lispro 100 units/mL injection, 2-9 Units, Subcutaneous, 4x/day AC  dextromethorphan-guaiFENesin (ROBITUSSIN DM) 10-100mg  per 5mL oral liquid, 10 mL, Oral, Q4H PRN  dextrose (GLUTOSE) 40% oral gel, 15 g, Oral, Q15 Min PRN  dextrose 50% (0.5 g/mL) injection - syringe, 12.5 g, Intravenous, Q15 Min PRN  docusate sodium (COLACE) capsule, 100 mg, Oral, Daily  escitalopram (LEXAPRO) tablet, 5 mg, Oral, Daily  ferrous sulfate 324 mg (65 mg elemental IRON) tablet, 324 mg, Oral, Daily  furosemide (LASIX) tablet, 40 mg, Oral, Daily  glucagon injection 1 mg, 1 mg, IntraMUSCULAR, Once PRN  HYDROcodone-acetaminophen (NORCO) 5-325 mg per tablet, 1 Tablet, Oral, Q6H PRN  insulin glargine 100 units/mL injection, 5 Units, Subcutaneous, Daily  insulin lispro 100 units/mL injection, 3 Units, Subcutaneous, 3x/day AC  ipratropium-albuterol  0.5 mg-3 mg(2.5 mg base)/3 mL Solution for Nebulization, 3 mL, Nebulization, Q4H PRN  levothyroxine (SYNTHROID) tablet, 75 mcg, Oral, QAM  loperamide (IMODIUM) capsule, 2 mg, Oral, Once PRN  magnesium hydroxide (MILK OF MAGNESIA) 400mg  per 5mL oral liquid, 30 mL, Oral, Daily PRN  magnesium oxide (MAG-OX) 400mg  (241.3 mg elemental magnesium) tablet, 400 mg, Oral, 3x/day  methylPREDNISolone sod succ (SOLU-medrol) 40 mg/mL injection, 40 mg, Intravenous, Daily  metoprolol tartrate (LOPRESSOR) tablet, 12.5 mg, Oral, 2x/day  nystatin (NYSTOP) 100,000 units/g topical powder, , Apply Topically, 2x/day  perflutren lipid microspheres (DEFINITY) 1.3 mL in NS 10 mL (tot vol) injection, 2 mL, Intravenous, Cardiology Once PRN  pramipexole (MIRAPEX) tablet, 0.125 mg, Oral, 3x/day  pyridOXINE Vitamin B6 tablet, 250 mg, Oral, 2x/day  ramelteon (ROZEREM) tablet, 8 mg, Oral, NIGHTLY  sodium zirconium cyclosilicate (LOKELMA) powder, 10 g, Oral, 3 times per day   Followed by  [START ON 11/20/2023] sodium zirconium cyclosilicate (LOKELMA) powder, 10 g, Oral, Daily with Lunch  zinc oxide 56.7 g, clotrimazole (LOTRIMIN) 15 g, hydrocortisone (CORTIZONE-10) 28 g compounded topical cream, , Apply Topically, Q30 Min PRN        Current Orders:  Active Orders   Lab    CBC/DIFF     Frequency: 0530 - AM DRAW     Number of Occurrences: 1 Occurrences    COMPREHENSIVE METABOLIC PANEL, NON-FASTING     Frequency: 0530 - AM  DRAW     Number of Occurrences: 1 Occurrences    MAGNESIUM     Frequency: 0530 - AM DRAW     Number of Occurrences: 1 Occurrences   Diet    DIET DIABETIC Calorie amount: CC 1800; Do you want to initiate MNT Protocol? Yes; Restrict fluids to: 1200 ML     Frequency: All Meals     Number of Occurrences: 1 Occurrences   Nursing    ACTIVITY     Frequency: UNTIL DISCONTINUED     Number of Occurrences: Until Specified    APIXABAN NURSING ORDER     Frequency: UNTIL DISCONTINUED     Number of Occurrences: Until Specified     Order Comments:  Nursing Instructions:    1.  Print apixaban (Eliquis) guide using the link in this order.   2.  Nurse to provide apixaban patient guide to all patients and be sure that all new starts have received education by the trained anticoagulation educator.  If additional questions, contact pharmacy.           APPLY SEQUENTIAL COMPRESSION DEVICE     Frequency: ONE TIME     Number of Occurrences: 1 Occurrences    HYPOGLYCEMIA MANAGEMENT - CONSCIOUS PATIENT W/DIET ORDER     Frequency: UNTIL DISCONTINUED     Number of Occurrences: Until Specified    HYPOGLYCEMIA MANAGEMENT - UNCONSCIOUS/ALTERED/NPO PATIENT     Frequency: UNTIL DISCONTINUED     Number of Occurrences: Until Specified    HYPOGLYCEMIA TREATMENT ALGORITHM     Frequency: UNTIL DISCONTINUED     Number of Occurrences: Until Specified    INCENTIVE SPIROMETRY NURSING     Frequency: Q1H WA     Number of Occurrences: 250 Occurrences    INTAKE AND OUTPUT Q4H     Frequency: Q4H     Number of Occurrences: Until Specified    MAINTAIN SEQUENTIAL COMPRESSION DEVICE     Frequency: CONTINUOUS     Number of Occurrences: Until Specified    NOTIFY MD     Frequency: PRN     Number of Occurrences: Until Specified    Notify MD Vital Signs     Frequency: PRN     Number of Occurrences: Until Specified    PT IS INTERMEDIATE RISK FOR VENOUS THROMBOEMBOLISM     Frequency: CONTINUOUS     Number of Occurrences: Until Specified    PULSE OXIMETRY Q4H     Frequency: Q4H     Number of Occurrences: Until Specified    TELEMETRY MONITORING - Continuous     Frequency: CONTINUOUS     Number of Occurrences: Until Specified    VITAL SIGNS  Q4H     Frequency: Q4H     Number of Occurrences: Until Specified    WAS PATIENT ON APIXABAN PRIOR TO ADMISSION?     Frequency: UNTIL DISCONTINUED     Number of Occurrences: Until Specified   Code Status    DO NOT ATTEMPT RESUSCITATION (DNAR/DNI/DNR-CC)     Frequency: CONTINUOUS     Number of Occurrences: Until Specified     Order Comments: Do not use mechanical  ventilation or intubation in the event of respiratory failure or other medication indication. May consider use of less invasive airway support such as BiPAP.    In the event of a pulseless cardiac arrest, do NOT perform ACLS (advanced cardiac life support) to attempt resuscitation. IE - DO NOT perform chest compressions/defibrillation or use mechanical ventilation or intubation in  the setting of pulseless cardiac arrest.       Consult    IP CONSULT TO ENDOCRINE/METABOLIC - TELEMEDICINE     Frequency: ONE TIME     Number of Occurrences: 1 Occurrences   Respiratory Care    ACCUPAP SYSTEM     Frequency: Q6H while awake     Number of Occurrences: Until Specified    INCENTIVE SPIROMETRY - RT INSTRUCT     Frequency: ONE TIME     Number of Occurrences: 1 Occurrences    NONINVASIVE POSITIVE PRESSURE VENTILATION PROTOCOL (RT TO DETERMINE SETTINGS)     Frequency: Q4H     Number of Occurrences: Until Specified    OVERNIGHT PULSE OX - INPATIENT     Frequency: 2200     Number of Occurrences: 1 Occurrences   Point of Care Testing    PERFORM POC WHOLE BLOOD GLUCOSE     Frequency: TID AC & HS     Number of Occurrences: Until Specified   Medications    amoxicillin-clavulanate (AUGMENTIN) 875-125mg  per tablet     Frequency: 2x/day     Dose: 1 Tablet     Route: Oral    apixaban (ELIQUIS) tablet     Frequency: 2x/day     Dose: 5 mg     Route: Oral    atorvastatin (LIPITOR) tablet     Frequency: NIGHTLY     Dose: 20 mg     Route: Oral    bisacodyl (DULCOLAX) enteric coated tablet     Frequency: Q72H PRN     Dose: 5 mg     Route: Oral    busPIRone (BUSPAR) tablet     Frequency: Daily     Dose: 5 mg     Route: Oral    Correction/SSIP insulin lispro 100 units/mL injection     Frequency: 4x/day AC     Dose: 2-9 Units     Route: Subcutaneous    dextromethorphan-guaiFENesin (ROBITUSSIN DM) 10-100mg  per 5mL oral liquid     Frequency: Q4H PRN     Dose: 10 mL     Route: Oral    dextrose (GLUTOSE) 40% oral gel     Frequency: Q15 Min PRN     Dose:  15 g     Route: Oral    dextrose 50% (0.5 g/mL) injection - syringe     Frequency: Q15 Min PRN     Dose: 12.5 g     Route: Intravenous    docusate sodium (COLACE) capsule     Frequency: Daily     Dose: 100 mg     Route: Oral    escitalopram (LEXAPRO) tablet     Frequency: Daily     Dose: 5 mg     Route: Oral    ferrous sulfate 324 mg (65 mg elemental IRON) tablet     Frequency: Daily     Dose: 324 mg     Route: Oral    furosemide (LASIX) tablet     Frequency: Daily     Dose: 40 mg     Route: Oral    glucagon injection 1 mg     Frequency: Once PRN     Dose: 1 mg     Route: IntraMUSCULAR    HYDROcodone-acetaminophen (NORCO) 5-325 mg per tablet     Frequency: Q6H PRN     Dose: 1 Tablet     Route: Oral    insulin glargine 100 units/mL injection     Frequency:  Daily     Dose: 5 Units     Route: Subcutaneous    insulin lispro 100 units/mL injection     Frequency: 3x/day AC     Dose: 3 Units     Route: Subcutaneous    ipratropium-albuterol 0.5 mg-3 mg(2.5 mg base)/3 mL Solution for Nebulization     Frequency: Q4H PRN     Dose: 3 mL     Route: Nebulization    levothyroxine (SYNTHROID) tablet     Frequency: QAM     Dose: 75 mcg     Route: Oral    loperamide (IMODIUM) capsule     Frequency: Once PRN     Dose: 2 mg     Route: Oral    magnesium hydroxide (MILK OF MAGNESIA) 400mg  per 5mL oral liquid     Frequency: Daily PRN     Dose: 30 mL     Route: Oral    magnesium oxide (MAG-OX) 400mg  (241.3 mg elemental magnesium) tablet     Frequency: 3x/day     Dose: 400 mg     Route: Oral    methylPREDNISolone sod succ (SOLU-medrol) 40 mg/mL injection     Frequency: Daily     Dose: 40 mg     Route: Intravenous    metoprolol tartrate (LOPRESSOR) tablet     Frequency: 2x/day     Dose: 12.5 mg     Route: Oral    nystatin (NYSTOP) 100,000 units/g topical powder     Frequency: 2x/day     Route: Apply Topically    perflutren lipid microspheres (DEFINITY) 1.3 mL in NS 10 mL (tot vol) injection     Frequency: Cardiology Once PRN     Dose: 2 mL      Route: Intravenous    pramipexole (MIRAPEX) tablet     Frequency: 3x/day     Dose: 0.125 mg     Route: Oral    pyridOXINE Vitamin B6 tablet     Frequency: 2x/day     Dose: 250 mg     Route: Oral    ramelteon (ROZEREM) tablet     Frequency: NIGHTLY     Dose: 8 mg     Route: Oral    sodium zirconium cyclosilicate (LOKELMA) powder     Linked Order: Followed by     Frequency: CUSTOM FREQUENCY     Dose: 10 g     Route: Oral    sodium zirconium cyclosilicate (LOKELMA) powder     Linked Order: Followed by     Frequency: Daily with Lunch     Dose: 10 g     Route: Oral    zinc oxide 56.7 g, clotrimazole (LOTRIMIN) 15 g, hydrocortisone (CORTIZONE-10) 28 g compounded topical cream     Frequency: Q30 Min PRN     Route: Apply Topically        Review of Systems:  Focused review of system was completed. Refer to the HPI for ROS details.     Today's Physical Exam:  Physical Exam  Vitals reviewed.   HENT:      Head: Normocephalic and atraumatic.      Mouth/Throat:      Mouth: Mucous membranes are moist.      Pharynx: Oropharynx is clear.   Cardiovascular:      Rate and Rhythm: Regular rhythm. Tachycardia present.      Pulses: Normal pulses.      Heart sounds: Normal heart sounds.   Pulmonary:  Effort: Pulmonary effort is normal.      Breath sounds: Normal breath sounds.   Abdominal:      General: Bowel sounds are normal.      Palpations: Abdomen is soft.   Skin:     General: Skin is warm and dry.   Neurological:      Mental Status: She is alert and oriented to person, place, and time.          I/O:  I/O last 24 hours:    Intake/Output Summary (Last 24 hours) at 11/19/2023 1432  Last data filed at 11/19/2023 1153  Gross per 24 hour   Intake 220 ml   Output --   Net 220 ml     I/O current shift:  04/10 0700 - 04/10 1859  In: 220 [P.O.:220]  Out: -     Labs:  Reviewed: I have reviewed all lab results.  Lab Results Today:    Results for orders placed or performed during the hospital encounter of 11/16/23 (from the past 24 hours)    POC BLOOD GLUCOSE (RESULTS)   Result Value Ref Range    GLUCOSE, POC 270 (H) 70 - 100 mg/dl   BASIC METABOLIC PANEL   Result Value Ref Range    SODIUM 135 (L) 136 - 145 mmol/L    POTASSIUM 4.5 3.5 - 5.1 mmol/L    CHLORIDE 96 (L) 98 - 107 mmol/L    CO2 TOTAL 34 (H) 21 - 31 mmol/L    ANION GAP 5 4 - 13 mmol/L    CALCIUM 8.8 8.6 - 10.3 mg/dL    GLUCOSE 213 (H) 74 - 109 mg/dL    BUN 50 (H) 7 - 25 mg/dL    CREATININE 0.86 (H) 0.60 - 1.30 mg/dL    BUN/CREA RATIO 36 (H) 6 - 22    ESTIMATED GFR 37 (L) >59 mL/min/1.10m^2    OSMOLALITY, CALCULATED 292 (H) 270 - 290 mOsm/kg   POC BLOOD GLUCOSE (RESULTS)   Result Value Ref Range    GLUCOSE, POC 255 (H) 70 - 100 mg/dl   COMPREHENSIVE METABOLIC PANEL, NON-FASTING   Result Value Ref Range    SODIUM 137 136 - 145 mmol/L    POTASSIUM 5.1 3.5 - 5.1 mmol/L    CHLORIDE 97 (L) 98 - 107 mmol/L    CO2 TOTAL 32 (H) 21 - 31 mmol/L    ANION GAP 8 4 - 13 mmol/L    BUN 51 (H) 7 - 25 mg/dL    CREATININE 5.78 4.69 - 1.30 mg/dL    BUN/CREA RATIO 41 (H) 6 - 22    ESTIMATED GFR 44 (L) >59 mL/min/1.58m^2    ALBUMIN 3.6 3.5 - 5.7 g/dL    CALCIUM 8.9 8.6 - 62.9 mg/dL    GLUCOSE 528 (H) 74 - 109 mg/dL    ALKALINE PHOSPHATASE 65 34 - 104 U/L    ALT (SGPT) 71 (H) 7 - 52 U/L    AST (SGOT) 75 (H) 13 - 39 U/L    BILIRUBIN TOTAL 1.0 0.3 - 1.0 mg/dL    PROTEIN TOTAL 6.4 6.4 - 8.9 g/dL    ALBUMIN/GLOBULIN RATIO 1.3 0.8 - 1.4    OSMOLALITY, CALCULATED 292 (H) 270 - 290 mOsm/kg    CALCIUM, CORRECTED 9.2 8.9 - 10.8 mg/dL    GLOBULIN 2.8 2.0 - 3.5   MAGNESIUM   Result Value Ref Range    MAGNESIUM 2.2 1.9 - 2.7 mg/dL   CBC WITH DIFF   Result Value  Ref Range    WBC 8.4 3.8 - 11.8 x10^3/uL    RBC 3.78 3.63 - 4.92 x10^6/uL    HGB 11.7 10.9 - 14.3 g/dL    HCT 40.1 02.7 - 25.3 %    MCV 94.4 75.5 - 95.3 fL    MCH 31.0 24.7 - 32.8 pg    MCHC 32.8 32.3 - 35.6 g/dL    RDW 66.4 40.3 - 47.4 %    PLATELETS 139 (L) 140 - 440 x10^3/uL    MPV 7.5 (L) 7.9 - 10.8 fL    NEUTROPHIL % 73 43 - 77 %    LYMPHOCYTE % 19 16 - 46 %     MONOCYTE % 6 4 - 11 %    EOSINOPHIL % 1 1 - 7 %    BASOPHIL % 1 0 - 1 %    NEUTROPHIL # 6.10 1.90 - 8.20 x10^3/uL    LYMPHOCYTE # 1.60 1.10 - 3.10 x10^3/uL    MONOCYTE # 0.50 0.20 - 0.90 x10^3/uL    EOSINOPHIL # 0.10 0.00 - 0.50 x10^3/uL    BASOPHIL # 0.10 0.00 - 0.10 x10^3/uL   POC BLOOD GLUCOSE (RESULTS)   Result Value Ref Range    GLUCOSE, POC 171 (H) 70 - 100 mg/dl   POC BLOOD GLUCOSE (RESULTS)   Result Value Ref Range    GLUCOSE, POC 193 (H) 70 - 100 mg/dl   POC BLOOD GLUCOSE (RESULTS)   Result Value Ref Range    GLUCOSE, POC 287 (H) 70 - 100 mg/dl     Micro Results: No results found for any visits on 11/16/23 (from the past 24 hours).  Images:   XR AP MOBILE CHEST  Result Date: 11/17/2023  Impression SHALLOW INSPIRATION LIMITS THE EXAM.  NO ACUTE FINDINGS. Radiologist location ID: [id number]      XR AP MOBILE CHEST   Final Result      SHALLOW INSPIRATION LIMITS THE EXAM.  NO ACUTE FINDINGS.         Radiologist location ID: [id number]         US ABDOMEN (FOR ASCITES)   Final Result   No ascites is noted on the current study.         Radiologist location ID: [id number]         XR AP MOBILE CHEST   Final Result   NO ACUTE FINDINGS.                  Radiologist location ID: [id number]              Problem List:  Active Hospital Problems   (*Primary Problem)    Diagnosis    *Fluid overload    COPD (chronic obstructive pulmonary disease)    CVA (cerebrovascular accident)     History      Depression    Metabolic encephalopathy    Alzheimer's disease, unspecified (CODE)    Diabetes mellitus, type 2     Chronic    Coronary artery disease    Hypothyroidism       Nutrition:    DIET DIABETIC Calorie amount: CC 1800; Do you want to initiate MNT Protocol? Yes; Restrict fluids to: 1200 ML    Additional clinical characteristics related to nutrition:    - monitor for weight changes   - monitor intake and output    - monitor bowel functions            Assessment/ Plan:   Fluid overload  Hypoglycemia  Metabolic  encephalopathy  Chronic obstructive pulmonary disease  Acute hypercapnic respiratory failure  Acute diastolic heart failure  History of CVA  Dementia  Diabetes type 2  History of CAD  Hypothyroidism  Depression  Hyperkalemia     -echocardiogram EF 65%.  moderate pulmonary pressure (RVSP 15 mmHg).  -DC IV Lasix.  Lasix 40 mg daily.  -q.12 hours blood sugars  -urine culture Proteus mirabilis  -strict I&O  -home medications as appropriate  -continue ramelteon   -aspiration precaution  -avoid benzodiazepines  -continue Lokelma, potassium 5.1  - will need to repeat overnight desat study           Hospitalist personally evaluated and examined the patient in conjunction with the MLP and agree with the assessments, treatment plan and disposition of the patient as recorded by the [facility].     DVT/PE Prophylaxis: Apixaban    Discharge Planning:  Skilled nursing placement    Margit Hanks, APRN  [facility] Medicine Hospitalist

## 2023-11-19 NOTE — Respiratory Therapy (Signed)
 11/19/23 0507   Non-Invasive Ventilation Check   Type of Mask FFM   NG/OG Placed No   Facial Skin Integrity WNL   Device V-60    Mode S/T   Spontaneous Volume 456 mL   Set Rate 14   Spontaneous Rate 19 Breaths Per Minute   Minute Volume 8.3 LPM   FiO2 Set 30%   IPAP 18 cmH20   EPAP 6 cmH2O   PIP 19 cmH2O   Rise Time % 3   I-Time 0.9 seconds   SpO2 97 %

## 2023-11-19 NOTE — Nurses Notes (Signed)
 Patient took herself off BiPAP. Placed back on NC.

## 2023-11-19 NOTE — Respiratory Therapy (Signed)
 otal Time Below: 14 min 31 sec   Longest Duration: 10 min 59 sec   Lowest SpO2: 75 at 11/18/23 11:54:47 PM   Number of Events: 10    Over night pulse oxi study completed  Marchelle Folks RN notified me that the Pts SPO2 dropped to 78% on RA-Had to place back 02

## 2023-11-19 NOTE — Nurses Notes (Signed)
 Patient having nocturnal desat study. O2 sat 78% on RA. Patient c/o shortness of breath. Applied O2 at 2L. Sat remains 80-82%. Increased O2 to 3L. Sat up to 94%. RT notified.

## 2023-11-20 ENCOUNTER — Encounter (HOSPITAL_COMMUNITY): Payer: Self-pay

## 2023-11-20 DIAGNOSIS — Z0189 Encounter for other specified special examinations: Secondary | ICD-10-CM

## 2023-11-20 DIAGNOSIS — I110 Hypertensive heart disease with heart failure: Principal | ICD-10-CM

## 2023-11-20 DIAGNOSIS — Z029 Encounter for administrative examinations, unspecified: Secondary | ICD-10-CM

## 2023-11-20 DIAGNOSIS — I509 Heart failure, unspecified: Secondary | ICD-10-CM

## 2023-11-20 DIAGNOSIS — N390 Urinary tract infection, site not specified: Secondary | ICD-10-CM

## 2023-11-20 DIAGNOSIS — I5020 Unspecified systolic (congestive) heart failure: Secondary | ICD-10-CM

## 2023-11-20 DIAGNOSIS — I4891 Unspecified atrial fibrillation: Secondary | ICD-10-CM

## 2023-11-20 DIAGNOSIS — R8271 Bacteriuria: Secondary | ICD-10-CM

## 2023-11-20 DIAGNOSIS — I272 Pulmonary hypertension, unspecified: Secondary | ICD-10-CM | POA: Diagnosis present

## 2023-11-20 LAB — COMPREHENSIVE METABOLIC PANEL, NON-FASTING
ALBUMIN/GLOBULIN RATIO: 1.4 (ref 0.8–1.4)
ALBUMIN: 3.3 g/dL — ABNORMAL LOW (ref 3.5–5.7)
ALKALINE PHOSPHATASE: 56 U/L (ref 34–104)
ALT (SGPT): 67 U/L — ABNORMAL HIGH (ref 7–52)
ANION GAP: 4 mmol/L (ref 4–13)
AST (SGOT): 62 U/L — ABNORMAL HIGH (ref 13–39)
BILIRUBIN TOTAL: 0.8 mg/dL (ref 0.3–1.0)
BUN/CREA RATIO: 38 — ABNORMAL HIGH (ref 6–22)
BUN: 46 mg/dL — ABNORMAL HIGH (ref 7–25)
CALCIUM, CORRECTED: 9.4 mg/dL (ref 8.9–10.8)
CALCIUM: 8.8 mg/dL (ref 8.6–10.3)
CHLORIDE: 95 mmol/L — ABNORMAL LOW (ref 98–107)
CO2 TOTAL: 43 mmol/L (ref 21–31)
CREATININE: 1.21 mg/dL (ref 0.60–1.30)
ESTIMATED GFR: 44 mL/min/{1.73_m2} — ABNORMAL LOW (ref >59)
GLOBULIN: 2.4 (ref 2.0–3.5)
GLUCOSE: 79 mg/dL (ref 74–109)
OSMOLALITY, CALCULATED: 294 mosm/kg — ABNORMAL HIGH (ref 270–290)
POTASSIUM: 3.4 mmol/L — ABNORMAL LOW (ref 3.5–5.1)
PROTEIN TOTAL: 5.7 g/dL — ABNORMAL LOW (ref 6.4–8.9)
SODIUM: 142 mmol/L (ref 136–145)

## 2023-11-20 LAB — BASIC METABOLIC PANEL
ANION GAP: 10 mmol/L (ref 4–13)
BUN/CREA RATIO: 39 — ABNORMAL HIGH (ref 6–22)
BUN: 45 mg/dL — ABNORMAL HIGH (ref 7–25)
CALCIUM: 8.3 mg/dL — ABNORMAL LOW (ref 8.6–10.3)
CHLORIDE: 92 mmol/L — ABNORMAL LOW (ref 98–107)
CO2 TOTAL: 33 mmol/L — ABNORMAL HIGH (ref 21–31)
CREATININE: 1.14 mg/dL (ref 0.60–1.30)
ESTIMATED GFR: 48 mL/min/{1.73_m2} — ABNORMAL LOW (ref >59)
GLUCOSE: 376 mg/dL — ABNORMAL HIGH (ref 74–109)
OSMOLALITY, CALCULATED: 297 mosm/kg — ABNORMAL HIGH (ref 270–290)
POTASSIUM: 5.5 mmol/L — ABNORMAL HIGH (ref 3.5–5.1)
SODIUM: 135 mmol/L — ABNORMAL LOW (ref 136–145)

## 2023-11-20 LAB — CBC WITH DIFF
BASOPHIL #: 0 10*3/uL (ref 0.00–0.10)
BASOPHIL %: 0 % (ref 0–1)
EOSINOPHIL #: 0 10*3/uL (ref 0.00–0.50)
EOSINOPHIL %: 0 % — ABNORMAL LOW (ref 1–7)
HCT: 34.6 % (ref 31.2–41.9)
HGB: 11.3 g/dL (ref 10.9–14.3)
LYMPHOCYTE #: 0.8 10*3/uL — ABNORMAL LOW (ref 1.10–3.10)
LYMPHOCYTE %: 14 % — ABNORMAL LOW (ref 16–46)
MCH: 31 pg (ref 24.7–32.8)
MCHC: 32.8 g/dL (ref 32.3–35.6)
MCV: 94.4 fL (ref 75.5–95.3)
MONOCYTE #: 0.4 10*3/uL (ref 0.20–0.90)
MONOCYTE %: 7 % (ref 4–11)
MPV: 6.9 fL — ABNORMAL LOW (ref 7.9–10.8)
NEUTROPHIL #: 4.6 10*3/uL (ref 1.90–8.20)
NEUTROPHIL %: 78 % — ABNORMAL HIGH (ref 43–77)
PLATELETS: 134 10*3/uL — ABNORMAL LOW (ref 140–440)
RBC: 3.66 10*6/uL (ref 3.63–4.92)
RDW: 15.1 % (ref 12.3–17.7)
WBC: 5.9 10*3/uL (ref 3.8–11.8)

## 2023-11-20 LAB — POC BLOOD GLUCOSE (RESULTS)
GLUCOSE, POC: 57 mg/dL — ABNORMAL LOW (ref 70–100)
GLUCOSE, POC: 90 mg/dL (ref 70–100)
GLUCOSE, POC: 139 mg/dL — ABNORMAL HIGH (ref 70–100)
GLUCOSE, POC: 245 mg/dL — ABNORMAL HIGH (ref 70–100)
GLUCOSE, POC: 358 mg/dL — ABNORMAL HIGH (ref 70–100)
GLUCOSE, POC: 202 mg/dL — ABNORMAL HIGH (ref 70–100)

## 2023-11-20 LAB — MAGNESIUM: MAGNESIUM: 2.3 mg/dL (ref 1.9–2.7)

## 2023-11-20 MED ORDER — SODIUM CHLORIDE 0.9 % INTRAVENOUS PIGGYBACK
1000.0000 mg | Freq: Every evening | INTRAVENOUS | Status: DC
Start: 2023-11-21 — End: 2023-11-20

## 2023-11-20 MED ORDER — SODIUM CHLORIDE 0.9 % INTRAVENOUS PIGGYBACK
1000.0000 mg | INTRAVENOUS | Status: DC
Start: 2023-11-20 — End: 2023-11-20
  Administered 2023-11-20: 0 mg via INTRAVENOUS
  Filled 2023-11-20: qty 10

## 2023-11-20 MED ORDER — INSULIN LISPRO 100 UNIT/ML SUB-Q SSIP VIAL
2.0000 [IU] | INJECTION | Freq: Three times a day (TID) | SUBCUTANEOUS | Status: DC
Start: 2023-11-20 — End: 2023-11-22
  Administered 2023-11-20: 3 [IU] via SUBCUTANEOUS
  Administered 2023-11-20: 9 [IU] via SUBCUTANEOUS
  Administered 2023-11-21: 2 [IU] via SUBCUTANEOUS
  Administered 2023-11-21: 0 [IU] via SUBCUTANEOUS
  Administered 2023-11-21: 2 [IU] via SUBCUTANEOUS
  Administered 2023-11-22: 7 [IU] via SUBCUTANEOUS
  Administered 2023-11-22: 9 [IU] via SUBCUTANEOUS
  Administered 2023-11-22: 7 [IU] via SUBCUTANEOUS
  Filled 2023-11-20: qty 9
  Filled 2023-11-20 (×2): qty 2

## 2023-11-20 MED ORDER — INSULIN LISPRO 100 UNIT/ML SUB-Q - CHARGE BY DOSE
3.0000 [IU] | Freq: Three times a day (TID) | SUBCUTANEOUS | Status: DC
Start: 2023-11-20 — End: 2023-11-22
  Administered 2023-11-20: 0 [IU] via SUBCUTANEOUS
  Administered 2023-11-20 – 2023-11-22 (×5): 3 [IU] via SUBCUTANEOUS
  Filled 2023-11-20 (×4): qty 3

## 2023-11-20 MED ORDER — INSULIN GLARGINE 100 UNITS/ML SUBQ - CHARGE BY DOSE
5.0000 [IU] | Freq: Every day | SUBCUTANEOUS | Status: DC
Start: 2023-11-20 — End: 2023-11-22
  Administered 2023-11-20: 0 [IU] via SUBCUTANEOUS
  Administered 2023-11-21: 5 [IU] via SUBCUTANEOUS
  Filled 2023-11-20 (×3): qty 5

## 2023-11-20 MED ORDER — PREDNISONE 20 MG TABLET
40.0000 mg | ORAL_TABLET | Freq: Every morning | ORAL | Status: DC
Start: 2023-11-20 — End: 2023-11-21
  Administered 2023-11-20 – 2023-11-21 (×2): 40 mg via ORAL
  Filled 2023-11-20 (×2): qty 2

## 2023-11-20 MED ORDER — FUROSEMIDE 20 MG TABLET
20.0000 mg | ORAL_TABLET | Freq: Every day | ORAL | Status: DC
Start: 2023-11-21 — End: 2023-11-23

## 2023-11-20 MED ORDER — SODIUM CHLORIDE 0.9 % IV BOLUS
250.0000 mL | INJECTION | Freq: Once | Status: DC
Start: 2023-11-20 — End: 2023-11-21
  Administered 2023-11-20: 0 mL via INTRAVENOUS

## 2023-11-20 MED ORDER — FOSFOMYCIN TROMETHAMINE 3 GRAM ORAL PACKET
3.0000 g | PACK | Freq: Once | ORAL | Status: AC
Start: 2023-11-20 — End: 2023-11-20
  Administered 2023-11-20: 3 g via ORAL
  Filled 2023-11-20: qty 1

## 2023-11-20 MED ORDER — SODIUM CHLORIDE 0.9 % INTRAVENOUS PIGGYBACK
1000.0000 mg | Freq: Once | INTRAVENOUS | Status: AC
Start: 2023-11-20 — End: 2023-11-20
  Administered 2023-11-20: 1000 mg via INTRAVENOUS
  Administered 2023-11-20: 0 mg via INTRAVENOUS

## 2023-11-20 NOTE — Consults (Addendum)
 COpAT/ID eConsult Initial Note    Reason for Consult:  Urinary tract infection    Pertinent Clinical History:     40 F admitted to [facility] 4/7 from local nursing home due to cough, congestion, and SOB for 2-3 weeks.  Received Augmentin about 3 weeks ago for pneumonia.      Antimicrobials:    IV ertapenem 1 g Q 24 hours  Status post 4/10 and 4/11 Augmentin    PMHx:     COPD/asthma with nightly oxygen  CAD status post CABG  Uncontrolled DM2 A1c 11.2  Osteoporosis   hypertension   CVA  Alzheimer's dementia   Hypothyroidism  Left kidney cancers tumor removed    Antimicrobial Allergies:    Cefaclor-rash   10/2023-report of bilateral lower extremity rash after Augmentin  Macrobid-rash   Quinidine-quinine analogs -rash  Sulfa-rash  Ciprofloxacin- unspecified   Fluconazole-unspecified    Labs, Micro, and Imaging Reviewed:    WBC 5.9. PLT 134 PCL 0.03.  CRP less than 0.5.  CrCl 33.  Bicarb 43.  BUN 46.  AST/ALT 62/67.  Albumin 3.3.  UA-microscopic 75 leukocytes.  Nitrite negative.  Glucose 1000.  Urine culture greater than 100,000 Proteus mirabilis only resistant to tetracycline and Macrobid.  Blood culture-no growth 3 days  Viral 4 plex negative   Most recent QTC-400 ms  CXR-no acute findings but limited by shallow inspiration  Ultrasound abdomen for ascites-no ascites.  TTE 4/8-moderate RV systolic dysfunction.  Moderate pulmonary hypertension.  Normal LVEF.  Severe tricuspid regurgitation.  Right ventricular pressure and volume overload.    Assessment:  24 F admitted to [facility] 4/7 from local nursing home due to cough, congestion, and SOB for 2-3 weeks, clinically fluid overloaded. Found to have atrial fibrillation RVR  and TTE concerning for HFpEF. Heart failure with fluid overload highest likelihood for reason of hypoxia and patient has undergone diuresis. Possibly with asymptomatic bacteriuria, since no obvious localizing symptoms of UTI or pyelo, hypotension, fever, or elevated  infectious/inflammatory labs. But could have cystitis with proteus mirabilis.     Recommendations:     -Stop ertapenem  - give one time dose fosfomycin 3 g now  -continue to avoid Foley catheter if able    Thank you for this consult.  We will sign off at this time.  Re-engage anytime.    I spent 5 minutes or more reviewing the patient's medical record, lab/micro/imaging studies, and/or medications.  Communicated with provider through Secure Chat/In [employer].  Total time >31 min.    Posey Broach, MD     Posey Broach, MD  Assistant Professor, Infectious Diseases  Department of Medicine, [place]  [place]

## 2023-11-20 NOTE — Respiratory Therapy (Signed)
 11/20/23 2100   Non-Invasive Ventilation Assessment   Start Time 2127   Orders Updated in EMR Yes   $ NIV Check S   NIV Status Placed On Bipap   $ O2 Delivery BP   Oxygen Concentration (%) 30    Flow (L/Min) (NICU-PICU) 3 LPM   Oxygen Noting/Safety Checks Oxygen Apparatus Checked and Functioning Properly   Circuit Checked   Proximal  Temperature   (off for pt comfort)   H2O Bag Checked   Hepa Filter Checked   Non-Invasive Ventilation Check   Type of Mask FFM   NG/OG Placed No   Facial Skin Integrity WNL   Device V-60    Mode S/T   Spontaneous Volume 492 mL   Set Rate 12   Spontaneous Rate 6 Breaths Per Minute   Minute Volume 9.1 LPM   FiO2 Set 30%   IPAP 18 cmH20   EPAP 6 cmH2O   PIP 18 cmH2O   Rise Time % 3   I-Time 0.9 seconds   SpO2 99 %   Alarms   Audible Alarms Checked & Functioning   Hi Rate 40   Lo Rate 5   Hi Pressure 40 cmH2O   Lo Pressure 5 cmH2O   Low Volume 100 m L   Leak 2 mL   Apnea Alarm 20 Seconds   Low Min Ventilation 2   Timepoint   Stop Time 2140     Placed patient on BiPAP for sleep.

## 2023-11-20 NOTE — Respiratory Therapy (Signed)
 11/20/23 1045   Non-Invasive Ventilation Assessment   Start Time 1032   $ NIV Check S   NIV Status Placed On Bipap   $ O2 Delivery BP   Oxygen Concentration (%) 30   Oxygen Noting/Safety Checks Bag/Mask Unit at Bedside Per Dept. Policy;Oxygen Flowmeter Checked and Functioning Properly   Circuit Checked   Proximal  Temperature   (Pt refused heater)   H2O Bag Checked   Hepa Filter Checked   Non-Invasive Ventilation Check   Type of Mask FFM   NG/OG Placed No   Facial Skin Integrity WNL   Device V-60    Mode S/T   Spontaneous Volume 614 mL   Set Rate 12   Spontaneous Rate 4 Breaths Per Minute   Minute Volume 9.2 LPM   FiO2 Set 30%   IPAP 18 cmH20   EPAP 6 cmH2O   PIP 17 cmH2O   Rise Time % 3   I-Time 0.9 seconds   SpO2 93 %   Alarms   Audible Alarms Checked & Functioning     Pt placed on BIPAP due to pt complaining of SOB. Settings are as above. Care ongoing.

## 2023-11-20 NOTE — Respiratory Therapy (Signed)
 Pt found off of BIPAP. Redness noted to bridge of nose. Gel protector in room. Care ongoing.

## 2023-11-20 NOTE — Consults (Signed)
 [facility]  Palliative Care Nurse Practitioner  Consult Note    Jordan Buckley, 83 y.o. female  Date of Birth:  Mar 01, 1941  MRN: [id number]  Admit Date: 11/16/2023   Attending: Hospitalist  Jordan Buckley, [place]   Reason for Consult: goal clarification, communication with patient/family, other symptom management   Requesting provider: Margit Hanks, APRN    Chief Complaint:  Shortness of breath    HPI:  Jordan Buckley is a 83 y.o. female who presented to the ED with increased shortness of breath.  She has had recurrent pneumonia and was treated with Augmentin, thought there was an allergic reaction and was switched to another antibiotic but she has not had any improvement.  BNP 210. Pt admitted with fluid overload/acute diastolic heart failure, metabolic encephalopathy, COPD, dementia, UTI.  Pt tx with lasix, antibiotics, steroids, robitussin dm, Norco prn pain, neublizers, prn, oxygen.      Subjective:  Pt lying in bed.  She is oriented to person, place, and time but is a poor historian. She is very hard of hearing.  She stated her daughter has went home for a little while and asked that I give her a call.  She denies shortness of breath at this time.    Review of Systems:  ROS: Other than ROS in the HPI, all other systems were negative.    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    Osteoporosis     Thyroid disease     Wears glasses          Past Surgical History:   Procedure Laterality Date    FIXATION KYPHOPLASTY Right 11/27/2021    HX BACK SURGERY      HX CATARACT REMOVAL      HX CESAREAN SECTION      HX CHOLECYSTECTOMY      HX CORONARY ARTERY BYPASS GRAFT      HX HYSTERECTOMY       KIDNEY SURGERY Left     cancerous tumor removed    LIVER RESECTION      WRIST SURGERY Left           Family Medical History:       Problem Relation (Age of Onset)    No Known Problems Mother, Father            Social History     Socioeconomic History    Marital status: Widowed   Tobacco Use    Smoking status: Never    Smokeless tobacco: Never   Vaping Use    Vaping status: Never Used   Substance and Sexual Activity    Alcohol use: Never    Drug use: Never    Sexual activity: Not Currently     Social Determinants of Health     Social Connections: Medium Risk (11/17/2023)    Social Connections     SDOH Social Isolation: 3 to 5 times a week       Current Outpatient Medications   Medication Instructions    acetaminophen (TYLENOL) 650  mg, EVERY 4 HOURS PRN    apixaban (ELIQUIS) 5 mg, 2 TIMES DAILY    atorvastatin (LIPITOR) 20 mg, NIGHTLY    baclofen (LIORESAL) 5 mg, 3 TIMES DAILY PRN    bisacodyL (DULCOLAX) 15 mg, EVERY 72 HOURS PRN    budesonide-formoteroL (SYMBICORT) 80-4.5 mcg/actuation Inhalation oral inhaler 1 Puff, Daily    busPIRone (BUSPAR) 5 mg Oral Tablet Take 1 Tablet (5 mg total) by mouth Twice daily    calcium carbonate-vitamin D3 (CALCIUM 600 + D) 600 mg-10 mcg (400 unit) Oral Tablet 1 Tablet, 2 TIMES DAILY    Cranberry 450 mg, Oral, Daily    dextromethorphan-guaiFENesin (ROBAFEN DM COUGH) 10-100 mg/5 mL Oral Liquid 10 mL, EVERY 4 HOURS PRN    docusate sodium (COLACE) 100 mg, Oral, Daily    ergocalciferol, vitamin D2, (DRISDOL) 1,250 mcg (50,000 unit) Oral Capsule Take 1 Capsule (50,000 Units total) by mouth On the first of the month    escitalopram oxalate (LEXAPRO) 5 mg Oral Tablet Take 1 Tablet (5 mg total) by mouth Daily    ferrous sulfate (FEOSOL) 325 mg, Oral, Daily    gabapentin (NEURONTIN) 100 mg, NIGHTLY    HYDROcodone-acetaminophen (NORCO) 5-325 mg Oral Tablet 1 Tablet, 3 TIMES DAILY PRN    hydrocortisone acetate (ANUSOL-HC) 25 mg, 4 TIMES DAILY PRN    insulin glargine 18 Units, EVERY MORNING     insulin glargine 8 Units, NIGHTLY    insulin lispro 100 unit/mL Subcutaneous Solution 3 TIMES DAILY WITH MEALS    ipratropium-albuterol 0.5 mg-3 mg(2.5 mg base)/3 mL Solution for Nebulization 3 mL, 3 TIMES DAILY    JANUVIA 100 mg Oral Tablet Take 1 Tablet (100 mg total) by mouth Daily    levothyroxine (SYNTHROID) 75 mcg, EVERY MORNING    loperamide (IMODIUM) 2 mg, ONCE PRN    loratadine (CLARITIN) 10 mg, Daily    mag hydrox/aluminum hyd/simeth (RULOX ORAL) 20 mL, EVERY 4 HOURS PRN    magnesium hydroxide (MILK OF MAGNESIA) 400 mg/5 mL Oral Suspension 30 mL, DAILY PRN    magnesium oxide (MAG-OX) 400 mg, 3 TIMES DAILY    mineral oil (FLEET) Rectal Enema 133 mL, EVERY 72 HOURS PRN    Olive Oil (SWEET OIL) Oil 1 Drop, NIGHTLY    omeprazole (PRILOSEC) 20 mg, Daily    ondansetron (ZOFRAN ODT) 4 mg, Oral, EVERY 8 HOURS PRN    phenol 1.4% (CHLORASEPTIC) 1.4 % Mucous Membrane Aerosol, Spray 2 Sprays, ONCE PRN    phenyleph-min oil-petrolatum (PREPARATION H) 0.25-14-74.9 % Rectal Ointment ONCE PRN    polyethylene glycol 400 (VISINE DRY EYE RELIEF) 1 % Ophthalmic Drops 1 Drop, 2 TIMES DAILY    pramipexole (MIRAPEX) 1 mg, NIGHTLY    pramipexole (MIRAPEX) 0.125 mg, 3 TIMES DAILY    Pyridoxine (VITAMIN B6) 250 mg, 2 TIMES DAILY    sennosides-docusate sodium (SENOKOT-S) 8.6-50 mg Oral Tablet 1 Tablet, NIGHTLY    simethicone (MYLICON) 80 mg, 3 TIMES DAILY      Allergies   Allergen Reactions    Cefaclor Rash    Nitrofurantoin Rash    Quinine Rash    Sulfa (Sulfonamides) Rash    Ciprofloxacin     Dulaglutide     Fluconazole     Nitrofurantoin Macrocrystal     Quinidine-Quinine Analogues ([place] Alkaloids)         Physical Exam:  Constitutional: no distress  Respiratory: Clear to auscultation bilaterally.   Cardiovascular: S1, S2 normal  Gastrointestinal: non-distended, Soft, non-tender, Bowel sounds normal  Extremities: No edema  Integumentary:  Skin warm and dry  Neurologic:  alert, oriented x 3 with questioning, poor  historian    BP 104/68   Pulse (!) 112   Temp 36.4 C (97.6 F)   Resp 18   Ht 1.626 m (5\' 4" )   Wt 68.5 kg (151 lb 0.8 oz)   SpO2 99%   BMI 25.93 kg/m      Pain: Numeric 0     Labs:  Lab Results Today:    Results for orders placed or performed during the hospital encounter of 11/16/23 (from the past 24 hours)   POC BLOOD GLUCOSE (RESULTS)   Result Value Ref Range    GLUCOSE, POC 270 (H) 70 - 100 mg/dl   POC BLOOD GLUCOSE (RESULTS)   Result Value Ref Range    GLUCOSE, POC 294 (H) 70 - 100 mg/dl   POC BLOOD GLUCOSE (RESULTS)   Result Value Ref Range    GLUCOSE, POC 57 (L) 70 - 100 mg/dl   POC BLOOD GLUCOSE (RESULTS)   Result Value Ref Range    GLUCOSE, POC 90 70 - 100 mg/dl   COMPREHENSIVE METABOLIC PANEL, NON-FASTING   Result Value Ref Range    SODIUM 142 136 - 145 mmol/L    POTASSIUM 3.4 (L) 3.5 - 5.1 mmol/L    CHLORIDE 95 (L) 98 - 107 mmol/L    CO2 TOTAL 43 (HH) 21 - 31 mmol/L    ANION GAP 4 4 - 13 mmol/L    BUN 46 (H) 7 - 25 mg/dL    CREATININE 1.61 0.96 - 1.30 mg/dL    BUN/CREA RATIO 38 (H) 6 - 22    ESTIMATED GFR 44 (L) >59 mL/min/1.55m^2    ALBUMIN 3.3 (L) 3.5 - 5.7 g/dL    CALCIUM 8.8 8.6 - 04.5 mg/dL    GLUCOSE 79 74 - 409 mg/dL    ALKALINE PHOSPHATASE 56 34 - 104 U/L    ALT (SGPT) 67 (H) 7 - 52 U/L    AST (SGOT) 62 (H) 13 - 39 U/L    BILIRUBIN TOTAL 0.8 0.3 - 1.0 mg/dL    PROTEIN TOTAL 5.7 (L) 6.4 - 8.9 g/dL    ALBUMIN/GLOBULIN RATIO 1.4 0.8 - 1.4    OSMOLALITY, CALCULATED 294 (H) 270 - 290 mOsm/kg    CALCIUM, CORRECTED 9.4 8.9 - 10.8 mg/dL    GLOBULIN 2.4 2.0 - 3.5   MAGNESIUM   Result Value Ref Range    MAGNESIUM 2.3 1.9 - 2.7 mg/dL   CBC WITH DIFF   Result Value Ref Range    WBC 5.9 3.8 - 11.8 x10^3/uL    RBC 3.66 3.63 - 4.92 x10^6/uL    HGB 11.3 10.9 - 14.3 g/dL    HCT 81.1 91.4 - 78.2 %    MCV 94.4 75.5 - 95.3 fL    MCH 31.0 24.7 - 32.8 pg    MCHC 32.8 32.3 - 35.6 g/dL    RDW 95.6 21.3 - 08.6 %    PLATELETS 134 (L) 140 - 440 x10^3/uL    MPV 6.9 (L) 7.9 - 10.8 fL    NEUTROPHIL % 78 (H) 43 - 77 %     LYMPHOCYTE % 14 (L) 16 - 46 %    MONOCYTE % 7 4 - 11 %    EOSINOPHIL % 0 (L) 1 - 7 %    BASOPHIL % 0 0 - 1 %    NEUTROPHIL # 4.60 1.90 - 8.20 x10^3/uL  LYMPHOCYTE # 0.80 (L) 1.10 - 3.10 x10^3/uL    MONOCYTE # 0.40 0.20 - 0.90 x10^3/uL    EOSINOPHIL # 0.00 0.00 - 0.50 x10^3/uL    BASOPHIL # 0.00 0.00 - 0.10 x10^3/uL   POC BLOOD GLUCOSE (RESULTS)   Result Value Ref Range    GLUCOSE, POC 139 (H) 70 - 100 mg/dl   POC BLOOD GLUCOSE (RESULTS)   Result Value Ref Range    GLUCOSE, POC 245 (H) 70 - 100 mg/dl   POC BLOOD GLUCOSE (RESULTS)   Result Value Ref Range    GLUCOSE, POC 358 (H) 70 - 100 mg/dl       Imaging Studies:    Images and Reports reviewed to current date.    Care collaborated with the following providers:  Dr. Hilton Lucky    ACP:  MPOA Jonny Neu and Murl Armor.  Copy is in EPIC.    Code Status:  DNAR/DNI.  Daugther Jonny Neu confirmed code status.  Copy of DNR card is in EPIC.    GOC:  Patient resides at [facility].  Pt is alert, oriented to questioning but is a poor historian.  She is also very hard of hearing which also makes her answering questions correctly very difficult.  Pt asked that I call her daughter Mylinda Asa and talk with her.  I spoke to [employer] via telephone.  We reviewed her mom's current condition and plan of care.  Ms. Sharrie Deed states her mom has had recurrent pneumonia, been on several antibiotics, has COPD, dementia, and a new diagnosis of CHF.  She states at the nursing home her mom is able to walk with a walker and sometimes uses a wheelchair.  She states her mom has a good appetite and has not lost any weight but had gained 11#.  She is unsure when the weight gain was to know if it was a weight gain from fluid or because she was eating good.  She states her mom usually knows who her family is but when she gets an infection her confusion gets worse.  She reports that her mom has a weak bladder and wears depends at the nursing home but is not usually incontinent unless she is sick or  has a UTI.  She states her mom is also a very brittle diabetic even when she eats like she should and take her medications.  Education on the disease trajectories of chronic health conditions and impact on overall health discussed, the daughter voiced understanding and had no questions.  Palliative care and the services they provide was discussed with the daughter.  She had some questions about if she would have to tell her mom that it was hospice services because the word hospice scares her mom and she doesn't want her to know that.  I explained to her that palliative care isn't the same thing as hospice but also asked if hospice was something that she would be interested in instead.  Ms. Sharrie Deed stated that she did not want hospice but thought she wanted palliative as long as she didn't have to call it hospice.  Ms. Sharrie Deed also asked if palliative was able to go to the nursing home on a daily basis and sit with her mom and take care of her and I explained to her that palliative was not able to provide that service and explained it was a NP that provided visits and frequency of visits.  Ms. Sharrie Deed stated she doesn't think she will want palliative care as  she thought it was something different.  She stated she would talk with her sister and if they decide they want services, she will call and let me know.  Ms. Sharrie Deed stated that she did have a concern that last night her mom got hot, nauseated, had a "thick tongue" , and was more confused than normal and stated that when she was with her mom today that it also looked like her mom's tongue was swollen/thick and asked if dementia caused a swollen tongue to which I told her that dementia itself did not cause that.  Ms. Sharrie Deed stated that her mom was having similar symptoms along with a rash on her legs when she was taking Augmentin recently and she brought her to the ED but that the ED provider did not feel it was an allergic reaction but did change the antibiotics to  something else but did not want to list the Augmentin as an allergy because she was already limited on the medications she could have because of allergies.  I looked at the pt's medication list and she was started on Augmentin last night.  I notified the daughter of this and she stated that would make sense if her mom was allergic to that and she started having symptoms after taking a dose last night and still had the thick/swollen tongue today.  I told the daughter I would discuss her concerns about the allergy to Augmentin with the hospitalist.  I talked with Dr. Hilton Lucky and updated him on the daughter's concerns for allergy to Augmentin and why she thought it was an allergy.  He discontinued the Augmentin and is going to order a different antibiotic.    PPS prior to hospitalization: 50%    PPS currently: 50%      Persons present and participating in discussion: Patient, Jonny Neu      Was the conversation voluntary? Yes      Patient Disposition/Discharge needs:  Nursing Home    ACP/GOC time:     30 minutes    Total visit time:     65 minutes including ACP/GOC time, Face to face, reviewing medical records, and documentation time.    Thank you for this consult.    Elisabeth Guild, APRN,NP-C  Palliative Care Nurse Practitioner    This note may have been partially generated using [employer] Direct system and there may be some incorrect words, spellings, and punctuation that were not noted in checking the note before saving.

## 2023-11-20 NOTE — Telemedicine Consult (Signed)
 Endocrinology Initial Telemedicine Consult     Telemedicine Documentation:  Patient Location: [facility]  Patient/family aware of provider location: yes  Patient/family consent for telemedicine: yes  Examination observed and performed by: Merry Abo, MD  Provider's Physical Location: [facility]    Today's Date:       11/20/2023  Encounter Start Date:      11/16/2023  Inpatient Admission Date:  11/17/2023  Name:             Jordan Buckley  Age:             83 y.o.  Attending:             Rhetta Cellar, MD  PCP             Edna Gouty, PA-C    Reason for Consult:  Diabetes management  Chief Complaint:  Fluid overload    HISTORY OF PRESENTING ILLNESS:     Jordan Buckley is a 83 y.o. female with PMH of type 2 diabetes, chronic obstructive pulmonary disease, CAD, CVA, dementia, hypertension, has been admitted to [facility] for for fluid overload and pneumonia. Endocrinology is consulted for management of diabetes.    Patient resides in a nursing home.  She states she gets NovoLog 2 to 3 times a day and some pills for diabetes, however, she was unable to confirm her doses.  Per chart review, she is on Lantus and lispro but doses have not been mentioned.  Patient is a poor historian.  She denies any nausea or vomiting and reports that her appetite is good.  Patient is currently getting only sliding scale lispro.     MEDICAL HISTORY:   PAST MEDICAL & SURGICAL HISTORIES:   Past Medical History:   Diagnosis Date    Alzheimer's dementia     Arthropathy     Asthma     Cancer (CMS HCC)     COPD (chronic obstructive pulmonary disease)     Coronary artery disease     CVA (cerebrovascular accident)     Depression     Diabetes mellitus, type 2     Hard of hearing     HTN (hypertension)     Osteoporosis     Thyroid disease     Wears glasses         Past Surgical History:   Procedure Laterality Date    FIXATION KYPHOPLASTY Right 11/27/2021    HX BACK SURGERY      HX CATARACT REMOVAL       HX CESAREAN SECTION      HX CHOLECYSTECTOMY      HX CORONARY ARTERY BYPASS GRAFT      HX HYSTERECTOMY      KIDNEY SURGERY Left     cancerous tumor removed    LIVER RESECTION      WRIST SURGERY Left             HOME MEDICATIONS:  Outpatient Medications Marked as Taking for the 11/16/23 encounter [facility])   Medication Sig    acetaminophen (TYLENOL) 325 mg Oral Tablet Take 2 Tablets (650 mg total) by mouth Every 4 hours as needed for Pain or Fever    apixaban (ELIQUIS) 5 mg Oral Tablet Take 1 Tablet (5 mg total) by mouth Twice daily    atorvastatin (LIPITOR) 20 mg Oral Tablet Take 1 Tablet (20 mg total) by mouth Every night    baclofen (LIORESAL) 5 mg Oral Tablet Take 1 Tablet (5  mg total) by mouth Three times a day as needed    bisacodyL (DULCOLAX) 5 mg Oral Tablet, Delayed Release (E.C.) Take 3 Tablets (15 mg total) by mouth Every 72 hours as needed for Constipation    budesonide-formoteroL (SYMBICORT) 80-4.5 mcg/actuation Inhalation oral inhaler Take 1 Puff by inhalation Daily    busPIRone (BUSPAR) 5 mg Oral Tablet Take 1 Tablet (5 mg total) by mouth Twice daily    calcium carbonate-vitamin D3 (CALCIUM 600 + D) 600 mg-10 mcg (400 unit) Oral Tablet Take 1 Tablet by mouth Twice daily    cranberry fruit (CRANBERRY) 450 mg Oral Tablet Take 1 Tablet (450 mg total) by mouth Daily    dextromethorphan-guaiFENesin (ROBAFEN DM COUGH) 10-100 mg/5 mL Oral Liquid Take 10 mL by mouth Every 4 hours as needed    docusate sodium (COLACE) 100 mg Oral Capsule Take 1 Capsule (100 mg total) by mouth Once a day    ergocalciferol, vitamin D2, (DRISDOL) 1,250 mcg (50,000 unit) Oral Capsule Take 1 Capsule (50,000 Units total) by mouth On the first of the month    escitalopram oxalate (LEXAPRO) 5 mg Oral Tablet Take 1 Tablet (5 mg total) by mouth Daily    ferrous sulfate (FEOSOL) 325 mg (65 mg iron) Oral Tablet Take 1 Tablet (325 mg total) by mouth Once a day    [EXPIRED] furosemide (LASIX) 20 mg Oral Tablet Take 0.5 Tablets  (10 mg total) by mouth Daily    gabapentin (NEURONTIN) 100 mg Oral Capsule Take 1 Capsule (100 mg total) by mouth Every night    HYDROcodone-acetaminophen (NORCO) 5-325 mg Oral Tablet Take 1 Tablet by mouth Three times a day as needed for Pain    hydrocortisone acetate (ANUSOL-HC) 25 mg Rectal Suppository Insert 1 Suppository (25 mg total) into the rectum Four times a day as needed    insulin glargine 100 unit/mL Subcutaneous injection (vial) Inject 18 Units under the skin Every morning    insulin glargine 100 unit/mL Subcutaneous injection (vial) Inject 8 Units under the skin Every night    insulin lispro 100 unit/mL Subcutaneous Solution Inject under the skin Three times daily with meals Per sliding scale    ipratropium-albuterol 0.5 mg-3 mg(2.5 mg base)/3 mL Solution for Nebulization Take 3 mL by nebulization Three times a day    JANUVIA 100 mg Oral Tablet Take 1 Tablet (100 mg total) by mouth Daily    levothyroxine (SYNTHROID) 75 mcg Oral Tablet Take 1 Tablet (75 mcg total) by mouth Every morning    loperamide (IMODIUM) 2 mg Oral Capsule Take 1 Capsule (2 mg total) by mouth Once, as needed After each loose stool, not to exceed 4 caplets in 24 hours    loratadine (CLARITIN) 10 mg Oral Tablet Take 1 Tablet (10 mg total) by mouth Daily    mag hydrox/aluminum hyd/simeth (RULOX ORAL) Take 20 mL by mouth Every 4 hours as needed No more than 80ml in a 24 hour period    magnesium hydroxide (MILK OF MAGNESIA) 400 mg/5 mL Oral Suspension Take 30 mL (2,400 mg total) by mouth Once per day as needed    magnesium oxide (MAG-OX) 400 mg Oral Tablet Take 1 Tablet (400 mg total) by mouth Three times a day    mineral oil (FLEET) Rectal Enema Insert 133 mL into the rectum Every 72 hours as needed    Olive Oil (SWEET OIL) Oil Instill 1 Drop into both ears Every night    omeprazole (PRILOSEC) 20 mg Oral Capsule, Delayed Release(E.C.)  Take 1 Capsule (20 mg total) by mouth Daily    ondansetron (ZOFRAN ODT) 4 mg Oral Tablet, Rapid  Dissolve Take 1 Tablet (4 mg total) by mouth Every 8 hours as needed for Nausea/Vomiting    phenol 1.4% (CHLORASEPTIC) 1.4 % Mucous Membrane Aerosol, Spray Use 2 Sprays in the mouth or throat Once, as needed    phenyleph-min oil-petrolatum (PREPARATION H) 0.25-14-74.9 % Rectal Ointment Insert into the rectum Once, as needed    polyethylene glycol 400 (VISINE DRY EYE RELIEF) 1 % Ophthalmic Drops Instill 1 Drop into both eyes Twice daily    pramipexole (MIRAPEX) 0.125 mg Oral Tablet Take 1 Tablet (0.125 mg total) by mouth Three times a day    pramipexole (MIRAPEX) 1 mg Oral Tablet Take 1 Tablet (1 mg total) by mouth Every night    Pyridoxine (VITAMIN B6) 250 mg Oral Tablet Take 1 Tablet (250 mg total) by mouth Twice daily    sennosides-docusate sodium (SENOKOT-S) 8.6-50 mg Oral Tablet Take 1 Tablet by mouth Every night    simethicone (MYLICON) 80 mg Oral Tablet, Chewable Chew 1 Tablet (80 mg total) Three times a day       INPATIENT MEDICATIONS:    Current Facility-Administered Medications:     amoxicillin-clavulanate (AUGMENTIN) 875-125mg  per tablet, 1 Tablet, Oral, 2x/day, Ghimire, Calvin, MD, 1 Tablet at 11/19/23 2102    apixaban (ELIQUIS) tablet, 5 mg, Oral, 2x/day, Pamella Boer, FNP-BC, 5 mg at 11/19/23 2100    atorvastatin (LIPITOR) tablet, 20 mg, Oral, NIGHTLY, Pamella Boer, FNP-BC, 20 mg at 11/19/23 2100    bisacodyl (DULCOLAX) enteric coated tablet, 5 mg, Oral, Q72H PRN, Ghimire, Calvin, MD    busPIRone (BUSPAR) tablet, 5 mg, Oral, Daily, Pamella Boer, FNP-BC, 5 mg at 11/19/23 9528    Correction/SSIP insulin lispro 100 units/mL injection, 2-9 Units, Subcutaneous, 4x/day AC, Pamella Boer, FNP-BC, 5 Units at 11/19/23 2058    dextromethorphan-guaiFENesin (ROBITUSSIN DM) 10-100mg  per 5mL oral liquid, 10 mL, Oral, Q4H PRN, Ghimire, Calvin, MD, 10 mL at 11/17/23 2013    dextrose (GLUTOSE) 40% oral gel, 15 g, Oral, Q15 Min PRN, Pamella Boer, FNP-BC    dextrose 50% (0.5 g/mL) injection - syringe, 12.5 g,  Intravenous, Q15 Min PRN, Pamella Boer, FNP-BC, 25 mL at 11/17/23 1432    docusate sodium (COLACE) capsule, 100 mg, Oral, Daily, Ghimire, Calvin, MD, 100 mg at 11/19/23 4132    escitalopram (LEXAPRO) tablet, 5 mg, Oral, Daily, Pamella Boer, FNP-BC, 5 mg at 11/19/23 4401    ferrous sulfate 324 mg (65 mg elemental IRON) tablet, 324 mg, Oral, Daily, Ghimire, Moishe Angel, MD, 324 mg at 11/19/23 0272    furosemide (LASIX) tablet, 40 mg, Oral, Daily, Ghimire, Calvin, MD, 40 mg at 11/19/23 5366    glucagon injection 1 mg, 1 mg, IntraMUSCULAR, Once PRN, Pamella Boer, FNP-BC    HYDROcodone-acetaminophen (NORCO) 5-325 mg per tablet, 1 Tablet, Oral, Q6H PRN, Pamella Boer, FNP-BC, 1 Tablet at 11/18/23 0025    insulin glargine 100 units/mL injection, 5 Units, Subcutaneous, Daily, Merry Abo, MD, 5 Units at 11/19/23 1453    insulin lispro 100 units/mL injection, 3 Units, Subcutaneous, 3x/day Creola Doheny, MD, 3 Units at 11/19/23 1720    ipratropium-albuterol 0.5 mg-3 mg(2.5 mg base)/3 mL Solution for Nebulization, 3 mL, Nebulization, Q4H PRN, Pamella Boer, FNP-BC, 3 mL at 11/19/23 1220    levothyroxine (SYNTHROID) tablet, 75 mcg, Oral, QAM, Pamella Boer, FNP-BC, 75 mcg at 11/19/23 0612    loperamide (IMODIUM) capsule, 2 mg, Oral,  Once PRN, Rhetta Cellar, MD    magnesium hydroxide (MILK OF MAGNESIA) 400mg  per 5mL oral liquid, 30 mL, Oral, Daily PRN, Ghimire, Calvin, MD    magnesium oxide (MAG-OX) 400mg  (241.3 mg elemental magnesium) tablet, 400 mg, Oral, 3x/day, Ghimire, Calvin, MD, 400 mg at 11/19/23 2100    methylPREDNISolone sod succ (SOLU-medrol) 40 mg/mL injection, 40 mg, Intravenous, Daily, Ghimire, Moishe Angel, MD, 40 mg at 11/19/23 1610    metoprolol tartrate (LOPRESSOR) tablet, 12.5 mg, Oral, 2x/day, Ghimire, Moishe Angel, MD, 12.5 mg at 11/19/23 2116    nystatin (NYSTOP) 100,000 units/g topical powder, , Apply Topically, 2x/day, Ghimire, Calvin, MD, Given at 11/19/23 2102    perflutren lipid microspheres (DEFINITY) 1.3  mL in NS 10 mL (tot vol) injection, 2 mL, Intravenous, Cardiology Once PRN, Rhetta Cellar, MD, 3 mL at 11/17/23 1154    pramipexole (MIRAPEX) tablet, 0.125 mg, Oral, 3x/day, Pamella Boer, FNP-BC, 0.125 mg at 11/19/23 2200    pyridOXINE Vitamin B6 tablet, 250 mg, Oral, 2x/day, Ghimire, Moishe Angel, MD, 250 mg at 11/19/23 2113    ramelteon (ROZEREM) tablet, 8 mg, Oral, NIGHTLY, Ghimire, Moishe Angel, MD, 8 mg at 11/19/23 1721    [COMPLETED] sodium zirconium cyclosilicate (LOKELMA) powder, 10 g, Oral, 3 times per day, 10 g at 11/20/23 0043 **FOLLOWED BY** sodium zirconium cyclosilicate (LOKELMA) powder, 10 g, Oral, Daily with Lunch, Ghimire, Moishe Angel, MD    zinc oxide 56.7 g, clotrimazole (LOTRIMIN) 15 g, hydrocortisone (CORTIZONE-10) 28 g compounded topical cream, , Apply Topically, Q30 Min PRN, Rhetta Cellar, MD     ALLERGIES:  Allergies   Allergen Reactions    Cefaclor Rash    Nitrofurantoin Rash    Quinine Rash    Sulfa (Sulfonamides) Rash    Ciprofloxacin     Dulaglutide     Fluconazole     Nitrofurantoin Macrocrystal     Quinidine-Quinine Analogues (Cinchona Alkaloids)         FAMILY HISTORY:  Family Medical History:       Problem Relation (Age of Onset)    No Known Problems Mother, Father              SOCIAL HISTORY:  Social History     Tobacco Use    Smoking status: Never    Smokeless tobacco: Never   Vaping Use    Vaping status: Never Used   Substance Use Topics    Alcohol use: Never    Drug use: Never         REVIEW OF SYSTEMS:     ROS: All systems reviewed and negative except for as above.    EXAMINATION:   Vitals:   BP 97/63   Pulse 58   Temp 36.2 C (97.2 F)   Resp 18   Ht 1.626 m (5\' 4" )   Wt 72.7 kg (160 lb 6 oz)   SpO2 96%   BMI 27.53 kg/m       Full Exam Limited as Encounter is Virtual  General: Well appearing female in no acute distress.  Psychiatric: Normal mood and affect  Neuro: Alert and oriented. Speech fluent.   HEENT: Head normocephalic, atraumatic. EOMI, conjunctiva clear. No  proptosis.  Thyroid/Neck: Trachea midline.   Lungs: Normal respiratory effort.   Extremities: No edema or erythema noted.  Skin: No visible rashes.    DATA REVIEWED:   I have reviewed previous labs, tests, imaging, and notes.    Recent Labs     11/18/23  0534 11/18/23  0738 11/18/23  1210 11/18/23  1557 11/18/23  1947 11/19/23  0733 11/19/23  1014 11/19/23  1113 11/19/23  1611 11/19/23  2054 11/20/23  0206 11/20/23  0250   GLUIP 328* 267* 278* 270* 255* 171* 193* 287* 270* 294* 57* 90       Lab Results   Component Value Date    HA1C 11.2 (H) 11/17/2023        Results for orders placed or performed during the hospital encounter of 11/16/23 (from the past 24 hours)   CBC/DIFF    Narrative    The following orders were created for panel order CBC/DIFF.  Procedure                               Abnormality         Status                     ---------                               -----------         ------                     CBC WITH DIFF[[id number]]                Abnormal            Final result                 Please view results for these tests on the individual orders.   COMPREHENSIVE METABOLIC PANEL, NON-FASTING   Result Value Ref Range    SODIUM 137 136 - 145 mmol/L    POTASSIUM 5.1 3.5 - 5.1 mmol/L    CHLORIDE 97 (L) 98 - 107 mmol/L    CO2 TOTAL 32 (H) 21 - 31 mmol/L    ANION GAP 8 4 - 13 mmol/L    BUN 51 (H) 7 - 25 mg/dL    CREATININE 1.61 0.96 - 1.30 mg/dL    BUN/CREA RATIO 41 (H) 6 - 22    ESTIMATED GFR 44 (L) >59 mL/min/1.49m^2    ALBUMIN 3.6 3.5 - 5.7 g/dL    CALCIUM 8.9 8.6 - 04.5 mg/dL    GLUCOSE 409 (H) 74 - 109 mg/dL    ALKALINE PHOSPHATASE 65 34 - 104 U/L    ALT (SGPT) 71 (H) 7 - 52 U/L    AST (SGOT) 75 (H) 13 - 39 U/L    BILIRUBIN TOTAL 1.0 0.3 - 1.0 mg/dL    PROTEIN TOTAL 6.4 6.4 - 8.9 g/dL    ALBUMIN/GLOBULIN RATIO 1.3 0.8 - 1.4    OSMOLALITY, CALCULATED 292 (H) 270 - 290 mOsm/kg    CALCIUM, CORRECTED 9.2 8.9 - 10.8 mg/dL    GLOBULIN 2.8 2.0 - 3.5    Narrative    Estimated Glomerular Filtration Rate  (eGFR) is calculated using the CKD-EPI (2021) equation, intended for patients 5 years of age and older. If gender is not documented or "unknown", there will be no eGFR calculation.     MAGNESIUM   Result Value Ref Range    MAGNESIUM 2.2 1.9 - 2.7 mg/dL   CBC WITH DIFF   Result Value Ref Range    WBC 8.4 3.8 - 11.8 x10^3/uL    RBC 3.78 3.63 - 4.92 x10^6/uL    HGB 11.7 10.9 - 14.3  g/dL    HCT 16.1 09.6 - 04.5 %    MCV 94.4 75.5 - 95.3 fL    MCH 31.0 24.7 - 32.8 pg    MCHC 32.8 32.3 - 35.6 g/dL    RDW 40.9 81.1 - 91.4 %    PLATELETS 139 (L) 140 - 440 x10^3/uL    MPV 7.5 (L) 7.9 - 10.8 fL    NEUTROPHIL % 73 43 - 77 %    LYMPHOCYTE % 19 16 - 46 %    MONOCYTE % 6 4 - 11 %    EOSINOPHIL % 1 1 - 7 %    BASOPHIL % 1 0 - 1 %    NEUTROPHIL # 6.10 1.90 - 8.20 x10^3/uL    LYMPHOCYTE # 1.60 1.10 - 3.10 x10^3/uL    MONOCYTE # 0.50 0.20 - 0.90 x10^3/uL    EOSINOPHIL # 0.10 0.00 - 0.50 x10^3/uL    BASOPHIL # 0.10 0.00 - 0.10 x10^3/uL   CBC/DIFF    Narrative    The following orders were created for panel order CBC/DIFF.  Procedure                               Abnormality         Status                     ---------                               -----------         ------                     CBC WITH NWGN[[id number]]                                                                 Please view results for these tests on the individual orders.   POC BLOOD GLUCOSE (RESULTS)   Result Value Ref Range    GLUCOSE, POC 171 (H) 70 - 100 mg/dl   POC BLOOD GLUCOSE (RESULTS)   Result Value Ref Range    GLUCOSE, POC 193 (H) 70 - 100 mg/dl   POC BLOOD GLUCOSE (RESULTS)   Result Value Ref Range    GLUCOSE, POC 287 (H) 70 - 100 mg/dl   POC BLOOD GLUCOSE (RESULTS)   Result Value Ref Range    GLUCOSE, POC 270 (H) 70 - 100 mg/dl   POC BLOOD GLUCOSE (RESULTS)   Result Value Ref Range    GLUCOSE, POC 294 (H) 70 - 100 mg/dl   POC BLOOD GLUCOSE (RESULTS)   Result Value Ref Range    GLUCOSE, POC 57 (L) 70 - 100 mg/dl   POC BLOOD GLUCOSE (RESULTS)   Result  Value Ref Range    GLUCOSE, POC 90 70 - 100 mg/dl       ASSESSMENT & RECOMMENDATIONS:     Jordan Buckley is a 83 y.o. female with PMH of type 2 diabetes, chronic obstructive pulmonary disease, CAD, CVA, dementia, hypertension, has been admitted to [facility] for for fluid overload and pneumonia. Endocrinology is consulted for management of diabetes.  Uncontrolled type 2 diabetes mellitus with hyperglycemia  -Last A1c this admission was 11.2  -Home regimen:  Lantus and lispro (doses unknown)  -Current regimen:  Lispro SSI sensitive scale  -Diet: DIET DIABETIC Calorie amount: CC 1800; Do you want to initiate MNT Protocol? Yes; Restrict fluids to: 1200 ML    Recommendations:   -Start Lantus 5 units daily   -Start lispro 3 units with meals t.i.d.   -Continue with sensitive scale SSI    Recommendations discussed with primary team, PRN HOSPITALIST 1.     Thank you for this consult. We will continue to follow. Please page with questions.    Merry Abo  Endocrinology & Metabolism  11/19/23                    I saw and examined the patient. I reviewed the fellow's note. I agree with the findings and plan of care as documented in the fellow's note. Any exceptions/additions are edited/noted.      Radford Buffy, MD  Assistant Professor  Endocrinology & Metabolism  [place] [facility]    11/20/2023 13:11   On the day of the encounter, a total of  40 minutes were spent on this patient encounter including review of historical information, examination, and documentation.

## 2023-11-20 NOTE — Telemedicine Consult (Signed)
 Brief endocrine recommendations     Jordan Buckley is a 83 y.o. female with PMH of type 2 diabetes, chronic obstructive pulmonary disease, CAD, CVA, dementia, hypertension, has been admitted to [facility] for for fluid overload and pneumonia. Endocrinology is consulted for management of diabetes.     Uncontrolled type 2 diabetes mellitus with hyperglycemia - improving  -Last A1c this admission was 11.2  -Home regimen:  Lantus and lispro (doses unknown)  -Current regimen:  Lispro SSI sensitive scale  -Diet: DIET DIABETIC Calorie amount: CC 1800; Do you want to initiate MNT Protocol? Yes; Restrict fluids to: 1200 ML     Patient received 5 units of lispro at bedtime for glucose of 294 around 2100, her glucose dropped to 57 around 2:00 a.m.    Recommendations:   -Continue Lantus 5 units daily   -Continue lispro 3 units with meals t.i.d.   -Continue sensitive scale SSI with meals, we will remove bedtime coverage     Recommendations discussed with primary team, PRN HOSPITALIST 1.      Thank you for this consult. We will continue to follow. Please page with questions.     [facility]  Endocrinology & Metabolism, PGY4  11/20/2023      I did not see the patient. I reviewed the patient's chart and the fellow's note. I discussed the patient with the fellow. I agree with the findings and plan of care as documented in the fellow's note. Any exceptions/additions are edited/noted.    Radford Buffy, MD  Assistant Professor  Endocrinology & Metabolism  [place] [facility]    11/20/2023 21:35

## 2023-11-20 NOTE — Respiratory Therapy (Signed)
 Pt does not want to go back on BiPAP.

## 2023-11-20 NOTE — Progress Notes (Signed)
 [facility]               IP PROGRESS NOTE      Jordan Buckley, Jordan Buckley  Date of Admission:  11/16/2023  Date of Birth:  Jan 06, 1941  Date of Service:  11/20/2023    Hospital Day:  LOS: 3 days       Subjective:   Ayah Cozzolino is a 83 y.o. female who is seen and examined for pneumonia fluid overload, hypoglycemia, metabolic encephalopathy. Patient was having nocturnal desat study oxygen saturation dropped to 70 and on room air.  O2 2 L applied sats remained 80-82 oxygen increased to 3 L. patient placed on BiPAP later this morning.  Renal function back to baseline.    4/11 seen and examined.  No new complaints today.  No events overnight.  Remains hypercapnic - refused BiPAP last night.  Await placement.  Seen by tele Endocrinology.  Started on Lantus 5 units-glucose dropped to 57.          Vital Signs:  Temp (24hrs) Max:36.7 C (98.1 F)      Temperature: 36.4 C (97.6 F)  BP (Non-Invasive): 130/86  MAP (Non-Invasive): 97 mmHG  Heart Rate: 100  Respiratory Rate: 18  SpO2: 93 %    Current Medications:  amoxicillin-clavulanate (AUGMENTIN) 875-125mg  per tablet, 1 Tablet, Oral, 2x/day  apixaban (ELIQUIS) tablet, 5 mg, Oral, 2x/day  atorvastatin (LIPITOR) tablet, 20 mg, Oral, NIGHTLY  bisacodyl (DULCOLAX) enteric coated tablet, 5 mg, Oral, Q72H PRN  busPIRone (BUSPAR) tablet, 5 mg, Oral, Daily  Correction/SSIP insulin lispro 100 units/mL injection, 2-9 Units, Subcutaneous, 4x/day AC  dextromethorphan-guaiFENesin (ROBITUSSIN DM) 10-100mg  per 5mL oral liquid, 10 mL, Oral, Q4H PRN  dextrose (GLUTOSE) 40% oral gel, 15 g, Oral, Q15 Min PRN  dextrose 50% (0.5 g/mL) injection - syringe, 12.5 g, Intravenous, Q15 Min PRN  docusate sodium (COLACE) capsule, 100 mg, Oral, Daily  escitalopram (LEXAPRO) tablet, 5 mg, Oral, Daily  ferrous sulfate 324 mg (65 mg elemental IRON) tablet, 324 mg, Oral, Daily  [START ON 11/21/2023] furosemide (LASIX) tablet, 20 mg, Oral, Daily  glucagon injection 1 mg, 1 mg, IntraMUSCULAR, Once  PRN  HYDROcodone-acetaminophen (NORCO) 5-325 mg per tablet, 1 Tablet, Oral, Q6H PRN  [Held by provider] insulin glargine 100 units/mL injection, 5 Units, Subcutaneous, Daily  [Held by provider] insulin lispro 100 units/mL injection, 3 Units, Subcutaneous, 3x/day AC  ipratropium-albuterol 0.5 mg-3 mg(2.5 mg base)/3 mL Solution for Nebulization, 3 mL, Nebulization, Q4H PRN  levothyroxine (SYNTHROID) tablet, 75 mcg, Oral, QAM  loperamide (IMODIUM) capsule, 2 mg, Oral, Once PRN  magnesium hydroxide (MILK OF MAGNESIA) 400mg  per 5mL oral liquid, 30 mL, Oral, Daily PRN  magnesium oxide (MAG-OX) 400mg  (241.3 mg elemental magnesium) tablet, 400 mg, Oral, 3x/day  metoprolol tartrate (LOPRESSOR) tablet, 12.5 mg, Oral, 2x/day  nystatin (NYSTOP) 100,000 units/g topical powder, , Apply Topically, 2x/day  perflutren lipid microspheres (DEFINITY) 1.3 mL in NS 10 mL (tot vol) injection, 2 mL, Intravenous, Cardiology Once PRN  pramipexole (MIRAPEX) tablet, 0.125 mg, Oral, 3x/day  predniSONE (DELTASONE) tablet, 40 mg, Oral, Daily with Breakfast  pyridOXINE Vitamin B6 tablet, 250 mg, Oral, 2x/day  ramelteon (ROZEREM) tablet, 8 mg, Oral, NIGHTLY  zinc oxide 56.7 g, clotrimazole (LOTRIMIN) 15 g, hydrocortisone (CORTIZONE-10) 28 g compounded topical cream, , Apply Topically, Q30 Min PRN        Current Orders:  Active Orders   Lab    CBC     Frequency: ONE TIME     Number of Occurrences:  1 Occurrences    CBC     Frequency: ONE TIME     Number of Occurrences: 1 Occurrences    COMPREHENSIVE METABOLIC PANEL, NON-FASTING     Frequency: ONE TIME     Number of Occurrences: 1 Occurrences    COMPREHENSIVE METABOLIC PANEL, NON-FASTING     Frequency: ONE TIME     Number of Occurrences: 1 Occurrences   Diet    DIET DIABETIC Calorie amount: CC 1800; Do you want to initiate MNT Protocol? Yes; Restrict fluids to: 1200 ML     Frequency: All Meals     Number of Occurrences: 1 Occurrences   Nursing    ACTIVITY     Frequency: UNTIL DISCONTINUED     Number  of Occurrences: Until Specified    APIXABAN NURSING ORDER     Frequency: UNTIL DISCONTINUED     Number of Occurrences: Until Specified     Order Comments: Nursing Instructions:    1.  Print apixaban (Eliquis) guide using the link in this order.   2.  Nurse to provide apixaban patient guide to all patients and be sure that all new starts have received education by the trained anticoagulation educator.  If additional questions, contact pharmacy.           APPLY SEQUENTIAL COMPRESSION DEVICE     Frequency: ONE TIME     Number of Occurrences: 1 Occurrences    HYPOGLYCEMIA MANAGEMENT - CONSCIOUS PATIENT W/DIET ORDER     Frequency: UNTIL DISCONTINUED     Number of Occurrences: Until Specified    HYPOGLYCEMIA MANAGEMENT - UNCONSCIOUS/ALTERED/NPO PATIENT     Frequency: UNTIL DISCONTINUED     Number of Occurrences: Until Specified    HYPOGLYCEMIA TREATMENT ALGORITHM     Frequency: UNTIL DISCONTINUED     Number of Occurrences: Until Specified    INCENTIVE SPIROMETRY NURSING     Frequency: Q1H WA     Number of Occurrences: 250 Occurrences    INTAKE AND OUTPUT Q4H     Frequency: Q4H     Number of Occurrences: Until Specified    MAINTAIN SEQUENTIAL COMPRESSION DEVICE     Frequency: CONTINUOUS     Number of Occurrences: Until Specified    NOTIFY MD     Frequency: PRN     Number of Occurrences: Until Specified    Notify MD Vital Signs     Frequency: PRN     Number of Occurrences: Until Specified    ORAL REHYDRATION FLUIDS     Frequency: UNTIL DISCONTINUED     Number of Occurrences: Until Specified    PT IS INTERMEDIATE RISK FOR VENOUS THROMBOEMBOLISM     Frequency: CONTINUOUS     Number of Occurrences: Until Specified    PULSE OXIMETRY Q4H     Frequency: Q4H     Number of Occurrences: Until Specified    TELEMETRY MONITORING - Continuous     Frequency: CONTINUOUS     Number of Occurrences: Until Specified    VITAL SIGNS  Q4H     Frequency: Q4H     Number of Occurrences: Until Specified    WAS PATIENT ON APIXABAN PRIOR TO ADMISSION?      Frequency: UNTIL DISCONTINUED     Number of Occurrences: Until Specified   Code Status    DO NOT ATTEMPT RESUSCITATION (DNAR/DNI/DNR-CC)     Frequency: CONTINUOUS     Number of Occurrences: Until Specified     Order Comments: Do not use mechanical ventilation or  intubation in the event of respiratory failure or other medication indication. May consider use of less invasive airway support such as BiPAP.    In the event of a pulseless cardiac arrest, do NOT perform ACLS (advanced cardiac life support) to attempt resuscitation. IE - DO NOT perform chest compressions/defibrillation or use mechanical ventilation or intubation in the setting of pulseless cardiac arrest.       Consult    IP CONSULT TO ENDOCRINE/METABOLIC - TELEMEDICINE     Frequency: ONE TIME     Number of Occurrences: 1 Occurrences    IP CONSULT TO PALLIATIVE CARE     Frequency: ONE TIME     Number of Occurrences: 1 Occurrences   Respiratory Care    ACCUPAP SYSTEM     Frequency: Q6H while awake     Number of Occurrences: Until Specified    INCENTIVE SPIROMETRY - RT INSTRUCT     Frequency: ONE TIME     Number of Occurrences: 1 Occurrences    NONINVASIVE POSITIVE PRESSURE VENTILATION PROTOCOL (RT TO DETERMINE SETTINGS)     Frequency: Q4H     Number of Occurrences: Until Specified   Point of Care Testing    PERFORM POC WHOLE BLOOD GLUCOSE     Frequency: TID AC & HS     Number of Occurrences: Until Specified   Medications    amoxicillin-clavulanate (AUGMENTIN) 875-125mg  per tablet     Frequency: 2x/day     Dose: 1 Tablet     Route: Oral    apixaban (ELIQUIS) tablet     Frequency: 2x/day     Dose: 5 mg     Route: Oral    atorvastatin (LIPITOR) tablet     Frequency: NIGHTLY     Dose: 20 mg     Route: Oral    bisacodyl (DULCOLAX) enteric coated tablet     Frequency: Q72H PRN     Dose: 5 mg     Route: Oral    busPIRone (BUSPAR) tablet     Frequency: Daily     Dose: 5 mg     Route: Oral    Correction/SSIP insulin lispro 100 units/mL injection     Frequency:  4x/day AC     Dose: 2-9 Units     Route: Subcutaneous    dextromethorphan-guaiFENesin (ROBITUSSIN DM) 10-100mg  per 5mL oral liquid     Frequency: Q4H PRN     Dose: 10 mL     Route: Oral    dextrose (GLUTOSE) 40% oral gel     Frequency: Q15 Min PRN     Dose: 15 g     Route: Oral    dextrose 50% (0.5 g/mL) injection - syringe     Frequency: Q15 Min PRN     Dose: 12.5 g     Route: Intravenous    docusate sodium (COLACE) capsule     Frequency: Daily     Dose: 100 mg     Route: Oral    escitalopram (LEXAPRO) tablet     Frequency: Daily     Dose: 5 mg     Route: Oral    ferrous sulfate 324 mg (65 mg elemental IRON) tablet     Frequency: Daily     Dose: 324 mg     Route: Oral    furosemide (LASIX) tablet     Frequency: Daily     Dose: 20 mg     Route: Oral    glucagon injection 1 mg  Frequency: Once PRN     Dose: 1 mg     Route: IntraMUSCULAR    HYDROcodone-acetaminophen (NORCO) 5-325 mg per tablet     Frequency: Q6H PRN     Dose: 1 Tablet     Route: Oral    insulin glargine 100 units/mL injection     Frequency: Daily     Dose: 5 Units     Route: Subcutaneous    insulin lispro 100 units/mL injection     Frequency: 3x/day AC     Dose: 3 Units     Route: Subcutaneous    ipratropium-albuterol 0.5 mg-3 mg(2.5 mg base)/3 mL Solution for Nebulization     Frequency: Q4H PRN     Dose: 3 mL     Route: Nebulization    levothyroxine (SYNTHROID) tablet     Frequency: QAM     Dose: 75 mcg     Route: Oral    loperamide (IMODIUM) capsule     Frequency: Once PRN     Dose: 2 mg     Route: Oral    magnesium hydroxide (MILK OF MAGNESIA) 400mg  per 5mL oral liquid     Frequency: Daily PRN     Dose: 30 mL     Route: Oral    magnesium oxide (MAG-OX) 400mg  (241.3 mg elemental magnesium) tablet     Frequency: 3x/day     Dose: 400 mg     Route: Oral    metoprolol tartrate (LOPRESSOR) tablet     Frequency: 2x/day     Dose: 12.5 mg     Route: Oral    nystatin (NYSTOP) 100,000 units/g topical powder     Frequency: 2x/day     Route: Apply Topically     perflutren lipid microspheres (DEFINITY) 1.3 mL in NS 10 mL (tot vol) injection     Frequency: Cardiology Once PRN     Dose: 2 mL     Route: Intravenous    pramipexole (MIRAPEX) tablet     Frequency: 3x/day     Dose: 0.125 mg     Route: Oral    predniSONE (DELTASONE) tablet     Frequency: Daily with Breakfast     Dose: 40 mg     Route: Oral    pyridOXINE Vitamin B6 tablet     Frequency: 2x/day     Dose: 250 mg     Route: Oral    ramelteon (ROZEREM) tablet     Frequency: NIGHTLY     Dose: 8 mg     Route: Oral    zinc oxide 56.7 g, clotrimazole (LOTRIMIN) 15 g, hydrocortisone (CORTIZONE-10) 28 g compounded topical cream     Frequency: Q30 Min PRN     Route: Apply Topically        Review of Systems:  Focused review of system was completed. Refer to the HPI for ROS details.     Today's Physical Exam:  Physical Exam  Vitals reviewed.   HENT:      Head: Normocephalic and atraumatic.      Mouth/Throat:      Mouth: Mucous membranes are moist.      Pharynx: Oropharynx is clear.   Cardiovascular:      Rate and Rhythm: Regular rhythm. Tachycardia present.      Pulses: Normal pulses.      Heart sounds: Normal heart sounds.   Pulmonary:      Effort: Pulmonary effort is normal.      Breath sounds: Normal breath sounds.  Abdominal:      General: Bowel sounds are normal.      Palpations: Abdomen is soft.   Skin:     General: Skin is warm and dry.   Neurological:      Mental Status: She is alert and oriented to person, place, and time.          I/O:  I/O last 24 hours:    Intake/Output Summary (Last 24 hours) at 11/20/2023 1132  Last data filed at 11/19/2023 1937  Gross per 24 hour   Intake 780 ml   Output --   Net 780 ml     I/O current shift:  No intake/output data recorded.    Labs:  Reviewed: I have reviewed all lab results.  Lab Results Today:    Results for orders placed or performed during the hospital encounter of 11/16/23 (from the past 24 hours)   POC BLOOD GLUCOSE (RESULTS)   Result Value Ref Range    GLUCOSE, POC 270  (H) 70 - 100 mg/dl   POC BLOOD GLUCOSE (RESULTS)   Result Value Ref Range    GLUCOSE, POC 294 (H) 70 - 100 mg/dl   POC BLOOD GLUCOSE (RESULTS)   Result Value Ref Range    GLUCOSE, POC 57 (L) 70 - 100 mg/dl   POC BLOOD GLUCOSE (RESULTS)   Result Value Ref Range    GLUCOSE, POC 90 70 - 100 mg/dl   COMPREHENSIVE METABOLIC PANEL, NON-FASTING   Result Value Ref Range    SODIUM 142 136 - 145 mmol/L    POTASSIUM 3.4 (L) 3.5 - 5.1 mmol/L    CHLORIDE 95 (L) 98 - 107 mmol/L    CO2 TOTAL 43 (HH) 21 - 31 mmol/L    ANION GAP 4 4 - 13 mmol/L    BUN 46 (H) 7 - 25 mg/dL    CREATININE 1.61 0.96 - 1.30 mg/dL    BUN/CREA RATIO 38 (H) 6 - 22    ESTIMATED GFR 44 (L) >59 mL/min/1.48m^2    ALBUMIN 3.3 (L) 3.5 - 5.7 g/dL    CALCIUM 8.8 8.6 - 04.5 mg/dL    GLUCOSE 79 74 - 409 mg/dL    ALKALINE PHOSPHATASE 56 34 - 104 U/L    ALT (SGPT) 67 (H) 7 - 52 U/L    AST (SGOT) 62 (H) 13 - 39 U/L    BILIRUBIN TOTAL 0.8 0.3 - 1.0 mg/dL    PROTEIN TOTAL 5.7 (L) 6.4 - 8.9 g/dL    ALBUMIN/GLOBULIN RATIO 1.4 0.8 - 1.4    OSMOLALITY, CALCULATED 294 (H) 270 - 290 mOsm/kg    CALCIUM, CORRECTED 9.4 8.9 - 10.8 mg/dL    GLOBULIN 2.4 2.0 - 3.5   MAGNESIUM   Result Value Ref Range    MAGNESIUM 2.3 1.9 - 2.7 mg/dL   CBC WITH DIFF   Result Value Ref Range    WBC 5.9 3.8 - 11.8 x10^3/uL    RBC 3.66 3.63 - 4.92 x10^6/uL    HGB 11.3 10.9 - 14.3 g/dL    HCT 81.1 91.4 - 78.2 %    MCV 94.4 75.5 - 95.3 fL    MCH 31.0 24.7 - 32.8 pg    MCHC 32.8 32.3 - 35.6 g/dL    RDW 95.6 21.3 - 08.6 %    PLATELETS 134 (L) 140 - 440 x10^3/uL    MPV 6.9 (L) 7.9 - 10.8 fL    NEUTROPHIL % 78 (H) 43 - 77 %    LYMPHOCYTE %  14 (L) 16 - 46 %    MONOCYTE % 7 4 - 11 %    EOSINOPHIL % 0 (L) 1 - 7 %    BASOPHIL % 0 0 - 1 %    NEUTROPHIL # 4.60 1.90 - 8.20 x10^3/uL    LYMPHOCYTE # 0.80 (L) 1.10 - 3.10 x10^3/uL    MONOCYTE # 0.40 0.20 - 0.90 x10^3/uL    EOSINOPHIL # 0.00 0.00 - 0.50 x10^3/uL    BASOPHIL # 0.00 0.00 - 0.10 x10^3/uL   POC BLOOD GLUCOSE (RESULTS)   Result Value Ref Range    GLUCOSE, POC  139 (H) 70 - 100 mg/dl   POC BLOOD GLUCOSE (RESULTS)   Result Value Ref Range    GLUCOSE, POC 245 (H) 70 - 100 mg/dl     Micro Results: No results found for any visits on 11/16/23 (from the past 24 hours).  Images:   No results found.     XR AP MOBILE CHEST   Final Result      SHALLOW INSPIRATION LIMITS THE EXAM.  NO ACUTE FINDINGS.         Radiologist location ID: [id number]         US  ABDOMEN (FOR ASCITES)   Final Result   No ascites is noted on the current study.         Radiologist location ID: [id number]         XR AP MOBILE CHEST   Final Result   NO ACUTE FINDINGS.                  Radiologist location ID: [id number]              Problem List:  Active Hospital Problems   (*Primary Problem)    Diagnosis    *Fluid overload    COPD (chronic obstructive pulmonary disease)    CVA (cerebrovascular accident)     History      Depression    Metabolic encephalopathy    Alzheimer's disease, unspecified (CODE)    Diabetes mellitus, type 2     Chronic    Coronary artery disease    Hypothyroidism       Nutrition:    DIET DIABETIC Calorie amount: CC 1800; Do you want to initiate MNT Protocol? Yes; Restrict fluids to: 1200 ML    Additional clinical characteristics related to nutrition:    - monitor for weight changes   - monitor intake and output    - monitor bowel functions            Assessment/ Plan:   Fluid overload-resolved./acute diastolic heart failure-resolved.  Hypoglycemia  Metabolic encephalopathy  Chronic obstructive pulmonary disease  Acute hypercapnic respiratory failure  History of CVA  Dementia  Diabetes type 2-poorly controlled.  History of CAD  Hypothyroidism  Depression  Hyperkalemia-resolved  Urinary tract infection    -echocardiogram EF 65%.  moderate pulmonary pressure (RVSP 15 mmHg).  - hold  Lasix 40 mg daily.  -q.12 hours blood sugars  -urine culture Proteus mirabilis.  Continue Augmentin b.i.d..  -strict I&O  -continue ramelteon   -aspiration precaution  -avoid benzodiazepines  -DC Lokelma,   -  Tele endo following.  Lantus 5 units daily added.           Hospitalist personally evaluated and examined the patient in conjunction with the MLP and agree with the assessments, treatment plan and disposition of the patient as recorded by the [facility].     DVT/PE Prophylaxis: Apixaban  Discharge Planning:  Skilled nursing placement    Scot Cutter, APRN  [facility] Hospitalist

## 2023-11-20 NOTE — Care Plan (Signed)
 Problem: Adult Inpatient Plan of Care  Goal: Plan of Care Review  Outcome: Ongoing (see interventions/notes)  Goal: Patient-Specific Goal (Individualized)  Outcome: Ongoing (see interventions/notes)  Goal: Absence of Hospital-Acquired Illness or Injury  Outcome: Ongoing (see interventions/notes)  Intervention: Identify and Manage Fall Risk  Recent Flowsheet Documentation  Taken 11/19/2023 2100 by Birder Robson, RN  Safety Promotion/Fall Prevention:   activity supervised   fall prevention program maintained   nonskid shoes/slippers when out of bed   safety round/check completed  Intervention: Prevent Skin Injury  Recent Flowsheet Documentation  Taken 11/19/2023 2300 by Birder Robson, RN  Skin Protection: adhesive use limited  Taken 11/19/2023 2100 by Birder Robson, RN  Body Position: supine, head elevated  Intervention: Prevent and Manage VTE (Venous Thromboembolism) Risk  Recent Flowsheet Documentation  Taken 11/19/2023 2116 by Birder Robson, RN  VTE Prevention/Management: anticoagulant therapy maintained  Taken 11/19/2023 2100 by Birder Robson, RN  VTE Prevention/Management: anticoagulant therapy maintained  Taken 11/19/2023 2000 by Birder Robson, RN  VTE Prevention/Management: anticoagulant therapy maintained  Goal: Optimal Comfort and Wellbeing  Outcome: Ongoing (see interventions/notes)  Intervention: Provide Person-Centered Care  Recent Flowsheet Documentation  Taken 11/19/2023 2100 by Birder Robson, RN  Trust Relationship/Rapport:   care explained   choices provided   emotional support provided   empathic listening provided   questions answered   questions encouraged   reassurance provided   thoughts/feelings acknowledged  Goal: Rounds/Family Conference  Outcome: Ongoing (see interventions/notes)     Problem: Adult Inpatient Plan of Care  Goal: Optimal Comfort and Wellbeing  Outcome: Ongoing (see interventions/notes)  Intervention: Provide Person-Centered Care  Recent Flowsheet Documentation  Taken 11/19/2023 2100 by Birder Robson, RN  Trust  Relationship/Rapport:   care explained   choices provided   emotional support provided   empathic listening provided   questions answered   questions encouraged   reassurance provided   thoughts/feelings acknowledged     Problem: Health Knowledge, Opportunity to Enhance (Adult,Obstetrics,Pediatric)  Goal: Knowledgeable about Health Subject/Topic  Description: Patient will demonstrate the desired outcomes by discharge/transition of care.  Outcome: Ongoing (see interventions/notes)  Intervention: Enhance Health Knowledge  Recent Flowsheet Documentation  Taken 11/19/2023 2100 by Birder Robson, RN  Family/Support System Care: self-care encouraged     Problem: Fall Injury Risk  Goal: Absence of Fall and Fall-Related Injury  Outcome: Ongoing (see interventions/notes)  Intervention: Identify and Manage Contributors  Recent Flowsheet Documentation  Taken 11/19/2023 2100 by Birder Robson, RN  Medication Review/Management: medications reviewed  Intervention: Promote Injury-Free Environment  Recent Flowsheet Documentation  Taken 11/19/2023 2100 by Birder Robson, RN  Safety Promotion/Fall Prevention:   activity supervised   fall prevention program maintained   nonskid shoes/slippers when out of bed   safety round/check completed     Problem: Skin Injury Risk Increased  Goal: Skin Health and Integrity  Outcome: Ongoing (see interventions/notes)  Intervention: Optimize Skin Protection  Recent Flowsheet Documentation  Taken 11/19/2023 2300 by Birder Robson, RN  Pressure Reduction Techniques:   Moisture, shear and nutrition are maximized   Frequent weight shifting encouraged   Supplemented with small shifts  Pressure Reduction Devices: Repositioning wedges/pillows utilized  Skin Protection: adhesive use limited

## 2023-11-21 DIAGNOSIS — B964 Proteus (mirabilis) (morganii) as the cause of diseases classified elsewhere: Secondary | ICD-10-CM

## 2023-11-21 DIAGNOSIS — Z66 Do not resuscitate: Secondary | ICD-10-CM

## 2023-11-21 DIAGNOSIS — F0283 Dementia in other diseases classified elsewhere, unspecified severity, with mood disturbance: Secondary | ICD-10-CM

## 2023-11-21 LAB — CBC
HCT: 36.1 % (ref 31.2–41.9)
HGB: 11.8 g/dL (ref 10.9–14.3)
MCH: 31.1 pg (ref 24.7–32.8)
MCHC: 32.8 g/dL (ref 32.3–35.6)
MCV: 94.8 fL (ref 75.5–95.3)
MPV: 7 fL — ABNORMAL LOW (ref 7.9–10.8)
PLATELETS: 138 10*3/uL — ABNORMAL LOW (ref 140–440)
RBC: 3.8 10*6/uL (ref 3.63–4.92)
RDW: 15.3 % (ref 12.3–17.7)
WBC: 6.4 10*3/uL (ref 3.8–11.8)

## 2023-11-21 LAB — COMPREHENSIVE METABOLIC PANEL, NON-FASTING
ALBUMIN/GLOBULIN RATIO: 1.3 (ref 0.8–1.4)
ALBUMIN: 3.4 g/dL — ABNORMAL LOW (ref 3.5–5.7)
ALKALINE PHOSPHATASE: 58 U/L (ref 34–104)
ALT (SGPT): 61 U/L — ABNORMAL HIGH (ref 7–52)
ANION GAP: 5 mmol/L (ref 4–13)
AST (SGOT): 48 U/L — ABNORMAL HIGH (ref 13–39)
BILIRUBIN TOTAL: 0.9 mg/dL (ref 0.3–1.0)
BUN/CREA RATIO: 42 — ABNORMAL HIGH (ref 6–22)
BUN: 40 mg/dL — ABNORMAL HIGH (ref 7–25)
CALCIUM, CORRECTED: 9.5 mg/dL (ref 8.9–10.8)
CALCIUM: 9 mg/dL (ref 8.6–10.3)
CHLORIDE: 96 mmol/L — ABNORMAL LOW (ref 98–107)
CO2 TOTAL: 41 mmol/L (ref 21–31)
CREATININE: 0.96 mg/dL (ref 0.60–1.30)
ESTIMATED GFR: 59 mL/min/{1.73_m2} — ABNORMAL LOW (ref >59)
GLOBULIN: 2.6 (ref 2.0–3.5)
GLUCOSE: 159 mg/dL — ABNORMAL HIGH (ref 74–109)
OSMOLALITY, CALCULATED: 296 mosm/kg — ABNORMAL HIGH (ref 270–290)
POTASSIUM: 4 mmol/L (ref 3.5–5.1)
PROTEIN TOTAL: 6 g/dL — ABNORMAL LOW (ref 6.4–8.9)
SODIUM: 142 mmol/L (ref 136–145)

## 2023-11-21 LAB — BLOOD GAS W/ LACTATE REFLEX
%FIO2 (ARTERIAL): 28 %
%FIO2 (ARTERIAL): 35 %
BASE EXCESS (ARTERIAL): 18.1 mmol/L — ABNORMAL HIGH (ref 0.0–3.0)
BASE EXCESS (ARTERIAL): 13 mmol/L — ABNORMAL HIGH (ref 0.0–3.0)
BICARBONATE (ARTERIAL): 39.1 mmol/L — ABNORMAL HIGH (ref 21.0–28.0)
BICARBONATE (ARTERIAL): 35 mmol/L — ABNORMAL HIGH (ref 21.0–28.0)
LACTATE: 0.8 mmol/L (ref <=1.9)
LACTATE: 1 mmol/L (ref <=1.9)
O2 SATURATION (ARTERIAL): 97.7 % (ref 94.0–98.0)
O2 SATURATION (ARTERIAL): 95.2 % (ref 94.0–98.0)
PAO2/FIO2 RATIO: 350
PAO2/FIO2 RATIO: 217
PCO2 (ARTERIAL): 74 mmHg (ref 35–45)
PCO2 (ARTERIAL): 64 mmHg — ABNORMAL HIGH (ref 35–45)
PH (ARTERIAL): 7.41 (ref 7.35–7.45)
PH (ARTERIAL): 7.41 (ref 7.35–7.45)
PO2 (ARTERIAL): 98 mmHg (ref 83–108)
PO2 (ARTERIAL): 76 mmHg — ABNORMAL LOW (ref 83–108)

## 2023-11-21 LAB — POC BLOOD GLUCOSE (RESULTS)
GLUCOSE, POC: 145 mg/dL — ABNORMAL HIGH (ref 70–100)
GLUCOSE, POC: 179 mg/dL — ABNORMAL HIGH (ref 70–100)
GLUCOSE, POC: 176 mg/dL — ABNORMAL HIGH (ref 70–100)
GLUCOSE, POC: 253 mg/dL — ABNORMAL HIGH (ref 70–100)

## 2023-11-21 LAB — ADULT ROUTINE BLOOD CULTURE, SET OF 2 BOTTLES (BACTERIA AND YEAST): BLOOD CULTURE, ROUTINE: NO GROWTH

## 2023-11-21 LAB — MAGNESIUM: MAGNESIUM: 2.5 mg/dL (ref 1.9–2.7)

## 2023-11-21 MED ORDER — IPRATROPIUM 0.5 MG-ALBUTEROL 3 MG (2.5 MG BASE)/3 ML NEBULIZATION SOLN
3.0000 mL | INHALATION_SOLUTION | Freq: Four times a day (QID) | RESPIRATORY_TRACT | Status: DC
Start: 2023-11-21 — End: 2023-11-23
  Administered 2023-11-21 – 2023-11-23 (×9): 3 mL via RESPIRATORY_TRACT
  Administered 2023-11-23: 0 mL via RESPIRATORY_TRACT
  Administered 2023-11-23: 3 mL via RESPIRATORY_TRACT

## 2023-11-21 MED ORDER — METHYLPREDNISOLONE SOD SUCCINATE 40 MG/ML SOLUTION FOR INJ. WRAPPER
40.0000 mg | Freq: Two times a day (BID) | INTRAMUSCULAR | Status: DC
Start: 2023-11-21 — End: 2023-11-23
  Administered 2023-11-21 – 2023-11-23 (×5): 40 mg via INTRAVENOUS
  Filled 2023-11-21 (×5): qty 1

## 2023-11-21 NOTE — Respiratory Therapy (Signed)
 11/21/23 0331   Non-Invasive Ventilation Assessment   Start Time 0332   Orders Updated in EMR Yes   $ NIV Check S   NIV Status Found Off Bipap and On   $ O2 Delivery NC   Flow (L/min) (Oxygen Therapy) 3   Non-Invasive Ventilation Check   SpO2 100 %   Timepoint   Stop Time 0333     Patient off BiPAP.

## 2023-11-21 NOTE — Respiratory Therapy (Signed)
 11/21/23 2100   Aerosol Therapy (SVN)   Start Time 2104   Treatment Status Given   Route (Aerosol Therapy) mask   $ Respiratory Treatment (Resp only) Neb (Sub)   Medications DuoNeb (albuterol-atrovent)   Signs of Intolerance (SVN) none     Patient found on 2L nasal cannula. Breathing treatment administered via aerosol mask connected to 2nd flowmeter.

## 2023-11-21 NOTE — Progress Notes (Signed)
 [facility]  IP PROGRESS NOTE      Jordan Buckley, Jordan Buckley  Date of Admission:  11/16/2023  Date of Birth:  26-Mar-1941  Date of Service:  11/21/2023    Hospital Day:  LOS: 4 days       Subjective:   Pt seen and examined. She appears to be resting comfortably. Easily awakened and interactive. Appears confused. Family present at bedside. Pt appears to have eaten 80% breakfast. No reported n/v. No specific complaints other than generalized pain. Received dose of Norco around 8am. She was hypersomnolent this am with hypercapnia on ABG. BiPAP reordered. Family at bedside state pt has been asking for DC home and refusing BiPAP at times. Has been complaining of more generalized pain/discomfort.   Vital Signs:  Temp (24hrs) Max:36.8 C (98.2 F)      Temperature: 36.4 C (97.6 F)  BP (Non-Invasive): (!) 98/50  MAP (Non-Invasive): (!) 59 mmHG  Heart Rate: 94  Respiratory Rate: 18  SpO2: 94 %    Current Medications:  apixaban (ELIQUIS) tablet, 5 mg, Oral, 2x/day  atorvastatin (LIPITOR) tablet, 20 mg, Oral, NIGHTLY  bisacodyl (DULCOLAX) enteric coated tablet, 5 mg, Oral, Q72H PRN  busPIRone (BUSPAR) tablet, 5 mg, Oral, Daily  Correction/SSIP insulin lispro 100 units/mL injection, 2-9 Units, Subcutaneous, 3x/day AC  dextromethorphan-guaiFENesin (ROBITUSSIN DM) 10-100mg  per 5mL oral liquid, 10 mL, Oral, Q4H PRN  dextrose (GLUTOSE) 40% oral gel, 15 g, Oral, Q15 Min PRN  dextrose 50% (0.5 g/mL) injection - syringe, 12.5 g, Intravenous, Q15 Min PRN  docusate sodium (COLACE) capsule, 100 mg, Oral, Daily  escitalopram (LEXAPRO) tablet, 5 mg, Oral, Daily  ferrous sulfate 324 mg (65 mg elemental IRON) tablet, 324 mg, Oral, Daily  [Held by provider] furosemide (LASIX) tablet, 20 mg, Oral, Daily  glucagon injection 1 mg, 1 mg, IntraMUSCULAR, Once PRN  insulin glargine 100 units/mL injection, 5 Units, Subcutaneous, Daily  insulin lispro 100 units/mL injection, 3 Units, Subcutaneous, 3x/day AC  ipratropium-albuterol 0.5 mg-3  mg(2.5 mg base)/3 mL Solution for Nebulization, 3 mL, Nebulization, Q4H PRN  ipratropium-albuterol 0.5 mg-3 mg(2.5 mg base)/3 mL Solution for Nebulization, 3 mL, Nebulization, 4x/day  levothyroxine (SYNTHROID) tablet, 75 mcg, Oral, QAM  loperamide (IMODIUM) capsule, 2 mg, Oral, Once PRN  magnesium hydroxide (MILK OF MAGNESIA) 400mg  per 5mL oral liquid, 30 mL, Oral, Daily PRN  magnesium oxide (MAG-OX) 400mg  (241.3 mg elemental magnesium) tablet, 400 mg, Oral, 3x/day  methylPREDNISolone sod succ (SOLU-medrol) 40 mg/mL injection, 40 mg, Intravenous, Q12H  metoprolol tartrate (LOPRESSOR) tablet, 12.5 mg, Oral, 2x/day  nystatin (NYSTOP) 100,000 units/g topical powder, , Apply Topically, 2x/day  perflutren lipid microspheres (DEFINITY) 1.3 mL in NS 10 mL (tot vol) injection, 2 mL, Intravenous, Cardiology Once PRN  pramipexole (MIRAPEX) tablet, 0.125 mg, Oral, 3x/day  pyridOXINE Vitamin B6 tablet, 250 mg, Oral, 2x/day  ramelteon (ROZEREM) tablet, 8 mg, Oral, NIGHTLY  zinc oxide 56.7 g, clotrimazole (LOTRIMIN) 15 g, hydrocortisone (CORTIZONE-10) 28 g compounded topical cream, , Apply Topically, Q30 Min PRN        Current Orders:  Active Orders   Lab    CBC     Frequency: ONE TIME     Number of Occurrences: 1 Occurrences    COMPREHENSIVE METABOLIC PANEL, NON-FASTING     Frequency: ONE TIME     Number of Occurrences: 1 Occurrences   Diet    DIET DIABETIC Calorie amount: CC 1800; Do you want to initiate MNT Protocol? Yes; Restrict fluids to: 1200 ML  Frequency: All Meals     Number of Occurrences: 1 Occurrences   Nursing    ACTIVITY     Frequency: UNTIL DISCONTINUED     Number of Occurrences: Until Specified    APIXABAN NURSING ORDER     Frequency: UNTIL DISCONTINUED     Number of Occurrences: Until Specified     Order Comments: Nursing Instructions:    1.  Print apixaban (Eliquis) guide using the link in this order.   2.  Nurse to provide apixaban patient guide to all patients and be sure that all new starts have received  education by the trained anticoagulation educator.  If additional questions, contact pharmacy.           APPLY SEQUENTIAL COMPRESSION DEVICE     Frequency: ONE TIME     Number of Occurrences: 1 Occurrences    HYPOGLYCEMIA MANAGEMENT - CONSCIOUS PATIENT W/DIET ORDER     Frequency: UNTIL DISCONTINUED     Number of Occurrences: Until Specified    HYPOGLYCEMIA MANAGEMENT - UNCONSCIOUS/ALTERED/NPO PATIENT     Frequency: UNTIL DISCONTINUED     Number of Occurrences: Until Specified    HYPOGLYCEMIA TREATMENT ALGORITHM     Frequency: UNTIL DISCONTINUED     Number of Occurrences: Until Specified    INCENTIVE SPIROMETRY NURSING     Frequency: Q1H WA     Number of Occurrences: 250 Occurrences    INTAKE AND OUTPUT Q4H     Frequency: Q4H     Number of Occurrences: Until Specified    MAINTAIN SEQUENTIAL COMPRESSION DEVICE     Frequency: CONTINUOUS     Number of Occurrences: Until Specified    NOTIFY MD     Frequency: PRN     Number of Occurrences: Until Specified    Notify MD Vital Signs     Frequency: PRN     Number of Occurrences: Until Specified    ORAL REHYDRATION FLUIDS     Frequency: UNTIL DISCONTINUED     Number of Occurrences: Until Specified    PT IS INTERMEDIATE RISK FOR VENOUS THROMBOEMBOLISM     Frequency: CONTINUOUS     Number of Occurrences: Until Specified    PULSE OXIMETRY Q4H     Frequency: Q4H     Number of Occurrences: Until Specified    TELEMETRY MONITORING - Continuous     Frequency: CONTINUOUS     Number of Occurrences: Until Specified    VITAL SIGNS  Q4H     Frequency: Q4H     Number of Occurrences: Until Specified    WAS PATIENT ON APIXABAN PRIOR TO ADMISSION?     Frequency: UNTIL DISCONTINUED     Number of Occurrences: Until Specified   Code Status    DO NOT ATTEMPT RESUSCITATION (DNAR/DNI/DNR-CC)     Frequency: CONTINUOUS     Number of Occurrences: Until Specified     Order Comments: Do not use mechanical ventilation or intubation in the event of respiratory failure or other medication indication. May  consider use of less invasive airway support such as BiPAP.    In the event of a pulseless cardiac arrest, do NOT perform ACLS (advanced cardiac life support) to attempt resuscitation. IE - DO NOT perform chest compressions/defibrillation or use mechanical ventilation or intubation in the setting of pulseless cardiac arrest.       Consult    IP CONSULT TO ENDOCRINE/METABOLIC - TELEMEDICINE     Frequency: ONE TIME     Number of Occurrences: 1 Occurrences  IP CONSULT TO PALLIATIVE CARE     Frequency: ONE TIME     Number of Occurrences: 1 Occurrences    IP CONSULT TO PALLIATIVE CARE     Frequency: ONE TIME     Number of Occurrences: 1 Occurrences   Respiratory Care    ACCUPAP SYSTEM     Frequency: Q6H while awake     Number of Occurrences: Until Specified    INCENTIVE SPIROMETRY - RT INSTRUCT     Frequency: ONE TIME     Number of Occurrences: 1 Occurrences    NONINVASIVE POSITIVE PRESSURE VENTILATION PROTOCOL (RT TO DETERMINE SETTINGS)     Frequency: Q4H     Number of Occurrences: Until Specified   Point of Care Testing    PERFORM POC WHOLE BLOOD GLUCOSE     Frequency: TID AC & HS     Number of Occurrences: Until Specified   Medications    apixaban (ELIQUIS) tablet     Frequency: 2x/day     Dose: 5 mg     Route: Oral    atorvastatin (LIPITOR) tablet     Frequency: NIGHTLY     Dose: 20 mg     Route: Oral    bisacodyl (DULCOLAX) enteric coated tablet     Frequency: Q72H PRN     Dose: 5 mg     Route: Oral    busPIRone (BUSPAR) tablet     Frequency: Daily     Dose: 5 mg     Route: Oral    Correction/SSIP insulin lispro 100 units/mL injection     Frequency: 3x/day AC     Dose: 2-9 Units     Route: Subcutaneous    dextromethorphan-guaiFENesin (ROBITUSSIN DM) 10-100mg  per 5mL oral liquid     Frequency: Q4H PRN     Dose: 10 mL     Route: Oral    dextrose (GLUTOSE) 40% oral gel     Frequency: Q15 Min PRN     Dose: 15 g     Route: Oral    dextrose 50% (0.5 g/mL) injection - syringe     Frequency: Q15 Min PRN     Dose: 12.5 g      Route: Intravenous    docusate sodium (COLACE) capsule     Frequency: Daily     Dose: 100 mg     Route: Oral    escitalopram (LEXAPRO) tablet     Frequency: Daily     Dose: 5 mg     Route: Oral    ferrous sulfate 324 mg (65 mg elemental IRON) tablet     Frequency: Daily     Dose: 324 mg     Route: Oral    furosemide (LASIX) tablet     Frequency: Daily     Dose: 20 mg     Route: Oral    glucagon injection 1 mg     Frequency: Once PRN     Dose: 1 mg     Route: IntraMUSCULAR    insulin glargine 100 units/mL injection     Frequency: Daily     Dose: 5 Units     Route: Subcutaneous    insulin lispro 100 units/mL injection     Frequency: 3x/day AC     Dose: 3 Units     Route: Subcutaneous    ipratropium-albuterol 0.5 mg-3 mg(2.5 mg base)/3 mL Solution for Nebulization     Frequency: Q4H PRN     Dose: 3 mL  Route: Nebulization    ipratropium-albuterol 0.5 mg-3 mg(2.5 mg base)/3 mL Solution for Nebulization     Frequency: 4x/day     Dose: 3 mL     Route: Nebulization    levothyroxine (SYNTHROID) tablet     Frequency: QAM     Dose: 75 mcg     Route: Oral    loperamide (IMODIUM) capsule     Frequency: Once PRN     Dose: 2 mg     Route: Oral    magnesium hydroxide (MILK OF MAGNESIA) 400mg  per 5mL oral liquid     Frequency: Daily PRN     Dose: 30 mL     Route: Oral    magnesium oxide (MAG-OX) 400mg  (241.3 mg elemental magnesium) tablet     Frequency: 3x/day     Dose: 400 mg     Route: Oral    methylPREDNISolone sod succ (SOLU-medrol) 40 mg/mL injection     Frequency: Q12H     Dose: 40 mg     Route: Intravenous    metoprolol tartrate (LOPRESSOR) tablet     Frequency: 2x/day     Dose: 12.5 mg     Route: Oral    nystatin (NYSTOP) 100,000 units/g topical powder     Frequency: 2x/day     Route: Apply Topically    perflutren lipid microspheres (DEFINITY) 1.3 mL in NS 10 mL (tot vol) injection     Frequency: Cardiology Once PRN     Dose: 2 mL     Route: Intravenous    pramipexole (MIRAPEX) tablet     Frequency: 3x/day     Dose:  0.125 mg     Route: Oral    pyridOXINE Vitamin B6 tablet     Frequency: 2x/day     Dose: 250 mg     Route: Oral    ramelteon (ROZEREM) tablet     Frequency: NIGHTLY     Dose: 8 mg     Route: Oral    zinc oxide 56.7 g, clotrimazole (LOTRIMIN) 15 g, hydrocortisone (CORTIZONE-10) 28 g compounded topical cream     Frequency: Q30 Min PRN     Route: Apply Topically        Review of Systems:  Limited by pt confusion    Today's Physical Exam:  Physical Exam  Constitutional:       General: She is not in acute distress.     Comments: Chronically ill  Drowsy but arousable  Will interact but is hard of hearing and confused   Eyes:      Pupils: Pupils are equal, round, and reactive to light.   Cardiovascular:      Rate and Rhythm: Normal rate and regular rhythm.   Pulmonary:      Effort: Pulmonary effort is normal. No respiratory distress.      Breath sounds: Normal breath sounds. No wheezing.   Abdominal:      General: Bowel sounds are normal. There is no distension.      Tenderness: There is no abdominal tenderness.   Musculoskeletal:      Right lower leg: No edema.      Left lower leg: No edema.   Skin:     General: Skin is warm and dry.   Neurological:      Mental Status: She is disoriented.              I/O:  I/O last 24 hours:    Intake/Output Summary (Last 24 hours) at 11/21/2023  1245  Last data filed at 11/21/2023 4034  Gross per 24 hour   Intake 327 ml   Output --   Net 327 ml     I/O current shift:  No intake/output data recorded.      Labs  Please indicate ordered or reviewed)  Reviewed: I have reviewed all lab results.  Lab Results Today:    Results for orders placed or performed during the hospital encounter of 11/16/23 (from the past 24 hours)   BASIC METABOLIC PANEL   Result Value Ref Range    SODIUM 135 (L) 136 - 145 mmol/L    POTASSIUM 5.5 (H) 3.5 - 5.1 mmol/L    CHLORIDE 92 (L) 98 - 107 mmol/L    CO2 TOTAL 33 (H) 21 - 31 mmol/L    ANION GAP 10 4 - 13 mmol/L    CALCIUM 8.3 (L) 8.6 - 10.3 mg/dL    GLUCOSE 742 (H)  74 - 109 mg/dL    BUN 45 (H) 7 - 25 mg/dL    CREATININE 5.95 6.38 - 1.30 mg/dL    BUN/CREA RATIO 39 (H) 6 - 22    ESTIMATED GFR 48 (L) >59 mL/min/1.65m^2    OSMOLALITY, CALCULATED 297 (H) 270 - 290 mOsm/kg   POC BLOOD GLUCOSE (RESULTS)   Result Value Ref Range    GLUCOSE, POC 358 (H) 70 - 100 mg/dl   POC BLOOD GLUCOSE (RESULTS)   Result Value Ref Range    GLUCOSE, POC 202 (H) 70 - 100 mg/dl   CBC   Result Value Ref Range    WBC 6.4 3.8 - 11.8 x10^3/uL    RBC 3.80 3.63 - 4.92 x10^6/uL    HGB 11.8 10.9 - 14.3 g/dL    HCT 75.6 43.3 - 29.5 %    MCV 94.8 75.5 - 95.3 fL    MCH 31.1 24.7 - 32.8 pg    MCHC 32.8 32.3 - 35.6 g/dL    RDW 18.8 41.6 - 60.6 %    PLATELETS 138 (L) 140 - 440 x10^3/uL    MPV 7.0 (L) 7.9 - 10.8 fL   COMPREHENSIVE METABOLIC PANEL, NON-FASTING   Result Value Ref Range    SODIUM 142 136 - 145 mmol/L    POTASSIUM 4.0 3.5 - 5.1 mmol/L    CHLORIDE 96 (L) 98 - 107 mmol/L    CO2 TOTAL 41 (HH) 21 - 31 mmol/L    ANION GAP 5 4 - 13 mmol/L    BUN 40 (H) 7 - 25 mg/dL    CREATININE 3.01 6.01 - 1.30 mg/dL    BUN/CREA RATIO 42 (H) 6 - 22    ESTIMATED GFR 59 (L) >59 mL/min/1.75m^2    ALBUMIN 3.4 (L) 3.5 - 5.7 g/dL    CALCIUM 9.0 8.6 - 09.3 mg/dL    GLUCOSE 235 (H) 74 - 109 mg/dL    ALKALINE PHOSPHATASE 58 34 - 104 U/L    ALT (SGPT) 61 (H) 7 - 52 U/L    AST (SGOT) 48 (H) 13 - 39 U/L    BILIRUBIN TOTAL 0.9 0.3 - 1.0 mg/dL    PROTEIN TOTAL 6.0 (L) 6.4 - 8.9 g/dL    ALBUMIN/GLOBULIN RATIO 1.3 0.8 - 1.4    OSMOLALITY, CALCULATED 296 (H) 270 - 290 mOsm/kg    CALCIUM, CORRECTED 9.5 8.9 - 10.8 mg/dL    GLOBULIN 2.6 2.0 - 3.5   MAGNESIUM - AM ONCE   Result Value Ref Range    MAGNESIUM 2.5 1.9 -  2.7 mg/dL   POC BLOOD GLUCOSE (RESULTS)   Result Value Ref Range    GLUCOSE, POC 145 (H) 70 - 100 mg/dl   BLOOD GAS W/ LACTATE REFLEX Arterial   Result Value Ref Range    %FIO2 (ARTERIAL) 28 %    PH (ARTERIAL) 7.41 7.35 - 7.45    PCO2 (ARTERIAL) 74 (HH) 35 - 45 mm/Hg    PO2 (ARTERIAL) 98 83 - 108 mm/Hg    BICARBONATE (ARTERIAL) 39.1  (H) 21.0 - 28.0 mmol/L    BASE EXCESS (ARTERIAL) 18.1 (H) 0.0 - 3.0 mmol/L    PAO2/FIO2 RATIO 350     O2 SATURATION (ARTERIAL) 97.7 94.0 - 98.0 %    LACTATE 0.8 <=1.9 mmol/L   POC BLOOD GLUCOSE (RESULTS)   Result Value Ref Range    GLUCOSE, POC 179 (H) 70 - 100 mg/dl           Problem List:  Active Hospital Problems   (*Primary Problem)    Diagnosis    *Fluid overload    Moderate pulmonary hypertension (CMS HCC)    Bacteriuria    Systolic heart failure, unspecified HF chronicity (CMS HCC)    COPD (chronic obstructive pulmonary disease)    CVA (cerebrovascular accident)     History      Depression    Metabolic encephalopathy    Alzheimer's disease, unspecified (CODE)    Diabetes mellitus, type 2     Chronic    Coronary artery disease    Hypothyroidism       Nutrition:    DIET DIABETIC Calorie amount: CC 1800; Do you want to initiate MNT Protocol? Yes; Restrict fluids to: 1200 ML    Additional clinical characteristics related to nutrition:    - monitor for weight changes   - monitor intake and output    - monitor bowel functions                  Assessment/ Plan:   Fluid overload-resolved./acute diastolic heart failure-resolved.  Hypoglycemia  Metabolic encephalopathy  Chronic obstructive pulmonary disease  Acute hypercapnic respiratory failure  History of CVA  Dementia  Diabetes type 2-poorly controlled.  History of CAD  Hypothyroidism  Depression  Hyperkalemia-resolved  Urinary tract infection-proteus mirabilis    11/21/23-pt received dose of fosfomycin x 1 per ID recommendations for UTI. Has hx recurrent pna and is now admitted for acute respiratory failure, fluid overload. Has chronic dementia but is able to interact with her family at times to discuss goals of care. Family met with palliative care yesterday and was interested in services, however, after consideration, they do not feel palliative care will provide any additional benefits that she is not currently receiving at SNF. Mpoa is present during exam and  states pt has repeatedly indicated she is tired and wants to go home (indicating snf). They acknowledge chronic medical illnesses and their desire to offer quality of life to the pt and would like to pursue hospice at snf. Pt is DNR/DNI. Pt is not CMO. Family understands that medical treatment will continue until hospice arrangements are made at SNF. As such, will avoid sedating meds and encourage compliance with BiPAP as tolerated. Continue medical tx as otherwise outlined in orders.    Further recommendations depending on clinical course.  The Hospitalist personally evaluated and examined the patient in conjunction with the MLP and agree with the assessments, treatment plan and disposition of the patient as recorded by the [facility]    Leodis Rainwater,  MSPA, [facility]    DVT/PE Prophylaxis: Apixaban

## 2023-11-21 NOTE — Nurses Notes (Signed)
 01:30 Patient took BiPAP off. 3L NC put back on.

## 2023-11-21 NOTE — Respiratory Therapy (Signed)
 11/21/23 1300   Non-Invasive Ventilation Check   Type of Mask FFM   NG/OG Placed No   Facial Skin Integrity WNL   Device V-60    Mode S/T   Spontaneous Volume 464 mL   Set Rate 12   Spontaneous Rate 16 Breaths Per Minute   Minute Volume 6.8 LPM   FiO2 Set 35%   IPAP 18 cmH20   EPAP 6 cmH2O   PIP 18 cmH2O   Rise Time % 3   I-Time 0.9 seconds   SpO2 90 %     Pt came off BiPAP for about 30 minutes so she could eat.  Placed back on at 1300.  Will try and leave on for a few hours and get a repeat ABG.  Care is ongoing.

## 2023-11-21 NOTE — Respiratory Therapy (Signed)
 11/21/23 2251   Non-Invasive Ventilation Check   Type of Mask FFM   NG/OG Placed No   Facial Skin Integrity WNL   Device V-60    Mode NIV-ST   Spontaneous Volume 462 mL   Set Rate 12   Spontaneous Rate 8 Breaths Per Minute   Minute Volume 8.8 LPM   FiO2 Set 35%   IPAP 18 cmH20   EPAP 6 cmH2O   PIP 18 cmH2O   I-Time 0.9 seconds   SpO2 93 %     Placed patient on BiPAP at this time.

## 2023-11-21 NOTE — Respiratory Therapy (Signed)
 Latest Reference Range & Units 11/21/23 16:44   %FIO2 (ARTERIAL) % 35   PH 7.35 - 7.45  7.41   PCO2 35 - 45 mm/Hg 64 (H)   PO2 83 - 108 mm/Hg 76 (L)   BICARBONATE 21.0 - 28.0 mmol/L 35.0 (H)   BASE EXCESS 0.0 - 3.0 mmol/L 13.0 (H)   PAO2/FIO2 RATIO  217   O2 SATURATION (ARTERIAL) 94.0 - 98.0 % 95.2   LACTATE <=1.9 mmol/L 1.0   (H): Data is abnormally high  (L): Data is abnormally low    ABG results given to Stephens Eis, RN.  Pt taken off BiPAP and placed on 2L NC.

## 2023-11-21 NOTE — Nurses Notes (Signed)
 BiPAP on pt at this time.

## 2023-11-21 NOTE — Respiratory Therapy (Signed)
 Latest Reference Range & Units 11/21/23 08:56   %FIO2 (ARTERIAL) % 28   PH 7.35 - 7.45  7.41   PCO2 35 - 45 mm/Hg 74 (HH)   PO2 83 - 108 mm/Hg 98   BICARBONATE 21.0 - 28.0 mmol/L 39.1 (H)   BASE EXCESS 0.0 - 3.0 mmol/L 18.1 (H)   PAO2/FIO2 RATIO  350   O2 SATURATION (ARTERIAL) 94.0 - 98.0 % 97.7   LACTATE <=1.9 mmol/L 0.8   (HH): Data is critically high  (H): Data is abnormally high    ABG results relayed to Dr Hilton Lucky.  Will place her on BiPAP.

## 2023-11-21 NOTE — Nurses Notes (Signed)
 Dr. Hilton Lucky wants ABG repeated at 1700 today. Pt is currently on BiPAP and plans to send pt back to [facility] on BiPAP. I talked to [place] at [facility] and they can not have a BiPAP set up on the weekends. Dr. Hilton Lucky notified.   Jordan Barry NP had discussed with MPOA about hospice at the nursing home. The family wants hospice [facility] at [facility].

## 2023-11-21 NOTE — Respiratory Therapy (Signed)
 11/21/23 1115   Non-Invasive Ventilation Check   Type of Mask FFM   NG/OG Placed No   Facial Skin Integrity WNL   Device V-60    Mode S/T   Spontaneous Volume 501 mL   Set Rate 12   Spontaneous Rate 18 Breaths Per Minute   Minute Volume 8.6 LPM   FiO2 Set 30%   IPAP 18 cmH20   EPAP 6 cmH2O   PIP 17 cmH2O   Rise Time % 3   I-Time 0.9 seconds   SpO2 94 %     Placed on BiPAP after pt finished eating.  Pt does not want heater on.  No redness or breakdown noticed on skin.  Will continue to monitor.

## 2023-11-22 LAB — COMPREHENSIVE METABOLIC PANEL, NON-FASTING
ALBUMIN/GLOBULIN RATIO: 1.3 (ref 0.8–1.4)
ALBUMIN: 3.7 g/dL (ref 3.5–5.7)
ALKALINE PHOSPHATASE: 61 U/L (ref 34–104)
ALT (SGPT): 62 U/L — ABNORMAL HIGH (ref 7–52)
ANION GAP: 10 mmol/L (ref 4–13)
AST (SGOT): 46 U/L — ABNORMAL HIGH (ref 13–39)
BILIRUBIN TOTAL: 1.1 mg/dL — ABNORMAL HIGH (ref 0.3–1.0)
BUN/CREA RATIO: 39 — ABNORMAL HIGH (ref 6–22)
BUN: 42 mg/dL — ABNORMAL HIGH (ref 7–25)
CALCIUM, CORRECTED: 9 mg/dL (ref 8.9–10.8)
CALCIUM: 8.8 mg/dL (ref 8.6–10.3)
CHLORIDE: 93 mmol/L — ABNORMAL LOW (ref 98–107)
CO2 TOTAL: 32 mmol/L — ABNORMAL HIGH (ref 21–31)
CREATININE: 1.08 mg/dL (ref 0.60–1.30)
ESTIMATED GFR: 51 mL/min/{1.73_m2} — ABNORMAL LOW (ref >59)
GLOBULIN: 2.9 (ref 2.0–3.5)
GLUCOSE: 357 mg/dL — ABNORMAL HIGH (ref 74–109)
OSMOLALITY, CALCULATED: 295 mosm/kg — ABNORMAL HIGH (ref 270–290)
POTASSIUM: 4.7 mmol/L (ref 3.5–5.1)
PROTEIN TOTAL: 6.6 g/dL (ref 6.4–8.9)
SODIUM: 135 mmol/L — ABNORMAL LOW (ref 136–145)

## 2023-11-22 LAB — CBC
HCT: 39.5 % (ref 31.2–41.9)
HGB: 12.7 g/dL (ref 10.9–14.3)
MCH: 30.9 pg (ref 24.7–32.8)
MCHC: 32.1 g/dL — ABNORMAL LOW (ref 32.3–35.6)
MCV: 96.5 fL — ABNORMAL HIGH (ref 75.5–95.3)
MPV: 6.9 fL — ABNORMAL LOW (ref 7.9–10.8)
PLATELETS: 132 10*3/uL — ABNORMAL LOW (ref 140–440)
RBC: 4.09 10*6/uL (ref 3.63–4.92)
RDW: 15.5 % (ref 12.3–17.7)
WBC: 6.8 10*3/uL (ref 3.8–11.8)

## 2023-11-22 LAB — POC BLOOD GLUCOSE (RESULTS)
GLUCOSE, POC: 312 mg/dL — ABNORMAL HIGH (ref 70–100)
GLUCOSE, POC: 382 mg/dL — ABNORMAL HIGH (ref 70–100)
GLUCOSE, POC: 335 mg/dL — ABNORMAL HIGH (ref 70–100)
GLUCOSE, POC: 373 mg/dL — ABNORMAL HIGH (ref 70–100)

## 2023-11-22 LAB — ADULT ROUTINE BLOOD CULTURE, SET OF 2 BOTTLES (BACTERIA AND YEAST): BLOOD CULTURE, ROUTINE: NO GROWTH

## 2023-11-22 MED ORDER — INSULIN LISPRO 100 UNIT/ML SUB-Q SSIP VIAL
1.0000 [IU] | INJECTION | Freq: Every evening | SUBCUTANEOUS | Status: DC
Start: 2023-11-22 — End: 2023-11-22

## 2023-11-22 MED ORDER — INSULIN LISPRO 100 UNIT/ML SUB-Q - CHARGE BY DOSE
6.0000 [IU] | Freq: Three times a day (TID) | SUBCUTANEOUS | Status: DC
Start: 2023-11-22 — End: 2023-11-22
  Administered 2023-11-22 (×2): 6 [IU] via SUBCUTANEOUS
  Filled 2023-11-22 (×2): qty 6

## 2023-11-22 MED ORDER — INSULIN GLARGINE 100 UNITS/ML SUBQ - CHARGE BY DOSE
10.0000 [IU] | Freq: Every day | SUBCUTANEOUS | Status: DC
Start: 2023-11-23 — End: 2023-11-23

## 2023-11-22 MED ORDER — PANTOPRAZOLE 40 MG TABLET,DELAYED RELEASE
40.0000 mg | DELAYED_RELEASE_TABLET | Freq: Every day | ORAL | Status: DC
Start: 2023-11-22 — End: 2023-11-23
  Administered 2023-11-22 – 2023-11-23 (×2): 40 mg via ORAL
  Filled 2023-11-22 (×2): qty 1

## 2023-11-22 MED ORDER — INSULIN LISPRO 100 UNIT/ML SUB-Q - CHARGE BY DOSE
8.0000 [IU] | Freq: Three times a day (TID) | SUBCUTANEOUS | Status: DC
Start: 2023-11-23 — End: 2023-11-23
  Administered 2023-11-23: 0 [IU] via SUBCUTANEOUS

## 2023-11-22 MED ORDER — INSULIN GLARGINE 100 UNITS/ML SUBQ - CHARGE BY DOSE
8.0000 [IU] | Freq: Every day | SUBCUTANEOUS | Status: DC
Start: 2023-11-22 — End: 2023-11-22
  Administered 2023-11-22: 8 [IU] via SUBCUTANEOUS

## 2023-11-22 MED ORDER — INSULIN LISPRO 100 UNIT/ML SUB-Q SSIP VIAL
2.0000 [IU] | INJECTION | SUBCUTANEOUS | Status: DC
Start: 2023-11-22 — End: 2023-11-23
  Administered 2023-11-22: 9 [IU] via SUBCUTANEOUS
  Administered 2023-11-23: 3 [IU] via SUBCUTANEOUS
  Administered 2023-11-23: 0 [IU] via SUBCUTANEOUS
  Filled 2023-11-22: qty 3
  Filled 2023-11-22: qty 9

## 2023-11-22 MED ORDER — GABAPENTIN 100 MG CAPSULE
100.0000 mg | ORAL_CAPSULE | Freq: Every evening | ORAL | Status: DC
Start: 2023-11-22 — End: 2023-11-23
  Administered 2023-11-22: 100 mg via ORAL
  Filled 2023-11-22: qty 1

## 2023-11-22 MED ORDER — LORATADINE 10 MG TABLET
10.0000 mg | ORAL_TABLET | Freq: Every day | ORAL | Status: DC
Start: 2023-11-22 — End: 2023-11-23
  Administered 2023-11-22 – 2023-11-23 (×2): 10 mg via ORAL
  Filled 2023-11-22 (×2): qty 1

## 2023-11-22 MED ORDER — BUDESONIDE-FORMOTEROL HFA 80 MCG-4.5 MCG/ACTUATION AEROSOL INHALER
1.0000 | INHALATION_SPRAY | Freq: Every day | RESPIRATORY_TRACT | Status: DC
Start: 2023-11-22 — End: 2023-11-23

## 2023-11-22 NOTE — Respiratory Therapy (Signed)
 11/22/23 0227   Non-Invasive Ventilation Check   Type of Mask FFM   NG/OG Placed No   Facial Skin Integrity WNL   Device V-60    Mode NIV-ST   Spontaneous Volume 440 mL   Set Rate 12   Spontaneous Rate 1 Breaths Per Minute   Minute Volume 6 LPM   FiO2 Set 35%   IPAP 18 cmH20   EPAP 6 cmH2O   PIP 17 cmH2O   Rise Time % 3   I-Time 39 seconds   SpO2 92 %     Patient remains on bipap

## 2023-11-22 NOTE — Telemedicine Consult (Addendum)
 Brief endocrine recommendations     Jordan Buckley is a 83 y.o. female with PMH of type 2 diabetes, chronic obstructive pulmonary disease, CAD, CVA, dementia, hypertension, has been admitted to [facility] for for fluid overload and pneumonia. Endocrinology is consulted for management of diabetes.     Uncontrolled type 2 diabetes mellitus with hyperglycemia - improving  Lab Results   Component Value Date    HA1C 11.2 (H) 11/17/2023     -Home regimen:  Lantus and lispro (doses unknown)  -Current regimen:  Lispro SSI sensitive scale       Diet Orders   (From admission, onward)                 Start     Ordered    11/17/23 0145  DIET DIABETIC Calorie amount: CC 1800; Do you want to initiate MNT Protocol? Yes; Restrict fluids to: 1200 ML  ALL MEALS        Duration: 1 Occurrences   Priority: Routine      Process Instructions: Nursing may place [id number] Nurse to Dietary Tech as a secondary order mode to communicate any unique dietary needs.       Do NOT use this order for requesting a Consult to the Dietitian. Please use     "IP CONSULT TO NUTRITION SERVICES" instead.       MNT Protocol allows the Registered/Licensed Dietitian to place active orders that initiate nutritional interventions (enteral feedings, diet consistency changes or modifications, vitamin/mineral supplementation or other nutri tion care related orders) within their scope of practice.           Question Answer Comment   Calorie amount: CC 1800    Do you want to initiate MNT Protocol? Yes    Restrict fluids to: 1200 ML        11/17/23 0140                   Steroid dose doubled, now on methylpred 40 mg twice daily, increase Lantus and mealtime dose cautiously accounting for resp status and poor oral intake    Recommendations:   -Continue Lantus 8 units daily   -Continue lispro 6 units with meals t.i.d.   -Continue sensitive scale SSI with meals, conservative scale at bedtime     Recommendations discussed with primary team, PRN HOSPITALIST  1.      Thank you for this consult. We will continue to follow. Please page with questions.      Addendum:  Glucose severely elevated, increased Lantus to 10 units, lispro to 8 units with meals, lispro scale to sensitive every 4 hours, these will need to be adjusted if steroid dose is adjusted

## 2023-11-22 NOTE — Respiratory Therapy (Signed)
 11/22/23 2049   Non-Invasive Ventilation Assessment   Start Time 2051   Orders Updated in EMR Yes   $ NIV Check S   NIV Status Patient Refused    $ O2 Delivery NC   Flow (L/min) (Oxygen Therapy) 3     Attempted to place patient on BiPAP, patient requested to take BiPAP mask off and refused to put it back on.

## 2023-11-22 NOTE — Care Plan (Signed)
 Patient admitted with fluid overload. Patient resting in bed with call bell in reach. Fall prevention protocol in place, bed alarm on. Incontinence care provided. Labs and vitals being monitored.   Problem: Adult Inpatient Plan of Care  Goal: Patient-Specific Goal (Individualized)  Outcome: Ongoing (see interventions/notes)  Flowsheets (Taken 11/22/2023 2200)  Individualized Care Needs: monitor labs and vitals  Anxieties, Fears or Concerns: none voiced at this time

## 2023-11-22 NOTE — Telemedicine Consult (Signed)
 Endocrinology Telemedicine Consult Follow-Up Note      Telemedicine Documentation:  Patient Location: [facility]  Patient/family aware of provider location: yes  Patient/family consent for telemedicine: yes  Examination observed and performed by: Lael Pierce, MD  Provider's Physical Location: [facility]    Today's Date:       11/23/2023  Encounter Start Date:      11/16/2023  Inpatient Admission Date:  11/17/2023  Name:             Jordan Buckley  Age:             83 y.o.  Attending:             Arnett Bi, DO  PCP             Edna Gouty, PA-C      Reason for Consult: Type II Diabetes Mellitus  Chief Complaint:  Fluid overload, Pneumonia    Subjective: Patient sleepy this morning. On oxygen via nasal cannula.     Current Facility-Administered Medications   Medication Dose Route Frequency Provider Last Rate Last Admin    apixaban (ELIQUIS) tablet  5 mg Oral 2x/day Pamella Boer, FNP-BC   5 mg at 11/22/23 2118    atorvastatin (LIPITOR) tablet  20 mg Oral NIGHTLY Pamella Boer, FNP-BC   20 mg at 11/22/23 2118    bisacodyl (DULCOLAX) enteric coated tablet  5 mg Oral Q72H PRN Rhetta Cellar, MD        [Held by provider] budesonide-formoterol (SYMBICORT) 80 mcg-4.5 mcg per inhalation oral inhaler - "Respiratory to administer"  1 Puff Inhalation Daily Arnett Bi, DO        busPIRone (BUSPAR) tablet  5 mg Oral Daily Pamella Boer, FNP-BC   5 mg at 11/22/23 8119    Correction/SSIP insulin lispro 100 units/mL injection  2-9 Units Subcutaneous Q4HRS Sadiq, Sanah, MD   3 Units at 11/23/23 0115    dextromethorphan-guaiFENesin (ROBITUSSIN DM) 10-100mg  per 5mL oral liquid  10 mL Oral Q4H PRN Rhetta Cellar, MD   10 mL at 11/17/23 2013    dextrose (GLUTOSE) 40% oral gel  15 g Oral Q15 Min PRN Pamella Boer, FNP-BC        dextrose 50% (0.5 g/mL) injection - syringe  12.5 g Intravenous Q15 Min PRN Pamella Boer, FNP-BC   25 mL at 11/17/23 1432    docusate sodium (COLACE) capsule  100 mg  Oral Daily Ghimire, Calvin, MD   100 mg at 11/22/23 0859    escitalopram (LEXAPRO) tablet  5 mg Oral Daily Pamella Boer, FNP-BC   5 mg at 11/22/23 1478    ferrous sulfate 324 mg (65 mg elemental IRON) tablet  324 mg Oral Daily Ghimire, Calvin, MD   324 mg at 11/22/23 0859    [Held by provider] furosemide (LASIX) tablet  20 mg Oral Daily Ghimire, Calvin, MD        gabapentin (NEURONTIN) capsule  100 mg Oral NIGHTLY Arnett Bi, DO   100 mg at 11/22/23 2118    glucagon injection 1 mg  1 mg IntraMUSCULAR Once PRN Smith, Edmond, FNP-BC        insulin glargine 100 units/mL injection  10 Units Subcutaneous Daily Sadiq, Sanah, MD        insulin lispro 100 units/mL injection  8 Units Subcutaneous 3x/day AC Sadiq, Sanah, MD        ipratropium-albuterol 0.5 mg-3 mg(2.5 mg base)/3 mL Solution for Nebulization  3 mL Nebulization Q4H PRN  Pamella Boer, FNP-BC   3 mL at 11/19/23 1220    ipratropium-albuterol 0.5 mg-3 mg(2.5 mg base)/3 mL Solution for Nebulization  3 mL Nebulization 4x/day Ghimire, Calvin, MD   3 mL at 11/22/23 2048    levothyroxine (SYNTHROID) tablet  75 mcg Oral QAM Pamella Boer, FNP-BC   75 mcg at 11/23/23 0545    loperamide (IMODIUM) capsule  2 mg Oral Once PRN Ghimire, Calvin, MD        loratadine (CLARITIN) tablet  10 mg Oral Daily Arnett Bi, DO   10 mg at 11/22/23 0903    magnesium hydroxide (MILK OF MAGNESIA) 400mg  per 5mL oral liquid  30 mL Oral Daily PRN Rhetta Cellar, MD        magnesium oxide (MAG-OX) 400mg  (241.3 mg elemental magnesium) tablet  400 mg Oral 3x/day Rhetta Cellar, MD   400 mg at 11/22/23 2118    methylPREDNISolone sod succ (SOLU-medrol) 40 mg/mL injection  40 mg Intravenous Q12H Ghimire, Calvin, MD   40 mg at 11/22/23 2130    metoprolol tartrate (LOPRESSOR) tablet  12.5 mg Oral 2x/day Rhetta Cellar, MD   12.5 mg at 11/22/23 2117    nystatin (NYSTOP) 100,000 units/g topical powder   Apply Topically 2x/day Rhetta Cellar, MD   Given at 11/22/23 2100    pantoprazole (PROTONIX)  delayed release tablet  40 mg Oral Daily Arnett Bi, DO   40 mg at 11/23/23 0545    perflutren lipid microspheres (DEFINITY) 1.3 mL in NS 10 mL (tot vol) injection  2 mL Intravenous Cardiology Once PRN Rhetta Cellar, MD   3 mL at 11/17/23 1154    pramipexole (MIRAPEX) tablet  0.125 mg Oral 3x/day Pamella Boer, FNP-BC   0.125 mg at 11/22/23 2118    pyridOXINE Vitamin B6 tablet  250 mg Oral 2x/day Rhetta Cellar, MD   250 mg at 11/22/23 2118    ramelteon (ROZEREM) tablet  8 mg Oral NIGHTLY Ghimire, Calvin, MD   8 mg at 11/22/23 1641    zinc oxide 56.7 g, clotrimazole (LOTRIMIN) 15 g, hydrocortisone (CORTIZONE-10) 28 g compounded topical cream   Apply Topically Q30 Min PRN Rhetta Cellar, MD         Objective:  Temperature: 36.8 C (98.2 F)  Heart Rate: (!) 117  BP (Non-Invasive): 126/87  Respiratory Rate: 18  SpO2: 96 %  Full Exam Limited as Encounter is Virtual  General: Chronically-ill appearing female in no acute distress.  Psychiatric: Normal mood and affect  Neuro: Alert and oriented x 3. Speech fluent. Cranial nerves grossly II-XII intact.   HEENT: Head normocephalic, atraumatic. EOMI, conjunctiva clear. No proptosis.  Thyroid/Neck: Trachea midline.   Lungs: Normal respiratory effort.   Extremities: No edema or erythema noted.  Skin: No visible rashes.      Labs:  Reviewed  Lab Results   Component Value Date    HA1C 11.2 (H) 11/17/2023        BMP (Last 24 Hours):    Recent Results last 24 hours     11/22/23  0841   SODIUM 135*   POTASSIUM 4.7   CHLORIDE 93*   CO2 32*   BUN 42*   CREATININE 1.08   CALCIUM 8.8   GLUCOSENF 357*         CBC with Diff (Last 24 Hours):    Recent Results last 24 hours     11/22/23  0841 11/23/23  0614   WBC 6.8 7.0   HGB 12.7 11.8   HCT  39.5 36.2   MCV 96.5* 94.5   PLTCNT 132* 143   PMNS  --  91*   LYMPHOCYTES  --  6*   MONOCYTES  --  3*   EOSINOPHIL  --  0*   BASOPHILS  --  0  0.00         Recent Labs     11/21/23  0710 11/21/23  1037 11/21/23  1521 11/21/23  1951  11/22/23  0724 11/22/23  1121 11/22/23  1501 11/22/23  2125 11/23/23  0106 11/23/23  0548   GLUIP 145* 179* 176* 253* 312* 382* 335* 373* 211* 149*       Lab Results   Component Value Date    TSH 4.336 07/13/2023     Lab Results   Component Value Date    GFR 51 (L) 11/22/2023     Imaging Studies:  Reviewed    Assessment/Recommendations:  Jordan Buckley is a 83 y.o. female with PMH of Alzheimer's dementia, asthma/COPD, CAD, CHF, stroke, osteoporosis, hypothyroidism, pulmonary HTN, hearing loss, HTN, and type II DM admitted for acute hypercapnic respiratory failure secondary to COPD, fluid overload in setting of diastolic CHF, and pneumonia. Endocrinology is consulted for type II diabetes mellitus.     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 Yes; Restrict fluids to: 1200 ML  -Currently on IV methylprednisolone 40 mg BID  -AM sugar improved. Recommend continuing Lantus 8 units qAM  -Mealtime sugars elevated to 200-300s. Recommend increasing Humalog to 14 units TID-AC and adjusting sensitive sliding scale to 4x/day PRN rather than q4h to prevent insulin stacking and risk of hypoglycemia  -Will continue to monitor glucose levels at least 4 times a day and adjust insulin doses as needed    Recommendations discussed with primary team, PRN HOSPITALIST 1.     Thank you for this consult. Will continue to follow. Please page with questions.    On the day of the encounter, a total of 26 minutes were spent on this patient encounter including review of historical information, examination, and documentation.    Jordan Ferron, MD,  MPH 11/23/2023, 07:08

## 2023-11-22 NOTE — Progress Notes (Signed)
 [facility]  IP PROGRESS NOTE      Mercedies, Ganesh  Date of Admission:  11/16/2023  Date of Birth:  [birth year]  Date of Service:  11/22/2023    Hospital Day:  LOS: 5 days       Subjective:   Pt seen and examined for f/u acute respiratory failure, DM.  Very alert this am. Nursing reports wore BiPAP fairly well last night. Family visiting at bedside. Pt has pleasant affect.   Vital Signs:  Temp (24hrs) Max:37.2 C (98.9 F)      Temperature: 36.4 C (97.5 F)  BP (Non-Invasive): 125/74  MAP (Non-Invasive): 86 mmHG  Heart Rate: (!) 115  Respiratory Rate: 17  SpO2: (!) 88 %    Current Medications:  apixaban (ELIQUIS) tablet, 5 mg, Oral, 2x/day  atorvastatin (LIPITOR) tablet, 20 mg, Oral, NIGHTLY  bisacodyl (DULCOLAX) enteric coated tablet, 5 mg, Oral, Q72H PRN  [Held by provider] budesonide-formoterol (SYMBICORT) 80 mcg-4.5 mcg per inhalation oral inhaler - "Respiratory to administer", 1 Puff, Inhalation, Daily  busPIRone (BUSPAR) tablet, 5 mg, Oral, Daily  Correction/SSIP insulin lispro 100 units/mL injection, 2-9 Units, Subcutaneous, 3x/day AC  Correction/SSIP insulin lispro 100 units/mL injection, 1-5 Units, Subcutaneous, NIGHTLY  dextromethorphan-guaiFENesin (ROBITUSSIN DM) 10-100mg  per 5mL oral liquid, 10 mL, Oral, Q4H PRN  dextrose (GLUTOSE) 40% oral gel, 15 g, Oral, Q15 Min PRN  dextrose 50% (0.5 g/mL) injection - syringe, 12.5 g, Intravenous, Q15 Min PRN  docusate sodium (COLACE) capsule, 100 mg, Oral, Daily  escitalopram (LEXAPRO) tablet, 5 mg, Oral, Daily  ferrous sulfate 324 mg (65 mg elemental IRON) tablet, 324 mg, Oral, Daily  [Held by provider] furosemide (LASIX) tablet, 20 mg, Oral, Daily  gabapentin (NEURONTIN) capsule, 100 mg, Oral, NIGHTLY  glucagon injection 1 mg, 1 mg, IntraMUSCULAR, Once PRN  insulin glargine 100 units/mL injection, 8 Units, Subcutaneous, Daily  insulin lispro 100 units/mL injection, 6 Units, Subcutaneous, 3x/day AC  ipratropium-albuterol 0.5 mg-3 mg(2.5 mg base)/3  mL Solution for Nebulization, 3 mL, Nebulization, Q4H PRN  ipratropium-albuterol 0.5 mg-3 mg(2.5 mg base)/3 mL Solution for Nebulization, 3 mL, Nebulization, 4x/day  levothyroxine (SYNTHROID) tablet, 75 mcg, Oral, QAM  loperamide (IMODIUM) capsule, 2 mg, Oral, Once PRN  loratadine (CLARITIN) tablet, 10 mg, Oral, Daily  magnesium hydroxide (MILK OF MAGNESIA) 400mg  per 5mL oral liquid, 30 mL, Oral, Daily PRN  magnesium oxide (MAG-OX) 400mg  (241.3 mg elemental magnesium) tablet, 400 mg, Oral, 3x/day  methylPREDNISolone sod succ (SOLU-medrol) 40 mg/mL injection, 40 mg, Intravenous, Q12H  metoprolol tartrate (LOPRESSOR) tablet, 12.5 mg, Oral, 2x/day  nystatin (NYSTOP) 100,000 units/g topical powder, , Apply Topically, 2x/day  pantoprazole (PROTONIX) delayed release tablet, 40 mg, Oral, Daily  perflutren lipid microspheres (DEFINITY) 1.3 mL in NS 10 mL (tot vol) injection, 2 mL, Intravenous, Cardiology Once PRN  pramipexole (MIRAPEX) tablet, 0.125 mg, Oral, 3x/day  pyridOXINE Vitamin B6 tablet, 250 mg, Oral, 2x/day  ramelteon (ROZEREM) tablet, 8 mg, Oral, NIGHTLY  zinc oxide 56.7 g, clotrimazole (LOTRIMIN) 15 g, hydrocortisone (CORTIZONE-10) 28 g compounded topical cream, , Apply Topically, Q30 Min PRN        Current Orders:  Active Orders   Lab    CBC/DIFF     Frequency: 0530 - AM DRAW     Number of Occurrences: 1 Occurrences    COMPREHENSIVE METABOLIC PANEL, NON-FASTING     Frequency: 0530 - AM DRAW     Number of Occurrences: 1 Occurrences    MAGNESIUM     Frequency: 0530 - AM  DRAW     Number of Occurrences: 1 Occurrences   Diet    DIET DIABETIC Calorie amount: CC 1800; Do you want to initiate MNT Protocol? Yes; Restrict fluids to: 1200 ML     Frequency: All Meals     Number of Occurrences: 1 Occurrences   Nursing    ACTIVITY     Frequency: UNTIL DISCONTINUED     Number of Occurrences: Until Specified    APIXABAN NURSING ORDER     Frequency: UNTIL DISCONTINUED     Number of Occurrences: Until Specified     Order  Comments: Nursing Instructions:    1.  Print apixaban (Eliquis) guide using the link in this order.   2.  Nurse to provide apixaban patient guide to all patients and be sure that all new starts have received education by the trained anticoagulation educator.  If additional questions, contact pharmacy.           APPLY SEQUENTIAL COMPRESSION DEVICE     Frequency: ONE TIME     Number of Occurrences: 1 Occurrences    HYPOGLYCEMIA MANAGEMENT - CONSCIOUS PATIENT W/DIET ORDER     Frequency: UNTIL DISCONTINUED     Number of Occurrences: Until Specified    HYPOGLYCEMIA MANAGEMENT - UNCONSCIOUS/ALTERED/NPO PATIENT     Frequency: UNTIL DISCONTINUED     Number of Occurrences: Until Specified    HYPOGLYCEMIA TREATMENT ALGORITHM     Frequency: UNTIL DISCONTINUED     Number of Occurrences: Until Specified    INCENTIVE SPIROMETRY NURSING     Frequency: Q1H WA     Number of Occurrences: 250 Occurrences    INTAKE AND OUTPUT Q4H     Frequency: Q4H     Number of Occurrences: Until Specified    MAINTAIN SEQUENTIAL COMPRESSION DEVICE     Frequency: CONTINUOUS     Number of Occurrences: Until Specified    NOTIFY MD     Frequency: PRN     Number of Occurrences: Until Specified    Notify MD Vital Signs     Frequency: PRN     Number of Occurrences: Until Specified    ORAL REHYDRATION FLUIDS     Frequency: UNTIL DISCONTINUED     Number of Occurrences: Until Specified    PT IS INTERMEDIATE RISK FOR VENOUS THROMBOEMBOLISM     Frequency: CONTINUOUS     Number of Occurrences: Until Specified    PULSE OXIMETRY Q4H     Frequency: Q4H     Number of Occurrences: Until Specified    TELEMETRY MONITORING - Continuous     Frequency: CONTINUOUS     Number of Occurrences: Until Specified    VITAL SIGNS  Q4H     Frequency: Q4H     Number of Occurrences: Until Specified    WAS PATIENT ON APIXABAN PRIOR TO ADMISSION?     Frequency: UNTIL DISCONTINUED     Number of Occurrences: Until Specified   Code Status    DO NOT ATTEMPT RESUSCITATION (DNAR/DNI/DNR-CC)      Frequency: CONTINUOUS     Number of Occurrences: Until Specified     Order Comments: Do not use mechanical ventilation or intubation in the event of respiratory failure or other medication indication. May consider use of less invasive airway support such as BiPAP.    In the event of a pulseless cardiac arrest, do NOT perform ACLS (advanced cardiac life support) to attempt resuscitation. IE - DO NOT perform chest compressions/defibrillation or use mechanical ventilation or intubation in  the setting of pulseless cardiac arrest.       Consult    IP CONSULT TO CARE MANAGEMENT     Frequency: ONE TIME     Number of Occurrences: 1 Occurrences     Order Comments: Pt is a resident of [facility] . Plans are to send back with hospice referral and BiPAP .      IP CONSULT TO ENDOCRINE/METABOLIC - TELEMEDICINE     Frequency: ONE TIME     Number of Occurrences: 1 Occurrences    IP CONSULT TO PALLIATIVE CARE     Frequency: ONE TIME     Number of Occurrences: 1 Occurrences    IP CONSULT TO PALLIATIVE CARE     Frequency: ONE TIME     Number of Occurrences: 1 Occurrences   Respiratory Care    ACCUPAP SYSTEM     Frequency: Q6H while awake     Number of Occurrences: Until Specified    INCENTIVE SPIROMETRY - RT INSTRUCT     Frequency: ONE TIME     Number of Occurrences: 1 Occurrences    NONINVASIVE POSITIVE PRESSURE VENTILATION PROTOCOL (RT TO DETERMINE SETTINGS)     Frequency: Q4H     Number of Occurrences: Until Specified   Point of Care Testing    PERFORM POC WHOLE BLOOD GLUCOSE     Frequency: TID AC & HS     Number of Occurrences: Until Specified   Medications    apixaban (ELIQUIS) tablet     Frequency: 2x/day     Dose: 5 mg     Route: Oral    atorvastatin (LIPITOR) tablet     Frequency: NIGHTLY     Dose: 20 mg     Route: Oral    bisacodyl (DULCOLAX) enteric coated tablet     Frequency: Q72H PRN     Dose: 5 mg     Route: Oral    budesonide-formoterol (SYMBICORT) 80 mcg-4.5 mcg per inhalation oral inhaler - "Respiratory to administer"      Frequency: Daily     Dose: 1 Puff     Route: Inhalation    busPIRone (BUSPAR) tablet     Frequency: Daily     Dose: 5 mg     Route: Oral    Correction/SSIP insulin lispro 100 units/mL injection     Frequency: 3x/day AC     Dose: 2-9 Units     Route: Subcutaneous    Correction/SSIP insulin lispro 100 units/mL injection     Frequency: NIGHTLY     Dose: 1-5 Units     Route: Subcutaneous    dextromethorphan-guaiFENesin (ROBITUSSIN DM) 10-100mg  per 5mL oral liquid     Frequency: Q4H PRN     Dose: 10 mL     Route: Oral    dextrose (GLUTOSE) 40% oral gel     Frequency: Q15 Min PRN     Dose: 15 g     Route: Oral    dextrose 50% (0.5 g/mL) injection - syringe     Frequency: Q15 Min PRN     Dose: 12.5 g     Route: Intravenous    docusate sodium (COLACE) capsule     Frequency: Daily     Dose: 100 mg     Route: Oral    escitalopram (LEXAPRO) tablet     Frequency: Daily     Dose: 5 mg     Route: Oral    ferrous sulfate 324 mg (65 mg elemental IRON) tablet  Frequency: Daily     Dose: 324 mg     Route: Oral    furosemide (LASIX) tablet     Frequency: Daily     Dose: 20 mg     Route: Oral    gabapentin (NEURONTIN) capsule     Frequency: NIGHTLY     Dose: 100 mg     Route: Oral    glucagon injection 1 mg     Frequency: Once PRN     Dose: 1 mg     Route: IntraMUSCULAR    insulin glargine 100 units/mL injection     Frequency: Daily     Dose: 8 Units     Route: Subcutaneous    insulin lispro 100 units/mL injection     Frequency: 3x/day AC     Dose: 6 Units     Route: Subcutaneous    ipratropium-albuterol 0.5 mg-3 mg(2.5 mg base)/3 mL Solution for Nebulization     Frequency: Q4H PRN     Dose: 3 mL     Route: Nebulization    ipratropium-albuterol 0.5 mg-3 mg(2.5 mg base)/3 mL Solution for Nebulization     Frequency: 4x/day     Dose: 3 mL     Route: Nebulization    levothyroxine (SYNTHROID) tablet     Frequency: QAM     Dose: 75 mcg     Route: Oral    loperamide (IMODIUM) capsule     Frequency: Once PRN     Dose: 2 mg     Route: Oral     loratadine (CLARITIN) tablet     Frequency: Daily     Dose: 10 mg     Route: Oral    magnesium hydroxide (MILK OF MAGNESIA) 400mg  per 5mL oral liquid     Frequency: Daily PRN     Dose: 30 mL     Route: Oral    magnesium oxide (MAG-OX) 400mg  (241.3 mg elemental magnesium) tablet     Frequency: 3x/day     Dose: 400 mg     Route: Oral    methylPREDNISolone sod succ (SOLU-medrol) 40 mg/mL injection     Frequency: Q12H     Dose: 40 mg     Route: Intravenous    metoprolol tartrate (LOPRESSOR) tablet     Frequency: 2x/day     Dose: 12.5 mg     Route: Oral    nystatin (NYSTOP) 100,000 units/g topical powder     Frequency: 2x/day     Route: Apply Topically    pantoprazole (PROTONIX) delayed release tablet     Frequency: Daily     Dose: 40 mg     Route: Oral    perflutren lipid microspheres (DEFINITY) 1.3 mL in NS 10 mL (tot vol) injection     Frequency: Cardiology Once PRN     Dose: 2 mL     Route: Intravenous    pramipexole (MIRAPEX) tablet     Frequency: 3x/day     Dose: 0.125 mg     Route: Oral    pyridOXINE Vitamin B6 tablet     Frequency: 2x/day     Dose: 250 mg     Route: Oral    ramelteon (ROZEREM) tablet     Frequency: NIGHTLY     Dose: 8 mg     Route: Oral    zinc oxide 56.7 g, clotrimazole (LOTRIMIN) 15 g, hydrocortisone (CORTIZONE-10) 28 g compounded topical cream     Frequency: Q30 Min PRN  Route: Apply Topically        Review of Systems:  Limited by pt confusion    Today's Physical Exam:  Physical Exam  Constitutional:       General: She is not in acute distress.     Comments: Chronically ill  Drowsy but arousable  Will interact but is hard of hearing and confused   Eyes:      Pupils: Pupils are equal, round, and reactive to light.   Cardiovascular:      Rate and Rhythm: Normal rate and regular rhythm.   Pulmonary:      Effort: Pulmonary effort is normal. No respiratory distress.      Breath sounds: Normal breath sounds. No wheezing.   Abdominal:      General: Bowel sounds are normal. There is no  distension.      Tenderness: There is no abdominal tenderness.   Musculoskeletal:      Right lower leg: No edema.      Left lower leg: No edema.   Skin:     General: Skin is warm and dry.   Neurological:      Mental Status: She is alert. She is disoriented.              I/O:  I/O last 24 hours:    Intake/Output Summary (Last 24 hours) at 11/22/2023 1206  Last data filed at 11/22/2023 0935  Gross per 24 hour   Intake 440 ml   Output --   Net 440 ml     I/O current shift:  04/13 0700 - 04/13 1859  In: 220 [P.O.:220]  Out: -       Labs  Please indicate ordered or reviewed)  Reviewed: I have reviewed all lab results.  Lab Results Today:    Results for orders placed or performed during the hospital encounter of 11/16/23 (from the past 24 hours)   POC BLOOD GLUCOSE (RESULTS)   Result Value Ref Range    GLUCOSE, POC 176 (H) 70 - 100 mg/dl   BLOOD GAS W/ LACTATE REFLEX Arterial   Result Value Ref Range    %FIO2 (ARTERIAL) 35 %    PH (ARTERIAL) 7.41 7.35 - 7.45    PCO2 (ARTERIAL) 64 (H) 35 - 45 mm/Hg    PO2 (ARTERIAL) 76 (L) 83 - 108 mm/Hg    BICARBONATE (ARTERIAL) 35.0 (H) 21.0 - 28.0 mmol/L    BASE EXCESS (ARTERIAL) 13.0 (H) 0.0 - 3.0 mmol/L    PAO2/FIO2 RATIO 217     O2 SATURATION (ARTERIAL) 95.2 94.0 - 98.0 %    LACTATE 1.0 <=1.9 mmol/L   POC BLOOD GLUCOSE (RESULTS)   Result Value Ref Range    GLUCOSE, POC 253 (H) 70 - 100 mg/dl   POC BLOOD GLUCOSE (RESULTS)   Result Value Ref Range    GLUCOSE, POC 312 (H) 70 - 100 mg/dl   CBC   Result Value Ref Range    WBC 6.8 3.8 - 11.8 x10^3/uL    RBC 4.09 3.63 - 4.92 x10^6/uL    HGB 12.7 10.9 - 14.3 g/dL    HCT 13.2 44.0 - 10.2 %    MCV 96.5 (H) 75.5 - 95.3 fL    MCH 30.9 24.7 - 32.8 pg    MCHC 32.1 (L) 32.3 - 35.6 g/dL    RDW 72.5 36.6 - 44.0 %    PLATELETS 132 (L) 140 - 440 x10^3/uL    MPV 6.9 (L) 7.9 - 10.8 fL  COMPREHENSIVE METABOLIC PANEL, NON-FASTING   Result Value Ref Range    SODIUM 135 (L) 136 - 145 mmol/L    POTASSIUM 4.7 3.5 - 5.1 mmol/L    CHLORIDE 93 (L) 98 - 107 mmol/L     CO2 TOTAL 32 (H) 21 - 31 mmol/L    ANION GAP 10 4 - 13 mmol/L    BUN 42 (H) 7 - 25 mg/dL    CREATININE 1.61 0.96 - 1.30 mg/dL    BUN/CREA RATIO 39 (H) 6 - 22    ESTIMATED GFR 51 (L) >59 mL/min/1.18m^2    ALBUMIN 3.7 3.5 - 5.7 g/dL    CALCIUM 8.8 8.6 - 04.5 mg/dL    GLUCOSE 409 (H) 74 - 109 mg/dL    ALKALINE PHOSPHATASE 61 34 - 104 U/L    ALT (SGPT) 62 (H) 7 - 52 U/L    AST (SGOT) 46 (H) 13 - 39 U/L    BILIRUBIN TOTAL 1.1 (H) 0.3 - 1.0 mg/dL    PROTEIN TOTAL 6.6 6.4 - 8.9 g/dL    ALBUMIN/GLOBULIN RATIO 1.3 0.8 - 1.4    OSMOLALITY, CALCULATED 295 (H) 270 - 290 mOsm/kg    CALCIUM, CORRECTED 9.0 8.9 - 10.8 mg/dL    GLOBULIN 2.9 2.0 - 3.5   POC BLOOD GLUCOSE (RESULTS)   Result Value Ref Range    GLUCOSE, POC 382 (H) 70 - 100 mg/dl           Problem List:  Active Hospital Problems   (*Primary Problem)    Diagnosis    *Fluid overload    Moderate pulmonary hypertension (CMS HCC)    Bacteriuria    Systolic heart failure, unspecified HF chronicity (CMS HCC)    COPD (chronic obstructive pulmonary disease)    CVA (cerebrovascular accident)     History      Depression    Metabolic encephalopathy    Alzheimer's disease, unspecified (CODE)    Diabetes mellitus, type 2     Chronic    Coronary artery disease    Hypothyroidism       Nutrition:    DIET DIABETIC Calorie amount: CC 1800; Do you want to initiate MNT Protocol? Yes; Restrict fluids to: 1200 ML    Additional clinical characteristics related to nutrition:    - monitor for weight changes   - monitor intake and output    - monitor bowel functions                  Assessment/ Plan:   Fluid overload-resolved./acute diastolic heart failure-resolved.  Hypoglycemia  Metabolic encephalopathy  Chronic obstructive pulmonary disease  Acute hypercapnic respiratory failure  History of CVA  Dementia  Diabetes type 2-poorly controlled.  History of CAD  Hypothyroidism  Depression  Hyperkalemia-resolved  Urinary tract infection-proteus mirabilis    11/21/23-pt received dose of fosfomycin x  1 per ID recommendations for UTI. Has hx recurrent pna and is now admitted for acute respiratory failure, fluid overload. Has chronic dementia but is able to interact with her family at times to discuss goals of care. Family met with palliative care yesterday and was interested in services, however, after consideration, they do not feel palliative care will provide any additional benefits that she is not currently receiving at SNF. Mpoa is present during exam and states pt has repeatedly indicated she is tired and wants to go home (indicating snf). They acknowledge chronic medical illnesses and their desire to offer quality of life to the pt and would like  to pursue hospice at snf. Pt is DNR/DNI. Pt is not CMO. Family understands that medical treatment will continue until hospice arrangements are made at SNF. As such, will avoid sedating meds and encourage compliance with BiPAP as tolerated. Continue medical tx as otherwise outlined in orders.    11/22/23-plan to proceed with hospice at [facility] tomorrow upon return of case management department. Family agreeable to plan.    Further recommendations depending on clinical course.  The Hospitalist personally evaluated and examined the patient in conjunction with the MLP and agree with the assessments, treatment plan and disposition of the patient as recorded by the [facility].    Leodis Rainwater, MSPA, [facility]  [facility]    DVT/PE Prophylaxis: Apixaban

## 2023-11-22 NOTE — Care Management Notes (Signed)
 Spoke with Joline Ned about hospice referral. Pt has been accepted but requested BiPAP can not be done until 11/22/23 due to it needing fitted.

## 2023-11-22 NOTE — Respiratory Therapy (Signed)
 Attempted under the nose mask for patient's BiPAP. Patient continues to refuse.

## 2023-11-23 DIAGNOSIS — T380X5A Adverse effect of glucocorticoids and synthetic analogues, initial encounter: Secondary | ICD-10-CM

## 2023-11-23 DIAGNOSIS — Z7952 Long term (current) use of systemic steroids: Secondary | ICD-10-CM

## 2023-11-23 DIAGNOSIS — I272 Pulmonary hypertension, unspecified: Secondary | ICD-10-CM

## 2023-11-23 LAB — CBC WITH DIFF
BASOPHIL #: 0 10*3/uL (ref 0.00–0.10)
BASOPHIL %: 0 % (ref 0–1)
EOSINOPHIL #: 0 10*3/uL (ref 0.00–0.50)
EOSINOPHIL %: 0 % — ABNORMAL LOW (ref 1–7)
HCT: 36.2 % (ref 31.2–41.9)
HGB: 11.8 g/dL (ref 10.9–14.3)
LYMPHOCYTE #: 0.4 10*3/uL — ABNORMAL LOW (ref 1.10–3.10)
LYMPHOCYTE %: 6 % — ABNORMAL LOW (ref 16–46)
MCH: 30.7 pg (ref 24.7–32.8)
MCHC: 32.5 g/dL (ref 32.3–35.6)
MCV: 94.5 fL (ref 75.5–95.3)
MONOCYTE #: 0.2 10*3/uL (ref 0.20–0.90)
MONOCYTE %: 3 % — ABNORMAL LOW (ref 4–11)
MPV: 6.9 fL — ABNORMAL LOW (ref 7.9–10.8)
NEUTROPHIL #: 6.4 10*3/uL (ref 1.90–8.20)
NEUTROPHIL %: 91 % — ABNORMAL HIGH (ref 43–77)
PLATELETS: 143 10*3/uL (ref 140–440)
RBC: 3.83 10*6/uL (ref 3.63–4.92)
RDW: 15.4 % (ref 12.3–17.7)
WBC: 7 10*3/uL (ref 3.8–11.8)

## 2023-11-23 LAB — POC BLOOD GLUCOSE (RESULTS)
GLUCOSE, POC: 211 mg/dL — ABNORMAL HIGH (ref 70–100)
GLUCOSE, POC: 149 mg/dL — ABNORMAL HIGH (ref 70–100)
GLUCOSE, POC: 161 mg/dL — ABNORMAL HIGH (ref 70–100)
GLUCOSE, POC: 276 mg/dL — ABNORMAL HIGH (ref 70–100)
GLUCOSE, POC: 116 mg/dL — ABNORMAL HIGH (ref 70–100)

## 2023-11-23 LAB — MAGNESIUM: MAGNESIUM: 2.5 mg/dL (ref 1.9–2.7)

## 2023-11-23 LAB — COMPREHENSIVE METABOLIC PANEL, NON-FASTING
ALBUMIN/GLOBULIN RATIO: 1.3 (ref 0.8–1.4)
ALBUMIN: 3.4 g/dL — ABNORMAL LOW (ref 3.5–5.7)
ALKALINE PHOSPHATASE: 56 U/L (ref 34–104)
ALT (SGPT): 53 U/L — ABNORMAL HIGH (ref 7–52)
ANION GAP: 7 mmol/L (ref 4–13)
AST (SGOT): 37 U/L (ref 13–39)
BILIRUBIN TOTAL: 0.9 mg/dL (ref 0.3–1.0)
BUN/CREA RATIO: 40 — ABNORMAL HIGH (ref 6–22)
BUN: 38 mg/dL — ABNORMAL HIGH (ref 7–25)
CALCIUM, CORRECTED: 9.2 mg/dL (ref 8.9–10.8)
CALCIUM: 8.7 mg/dL (ref 8.6–10.3)
CHLORIDE: 94 mmol/L — ABNORMAL LOW (ref 98–107)
CO2 TOTAL: 38 mmol/L — ABNORMAL HIGH (ref 21–31)
CREATININE: 0.94 mg/dL (ref 0.60–1.30)
ESTIMATED GFR: 60 mL/min/{1.73_m2} (ref >59)
GLOBULIN: 2.6 (ref 2.0–3.5)
GLUCOSE: 155 mg/dL — ABNORMAL HIGH (ref 74–109)
OSMOLALITY, CALCULATED: 290 mosm/kg (ref 270–290)
POTASSIUM: 4.8 mmol/L (ref 3.5–5.1)
PROTEIN TOTAL: 6 g/dL — ABNORMAL LOW (ref 6.4–8.9)
SODIUM: 139 mmol/L (ref 136–145)

## 2023-11-23 LAB — SCAN DIFFERENTIAL: PLATELET MORPHOLOGY COMMENT: NORMAL

## 2023-11-23 MED ORDER — INSULIN LISPRO 100 UNIT/ML SUB-Q - CHARGE BY DOSE
14.0000 [IU] | Freq: Three times a day (TID) | SUBCUTANEOUS | Status: DC
Start: 2023-11-23 — End: 2023-11-23
  Administered 2023-11-23 (×2): 14 [IU] via SUBCUTANEOUS
  Administered 2023-11-23: 0 [IU] via SUBCUTANEOUS
  Filled 2023-11-23 (×2): qty 14

## 2023-11-23 MED ORDER — INSULIN LISPRO 100 UNIT/ML SUB-Q SSIP VIAL
2.0000 [IU] | INJECTION | Freq: Four times a day (QID) | SUBCUTANEOUS | Status: DC
Start: 2023-11-23 — End: 2023-11-23
  Administered 2023-11-23: 5 [IU] via SUBCUTANEOUS
  Administered 2023-11-23: 0 [IU] via SUBCUTANEOUS
  Filled 2023-11-23: qty 5

## 2023-11-23 MED ORDER — METOPROLOL TARTRATE 25 MG TABLET
12.5000 mg | ORAL_TABLET | Freq: Two times a day (BID) | ORAL | Status: AC
Start: 2023-11-23 — End: 2023-12-23

## 2023-11-23 MED ORDER — INSULIN GLARGINE 100 UNITS/ML SUBQ - CHARGE BY DOSE
8.0000 [IU] | Freq: Every day | SUBCUTANEOUS | Status: DC
Start: 2023-11-23 — End: 2023-11-23
  Administered 2023-11-23: 8 [IU] via SUBCUTANEOUS
  Filled 2023-11-23: qty 8

## 2023-11-23 NOTE — Respiratory Therapy (Signed)
 11/23/23 0200   Non-Invasive Ventilation Assessment   Start Time 0242   Orders Updated in EMR Yes   $ NIV Check S   NIV Status Patient Refused      Patient continues to refuse BiPAP.

## 2023-11-23 NOTE — Nurses Notes (Signed)
 Report called to Peterson Brandt, LPN at [facility]. Transport also scheduled with [facility] at this time.

## 2023-11-23 NOTE — Nurses Notes (Signed)
 Patient discharged to [facility] via [facility].  AVS reviewed with patient/care giver.  A written copy of the AVS and discharge instructions was given to the patient/care giver. Scripts handed to patient/care giver. Questions sufficiently answered as needed.  Patient/care giver encouraged to follow up with PCP as indicated.  In the event of an emergency, patient/care giver instructed to call 911 or go to the nearest emergency room. IV and Telemetry removed. Family aware of patient transfer.

## 2023-11-23 NOTE — Discharge Summary (Signed)
 [facility]     DISCHARGE SUMMARY      PATIENT NAME:  Jordan Buckley   MRN:  [id number]  DOB:  1941/05/22    INPATIENT ADMISSION DATE: 11/16/2023   DATE OF DISCHARGE:  11/23/23     ATTENDING PHYSICIAN:  Ebbie Ridge, DO    HOSPITAL PRESENTATION:  Please see full admission H&P for details.    As per HPI:  Jordan Buckley is a 83 y.o., White female who presents with increased shortness of breath.  Patient was seen and examined at bedside.  Patient was brought to the ED by EMS from a local nursing home due to cough, congestion, and increased shortness of breath.  Family said that patient has had recurrent pneumonia was treated with Augmentin, then patient had an allergic reaction and then was switched to another antibiotic.  Patient is taken to antibiotics with no improvement.  Today in the ED family noticed the patient was extremely short of breath.  Patient is not normally O2 dependent but she has been having to use O2 pretty much all the time anywhere from 2-4 L via NC.  Patient has never been diagnosed with CHF however patient is edematous and has an elevated BNP of 210.  Patient does have a history of a cardiac surgery as well as chronic obstructive pulmonary disease and asthma.  Patient has a poor historian.  Patient denies any other complaints at this time.  Patient will be admitted to hospitalist services for further evaluation.     FURTHER HOSPITAL COURSE:  Patient was admitted with and treated for :  Fluid overload-resolved./acute diastolic heart failure-resolved.  Hypoglycemia  Metabolic encephalopathy  Chronic obstructive pulmonary disease  Acute hypercapnic respiratory failure  History of CVA  Dementia  Diabetes type 2-poorly controlled.  History of CAD  Hypothyroidism  Depression  Hyperkalemia-resolved  Urinary tract infection-proteus mirabilis     On 11/21/23-pt received dose of fosfomycin x 1 per ID recommendations for UTI. Has hx recurrent pna and is now admitted for acute  respiratory failure, fluid overload. Has chronic dementia but is able to interact with her family at times to discuss goals of care. Family met with palliative care yesterday and was interested in services, however, after consideration, they do not feel palliative care will provide any additional benefits that she is not currently receiving at SNF. Mpoa is present during exam and states pt has repeatedly indicated she is tired and wants to go home (indicating snf). They acknowledge chronic medical illnesses and their desire to offer quality of life to the pt and would like to pursue hospice at snf. Pt is DNR/DNI. Pt is not CMO. Family understands that medical treatment will continue until hospice arrangements are made at SNF. As such, will avoid sedating meds and encourage compliance with BiPAP as tolerated. Continue medical tx as otherwise outlined in orders.  Patient's care has continued. Patient to be discharged to SNF facility with hospice once all needs arranged. Follow up with hospice as directed by them. Continue BiPAP for comfort per current settings. 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     Active Non-Hospital Problems    Diagnosis Date Noted    Urinary tract infection 08/13/2022    DKA  (diabetic ketoacidosis) 08/11/2022    Fall 08/11/2022    Pseudohyponatremia 08/11/2022    Hyperglycemia 01/17/2022    Hyperkalemia 12/23/2021    AKI (acute kidney injury) (CMS HCC) 12/23/2021    Anemia, unspecified 12/23/2021    Need for assistance with personal care 12/23/2021    Abnormal posture 12/21/2021    Other abnormalities of gait and mobility 12/21/2021    Other lack of coordination 12/21/2021    Closed compression fracture of L1 vertebra (CMS HCC) 12/20/2021    Age-related osteoporosis without current pathological fracture 12/20/2021    Dysphagia, oral phase 12/20/2021    Hypothyroidism, unspecified 12/20/2021    Muscle weakness (generalized) 12/20/2021    Wedge compression fracture of first lumbar vertebra, subsequent encounter for fracture with routine healing 12/20/2021    Vertebrogenic low back pain 12/20/2021    Unspecified dementia, unspecified severity, without behavioral disturbance, psychotic disturbance, mood disturbance, and anxiety 12/20/2021    Dementia 12/17/2021    Anticoagulant long-term use 12/17/2021    Chest pain 12/16/2021    Back arthralgia 12/16/2021    Weakness 12/16/2021    Vitamin D deficiency 10/31/2020    Type 2 diabetes mellitus with hyperglycemia 10/31/2020    Diabetes mellitus 10/31/2020    Osteoarthrosis 04/27/2002    Osteoporosis 04/27/2002     Nutrition:    DIET DIABETIC Calorie amount: CC 1800; Do you want to initiate MNT Protocol? Yes; Restrict fluids to: 1200 ML    Additional clinical characteristics related to nutrition:    - monitor for weight changes   - monitor intake and output    - monitor bowel functions        PHYSICAL EXAM DAY OF DISCHARGE:  BP 120/61   Pulse (!) 116   Temp 36.6 C (97.9 F)   Resp 20   Ht 1.626 m (5\' 4" )   Wt 68.5 kg (151 lb 0.8 oz)   SpO2 96%   BMI 25.93 kg/m      Physical Exam  Constitutional:       General: She is not in acute distress.  Cardiovascular:      Rate and Rhythm: Normal rate and regular rhythm.   Pulmonary:      Effort:  Pulmonary effort is normal. No respiratory distress.      Breath sounds: Normal breath sounds. No wheezing.   Abdominal:      General: There is no distension.      Palpations: Abdomen is soft.      Tenderness: There is no abdominal tenderness.   Skin:     General: Skin is warm and dry.   Neurological:      General: No focal deficit present.      Mental Status: She is alert.       LABS WITHIN LAST 24 HOURS:   Results for orders placed or performed during the hospital encounter of 11/16/23 (from the past 24 hours)   POC BLOOD GLUCOSE (RESULTS)   Result Value Ref Range  GLUCOSE, POC 373 (H) 70 - 100 mg/dl   POC BLOOD GLUCOSE (RESULTS)   Result Value Ref Range    GLUCOSE, POC 211 (H) 70 - 100 mg/dl   POC BLOOD GLUCOSE (RESULTS)   Result Value Ref Range    GLUCOSE, POC 149 (H) 70 - 100 mg/dl   COMPREHENSIVE METABOLIC PANEL, NON-FASTING   Result Value Ref Range    SODIUM 139 136 - 145 mmol/L    POTASSIUM 4.8 3.5 - 5.1 mmol/L    CHLORIDE 94 (L) 98 - 107 mmol/L    CO2 TOTAL 38 (H) 21 - 31 mmol/L    ANION GAP 7 4 - 13 mmol/L    BUN 38 (H) 7 - 25 mg/dL    CREATININE 6.29 5.28 - 1.30 mg/dL    BUN/CREA RATIO 40 (H) 6 - 22    ESTIMATED GFR 60 >59 mL/min/1.51m^2    ALBUMIN 3.4 (L) 3.5 - 5.7 g/dL    CALCIUM 8.7 8.6 - 41.3 mg/dL    GLUCOSE 244 (H) 74 - 109 mg/dL    ALKALINE PHOSPHATASE 56 34 - 104 U/L    ALT (SGPT) 53 (H) 7 - 52 U/L    AST (SGOT) 37 13 - 39 U/L    BILIRUBIN TOTAL 0.9 0.3 - 1.0 mg/dL    PROTEIN TOTAL 6.0 (L) 6.4 - 8.9 g/dL    ALBUMIN/GLOBULIN RATIO 1.3 0.8 - 1.4    OSMOLALITY, CALCULATED 290 270 - 290 mOsm/kg    CALCIUM, CORRECTED 9.2 8.9 - 10.8 mg/dL    GLOBULIN 2.6 2.0 - 3.5   MAGNESIUM   Result Value Ref Range    MAGNESIUM 2.5 1.9 - 2.7 mg/dL   CBC WITH DIFF   Result Value Ref Range    WBC 7.0 3.8 - 11.8 x10^3/uL    RBC 3.83 3.63 - 4.92 x10^6/uL    HGB 11.8 10.9 - 14.3 g/dL    HCT 01.0 27.2 - 53.6 %    MCV 94.5 75.5 - 95.3 fL    MCH 30.7 24.7 - 32.8 pg    MCHC 32.5 32.3 - 35.6 g/dL    RDW 64.4 03.4 - 74.2 %     PLATELETS 143 140 - 440 x10^3/uL    MPV 6.9 (L) 7.9 - 10.8 fL    NEUTROPHIL % 91 (H) 43 - 77 %    LYMPHOCYTE % 6 (L) 16 - 46 %    MONOCYTE % 3 (L) 4 - 11 %    EOSINOPHIL % 0 (L) 1 - 7 %    BASOPHIL % 0 0 - 1 %    NEUTROPHIL # 6.40 1.90 - 8.20 x10^3/uL    LYMPHOCYTE # 0.40 (L) 1.10 - 3.10 x10^3/uL    MONOCYTE # 0.20 0.20 - 0.90 x10^3/uL    EOSINOPHIL # 0.00 0.00 - 0.50 x10^3/uL    BASOPHIL # 0.00 0.00 - 0.10 x10^3/uL   SCAN DIFFERENTIAL   Result Value Ref Range    ANISOCYTOSIS 1+ (10-25%)     POIKILOCYTOSIS 1+ (10-25%)     PLATELET MORPHOLOGY COMMENT Normal    POC BLOOD GLUCOSE (RESULTS)   Result Value Ref Range    GLUCOSE, POC 161 (H) 70 - 100 mg/dl   POC BLOOD GLUCOSE (RESULTS)   Result Value Ref Range    GLUCOSE, POC 276 (H) 70 - 100 mg/dl        IMAGING WITHIN LAST 24 HOURS:   No results found.  XR AP MOBILE CHEST   Final Result  SHALLOW INSPIRATION LIMITS THE EXAM.  NO ACUTE FINDINGS.         Radiologist location ID: [id number]         US ABDOMEN (FOR ASCITES)   Final Result   No ascites is noted on the current study.         Radiologist location ID: [id number]         XR AP MOBILE CHEST   Final Result   NO ACUTE FINDINGS.                  Radiologist location ID: [id number]              MICROBIOLOGY WITHIN LAST 24 HOURS:   No results found for any visits on 11/16/23 (from the past 24 hours).     DISCHARGE MEDICATIONS:     Current Discharge Medication List        START taking these medications.        Details   metoprolol tartrate 25 mg Tablet  Commonly known as: LOPRESSOR   12.5 mg, Oral, 2 TIMES DAILY  Refills: 0            CONTINUE these medications - NO CHANGES were made during your visit.        Details   acetaminophen 325 mg Tablet  Commonly known as: TYLENOL   650 mg, EVERY 4 HOURS PRN  Refills: 0     apixaban 5 mg Tablet  Commonly known as: ELIQUIS   5 mg, 2 TIMES DAILY  Refills: 0     atorvastatin 20 mg Tablet  Commonly known as: LIPITOR   20 mg, NIGHTLY  Refills: 0     baclofen 5 mg  Tablet  Commonly known as: LIORESAL   5 mg, 3 TIMES DAILY PRN  Refills: 0     bisacodyL 5 mg Tablet, Delayed Release (E.C.)  Commonly known as: DULCOLAX   15 mg, EVERY 72 HOURS PRN  Refills: 0     budesonide-formoteroL 80-4.5 mcg/actuation oral inhaler  Commonly known as: SYMBICORT   1 Puff, Daily  Refills: 0     busPIRone 5 mg Tablet  Commonly known as: BUSPAR   Take 1 Tablet (5 mg total) by mouth Twice daily  Refills: 0     Calcium 600 + D 600 mg-10 mcg (400 unit) Tablet  Generic drug: calcium carbonate-vitamin D3   1 Tablet, 2 TIMES DAILY  Refills: 0     Cranberry 450 mg Tablet  Generic drug: cranberry fruit   450 mg, Oral, Daily  Refills: 0     docusate sodium 100 mg Capsule  Commonly known as: COLACE   100 mg, Oral, Daily  Qty: 1 Capsule  Refills: 0     ergocalciferol (vitamin D2) 1,250 mcg (50,000 unit) Capsule  Commonly known as: DRISDOL   Take 1 Capsule (50,000 Units total) by mouth On the first of the month  Refills: 0     escitalopram oxalate 5 mg Tablet  Commonly known as: LEXAPRO   Take 1 Tablet (5 mg total) by mouth Daily  Refills: 0     ferrous sulfate 325 mg (65 mg iron) Tablet  Commonly known as: FEOSOL   325 mg, Oral, Daily  Qty: 30 Tablet  Refills: 1     gabapentin 100 mg Capsule  Commonly known as: NEURONTIN   100 mg, NIGHTLY  Refills: 0     HYDROcodone-acetaminophen 5-325 mg Tablet  Commonly known as: NORCO   1 Tablet, 3  TIMES DAILY PRN  Refills: 0     hydrocortisone acetate 25 mg Suppository  Commonly known as: ANUSOL-HC   25 mg, 4 TIMES DAILY PRN  Refills: 0     * insulin glargine 100 unit/mL injection (vial)   18 Units, EVERY MORNING  Refills: 0     * insulin glargine 100 unit/mL injection (vial)   8 Units, NIGHTLY  Refills: 0     insulin lispro 100 unit/mL Solution   3 TIMES DAILY WITH MEALS  Refills: 0     ipratropium-albuteroL 0.5 mg-3 mg(2.5 mg base)/3 mL nebulizer solution  Commonly known as: DUONEB   3 mL, 3 TIMES DAILY  Refills: 0     Januvia 100 mg Tablet  Generic drug: SITagliptin  phosphate   Take 1 Tablet (100 mg total) by mouth Daily  Refills: 0     levothyroxine 75 mcg Tablet  Commonly known as: SYNTHROID   75 mcg, EVERY MORNING  Refills: 0     loperamide 2 mg Capsule  Commonly known as: IMODIUM   2 mg, ONCE PRN  Refills: 0     loratadine 10 mg Tablet  Commonly known as: CLARITIN   10 mg, Daily  Refills: 0     magnesium oxide 400 mg Tablet  Commonly known as: MAG-OX   400 mg, 3 TIMES DAILY  Refills: 0     Milk of Magnesia 400 mg/5 mL Suspension  Generic drug: magnesium hydroxide   30 mL, DAILY PRN  Refills: 0     mineral oil Enema  Commonly known as: FLEET   133 mL, EVERY 72 HOURS PRN  Refills: 0     omeprazole 20 mg Capsule, Delayed Release(E.C.)  Commonly known as: PRILOSEC   20 mg, Daily  Refills: 0     ondansetron 4 mg Tablet, Rapid Dissolve  Commonly known as: ZOFRAN ODT   4 mg, Oral, EVERY 8 HOURS PRN  Qty: 12 Tablet  Refills: 0     phenol 1.4% 1.4 % Aerosol, Spray  Commonly known as: CHLORASEPTIC   2 Sprays, ONCE PRN  Refills: 0     phenyleph-min oil-petrolatum 0.25-14-74.9 % Ointment  Commonly known as: PREPARATION H   ONCE PRN  Refills: 0     polyethylene glycol 400 1 % Drops  Commonly known as: VISINE DRY EYE RELIEF   1 Drop, 2 TIMES DAILY  Refills: 0     * pramipexole 1 mg Tablet  Commonly known as: MIRAPEX   1 mg, NIGHTLY  Refills: 0     * pramipexole 0.125 mg Tablet  Commonly known as: MIRAPEX   0.125 mg, 3 TIMES DAILY  Refills: 0     Pyridoxine 250 mg Tablet  Commonly known as: VITAMIN B6   250 mg, 2 TIMES DAILY  Refills: 0     Robafen DM Cough 10-100 mg/5 mL Liquid  Generic drug: dextromethorphan-guaiFENesin   10 mL, EVERY 4 HOURS PRN  Refills: 0     RULOX ORAL   20 mL, EVERY 4 HOURS PRN  Refills: 0     sennosides-docusate sodium 8.6-50 mg Tablet  Commonly known as: SENOKOT-S   1 Tablet, NIGHTLY  Refills: 0     simethicone 80 mg Tablet, Chewable  Commonly known as: MYLICON   80 mg, 3 TIMES DAILY  Refills: 0     Sweet Oil Oil  Generic drug: Olive Oil   1 Drop,  NIGHTLY  Refills: 0           *  This list has 4 medication(s) that are the same as other medications prescribed for you. Read the directions carefully, and ask your doctor or other care provider to review them with you.                STOP taking these medications.      furosemide 20 mg Tablet  Commonly known as: LASIX             DISCHARGE DISPOSITION:  skilled nursing facility with hospice.     DISCHARGE INSTRUCTIONS:     DME - HOME PAP (BIPAP/CPAP) DEVICE OR SUPPLIES (POSITIVE AIRWAY PRESSURE DEVICE)     Expected Treatment Duration: 99 months = lifetime    Select one: New Patient    Sleep Therapy (Ramp set to patient comfort on any PAP device unless otherwise ordered) BIPAP (E0470)    IPAP Pressure (cmH2O): 16    EPAP Pressure (cmH2O): 8    Mask Interface (choose only one - Substitution Permitted): Full Face Mask (1 per 3 months) (A7030) & Full Face Mask Cushion (1 per month) (A7031)    Company may grant access to compliance monitoring software: Yes    The primary diagnosis as discussed in the face to face encounter justifying the need for home health services/DME is as follows: 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    Specialty: PHYSICIAN ASSISTANT  Why: D/C Hospice  Contact information:  [address]  [place] [address]  [phone]               Copies sent to Care Team         Relationship Specialty Notifications Start End    Vernell Barrier, [place] PCP - General PHYSICIAN ASSISTANT  [birth year]     Phone: [phone] Fax: [phone]         [address] [place] [address]          The hospitalist examined patient, reviewed material, and agreed with discharge at this time.   >30 minutes total were spent coordinating discharge day today    Maxine Glenn, FNP-BC  [facility] MEDICINE HOSPITALIST

## 2023-12-10 DEATH — deceased

## 2024-01-08 ENCOUNTER — Ambulatory Visit (INDEPENDENT_AMBULATORY_CARE_PROVIDER_SITE_OTHER): Payer: Self-pay | Admitting: UROLOGY

## 2024-05-12 ENCOUNTER — Ambulatory Visit (INDEPENDENT_AMBULATORY_CARE_PROVIDER_SITE_OTHER): Payer: Self-pay | Admitting: NEUROLOGY
